# Patient Record
Sex: Female | Born: 1945
Health system: Southern US, Community
[De-identification: ages and names within clinical notes are randomized; demographics above are authoritative.]

## PROBLEM LIST (undated history)

## (undated) DIAGNOSIS — F4322 Adjustment disorder with anxiety: Secondary | ICD-10-CM

## (undated) DIAGNOSIS — J449 Chronic obstructive pulmonary disease, unspecified: Secondary | ICD-10-CM

## (undated) DIAGNOSIS — I2510 Atherosclerotic heart disease of native coronary artery without angina pectoris: Secondary | ICD-10-CM

## (undated) DIAGNOSIS — I10 Essential (primary) hypertension: Secondary | ICD-10-CM

## (undated) DIAGNOSIS — K7469 Other cirrhosis of liver: Secondary | ICD-10-CM

## (undated) DIAGNOSIS — E11610 Type 2 diabetes mellitus with diabetic neuropathic arthropathy: Secondary | ICD-10-CM

## (undated) DIAGNOSIS — R0902 Hypoxemia: Secondary | ICD-10-CM

## (undated) DIAGNOSIS — J45909 Unspecified asthma, uncomplicated: Secondary | ICD-10-CM

## (undated) DIAGNOSIS — I4891 Unspecified atrial fibrillation: Secondary | ICD-10-CM

## (undated) DIAGNOSIS — R002 Palpitations: Secondary | ICD-10-CM

## (undated) DIAGNOSIS — E1165 Type 2 diabetes mellitus with hyperglycemia: Secondary | ICD-10-CM

## (undated) DIAGNOSIS — IMO0001 Reserved for inherently not codable concepts without codable children: Secondary | ICD-10-CM

## (undated) HISTORY — PX: CORONARY ARTERY BYPASS GRAFT: SHX141

## (undated) HISTORY — DX: Reserved for inherently not codable concepts without codable children: IMO0001

## (undated) HISTORY — DX: Type 2 diabetes mellitus with hyperglycemia: E11.65

## (undated) HISTORY — DX: Essential (primary) hypertension: I10

## (undated) HISTORY — DX: Adjustment disorder with anxiety: F43.22

## (undated) HISTORY — DX: Type 2 diabetes mellitus with diabetic neuropathic arthropathy: E11.610

## (undated) HISTORY — PX: TONSILLECTOMY: SUR1361

## (undated) HISTORY — PX: CARDIAC SURGERY: SHX584

---
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
## 2011-07-16 ENCOUNTER — Emergency Department (HOSPITAL_COMMUNITY): Payer: Medicaid Other

## 2011-07-16 ENCOUNTER — Encounter (HOSPITAL_COMMUNITY): Payer: Self-pay | Admitting: *Deleted

## 2011-07-16 ENCOUNTER — Emergency Department (HOSPITAL_COMMUNITY)
Admission: EM | Admit: 2011-07-16 | Discharge: 2011-07-16 | Disposition: A | Discharge status: Home / Self Care | Payer: Medicaid Other | Attending: Emergency Medicine | Admitting: Emergency Medicine

## 2011-07-16 DIAGNOSIS — Z951 Presence of aortocoronary bypass graft: Secondary | ICD-10-CM | POA: Insufficient documentation

## 2011-07-16 DIAGNOSIS — I2510 Atherosclerotic heart disease of native coronary artery without angina pectoris: Secondary | ICD-10-CM | POA: Insufficient documentation

## 2011-07-16 DIAGNOSIS — J45909 Unspecified asthma, uncomplicated: Secondary | ICD-10-CM | POA: Insufficient documentation

## 2011-07-16 DIAGNOSIS — K5289 Other specified noninfective gastroenteritis and colitis: Secondary | ICD-10-CM | POA: Insufficient documentation

## 2011-07-16 DIAGNOSIS — E119 Type 2 diabetes mellitus without complications: Secondary | ICD-10-CM | POA: Insufficient documentation

## 2011-07-16 DIAGNOSIS — I10 Essential (primary) hypertension: Secondary | ICD-10-CM | POA: Insufficient documentation

## 2011-07-16 HISTORY — DX: Unspecified asthma, uncomplicated: J45.909

## 2011-07-16 HISTORY — DX: Palpitations: R00.2

## 2011-07-16 HISTORY — DX: Atherosclerotic heart disease of native coronary artery without angina pectoris: I25.10

## 2011-07-16 LAB — COMPREHENSIVE METABOLIC PANEL
ALT: 120 U/L — ABNORMAL HIGH (ref 0–35)
AST: 93 U/L — ABNORMAL HIGH (ref 0–37)
Albumin: 3.8 g/dL (ref 3.5–5.2)
Alkaline Phosphatase: 87 U/L (ref 39–117)
Calcium: 9.6 mg/dL (ref 8.4–10.5)
GFR calc Af Amer: 79 mL/min — ABNORMAL LOW (ref >90)
Potassium: 4 mEq/L (ref 3.5–5.1)
Sodium: 139 mEq/L (ref 135–145)
Total Protein: 7.8 g/dL (ref 6.0–8.3)

## 2011-07-16 LAB — URINALYSIS, ROUTINE W REFLEX MICROSCOPIC
Bilirubin Urine: NEGATIVE
Ketones, ur: NEGATIVE mg/dL
Leukocytes, UA: NEGATIVE
Nitrite: NEGATIVE
Specific Gravity, Urine: 1.022 (ref 1.005–1.030)
Urobilinogen, UA: 0.2 mg/dL (ref 0.0–1.0)
pH: 5.5 (ref 5.0–8.0)

## 2011-07-16 LAB — CBC
MCH: 26.9 pg (ref 26.0–34.0)
MCHC: 33.7 g/dL (ref 30.0–36.0)
Platelets: 170 10*3/uL (ref 150–400)
RDW: 14.4 % (ref 11.5–15.5)

## 2011-07-16 LAB — URINE MICROSCOPIC-ADD ON

## 2011-07-16 MED ORDER — IOHEXOL 300 MG/ML  SOLN
80.0000 mL | Freq: Once | INTRAMUSCULAR | Status: AC | PRN
  Administered 2011-07-16: 80 mL via INTRAVENOUS

## 2011-07-16 MED ORDER — MORPHINE SULFATE 4 MG/ML IJ SOLN
4.0000 mg | Freq: Once | INTRAMUSCULAR | Status: AC
  Administered 2011-07-16: 4 mg via INTRAVENOUS
  Filled 2011-07-16: qty 1

## 2011-07-16 MED ORDER — ONDANSETRON HCL 4 MG/2ML IJ SOLN
4.0000 mg | Freq: Once | INTRAMUSCULAR | Status: AC
  Administered 2011-07-16: 4 mg via INTRAVENOUS
  Filled 2011-07-16: qty 2

## 2011-07-16 MED ORDER — IOHEXOL 300 MG/ML  SOLN
20.0000 mL | INTRAMUSCULAR | Status: AC
  Administered 2011-07-16: 20 mL via ORAL

## 2011-07-16 MED ORDER — HYDROCODONE-ACETAMINOPHEN 5-325 MG PO TABS: 2.0000 | ORAL_TABLET | ORAL | Status: AC | PRN

## 2011-07-16 NOTE — ED Provider Notes (Signed)
History     CSN: [id number]  Arrival date & time 07/16/11  1142   First MD Initiated Contact with Patient 07/16/11 1219      Chief Complaint  Patient presents with  . Abdominal Pain    (Consider location/radiation/quality/duration/timing/severity/associated sxs/prior treatment) HPI Pt presents with several days of right lower abdominal pain- she describes a constant pain with sharp increases which radiate to her inguinal area on the right.  No fever/chills.  She does say that last night she began to feel pain in her entire abdomen.  No vomiting, but has had decreased appetite.  No similar symptoms in the past.  No changes in stool or dysuria.  She also reports subjective fever intermittently. There are no other associated systemic symptoms, there are no alleviating or modifying factors.    Past Medical History  Diagnosis Date  . Diabetes mellitus   . Hypertension   . Heart palpitations   . Asthma   . Coronary artery disease     Past Surgical History  Procedure Date  . Coronary artery bypass graft   . Tonsillectomy   . Cesarean section     No family history on file.  History  Substance Use Topics  . Smoking status: Never Smoker   . Smokeless tobacco: Not on file  . Alcohol Use: No    OB History    Grav Para Term Preterm Abortions TAB SAB Ect Mult Living                  Review of Systems ROS reviewed and all otherwise negative except for mentioned in HPI  Allergies  Penicillins  Home Medications   Current Outpatient Rx  Name Route Sig Dispense Refill  . ALBUTEROL SULFATE HFA 108 (90 BASE) MCG/ACT IN AERS Inhalation Inhale 2 puffs into the lungs every 6 (six) hours as needed. For wheezing.    Marland Kitchen ALPRAZOLAM 1 MG PO TABS Oral Take 1 mg by mouth 2 (two) times daily as needed. For anxiety.    . B COMPLEX PO Oral Take 1 capsule by mouth daily.    Marland Kitchen LISINOPRIL-HYDROCHLOROTHIAZIDE 20-12.5 MG PO TABS Oral Take 1 tablet by mouth daily.    Marland Kitchen METFORMIN HCL 500 MG PO  TABS Oral Take 500 mg by mouth 2 (two) times daily with a meal.    . NITROGLYCERIN 0.4 MG SL SUBL Sublingual Place 0.4 mg under the tongue every 5 (five) minutes as needed. For chest pain.    Marland Kitchen HYDROCODONE-ACETAMINOPHEN 5-325 MG PO TABS Oral Take 2 tablets by mouth every 4 (four) hours as needed for pain. 15 tablet 0    BP 131/79  Pulse 78  Temp 98.7 F (37.1 C) (Oral)  Resp 17  Ht _0  (1.6 m)  Wt 147 lb (66.679 kg)  BMI 26.04 kg/m2  SpO2 100% Vitals reviewed Physical Exam Physical Examination: General appearance - alert, well appearing, and in no distress Mental status - alert, oriented to person, place, and time Eyes - pupils equal and reactive, no conjunctival injection, no scleral icterus Mouth - mucous membranes moist, pharynx normal without lesions Chest - clear to auscultation, no wheezes, rales or rhonchi, symmetric air entry Heart - normal rate, regular rhythm, normal S1, S2, no murmurs, rubs, clicks or gallops Abdomen - soft, ttp in left mid abdomen, mild ttp in RLQ, no gaurding and no rebound tenderness, tenderness is moderate, nondistended, no masses or organomegaly, nabs Extremities - peripheral pulses normal, no pedal edema, no clubbing or cyanosis  Skin - normal coloration and turgor, no rashes  ED Course  Procedures (including critical care time)  2:43 PM  Pt to be moved to CDU holding while awaiting CT scan.  D/w Gardiner Fanti.  Due to LFTs elevated and mild elevation in lipase- if CT scan is negative pt will likely need abdominal ultrasound  Labs Reviewed  URINALYSIS, ROUTINE W REFLEX MICROSCOPIC - Abnormal; Notable for the following:    Hgb urine dipstick TRACE (*)     All other components within normal limits  COMPREHENSIVE METABOLIC PANEL - Abnormal; Notable for the following:    Glucose, Bld 125 (*)     AST 93 (*)     ALT 120 (*)     GFR calc non Af Amer 68 (*)     GFR calc Af Amer 79 (*)     All other components within normal limits  LIPASE, BLOOD -  Abnormal; Notable for the following:    Lipase 67 (*)     All other components within normal limits  CBC  URINE MICROSCOPIC-ADD ON  LAB REPORT - SCANNED   Ct Abdomen Pelvis W Contrast  07/16/2011  *RADIOLOGY REPORT*  Clinical Data: Abdominal pain.  CT ABDOMEN AND PELVIS WITH CONTRAST  Technique:  Multidetector CT imaging of the abdomen and pelvis was performed following the standard protocol during bolus administration of intravenous contrast.  Contrast: 50m OMNIPAQUE IOHEXOL 300 MG/ML  SOLN  Comparison: None.  Findings: Scattered areas of atelectasis at the lung bases.  Liver appears within normal limits.  Spleen normal.  Gallbladder is within normal limits.  Common bile duct and pancreas appear normal. Stomach normal.  The adrenal glands are normal.  There is a 20 mm x 28 mm left posterior renal angiomyolipoma without complicating features. Ureters appear within normal limits.  Delayed renal excretion is within normal limits. 1 cm and smaller renal cysts are present in the right kidney.  Mild abdominal aortic atherosclerosis.  Small bowel normal.  The appendix is normal.  Urinary bladder is within normal limits.  There are inflammatory changes in the right lower quadrant adjacent to the tip of the appendix however there is no appendiceal fluid or thickening.  This is adjacent to the sigmoid colon, better seen on coronal and sagittal images.  The appearance is most compatible with epiploic appendagitis.  Mesenteric infarct is considered less likely.  No colonic diverticula are identified in this region.  No colonic inflammation.  Physiologic appearance of the uterus and adnexa. No aggressive osseous lesions.  IMPRESSION: 1.  Right lower quadrant inflammatory changes in the fat compatible with epiploic appendagitis.  Mesenteric infarct considered less likely. 2.  Right renal cysts and left interpolar benign 28 mm renal angiomyolipoma.  Original Report Authenticated By: GDereck Ligas M.D.     1.  Epiploic appendagitis       MDM  Pt with RLQ abdominal pain- some left sided tenderness on exam as well. Urine reassuring.  Abdominal CT scan pending- pt placed in CDU holding while awaiting this test.  If this is negative, may need abdominal ultrasound due to elevated LFTS.         MThreasa Beards MD 07/17/11 [phone]

## 2011-07-16 NOTE — ED Notes (Signed)
Patient complains of sharp right lower abd pain that radiates into her leg.  She states the pain causes her to urinate and she also experiencing a fever.  Patient reports the pain has been present for 1 week and has worsened. Patient reports she is nauseated

## 2011-07-16 NOTE — ED Provider Notes (Signed)
She is comfortable after drinking contrast for CT. NAD. Will continue to observe.  CT shows epiploic appendagitis. Feel she can be discharged home with pain measures and follow up with her regular physician.  Discussed with Dr. Maryan Rued who agrees with care plan.  Leotis Shames, PA-C 07/16/11 1723

## 2011-07-16 NOTE — ED Notes (Signed)
Pt aware to not take metformin

## 2011-07-16 NOTE — ED Notes (Signed)
Pt is ambulating to restroom with out difficulties to give a urine sample

## 2011-07-16 NOTE — ED Notes (Signed)
Daughter at bedside has brought pt food. Able to eat without any problems. Pt given print out of information regarding dx. Verbalized understanding.

## 2011-07-17 NOTE — ED Provider Notes (Signed)
Medical screening examination/treatment/procedure(s) were conducted as a shared visit with non-physician practitioner(s) and myself.  I personally evaluated the patient during the encounter  Pt seen primarily by me in the ED  Threasa Beards, MD 07/17/11 [phone]

## 2011-09-09 ENCOUNTER — Other Ambulatory Visit (HOSPITAL_COMMUNITY): Payer: Self-pay | Admitting: Cardiology

## 2011-09-09 DIAGNOSIS — R079 Chest pain, unspecified: Secondary | ICD-10-CM

## 2011-09-22 ENCOUNTER — Other Ambulatory Visit (HOSPITAL_COMMUNITY): Payer: Medicaid Other

## 2012-02-10 ENCOUNTER — Other Ambulatory Visit: Payer: Self-pay | Admitting: Gastroenterology

## 2012-02-10 DIAGNOSIS — R1013 Epigastric pain: Secondary | ICD-10-CM

## 2012-02-16 ENCOUNTER — Other Ambulatory Visit: Payer: Medicaid Other

## 2012-02-22 ENCOUNTER — Ambulatory Visit (HOSPITAL_COMMUNITY)
Admission: RE | Admit: 2012-02-22 | Discharge: 2012-02-22 | Disposition: A | Discharge status: Home / Self Care | Payer: Medicare Other | Source: Ambulatory Visit | Attending: Gastroenterology | Admitting: Gastroenterology

## 2012-02-22 DIAGNOSIS — N289 Disorder of kidney and ureter, unspecified: Secondary | ICD-10-CM | POA: Insufficient documentation

## 2012-02-22 DIAGNOSIS — R1013 Epigastric pain: Secondary | ICD-10-CM | POA: Insufficient documentation

## 2012-02-22 DIAGNOSIS — R7989 Other specified abnormal findings of blood chemistry: Secondary | ICD-10-CM | POA: Insufficient documentation

## 2012-03-06 ENCOUNTER — Encounter (HOSPITAL_COMMUNITY)
Admission: RE | Disposition: A | Discharge status: Hospice | Payer: Self-pay | Source: Ambulatory Visit | Attending: Gastroenterology

## 2012-03-06 ENCOUNTER — Ambulatory Visit (HOSPITAL_COMMUNITY)
Admission: RE | Admit: 2012-03-06 | Discharge: 2012-03-06 | Disposition: A | Discharge status: Hospice | Payer: Medicare Other | Source: Ambulatory Visit | Attending: Gastroenterology | Admitting: Gastroenterology

## 2012-03-06 DIAGNOSIS — R1310 Dysphagia, unspecified: Secondary | ICD-10-CM | POA: Insufficient documentation

## 2012-03-06 DIAGNOSIS — K224 Dyskinesia of esophagus: Secondary | ICD-10-CM | POA: Insufficient documentation

## 2012-03-06 HISTORY — PX: ESOPHAGEAL MANOMETRY: SHX5429

## 2012-03-06 SURGERY — MANOMETRY, ESOPHAGUS

## 2012-03-06 MED ORDER — LIDOCAINE VISCOUS 2 % MT SOLN
OROMUCOSAL | Status: AC
  Filled 2012-03-06: qty 15

## 2012-03-07 ENCOUNTER — Encounter (HOSPITAL_COMMUNITY): Payer: Self-pay | Admitting: Gastroenterology

## 2012-03-08 NOTE — H&P (Signed)
  Sharon Spears HPI: This is a 67 year old female with complaints of dysphagia.  Work up with an EGD with biopsies of the esophagus were negative for any evidence of EoE, esophagitis, or strictures.  Past Medical History  Diagnosis Date  . Diabetes mellitus   . Hypertension   . Heart palpitations   . Asthma   . Coronary artery disease     Past Surgical History  Procedure Laterality Date  . Coronary artery bypass graft    . Tonsillectomy    . Cesarean section    . Esophageal manometry N/A 03/06/2012    Procedure: ESOPHAGEAL MANOMETRY (EM);  Surgeon: Beryle Beams, MD;  Location: [facility];  Service: Endoscopy;  Laterality: N/A;    No family history on file.  Social History:  reports that she has never smoked. She does not have any smokeless tobacco history on file. She reports that she does not drink alcohol or use illicit drugs.  Allergies:  Allergies  Allergen Reactions  . Penicillins Other (See Comments)    GI upset    Medications: Scheduled: Continuous:  No results found for this or any previous visit (from the past 24 hour(s)).   No results found.  ROS:  As stated above in the HPI otherwise negative.  Height _0  (1.6 m).    PE: Gen: NAD, Alert and Oriented HEENT:  /AT, EOMI Neck: Supple, no LAD Lungs: CTA Bilaterally CV: RRR without M/G/R ABM: Soft, NTND, +BS Ext: No C/C/E  Assessment/Plan: 1) Dysphagia.  Plan: 1) Manometry.  Keyna Blizard D 03/08/2012, 8:06 AM

## 2012-05-29 ENCOUNTER — Ambulatory Visit
Admission: RE | Admit: 2012-05-29 | Discharge: 2012-05-29 | Disposition: A | Discharge status: Home / Self Care | Payer: Medicare Other | Source: Ambulatory Visit | Attending: Cardiology | Admitting: Cardiology

## 2012-05-29 ENCOUNTER — Other Ambulatory Visit: Payer: Self-pay | Admitting: Cardiology

## 2012-05-29 DIAGNOSIS — N644 Mastodynia: Secondary | ICD-10-CM

## 2012-06-09 ENCOUNTER — Other Ambulatory Visit: Payer: Medicare Other

## 2012-07-10 ENCOUNTER — Other Ambulatory Visit: Payer: Self-pay | Admitting: Neurosurgery

## 2012-07-10 DIAGNOSIS — M549 Dorsalgia, unspecified: Secondary | ICD-10-CM

## 2012-07-13 ENCOUNTER — Ambulatory Visit
Admission: RE | Admit: 2012-07-13 | Discharge: 2012-07-13 | Disposition: A | Discharge status: Home / Self Care | Payer: Medicare Other | Source: Ambulatory Visit | Attending: Neurosurgery | Admitting: Neurosurgery

## 2012-07-13 DIAGNOSIS — M549 Dorsalgia, unspecified: Secondary | ICD-10-CM

## 2013-01-16 ENCOUNTER — Encounter (HOSPITAL_COMMUNITY): Payer: Self-pay | Admitting: Emergency Medicine

## 2013-01-16 ENCOUNTER — Emergency Department (INDEPENDENT_AMBULATORY_CARE_PROVIDER_SITE_OTHER)
Admission: EM | Admit: 2013-01-16 | Discharge: 2013-01-16 | Disposition: A | Discharge status: Home / Self Care | Payer: PRIVATE HEALTH INSURANCE | Source: Home / Self Care | Attending: Family Medicine | Admitting: Family Medicine

## 2013-01-16 DIAGNOSIS — H109 Unspecified conjunctivitis: Secondary | ICD-10-CM

## 2013-01-16 DIAGNOSIS — J329 Chronic sinusitis, unspecified: Secondary | ICD-10-CM

## 2013-01-16 MED ORDER — POLYMYXIN B-TRIMETHOPRIM 10000-0.1 UNIT/ML-% OP SOLN: 1.0000 [drp] | OPHTHALMIC | Status: DC

## 2013-01-16 MED ORDER — FLUTICASONE PROPIONATE 50 MCG/ACT NA SUSP: 2.0000 | Freq: Two times a day (BID) | NASAL | Status: DC

## 2013-01-16 MED ORDER — LEVOFLOXACIN 500 MG PO TABS
500.0000 mg | ORAL_TABLET | Freq: Every day | ORAL | Status: DC
Start: 2013-01-16 — End: 2013-03-05

## 2013-01-16 MED ORDER — LEVOFLOXACIN 500 MG PO TABS: 500.0000 mg | ORAL_TABLET | Freq: Every day | ORAL | Status: DC

## 2013-01-16 NOTE — ED Provider Notes (Signed)
CSN: [id number]     Arrival date & time 01/16/13  1344 History   First MD Initiated Contact with Patient 01/16/13 1612     Chief Complaint  Patient presents with  . Eye Drainage   (Consider location/radiation/quality/duration/timing/severity/associated sxs/prior Treatment) HPI Comments: 89fpresents c/o 5 days of sinus pressure, nasal congestion, bloody purulent drainage.  Red irritated discharging eyes for 2 days.  She is not in any pain at this time and does not feel she is sick. She is having some sinus pressure and says she is "plagued by chronic sinus infections." She is using her neti pot without relief.   Past Medical History  Diagnosis Date  . Diabetes mellitus   . Hypertension   . Heart palpitations   . Asthma   . Coronary artery disease    Past Surgical History  Procedure Laterality Date  . Coronary artery bypass graft    . Tonsillectomy    . Cesarean section    . Esophageal manometry N/A 03/06/2012    Procedure: ESOPHAGEAL MANOMETRY (EM);  Surgeon: PBeryle Beams MD;  Location: [facility];  Service: Endoscopy;  Laterality: N/A;   History reviewed. No pertinent family history. History  Substance Use Topics  . Smoking status: Never Smoker   . Smokeless tobacco: Not on file  . Alcohol Use: No   OB History   Grav Para Term Preterm Abortions TAB SAB Ect Mult Living                 Review of Systems  Constitutional: Negative for fever and chills.  HENT: Positive for congestion, rhinorrhea and sinus pressure.   Eyes: Positive for pain, discharge and redness. Negative for visual disturbance.  Respiratory: Negative for cough and shortness of breath.   Cardiovascular: Negative for chest pain, palpitations and leg swelling.  Gastrointestinal: Negative for nausea, vomiting and abdominal pain.  Endocrine: Negative for polydipsia and polyuria.  Genitourinary: Negative for dysuria, urgency and frequency.  Musculoskeletal: Positive for neck pain. Negative for arthralgias  and myalgias.  Skin: Negative for rash.  Neurological: Negative for dizziness, weakness and light-headedness.    Allergies  Penicillins  Home Medications   Current Outpatient Rx  Name  Route  Sig  Dispense  Refill  . albuterol (PROVENTIL HFA;VENTOLIN HFA) 108 (90 BASE) MCG/ACT inhaler   Inhalation   Inhale 2 puffs into the lungs every 6 (six) hours as needed. For wheezing.         .Marland KitchenALPRAZolam (XANAX) 1 MG tablet   Oral   Take 1 mg by mouth 2 (two) times daily as needed. For anxiety.         . B Complex Vitamins (B COMPLEX PO)   Oral   Take 1 capsule by mouth daily.         . fluticasone (FLONASE) 50 MCG/ACT nasal spray   Each Nare   Place 2 sprays into both nostrils 2 (two) times daily.   1 g   2   . levofloxacin (LEVAQUIN) 500 MG tablet   Oral   Take 1 tablet (500 mg total) by mouth daily.   7 tablet   0   . lisinopril-hydrochlorothiazide (PRINZIDE,ZESTORETIC) 20-12.5 MG per tablet   Oral   Take 1 tablet by mouth daily.         . metFORMIN (GLUCOPHAGE) 500 MG tablet   Oral   Take 500 mg by mouth 2 (two) times daily with a meal.         .  nitroGLYCERIN (NITROSTAT) 0.4 MG SL tablet   Sublingual   Place 0.4 mg under the tongue every 5 (five) minutes as needed. For chest pain.         Marland Kitchen trimethoprim-polymyxin b (POLYTRIM) ophthalmic solution   Both Eyes   Place 1 drop into both eyes every 4 (four) hours.   10 mL   0    BP 127/58  Pulse 88  Temp(Src) 97.8 F (36.6 C) (Oral)  Resp 18  SpO2 94% Physical Exam  Nursing note and vitals reviewed. Constitutional: She is oriented to person, place, and time. Vital signs are normal. She appears well-developed and well-nourished. No distress.  HENT:  Head: Normocephalic and atraumatic.  Right Ear: Hearing, tympanic membrane, external ear and ear canal normal.  Left Ear: Hearing, tympanic membrane, external ear and ear canal normal.  Nose: Right sinus exhibits maxillary sinus tenderness and frontal  sinus tenderness. Left sinus exhibits maxillary sinus tenderness and frontal sinus tenderness.  Mouth/Throat: Uvula is midline and oropharynx is clear and moist. No oropharyngeal exudate.  Eyes: Right eye exhibits discharge. Left eye exhibits discharge. Right conjunctiva is injected. Left conjunctiva is injected.  Neck: Normal range of motion. Neck supple.  Cardiovascular: Normal rate, regular rhythm and normal heart sounds.  Exam reveals no gallop and no friction rub.   No murmur heard. Pulmonary/Chest: Effort normal and breath sounds normal. No respiratory distress. She has no wheezes. She has no rales.  Lymphadenopathy:    She has cervical adenopathy ( tonsillar, posterior cervical).  Neurological: She is alert and oriented to person, place, and time. She has normal strength. Coordination normal.  Skin: Skin is warm and dry. No rash noted. She is not diaphoretic.  Psychiatric: She has a normal mood and affect. Judgment normal.    ED Course  Procedures (including critical care time) Labs Review Labs Reviewed - No data to display Imaging Review No results found.    MDM   1. Sinusitis   2. Conjunctivitis    Afebrile, Nontoxic. Oxygen saturation 98% on recheck. Unclear if this is sinusitis, viral syndrome, or sinusitis plus conjunctivitis.  Treating for both, F/U if any worsening.     Meds ordered this encounter  Medications  . trimethoprim-polymyxin b (POLYTRIM) ophthalmic solution    Sig: Place 1 drop into both eyes every 4 (four) hours.    Dispense:  10 mL    Refill:  0    Order Specific Question:  Supervising Provider    Answer:  Lynne Leader, [place]  . levofloxacin (LEVAQUIN) 500 MG tablet    Sig: Take 1 tablet (500 mg total) by mouth daily.    Dispense:  7 tablet    Refill:  0    Order Specific Question:  Supervising Provider    Answer:  Lynne Leader, [place]  . fluticasone (FLONASE) 50 MCG/ACT nasal spray    Sig: Place 2 sprays into both nostrils 2 (two) times  daily.    Dispense:  1 g    Refill:  2    Order Specific Question:  Supervising Provider    Answer:  Lynne Leader, [place]     Liam Graham, PA-C 01/16/13 1655

## 2013-01-16 NOTE — ED Notes (Signed)
Pt reports having redness, discharge, and burning sensation in both eyes.

## 2013-01-16 NOTE — Discharge Instructions (Signed)
Sinusitis Sinusitis is redness, soreness, and swelling (inflammation) of the paranasal sinuses. Paranasal sinuses are air pockets within the bones of your face (beneath the eyes, the middle of the forehead, or above the eyes). In healthy paranasal sinuses, mucus is able to drain out, and air is able to circulate through them by way of your nose. However, when your paranasal sinuses are inflamed, mucus and air can become trapped. This can allow bacteria and other germs to grow and cause infection. Sinusitis can develop quickly and last only a short time (acute) or continue over a long period (chronic). Sinusitis that lasts for more than 12 weeks is considered chronic.  CAUSES  Causes of sinusitis include:  Allergies.  Structural abnormalities, such as displacement of the cartilage that separates your nostrils (deviated septum), which can decrease the air flow through your nose and sinuses and affect sinus drainage.  Functional abnormalities, such as when the small hairs (cilia) that line your sinuses and help remove mucus do not work properly or are not present. SYMPTOMS  Symptoms of acute and chronic sinusitis are the same. The primary symptoms are pain and pressure around the affected sinuses. Other symptoms include:  Upper toothache.  Earache.  Headache.  Bad breath.  Decreased sense of smell and taste.  A cough, which worsens when you are lying flat.  Fatigue.  Fever.  Thick drainage from your nose, which often is green and may contain pus (purulent).  Swelling and warmth over the affected sinuses. DIAGNOSIS  Your caregiver will perform a physical exam. During the exam, your caregiver may:  Look in your nose for signs of abnormal growths in your nostrils (nasal polyps).  Tap over the affected sinus to check for signs of infection.  View the inside of your sinuses (endoscopy) with a special imaging device with a light attached (endoscope), which is inserted into your  sinuses. If your caregiver suspects that you have chronic sinusitis, one or more of the following tests may be recommended:  Allergy tests.  Nasal culture A sample of mucus is taken from your nose and sent to a lab and screened for bacteria.  Nasal cytology A sample of mucus is taken from your nose and examined by your caregiver to determine if your sinusitis is related to an allergy. TREATMENT  Most cases of acute sinusitis are related to a viral infection and will resolve on their own within 10 days. Sometimes medicines are prescribed to help relieve symptoms (pain medicine, decongestants, nasal steroid sprays, or saline sprays).  However, for sinusitis related to a bacterial infection, your caregiver will prescribe antibiotic medicines. These are medicines that will help kill the bacteria causing the infection.  Rarely, sinusitis is caused by a fungal infection. In theses cases, your caregiver will prescribe antifungal medicine. For some cases of chronic sinusitis, surgery is needed. Generally, these are cases in which sinusitis recurs more than 3 times per year, despite other treatments. HOME CARE INSTRUCTIONS   Drink plenty of water. Water helps thin the mucus so your sinuses can drain more easily.  Use a humidifier.  Inhale steam 3 to 4 times a day (for example, sit in the bathroom with the shower running).  Apply a warm, moist washcloth to your face 3 to 4 times a day, or as directed by your caregiver.  Use saline nasal sprays to help moisten and clean your sinuses.  Take over-the-counter or prescription medicines for pain, discomfort, or fever only as directed by your caregiver. [place]  CARE IF:  You have increasing pain or severe headaches.  You have nausea, vomiting, or drowsiness.  You have swelling around your face.  You have vision problems.  You have a stiff neck.  You have difficulty breathing. MAKE SURE YOU:   Understand these  instructions.  Will watch your condition.  Will get help right away if you are not doing well or get worse. Document Released: 12/28/2004 Document Revised: 03/22/2011 Document Reviewed: 01/12/2011 [facility] Patient Information 2014 [place], [place].  Bacterial Conjunctivitis Bacterial conjunctivitis, commonly called pink eye, is an inflammation of the clear membrane that covers the white part of the eye (conjunctiva). The inflammation can also happen on the underside of the eyelids. The blood vessels in the conjunctiva become inflamed causing the eye to become red or pink. Bacterial conjunctivitis may spread easily from one eye to another and from person to person (contagious).  CAUSES  Bacterial conjunctivitis is caused by bacteria. The bacteria may come from your own skin, your upper respiratory tract, or from someone else with bacterial conjunctivitis. SYMPTOMS  The normally white color of the eye or the underside of the eyelid is usually pink or red. The pink eye is usually associated with irritation, tearing, and some sensitivity to light. Bacterial conjunctivitis is often associated with a thick, yellowish discharge from the eye. The discharge may turn into a crust on the eyelids overnight, which causes your eyelids to stick together. If a discharge is present, there may also be some blurred vision in the affected eye. DIAGNOSIS  Bacterial conjunctivitis is diagnosed by your caregiver through an eye exam and the symptoms that you report. Your caregiver looks for changes in the surface tissues of your eyes, which may point to the specific type of conjunctivitis. A sample of any discharge may be collected on a cotton-tip swab if you have a severe case of conjunctivitis, if your cornea is affected, or if you keep getting repeat infections that do not respond to treatment. The sample will be sent to a lab to see if the inflammation is caused by a bacterial infection and to see if the infection will  respond to antibiotic medicines. TREATMENT   Bacterial conjunctivitis is treated with antibiotics. Antibiotic eyedrops are most often used. However, antibiotic ointments are also available. Antibiotics pills are sometimes used. Artificial tears or eye washes may ease discomfort. HOME CARE INSTRUCTIONS   To ease discomfort, apply a cool, clean wash cloth to your eye for 10 20 minutes, 3 4 times a day.  Gently wipe away any drainage from your eye with a warm, wet washcloth or a cotton ball.  Wash your hands often with soap and water. Use paper towels to dry your hands.  Do not share towels or wash cloths. This may spread the infection.  Change or wash your pillow case every day.  You should not use eye makeup until the infection is gone.  Do not operate machinery or drive if your vision is blurred.  Stop using contacts lenses. Ask your caregiver how to sterilize or replace your contacts before using them again. This depends on the type of contact lenses that you use.  When applying medicine to the infected eye, do not touch the edge of your eyelid with the eyedrop bottle or ointment tube. SEEK IMMEDIATE MEDICAL CARE IF:   Your infection has not improved within 3 days after beginning treatment.  You had yellow discharge from your eye and it returns.  You have increased eye pain.  Your  eye redness is spreading.  Your vision becomes blurred.  You have a fever or persistent symptoms for more than 2 3 days.  You have a fever and your symptoms suddenly get worse.  You have facial pain, redness, or swelling. MAKE SURE YOU:   Understand these instructions.  Will watch your condition.  Will get help right away if you are not doing well or get worse. Document Released: 12/28/2004 Document Revised: 09/22/2011 Document Reviewed: 05/31/2011 [facility] Patient Information 2014 [place], [place].

## 2013-01-17 NOTE — ED Provider Notes (Signed)
Medical screening examination/treatment/procedure(s) were performed by a resident physician or non-physician practitioner and as the supervising physician I was immediately available for consultation/collaboration.  Lynne Leader, MD    Gregor Hams, MD 01/17/13 0800

## 2013-03-05 ENCOUNTER — Inpatient Hospital Stay (HOSPITAL_COMMUNITY)
Admission: AD | Admit: 2013-03-05 | Discharge: 2013-03-05 | Disposition: A | Discharge status: Home / Self Care | Payer: PRIVATE HEALTH INSURANCE | Source: Ambulatory Visit | Attending: Obstetrics and Gynecology | Admitting: Obstetrics and Gynecology

## 2013-03-05 ENCOUNTER — Encounter (HOSPITAL_COMMUNITY): Payer: Self-pay

## 2013-03-05 DIAGNOSIS — I2510 Atherosclerotic heart disease of native coronary artery without angina pectoris: Secondary | ICD-10-CM | POA: Insufficient documentation

## 2013-03-05 DIAGNOSIS — R002 Palpitations: Secondary | ICD-10-CM | POA: Insufficient documentation

## 2013-03-05 DIAGNOSIS — I10 Essential (primary) hypertension: Secondary | ICD-10-CM | POA: Insufficient documentation

## 2013-03-05 DIAGNOSIS — J45909 Unspecified asthma, uncomplicated: Secondary | ICD-10-CM | POA: Insufficient documentation

## 2013-03-05 DIAGNOSIS — B354 Tinea corporis: Secondary | ICD-10-CM

## 2013-03-05 LAB — URINALYSIS, ROUTINE W REFLEX MICROSCOPIC
Bilirubin Urine: NEGATIVE
GLUCOSE, UA: NEGATIVE mg/dL
KETONES UR: NEGATIVE mg/dL
LEUKOCYTES UA: NEGATIVE
Nitrite: NEGATIVE
PROTEIN: NEGATIVE mg/dL
Specific Gravity, Urine: 1.02 (ref 1.005–1.030)
UROBILINOGEN UA: 0.2 mg/dL (ref 0.0–1.0)
pH: 5.5 (ref 5.0–8.0)

## 2013-03-05 LAB — POCT PREGNANCY, URINE: Preg Test, Ur: NEGATIVE

## 2013-03-05 LAB — URINE MICROSCOPIC-ADD ON

## 2013-03-05 MED ORDER — CLOTRIMAZOLE 1 % EX CREA: 1.0000 "application " | TOPICAL_CREAM | Freq: Two times a day (BID) | CUTANEOUS | Status: DC

## 2013-03-05 NOTE — MAU Note (Signed)
Pt presents with complaints of having a rash on her vaginal area for about a month and she says that she has used destin to try and clear it up but it hasnt worked.

## 2013-03-05 NOTE — MAU Provider Note (Signed)
Attestation of Attending Supervision of Advanced Practitioner (CNM/NP): Evaluation and management procedures were performed by the Advanced Practitioner under my supervision and collaboration.  I have reviewed the Advanced Practitioner's note and chart, and I agree with the management and plan.  Kayce Chismar 03/05/2013 2:24 PM

## 2013-03-05 NOTE — MAU Provider Note (Signed)
History     CSN: [id number]  Arrival date and time: 03/05/13 1253   First Provider Initiated Contact with Patient 03/05/13 1306      Chief Complaint  Patient presents with  . Rash   HPI This is a 68 y.o. female who presents with c/o rash on left groin for about a month.  Has tried using creams and powder at home, but they have not helped. Does have a history of diabetes   RN Note:  Pt presents with complaints of having a rash on her vaginal area for about a month and she says that she has used destin to try and clear it up but it hasnt worked.       OB History   Grav Para Term Preterm Abortions TAB SAB Ect Mult Living                  Past Medical History  Diagnosis Date  . Diabetes mellitus   . Hypertension   . Heart palpitations   . Asthma   . Coronary artery disease     Past Surgical History  Procedure Laterality Date  . Coronary artery bypass graft    . Tonsillectomy    . Cesarean section    . Esophageal manometry N/A 03/06/2012    Procedure: ESOPHAGEAL MANOMETRY (EM);  Surgeon: Beryle Beams, MD;  Location: [facility];  Service: Endoscopy;  Laterality: N/A;    No family history on file.  History  Substance Use Topics  . Smoking status: Never Smoker   . Smokeless tobacco: Not on file  . Alcohol Use: No    Allergies:  Allergies  Allergen Reactions  . Penicillins Other (See Comments)    GI upset    Prescriptions prior to admission  Medication Sig Dispense Refill  . albuterol (PROVENTIL HFA;VENTOLIN HFA) 108 (90 BASE) MCG/ACT inhaler Inhale 2 puffs into the lungs every 6 (six) hours as needed. For wheezing.      Marland Kitchen ALPRAZolam (XANAX) 1 MG tablet Take 1 mg by mouth 2 (two) times daily as needed. For anxiety.      . B Complex Vitamins (B COMPLEX PO) Take 1 capsule by mouth daily.      . fluticasone (FLONASE) 50 MCG/ACT nasal spray Place 2 sprays into both nostrils 2 (two) times daily.  1 g  2  . levofloxacin (LEVAQUIN) 500 MG tablet Take 1 tablet  (500 mg total) by mouth daily.  7 tablet  0  . lisinopril-hydrochlorothiazide (PRINZIDE,ZESTORETIC) 20-12.5 MG per tablet Take 1 tablet by mouth daily.      . metFORMIN (GLUCOPHAGE) 500 MG tablet Take 500 mg by mouth 2 (two) times daily with a meal.      . nitroGLYCERIN (NITROSTAT) 0.4 MG SL tablet Place 0.4 mg under the tongue every 5 (five) minutes as needed. For chest pain.      Marland Kitchen trimethoprim-polymyxin b (POLYTRIM) ophthalmic solution Place 1 drop into both eyes every 4 (four) hours.  10 mL  0    Review of Systems  Constitutional: Negative for fever and malaise/fatigue.  Gastrointestinal: Negative for nausea, vomiting and abdominal pain.  Skin: Positive for rash (red, irritated rash on left groin, a little bit on right. ).   Physical Exam   Blood pressure 134/69, pulse 78, temperature 99.1 F (37.3 C), resp. rate 18, height _0  (1.499 m), weight 70.308 kg (155 lb).  Physical Exam  Constitutional: She is oriented to person, place, and time. She appears well-developed and well-nourished.  No distress.  HENT:  Head: Normocephalic.  Cardiovascular: Normal rate.   Respiratory: Effort normal.  GI: Soft. There is no tenderness.  Musculoskeletal: Normal range of motion.  Neurological: She is alert and oriented to person, place, and time.  Skin: Skin is warm and dry. Rash noted.  Red raised macular rash, flaky on left groin about 3x5cm Smaller erethematous rash on right groing C/W Tinea  Psychiatric: She has a normal mood and affect.    MAU Course  Procedures   Assessment and Plan  A:  TInea Corporis  P:  Discussed findings and diagnosis       Rx Clotrimazole cream       Follow up with family doctor     [facility] 03/05/2013, 1:07 PM

## 2013-03-05 NOTE — Discharge Instructions (Signed)
Body Ringworm Ringworm (tinea corporis) is a fungal infection of the skin on the body. This infection is not caused by worms, but is actually caused by a fungus. Fungus normally lives on the top of your skin and can be useful. However, in the case of ringworms, the fungus grows out of control and causes a skin infection. It can involve any area of skin on the body and can spread easily from one person to another (contagious). Ringworm is a common problem for children, but it can affect adults as well. Ringworm is also often found in athletes, especially wrestlers who share equipment and mats.  CAUSES  Ringworm of the body is caused by a fungus called dermatophyte. It can spread by:  Touchingother people who are infected.  Touchinginfected pets.  Touching or sharingobjects that have been in contact with the infected person or pet (hats, combs, towels, clothing, sports equipment). SYMPTOMS   Itchy, raised red spots and bumps on the skin.  Ring-shaped rash.  Redness near the border of the rash with a clear center.  Dry and scaly skin on or around the rash. Not every person develops a ring-shaped rash. Some develop only the red, scaly patches. DIAGNOSIS  Most often, ringworm can be diagnosed by performing a skin exam. Your caregiver may choose to take a skin scraping from the affected area. The sample will be examined under the microscope to see if the fungus is present.  TREATMENT  Body ringworm may be treated with a topical antifungal cream or ointment. Sometimes, an antifungal shampoo that can be used on your body is prescribed. You may be prescribed antifungal medicines to take by mouth if your ringworm is severe, keeps coming back, or lasts a long time.  HOME CARE INSTRUCTIONS   Only take over-the-counter or prescription medicines as directed by your caregiver.  Wash the infected area and dry it completely before applying yourcream or ointment.  When using antifungal shampoo to  treat the ringworm, leave the shampoo on the body for 3 5 minutes before rinsing.   Wear loose clothing to stop clothes from rubbing and irritating the rash.  Wash or change your bed sheets every night while you have the rash.  Have your pet treated by your veterinarian if it has the same infection. To prevent ringworm:   Practice good hygiene.  Wear sandals or shoes in public places and showers.  Do not share personal items with others.  Avoid touching red patches of skin on other people.  Avoid touching pets that have bald spots or wash your hands after doing so. SEEK MEDICAL CARE IF:   Your rash continues to spread after 7 days of treatment.  Your rash is not gone in 4 weeks.  The area around your rash becomes red, warm, tender, and swollen. Document Released: 12/26/1999 Document Revised: 09/22/2011 Document Reviewed: 07/12/2011 [facility] Patient Information 2014 [place].

## 2013-05-07 ENCOUNTER — Encounter (HOSPITAL_COMMUNITY): Payer: Self-pay | Admitting: Emergency Medicine

## 2013-05-07 ENCOUNTER — Emergency Department (INDEPENDENT_AMBULATORY_CARE_PROVIDER_SITE_OTHER)
Admission: EM | Admit: 2013-05-07 | Discharge: 2013-05-07 | Disposition: A | Discharge status: Home / Self Care | Payer: PRIVATE HEALTH INSURANCE | Source: Home / Self Care

## 2013-05-07 ENCOUNTER — Emergency Department (HOSPITAL_COMMUNITY)
Admission: EM | Admit: 2013-05-07 | Discharge: 2013-05-07 | Discharge status: Against Medical Advice | Payer: PRIVATE HEALTH INSURANCE | Attending: Emergency Medicine | Admitting: Emergency Medicine

## 2013-05-07 DIAGNOSIS — I10 Essential (primary) hypertension: Secondary | ICD-10-CM | POA: Diagnosis not present

## 2013-05-07 DIAGNOSIS — J029 Acute pharyngitis, unspecified: Secondary | ICD-10-CM

## 2013-05-07 DIAGNOSIS — I2510 Atherosclerotic heart disease of native coronary artery without angina pectoris: Secondary | ICD-10-CM | POA: Diagnosis not present

## 2013-05-07 DIAGNOSIS — R0982 Postnasal drip: Secondary | ICD-10-CM

## 2013-05-07 DIAGNOSIS — Z951 Presence of aortocoronary bypass graft: Secondary | ICD-10-CM | POA: Insufficient documentation

## 2013-05-07 DIAGNOSIS — E119 Type 2 diabetes mellitus without complications: Secondary | ICD-10-CM | POA: Insufficient documentation

## 2013-05-07 DIAGNOSIS — J45909 Unspecified asthma, uncomplicated: Secondary | ICD-10-CM | POA: Diagnosis not present

## 2013-05-07 DIAGNOSIS — J3489 Other specified disorders of nose and nasal sinuses: Secondary | ICD-10-CM | POA: Diagnosis present

## 2013-05-07 DIAGNOSIS — K219 Gastro-esophageal reflux disease without esophagitis: Secondary | ICD-10-CM

## 2013-05-07 LAB — POCT RAPID STREP A: STREPTOCOCCUS, GROUP A SCREEN (DIRECT): NEGATIVE

## 2013-05-07 NOTE — ED Provider Notes (Signed)
CSN: [id number]     Arrival date & time 05/07/13  1027 History   First MD Initiated Contact with Patient 05/07/13 1136     Chief Complaint  Patient presents with  . Sore Throat   (Consider location/radiation/quality/duration/timing/severity/associated sxs/prior Treatment) HPI Comments: 68 year old female complaining of sore throat and painful swallowing. This started at 8 PM last night. She also has GERD and partly attributed this to reflux symptoms. Additional symptoms include nasal congestion and PND. She complains of burning type pain in her throat.   Past Medical History  Diagnosis Date  . Diabetes mellitus   . Hypertension   . Heart palpitations   . Asthma   . Coronary artery disease    Past Surgical History  Procedure Laterality Date  . Coronary artery bypass graft    . Tonsillectomy    . Cesarean section    . Esophageal manometry N/A 03/06/2012    Procedure: ESOPHAGEAL MANOMETRY (EM);  Surgeon: Beryle Beams, MD;  Location: [facility];  Service: Endoscopy;  Laterality: N/A;   History reviewed. No pertinent family history. History  Substance Use Topics  . Smoking status: Never Smoker   . Smokeless tobacco: Not on file  . Alcohol Use: No   OB History   Grav Para Term Preterm Abortions TAB SAB Ect Mult Living                 Review of Systems  Constitutional: Positive for fever and appetite change. Negative for fatigue.  HENT: Positive for congestion, postnasal drip, rhinorrhea and sore throat. Negative for ear pain and facial swelling.   Eyes: Negative.   Respiratory: Negative.   Cardiovascular: Negative.   Gastrointestinal:       As per history of present illness    Allergies  Penicillins  Home Medications   Prior to Admission medications   Medication Sig Start Date End Date Taking? Authorizing Provider  ALPRAZolam Duanne Moron) 1 MG tablet Take 1 mg by mouth 2 (two) times daily as needed. For anxiety.   Yes Historical Provider, MD  B Complex Vitamins (B  COMPLEX PO) Take 1 capsule by mouth daily.   Yes Historical Provider, MD  lisinopril-hydrochlorothiazide (PRINZIDE,ZESTORETIC) 20-12.5 MG per tablet Take 1 tablet by mouth daily.   Yes Historical Provider, MD  metFORMIN (GLUCOPHAGE) 500 MG tablet Take 500 mg by mouth 2 (two) times daily with a meal.   Yes Historical Provider, MD  metoprolol-hydrochlorothiazide (LOPRESSOR HCT) 50-25 MG per tablet Take 1 tablet by mouth daily.   Yes Historical Provider, MD  omeprazole (PRILOSEC) 40 MG capsule Take 40 mg by mouth daily.   Yes Historical Provider, MD  albuterol (PROVENTIL HFA;VENTOLIN HFA) 108 (90 BASE) MCG/ACT inhaler Inhale 2 puffs into the lungs every 6 (six) hours as needed. For wheezing.    Historical Provider, MD  clotrimazole (LOTRIMIN) 1 % cream Apply 1 application topically 2 (two) times daily. 03/05/13   Seabron Spates, CNM  fluticasone (FLONASE) 50 MCG/ACT nasal spray Place 2 sprays into both nostrils 2 (two) times daily. 01/16/13   Liam Graham, PA-C  nitroGLYCERIN (NITROSTAT) 0.4 MG SL tablet Place 0.4 mg under the tongue every 5 (five) minutes as needed. For chest pain.    Historical Provider, MD   BP 148/75  Pulse 70  Temp(Src) 98.2 F (36.8 C) (Oral)  Resp 14  SpO2 95% Physical Exam  Vitals reviewed. Constitutional: She is oriented to person, place, and time. She appears well-developed. No distress.  HENT:  Right Ear:  External ear normal.  Left Ear: External ear normal.  Mouth/Throat: No oropharyngeal exudate.  Oropharynx minor injection and clear PND.  Eyes: Conjunctivae and EOM are normal.  Neck: Normal range of motion. Neck supple.  Cardiovascular: Normal rate, regular rhythm and normal heart sounds.   Pulmonary/Chest: Effort normal and breath sounds normal. No respiratory distress. She has no wheezes. She has no rales.  Musculoskeletal: She exhibits no edema.  Lymphadenopathy:    She has no cervical adenopathy.  Neurological: She is alert and oriented to person,  place, and time.  Skin: Skin is warm and dry. No rash noted.  Psychiatric: She has a normal mood and affect.    ED Course  Procedures (including critical care time) Labs Review Labs Reviewed  POCT RAPID STREP A ([facility]RG CARE ONLY)    Imaging Review No results found. Results for orders placed during the hospital encounter of 05/07/13  POCT RAPID STREP A ([facility] CARE ONLY)      Result Value Ref Range   Streptococcus, Group A Screen (Direct) NEGATIVE  NEGATIVE     MDM   1. PND (post-nasal drip)   2. Allergic pharyngitis   3. GERD (gastroesophageal reflux disease)     cepacol loz Tylenol Fluids Allegra flonase  For Gerd add H2 blocker to Nexium at night time    Janne Napoleon, NP 05/07/13 1200

## 2013-05-07 NOTE — Discharge Instructions (Signed)
Pharyngitis For drainage Allegra 60 mg twice a day Flonase nasal spray as directed Cepacol throat lozenges Increase fluids, cool liquids Tylenol for pain Pharyngitis is redness, pain, and swelling (inflammation) of your pharynx.  CAUSES  Pharyngitis is usually caused by infection. Most of the time, these infections are from viruses (viral) and are part of a cold. However, sometimes pharyngitis is caused by bacteria (bacterial). Pharyngitis can also be caused by allergies. Viral pharyngitis may be spread from person to person by coughing, sneezing, and personal items or utensils (cups, forks, spoons, toothbrushes). Bacterial pharyngitis may be spread from person to person by more intimate contact, such as kissing.  SIGNS AND SYMPTOMS  Symptoms of pharyngitis include:   Sore throat.   Tiredness (fatigue).   Low-grade fever.   Headache.  Joint pain and muscle aches.  Skin rashes.  Swollen lymph nodes.  Plaque-like film on throat or tonsils (often seen with bacterial pharyngitis). DIAGNOSIS  Your health care provider will ask you questions about your illness and your symptoms. Your medical history, along with a physical exam, is often all that is needed to diagnose pharyngitis. Sometimes, a rapid strep test is done. Other lab tests may also be done, depending on the suspected cause.  TREATMENT  Viral pharyngitis will usually get better in 3 4 days without the use of medicine. Bacterial pharyngitis is treated with medicines that kill germs (antibiotics).  HOME CARE INSTRUCTIONS   Drink enough water and fluids to keep your urine clear or pale yellow.   Only take over-the-counter or prescription medicines as directed by your health care provider:   If you are prescribed antibiotics, make sure you finish them even if you start to feel better.   Do not take aspirin.   Get lots of rest.   Gargle with 8 oz of salt water ( tsp of salt per 1 qt of water) as often as every 1 2  hours to soothe your throat.   Throat lozenges (if you are not at risk for choking) or sprays may be used to soothe your throat. SEEK MEDICAL CARE IF:   You have large, tender lumps in your neck.  You have a rash.  You cough up green, yellow-brown, or bloody spit. SEEK IMMEDIATE MEDICAL CARE IF:   Your neck becomes stiff.  You drool or are unable to swallow liquids.  You vomit or are unable to keep medicines or liquids down.  You have severe pain that does not go away with the use of recommended medicines.  You have trouble breathing (not caused by a stuffy nose). MAKE SURE YOU:   Understand these instructions.  Will watch your condition.  Will get help right away if you are not doing well or get worse. Document Released: 12/28/2004 Document Revised: 10/18/2012 Document Reviewed: 09/04/2012 [facility] Patient Information 2014 [place].  Gastroesophageal Reflux Disease, Adult During the evening add Zantac or pepcid if needed. Gastroesophageal reflux disease (GERD) happens when acid from your stomach goes into your food pipe (esophagus). The acid can cause a burning feeling in your chest. Over time, the acid can make small holes (ulcers) in your food pipe.  HOME CARE  Ask your doctor for advice about:  Losing weight.  Quitting smoking.  Alcohol use.  Avoid foods and drinks that make your problems worse. You may want to avoid:  Caffeine and alcohol.  Chocolate.  Mints.  Garlic and onions.  Spicy foods.  Citrus fruits, such as oranges, lemons, or limes.  Foods that  contain tomato, such as sauce, chili, salsa, and pizza.  Fried and fatty foods.  Avoid lying down for 3 hours before you go to bed or before you take a nap.  Eat small meals often, instead of large meals.  Wear loose-fitting clothing. Do not wear anything tight around your waist.  Raise (elevate) the head of your bed 6 to 8 inches with wood blocks. Using extra pillows does not  help.  Only take medicines as told by your doctor.  Do not take aspirin or ibuprofen. GET HELP RIGHT AWAY IF:   You have pain in your arms, neck, jaw, teeth, or back.  Your pain gets worse or changes.  You feel sick to your stomach (nauseous), throw up (vomit), or sweat (diaphoresis).  You feel short of breath, or you pass out (faint).  Your throw up is green, yellow, black, or looks like coffee grounds or blood.  Your poop (stool) is red, bloody, or black. MAKE SURE YOU:   Understand these instructions.  Will watch your condition.  Will get help right away if you are not doing well or get worse. Document Released: 06/16/2007 Document Revised: 03/22/2011 Document Reviewed: 07/17/2010 [facility] Patient Information 2014 [place], [place].

## 2013-05-07 NOTE — ED Provider Notes (Signed)
Medical screening examination/treatment/procedure(s) were performed by non-physician practitioner and as supervising physician I was immediately available for consultation/collaboration.  Philipp Deputy, M.D.  Harden Mo, MD 05/07/13 [phone]

## 2013-05-07 NOTE — ED Provider Notes (Signed)
Patient left without being seen.  Linus Mako, PA-C 05/07/13 2315

## 2013-05-07 NOTE — ED Notes (Signed)
C/o  Sore throat.  Pain with swallowing.  States "burning sensation in throat".  Low grade temp.   Hx of acid reflux.

## 2013-05-07 NOTE — ED Notes (Signed)
Pt c/o sore throat and painful swallowing since last night. Seen at [facility] for same this afternoon, reports no relief with medications. Also reports nasal congestion and drainage. She complains of burning type pain in her throat. Able to speak in complete sentences but states "it hurts." 98% RA. NAD.

## 2013-05-07 NOTE — ED Notes (Signed)
Pt states that after Eme, RN left the room after assessment, the pt states she took her own Tramadol and is now feeling better.

## 2013-05-07 NOTE — ED Provider Notes (Signed)
Medical screening examination/treatment/procedure(s) were performed by non-physician practitioner and as supervising physician I was immediately available for consultation/collaboration.   EKG Interpretation None       Threasa Beards, MD 05/07/13 2319

## 2013-05-09 LAB — CULTURE, GROUP A STREP

## 2013-06-14 ENCOUNTER — Encounter (HOSPITAL_COMMUNITY): Payer: Self-pay | Admitting: Emergency Medicine

## 2013-06-14 ENCOUNTER — Emergency Department (INDEPENDENT_AMBULATORY_CARE_PROVIDER_SITE_OTHER)
Admission: EM | Admit: 2013-06-14 | Discharge: 2013-06-14 | Disposition: A | Discharge status: Home / Self Care | Payer: PRIVATE HEALTH INSURANCE | Source: Home / Self Care | Attending: Emergency Medicine | Admitting: Emergency Medicine

## 2013-06-14 DIAGNOSIS — K2970 Gastritis, unspecified, without bleeding: Secondary | ICD-10-CM

## 2013-06-14 DIAGNOSIS — K760 Fatty (change of) liver, not elsewhere classified: Secondary | ICD-10-CM

## 2013-06-14 DIAGNOSIS — K2990 Gastroduodenitis, unspecified, without bleeding: Secondary | ICD-10-CM

## 2013-06-14 DIAGNOSIS — K7689 Other specified diseases of liver: Secondary | ICD-10-CM

## 2013-06-14 LAB — CBC WITH DIFFERENTIAL/PLATELET
BASOS ABS: 0 10*3/uL (ref 0.0–0.1)
BASOS PCT: 0 % (ref 0–1)
Eosinophils Absolute: 0.1 10*3/uL (ref 0.0–0.7)
Eosinophils Relative: 2 % (ref 0–5)
HCT: 38.7 % (ref 36.0–46.0)
Hemoglobin: 13.1 g/dL (ref 12.0–15.0)
Lymphocytes Relative: 41 % (ref 12–46)
Lymphs Abs: 2.3 10*3/uL (ref 0.7–4.0)
MCH: 27.9 pg (ref 26.0–34.0)
MCHC: 33.9 g/dL (ref 30.0–36.0)
MCV: 82.3 fL (ref 78.0–100.0)
Monocytes Absolute: 0.4 10*3/uL (ref 0.1–1.0)
Monocytes Relative: 6 % (ref 3–12)
NEUTROS ABS: 2.9 10*3/uL (ref 1.7–7.7)
NEUTROS PCT: 51 % (ref 43–77)
PLATELETS: 179 10*3/uL (ref 150–400)
RBC: 4.7 MIL/uL (ref 3.87–5.11)
RDW: 14.5 % (ref 11.5–15.5)
WBC: 5.6 10*3/uL (ref 4.0–10.5)

## 2013-06-14 LAB — COMPREHENSIVE METABOLIC PANEL
ALBUMIN: 3.9 g/dL (ref 3.5–5.2)
ALT: 108 U/L — ABNORMAL HIGH (ref 0–35)
AST: 122 U/L — AB (ref 0–37)
Alkaline Phosphatase: 96 U/L (ref 39–117)
BUN: 16 mg/dL (ref 6–23)
CO2: 29 mEq/L (ref 19–32)
Calcium: 9.8 mg/dL (ref 8.4–10.5)
Chloride: 103 mEq/L (ref 96–112)
Creatinine, Ser: 0.76 mg/dL (ref 0.50–1.10)
GFR calc Af Amer: >90 mL/min (ref >90)
GFR calc non Af Amer: 86 mL/min — ABNORMAL LOW (ref >90)
Glucose, Bld: 125 mg/dL — ABNORMAL HIGH (ref 70–99)
Potassium: 4.2 mEq/L (ref 3.7–5.3)
SODIUM: 142 meq/L (ref 137–147)
TOTAL PROTEIN: 8.3 g/dL (ref 6.0–8.3)
Total Bilirubin: 0.5 mg/dL (ref 0.3–1.2)

## 2013-06-14 LAB — LIPASE, BLOOD: LIPASE: 73 U/L — AB (ref 11–59)

## 2013-06-14 MED ORDER — ONDANSETRON 4 MG PO TBDP
ORAL_TABLET | ORAL | Status: AC
  Filled 2013-06-14: qty 2

## 2013-06-14 MED ORDER — ONDANSETRON 4 MG PO TBDP
8.0000 mg | ORAL_TABLET | Freq: Once | ORAL | Status: AC
  Administered 2013-06-14: 8 mg via ORAL

## 2013-06-14 MED ORDER — GI COCKTAIL ~~LOC~~
30.0000 mL | Freq: Once | ORAL | Status: AC
  Administered 2013-06-14: 30 mL via ORAL

## 2013-06-14 MED ORDER — SUCRALFATE 1 G PO TABS: 1.0000 g | ORAL_TABLET | Freq: Three times a day (TID) | ORAL | Status: DC

## 2013-06-14 MED ORDER — OMEPRAZOLE 40 MG PO CPDR: DELAYED_RELEASE_CAPSULE | ORAL | Status: DC

## 2013-06-14 NOTE — ED Notes (Addendum)
Pt  Reports  Symptoms  Of  abd  Pain      With  Nausea     gurgling   Bubbling  sensation in  adb   Vomited  Last  Pm    -  abd  Is  Tender  To the  Palpation           History  Of  Gi  Issues in past

## 2013-06-14 NOTE — ED Provider Notes (Addendum)
Chief Complaint   Chief Complaint  Patient presents with  . Abdominal Pain    History of Present Illness   Sharon Spears is a 68 year old female who has had abdominal pain off-and-on for years. It to scar worse again last night. It's localized in the epigastrium and radiates up into the chest and the back. She's had some nausea and vomited once, foamy white mucus without any blood. She's felt thirsty and had subjective fever. She's been somewhat constipated recently. The pain is worse with eating. She denies any blood in the stool or the vomitus. This is similar to the pain that she's had all along. She has had testing for this abdominal pain prior primary care physician. He told her that this was due to acid reflux and a bacterial infection and she has been treated for it, and also has a history of fatty liver and mild pancreatitis.  Review of Systems   Other than as noted above, the patient denies any of the following symptoms: Constitutional:  No fever, chills, weight loss or anorexia. Abdomen:  No nausea, vomiting, hematememesis, melena, diarrhea, or hematochezia. GU:  No dysuria, frequency, urgency, or hematuria. Gyn:  No vaginal discharge, itching, abnormal bleeding, dyspareunia, or pelvic pain.  [place]   Past medical history, family history, social history, meds, and allergies were reviewed.   Physical Exam     Vital signs:  BP 140/76  Pulse 78  Temp(Src) 98.6 F (37 C) (Oral)  Resp 18  SpO2 100% Gen:  Alert, oriented, in no distress. Lungs:  Breath sounds clear and equal bilaterally.  No wheezes, rales or rhonchi. Heart:  Regular rhythm.  No gallops or murmers.   Abdomen:  Soft, flat, nondistended. No organomegaly or mass. Bowel sounds are normally active. There is slight tenderness to palpation epigastrium. No rebound or guarding. Murphy sign and Murphy's punch were negative. Skin:  Clear, warm and dry.  No rash.  Labs   Results for orders placed during the hospital  encounter of 06/14/13  CBC WITH DIFFERENTIAL      Result Value Ref Range   WBC 5.6  4.0 - 10.5 K/uL   RBC 4.70  3.87 - 5.11 MIL/uL   Hemoglobin 13.1  12.0 - 15.0 g/dL   HCT 38.7  36.0 - 46.0 %   MCV 82.3  78.0 - 100.0 fL   MCH 27.9  26.0 - 34.0 pg   MCHC 33.9  30.0 - 36.0 g/dL   RDW 14.5  11.5 - 15.5 %   Platelets 179  150 - 400 K/uL   Neutrophils Relative % 51  43 - 77 %   Neutro Abs 2.9  1.7 - 7.7 K/uL   Lymphocytes Relative 41  12 - 46 %   Lymphs Abs 2.3  0.7 - 4.0 K/uL   Monocytes Relative 6  3 - 12 %   Monocytes Absolute 0.4  0.1 - 1.0 K/uL   Eosinophils Relative 2  0 - 5 %   Eosinophils Absolute 0.1  0.0 - 0.7 K/uL   Basophils Relative 0  0 - 1 %   Basophils Absolute 0.0  0.0 - 0.1 K/uL  COMPREHENSIVE METABOLIC PANEL      Result Value Ref Range   Sodium 142  137 - 147 mEq/L   Potassium 4.2  3.7 - 5.3 mEq/L   Chloride 103  96 - 112 mEq/L   CO2 29  19 - 32 mEq/L   Glucose, Bld 125 (*) 70 - 99 mg/dL  BUN 16  6 - 23 mg/dL   Creatinine, Ser 0.76  0.50 - 1.10 mg/dL   Calcium 9.8  8.4 - 10.5 mg/dL   Total Protein 8.3  6.0 - 8.3 g/dL   Albumin 3.9  3.5 - 5.2 g/dL   AST 122 (*) 0 - 37 U/L   ALT 108 (*) 0 - 35 U/L   Alkaline Phosphatase 96  39 - 117 U/L   Total Bilirubin 0.5  0.3 - 1.2 mg/dL   GFR calc non Af Amer 86 (*) >90 mL/min   GFR calc Af Amer >90  >90 mL/min  LIPASE, BLOOD      Result Value Ref Range   Lipase 73 (*) 11 - 59 U/L   Course in Urgent [place]   The following medications were given:  Medications  gi cocktail (Maalox,Lidocaine,Donnatal) (30 mLs Oral Given 06/14/13 1209)  ondansetron (ZOFRAN-ODT) disintegrating tablet 8 mg (8 mg Oral Given 06/14/13 1144)    She got complete relief of her discomfort following GI cocktail and Zofran.  Assessment   The primary encounter diagnosis was Gastritis. A diagnosis of Fatty liver was also pertinent to this visit.  I think her mild elevation in LFTs due to fatty liver. This is about the same as what it was  2 years ago. Has not been much change since then. A ultrasound of her abdomen her urine though showed only fatty liver.  Plan     1.  Meds:  The following meds were prescribed:   Discharge Medication List as of 06/14/2013 12:57 PM    START taking these medications   Details  !! omeprazole (PRILOSEC) 40 MG capsule 1 BID before meals, Normal    sucralfate (CARAFATE) 1 G tablet Take 1 tablet (1 g total) by mouth 4 (four) times daily -  with meals and at bedtime., Starting 06/14/2013, Until Discontinued, Normal     !! - Potential duplicate medications found. Please discuss with provider.      2.  Patient Education/Counseling:  The patient was given appropriate handouts, self care instructions, and instructed in symptomatic relief.    3.  Follow up:  The patient was told to follow up here if no better in 3 to 4 days, or sooner if becoming worse in any way, and given some red flag symptoms such as worsening pain, fever, vomiting, or evidence of GI bleeding which would prompt immediate return.  Follow up with Dr. Carol Ada.    Harden Mo, MD 06/14/13 1728  Addendum: An EKG was also done on the patient which was normal except for first degree AV block as documented below.  EKG Results:  Date: 06/15/2013  Rate: 75  Rhythm: normal sinus rhythm  QRS Axis: normal  Intervals: PR prolonged  ST/T Wave abnormalities: normal  Conduction Disutrbances:first-degree A-V block   Narrative Interpretation: Sinus rhythm with first degree AV block, otherwise normal EKG.  Old EKG Reviewed: none available      Harden Mo, MD 06/15/13 [phone]

## 2013-06-14 NOTE — Discharge Instructions (Signed)
Fatty Liver Fatty liver is the accumulation of fat in liver cells. It is also called hepatosteatosis or steatohepatitis. It is normal for your liver to contain some fat. If fat is more than 5 to 10% of your liver's weight, you have fatty liver.  There are often no symptoms (problems) for years while damage is still occurring. People often learn about their fatty liver when they have medical tests for other reasons. Fat can damage your liver for years or even decades without causing problems. When it becomes severe, it can cause fatigue, weight loss, weakness, and confusion. This makes you more likely to develop more serious liver problems. The liver is the largest organ in the body. It does a lot of work and often gives no warning signs when it is sick until late in a disease. The liver has many important jobs including:  Breaking down foods.  Storing vitamins, iron, and other minerals.  Making proteins.  Making bile for food digestion.  Breaking down many products including medications, alcohol and some poisons. CAUSES  There are a number of different conditions, medications, and poisons that can cause a fatty liver. Eating too many calories causes fat to build up in the liver. Not processing and breaking fats down normally may also cause this. Certain conditions, such as obesity, diabetes, and high triglycerides also cause this. Most fatty liver patients tend to be middle-aged and over weight.  Some causes of fatty liver are:  Alcohol over consumption.  Malnutrition.  Steroid use.  Valproic acid toxicity.  Obesity.  Cushing's syndrome.  Poisons.  Tetracycline in high dosages.  Pregnancy.  Diabetes.  Hyperlipidemia.  Rapid weight loss. Some people develop fatty liver even having none of these conditions. SYMPTOMS  Fatty liver most often causes no problems. This is called asymptomatic.  It can be diagnosed with blood tests and also by a liver biopsy.  It is one of the  most common causes of minor elevations of liver enzymes on routine blood tests.  Specialized Imaging of the liver using ultrasound, CT (computed tomography) scan, or MRI (magnetic resonance imaging) can suggest a fatty liver but a biopsy is needed to confirm it.  A biopsy involves taking a small sample of liver tissue. This is done by using a needle. It is then looked at under a microscope by a specialist. TREATMENT  It is important to treat the cause. Simple fatty liver without a medical reason may not need treatment.  Weight loss, fat restriction, and exercise in overweight patients produces inconsistent results but is worth trying.  Fatty liver due to alcohol toxicity may not improve even with stopping drinking.  Good control of diabetes may reduce fatty liver.  Lower your triglycerides through diet, medication or both.  Eat a balanced, healthy diet.  Increase your physical activity.  Get regular checkups from a liver specialist.  There are no medical or surgical treatments for a fatty liver or NASH, but improving your diet and increasing your exercise may help prevent or reverse some of the damage. PROGNOSIS  Fatty liver may cause no damage or it can lead to an inflammation of the liver. This is, called steatohepatitis. When it is linked to alcohol abuse, it is called alcoholic steatohepatitis. It often is not linked to alcohol. It is then called nonalcoholic steatohepatitis, or NASH. Over time the liver may become scarred and hardened. This condition is called cirrhosis. Cirrhosis is serious and may lead to liver failure or cancer. NASH is one of the  leading causes of cirrhosis. About 10-20% of Americans have fatty liver and a smaller 2-5% has NASH. Document Released: 02/12/2005 Document Revised: 03/22/2011 Document Reviewed: 04/07/2005 [facility] Patient Information 2014 [place]. Diet for Peptic Ulcer Disease Peptic ulcer disease is a term used to describe open sores in the  stomach and digestive tract. These sores can be painful, and the pain can be worse when the stomach is empty. These ulcers can lead to bleeding in the esophagus, stomach, and intestines (gastrointestinaltract). Nutrition therapy can help ease the discomfort of peptic ulcer disease.   GENERAL DIET GUIDELINES FOR PEPTIC ULCER DISEASE  Your diet should be rich in fruits, vegetables, and whole grains. It should also be low in fat.   Avoid foods that can irritate your sores.   Avoid foods that are not tolerated or foods that cause pain. This may be different for different people. FOODS AND DRINKS TO AVOID  High-fat dairy and milk products including whole milk, cream, chocolate milk, butter, sour cream, or cream cheese.   High-fat meats and poultry including hot dogs, fried meats or poultry, meats with skin, sausage, or bacon.   Pepper.  Alcohol.  Chocolate.  Tea, coffee, and cola (regular and caffeine).  Lard or hydrogenated oils such as stick margarine.  SAMPLE MEAL PLAN This meal plan is approximately 2000 calories based on [email or web address] meal planning guidelines.  Breakfast   cup cooked oatmeal.  1 cup strawberries.  1 cup low-fat milk.  1 oz almonds. Snack  1 cup cucumber slices.  6 oz yogurt (made from low-fat or fat-free milk). Lunch  2 slices whole-wheat bread.  2 oz sliced [place].  2 tsp mayonnaise.  1 cup blueberries.  1 cup snap peas. Snack  6 whole-wheat crackers.  1 oz string cheese. Dinner   cup brown rice.  1 cup mixed veggies.  1 tsp olive oil.  3 oz grilled fish. Document Released: 03/22/2011 Document Reviewed: 03/22/2011 [facility] Patient Information 2014 [place].

## 2013-06-20 ENCOUNTER — Telehealth: Payer: Self-pay | Admitting: Gastroenterology

## 2013-06-27 ENCOUNTER — Ambulatory Visit (INDEPENDENT_AMBULATORY_CARE_PROVIDER_SITE_OTHER): Payer: Medicare Other | Admitting: Podiatrist

## 2013-06-27 ENCOUNTER — Encounter: Payer: Self-pay | Admitting: Podiatrist

## 2013-06-27 VITALS — BP 111/59 | HR 80 | Resp 16

## 2013-06-27 DIAGNOSIS — G589 Mononeuropathy, unspecified: Secondary | ICD-10-CM

## 2013-06-27 DIAGNOSIS — E1149 Type 2 diabetes mellitus with other diabetic neurological complication: Secondary | ICD-10-CM

## 2013-06-27 DIAGNOSIS — G629 Polyneuropathy, unspecified: Secondary | ICD-10-CM

## 2013-06-27 DIAGNOSIS — E1142 Type 2 diabetes mellitus with diabetic polyneuropathy: Secondary | ICD-10-CM

## 2013-06-27 DIAGNOSIS — E1140 Type 2 diabetes mellitus with diabetic neuropathy, unspecified: Secondary | ICD-10-CM

## 2013-06-27 MED ORDER — PREGABALIN 75 MG PO CAPS: ORAL_CAPSULE | ORAL | Status: DC

## 2013-06-27 NOTE — Patient Instructions (Addendum)
Diabetic Neuropathy Diabetic neuropathy is a nerve disease or nerve damage that is caused by diabetes mellitus. About half of all people with diabetes mellitus have some form of nerve damage. Nerve damage is more common in those who have had diabetes mellitus for many years and who generally have not had good control of their blood sugar (glucose) level. Diabetic neuropathy is a common complication of diabetes mellitus. There are three more common types of diabetic neuropathy and a fourth type that is less common and less understood:   Peripheral neuropathy--This is the most common type of diabetic neuropathy. It causes damage to the nerves of the feet and legs first and then eventually the hands and arms.The damage affects the ability to sense touch.  Autonomic neuropathy--This type causes damage to the autonomic nervous system, which controls the following functions:  Heartbeat.  Body temperature.  Blood pressure.  Urination.  Digestion.  Sweating.  Sexual function.  Focal neuropathy--Focal neuropathy can be painful and unpredictable and occurs most often in older adults with diabetes mellitus. It involves a specific nerve or one area and often comes on suddenly. It usually does not cause long-term problems.  Radiculoplexus neuropathy-- Sometimes called lumbosacral radiculoplexus neuropathy, radiculoplexus neuropathy affects the nerves of the thighs, hips, buttocks, or legs. It is more common in people with type 2 diabetes mellitus and in older men. It is characterized by debilitating pain, weakness, and atrophy, usually in the thigh muscles. CAUSES  The cause of peripheral, autonomic, and focal neuropathies is diabetes mellitus that is uncontrolled and high glucose levels. The cause of radiculoplexus neuropathy is unknown. However, it is thought to be caused by inflammation related to uncontrolled glucose levels. SIGNS AND SYMPTOMS  Peripheral Neuropathy Peripheral neuropathy develops  slowly over time. When the nerves of the feet and legs no longer work there may be:   Burning, stabbing, or aching pain in the legs or feet.  Inability to feel pressure or pain in your feet. This can lead to:  Thick calluses over pressure areas.  Pressure sores.  Ulcers.  Foot deformities.  Reduced ability to feel temperature changes.  Muscle weakness. Autonomic Neuropathy The symptoms of autonomic neuropathy vary depending on which nerves are affected. Symptoms may include:  Problems with digestion, such as:  Feeling sick to your stomach (nausea).  Vomiting.  Bloating.  Constipation.  Diarrhea.  Abdominal pain.  Difficulty with urination. This occurs if you lose your ability to sense when your bladder is full. Problems include:  Urine leakage (incontinence).  Inability to empty your bladder completely (retention).  Rapid or irregular heartbeat (palpitations).  Blood pressure drops when you stand up (orthostatic hypotension). When you stand up you may feel:  Dizzy.  Weak.  Faint.  In men, inability to attain and maintain an erection.  In women, vaginal dryness and problems with decreased sexual desire and arousal.  Problems with body temperature regulation.  Increased or decreased sweating. Focal Neuropathy  Abnormal eye movements or abnormal alignment of both eyes.  Weakness in the wrist.  Foot drop. This results in an inability to lift the foot properly and abnormal walking or foot movement.  Paralysis on one side of your face (Bell palsy).  Chest or abdominal pain. Radiculoplexus Neuropathy  Sudden, severe pain in your hip, thigh, or buttocks.  Weakness and wasting of thigh muscles.  Difficulty rising from a seated position.  Abdominal swelling.  Unexplained weight loss (usually more than 10 lb [4.5 kg]). DIAGNOSIS  Peripheral Neuropathy Your senses may  be tested. Sensory function testing can be done with:  A light touch using a  monofilament.  A vibration with tuning fork.  A sharp sensation with a pin prick. Other tests that can help diagnose neuropathy are:  Nerve conduction velocity. This test checks the transmission of an electrical current through a nerve.  Electromyography. This shows how muscles respond to electrical signals transmitted by nearby nerves.  Quantitative sensory testing. This is used to assess how your nerves respond to vibrations and changes in temperature. Autonomic Neuropathy Diagnosis is often based on reported symptoms. Tell your health care provider if you experience:   Dizziness.   Constipation.   Diarrhea.   Inappropriate urination or inability to urinate.   Inability to get or maintain an erection.  Tests that may be done include:   Electrocardiography or Holter monitor. These are tests that can help show problems with the heart rate or heart rhythm.   An X-ray exam may be done. Focal Neuropathy Diagnosis is made based on your symptoms and what your health care provider finds during your exam. Other tests may be done. They may include:  Nerve conduction velocities. This checks the transmission of electrical current through a nerve.  Electromyography. This shows how muscles respond to electrical signals transmitted by nearby nerves.  Quantitative sensory testing. This test is used to assess how your nerves respond to vibration and changes in temperature. Radiculoplexus Neuropathy  Often the first thing is to eliminate any other issue or problems that might be the cause, as there is no stick test for diagnosis.  X-ray exam of your spine and lumbar region.  Spinal tap to rule out cancer.  MRI to rule out other lesions. TREATMENT  Once nerve damage occurs, it cannot be reversed. The goal of treatment is to keep the disease or nerve damage from getting worse and affecting more nerve fibers. Controlling your blood glucose level is the key. Most people with  radiculoplexus neuropathy see at least a partial improvement over time. You will need to keep your blood glucose and HbA1c levels in the target range determined by your health care provider. Things that help control blood glucose levels include:   Blood glucose monitoring.   Meal planning.   Physical activity.   Diabetes medicine.  Over time, maintaining lower blood glucose levels helps lessen symptoms. Sometimes, prescription pain medicine is needed. HOME CARE INSTRUCTIONS:  Do not smoke.  Keep your blood glucose level in the range that you and your health care provider have determined acceptable for you.  Keep your blood pressure level in the range that you and your health care provider have determined acceptable for you.  Eat a well-balanced diet.  Be active every day.  Check your feet every day. SEEK MEDICAL CARE IF:   You have burning, stabbing, or aching pain in the legs or feet.  You are unable to feel pressure or pain in your feet.  You develop problems with digestion such as:  Nausea.  Vomiting.  Bloating.  Constipation.  Diarrhea.  Abdominal pain.  You have difficulty with urination, such as:  Incontinence.  Retention.  You have palpitations.  You develop orthostatic hypotension. When you stand up you may feel:  Dizzy.  Weak.  Faint.  You cannot attain and maintain an erection (in men).  You have vaginal dryness and problems with decreased sexual desire and arousal (in women).  You have severe pain in your thighs, legs, or buttocks.  You have unexplained weight loss.  Document Released: 03/08/2001 Document Revised: 10/18/2012 Document Reviewed: 06/08/2012 [facility] Patient Information 2015 [place], [place]. This information is not intended to replace advice given to you by your health care provider. Make sure you discuss any questions you have with your health care provider.   Pregabalin capsules What is this medicine? PREGABALIN (pre  GAB a lin) is used to treat nerve pain from diabetes, shingles, spinal cord injury, and fibromyalgia. It is also used to control seizures in epilepsy. This medicine may be used for other purposes; ask your health care provider or pharmacist if you have questions. COMMON BRAND NAME(S): Lyrica What should I tell my health care provider before I take this medicine? They need to know if you have any of these conditions: -bleeding problems -heart disease, including heart failure -history of alcohol or drug abuse -kidney disease -suicidal thoughts, plans, or attempt; a previous suicide attempt by you or a family member -an unusual or allergic reaction to pregabalin, gabapentin, other medicines, foods, dyes, or preservatives -pregnant or trying to get pregnant or trying to conceive with your partner -breast-feeding How should I use this medicine? Take this medicine by mouth with a glass of water. Follow the directions on the prescription label. You can take this medicine with or without food. Take your doses at regular intervals. Do not take your medicine more often than directed. Do not stop taking except on your doctor's advice. A special MedGuide will be given to you by the pharmacist with each prescription and refill. Be sure to read this information carefully each time. Talk to your pediatrician regarding the use of this medicine in children. Special care may be needed. Overdosage: If you think you have taken too much of this medicine contact a poison control center or emergency room at once. NOTE: This medicine is only for you. Do not share this medicine with others. What if I miss a dose? If you miss a dose, take it as soon as you can. If it is almost time for your next dose, take only that dose. Do not take double or extra doses. What may interact with this medicine? -alcohol -certain medicines for blood pressure like captopril, enalapril, or lisinopril -certain medicines for diabetes, like  pioglitazone or rosiglitazone -certain medicines for anxiety or sleep -narcotic medicines for pain This list may not describe all possible interactions. Give your health care provider a list of all the medicines, herbs, non-prescription drugs, or dietary supplements you use. Also tell them if you smoke, drink alcohol, or use illegal drugs. Some items may interact with your medicine. What should I watch for while using this medicine? Tell your doctor or healthcare professional if your symptoms do not start to get better or if they get worse. Visit your doctor or health care professional for regular checks on your progress. Do not stop taking except on your doctor's advice. You may develop a severe reaction. Your doctor will tell you how much medicine to take. Wear a medical identification bracelet or chain if you are taking this medicine for seizures, and carry a card that describes your disease and details of your medicine and dosage times. You may get drowsy or dizzy. Do not drive, use machinery, or do anything that needs mental alertness until you know how this medicine affects you. Do not stand or sit up quickly, especially if you are an older patient. This reduces the risk of dizzy or fainting spells. Alcohol may interfere with the effect of this medicine. Avoid alcoholic drinks. If  you have a heart condition, like congestive heart failure, and notice that you are retaining water and have swelling in your hands or feet, contact your health care provider immediately. The use of this medicine may increase the chance of suicidal thoughts or actions. Pay special attention to how you are responding while on this medicine. Any worsening of mood, or thoughts of suicide or dying should be reported to your health care professional right away. This medicine has caused reduced sperm counts in some men. This may interfere with the ability to father a child. You should talk to your doctor or health care  professional if you are concerned about your fertility. Women who become pregnant while using this medicine for seizures may enroll in the [place] Pregnancy Registry by calling [phone]. This registry collects information about the safety of antiepileptic drug use during pregnancy. What side effects may I notice from receiving this medicine? Side effects that you should report to your doctor or health care professional as soon as possible: -allergic reactions like skin rash, itching or hives, swelling of the face, lips, or tongue -breathing problems -changes in vision -chest pain -confusion -jerking or unusual movements of any part of your body -loss of memory -muscle pain, tenderness, or weakness -suicidal thoughts or other mood changes -swelling of the ankles, feet, hands -unusual bruising or bleeding Side effects that usually do not require medical attention (Report these to your doctor or health care professional if they continue or are bothersome.): -dizziness -drowsiness -dry mouth -headache -nausea -tremors -trouble sleeping -weight gain This list may not describe all possible side effects. Call your doctor for medical advice about side effects. You may report side effects to FDA at 1-800-FDA-1088. Where should I keep my medicine? Keep out of the reach of children. This medicine can be abused. Keep your medicine in a safe place to protect it from theft. Do not share this medicine with anyone. Selling or giving away this medicine is dangerous and against the law. Store at room temperature between 15 and 30 degrees C (59 and 86 degrees F). Throw away any unused medicine after the expiration date. NOTE: This sheet is a summary. It may not cover all possible information. If you have questions about this medicine, talk to your doctor, pharmacist, or health care provider.  2014, [employer]. (2010-07-02 20:00:36)

## 2013-06-27 NOTE — Progress Notes (Signed)
   Chief Complaint  Patient presents with  . Foot Pain    forefoot and toes bilateral (R>L)   "I am having cramps in my foot, especially the right. It is worse at night. I am diabetic. I also have these pin like needles that also feels like its itching"     HPI: Patient is 68 y.o. female who presents today for pain and pins and needles in her right foot.  She saw dr. Hal Neer per my recommendation from her last visit who recommended a spinal fusion.  Her daughter googled the surgery and she decided it was too risky to have the procedure based on her internet research.  She presents today complaining of numbness and neuropathic type pain to the right foot--  Unchanged from her last visit here.      Physical Exam GENERAL APPEARANCE: Alert, conversant. Appropriately groomed. No acute distress.  VASCULAR: Pedal pulses palpable at 2/4 DP and PT bilateral.  Capillary refill time is immediate to all digits,  Proximal to distal temperature is warm to warm.  Digital hair growth is present bilateral  NEUROLOGIC: sensation decreased epicritically and protectively.  Numbness, tingling and neuropathic pain is present right-- likely from her back and neuropathy MUSCULOSKELETAL: contracture of lesser digits is present-- forefoot cavus deformity is present.     DERMATOLOGIC: skin color, texture, and turger are within normal limits.  No preulcerative lesions are seen, no interdigital maceration noted.  No open lesions present.  Digital nails are asymptomatic.    Assessment: neuropathy vs. radiculopathy  Plan:recommended diabetic shoes with accomidative orthotics to help with her foot pain. Recommended lyrica for the neuropathy. Will recheck with her in 4 weeks to see how its doing.

## 2013-07-04 NOTE — Telephone Encounter (Signed)
Called to schedule IAWV. Pt denied services at this time.

## 2013-07-11 ENCOUNTER — Ambulatory Visit: Payer: Self-pay | Admitting: Podiatrist

## 2013-08-10 ENCOUNTER — Ambulatory Visit: Payer: Medicare Other | Admitting: Podiatrist

## 2014-10-20 ENCOUNTER — Emergency Department (INDEPENDENT_AMBULATORY_CARE_PROVIDER_SITE_OTHER)
Admission: EM | Admit: 2014-10-20 | Discharge: 2014-10-20 | Disposition: A | Discharge status: Home / Self Care | Payer: Medicare Other | Source: Home / Self Care

## 2014-10-20 ENCOUNTER — Encounter (HOSPITAL_COMMUNITY): Payer: Self-pay | Admitting: Emergency Medicine

## 2014-10-20 DIAGNOSIS — S025XXA Fracture of tooth (traumatic), initial encounter for closed fracture: Secondary | ICD-10-CM

## 2014-10-20 MED ORDER — CLINDAMYCIN HCL 150 MG PO CAPS: 150.0000 mg | ORAL_CAPSULE | Freq: Four times a day (QID) | ORAL | Status: DC

## 2014-10-20 MED ORDER — HYDROCODONE-ACETAMINOPHEN 5-325 MG PO TABS: 2.0000 | ORAL_TABLET | ORAL | Status: DC | PRN

## 2014-10-20 NOTE — Discharge Instructions (Signed)
Caries dental (Dental Caries) La caries dental es la ms comn de todas las enfermedades de la boca. Ocurre en todas las edades, pero es ms frecuente en nios y Evan.  CMO SE DESARROLLA LA CARIES DENTAL  El proceso de caries comienza cuando las bacterias de la boca se combinan con los alimentos, (especialmente azcares y almidones) para Visual merchandiser. La placa es una sustancia que se adhiere a las superficies duras de los dientes (Paramedic). Las bacterias de la placa producen cidos que atacan el esmalte de los dientes. Estos cidos tambin pueden atacar la superficie de la raz de un diente (cemento) si este est expuesto. Los ataques repetidos disuelven estas superficies y crean huecos en el diente (cavidades). Si no se tratan, los cidos [employer] capas del diente.  [place] frecuente de bebidas azucaradas.   El consumo frecuente de alimentos que contienen azcar y almidn y de aquellos que se quedan fcilmente adheridos a los dientes.   Higiene bucal deficiente.   Sequedad en la boca.   Abuso de sustancias como metanfetaminas.   Arreglos dentales en mal estado o mal hechos.   Trastornos de Youth worker.   Reflujo gastroesofgico (ERGE).   Ciertos tratamientos de radiacin en la cabeza y el cuello. SNTOMAS  En las etapas tempranas de la caries dental, rara vez hay sntomas. En algunos casos pueden observarse zonas blancas, con aspecto de tiza, [employer] u otras capas del diente. En las etapas posteriores, los sntomas incluyen:   Hoyos y [employer].  Dolor en los dientes despus de consumir alimentos o bebidas dulces, calientes o fros.  Dolor alrededor del diente.  Inflamacin alrededor del diente. [place] veces, la caries dental se detecta durante un control habitual. El diagnstico se realiza despus hacer de una detallada historia mdica y odontolgica y de la observacin  de las superficies de los dientes buscando signos de caries dental. En algunos casos se utilizan instrumentos especiales, como rayos lser, para buscar caries dentales. Podrn tomarle radiografas dentales de modo que puedan buscarse caries que no se observan a simple vista (como entre las zonas de [employer]).  TRATAMIENTO  Si la caries dental se encuentra en una etapa temprana, podr revertirse con un tratamiento con flor o la aplicacin de un agente remineralizante en el consultorio del dentista. Es necesario un buen cepillado y [place] del hilo dental para ayudar a estos tratamientos. Si est en etapas ms avanzadas, el tratamiento depender de la ubicacin y la extensin de la destruccin dental:   Si se ha destruido una pequea zona del diente, la zona ser removida y las cavidades se llenarn con un material como una amalgama de oro o plata o un compuesto de resina.   Si se ha destruido una zona grande del diente, la zona destruida ser removida y se Glass blower/designer una cubierta (corona) sobre la estructura que quede del diente.   Si est afectada la parte central del diente (pulpa), ser necesario realizar un procedimiento llamado tratamiento de conducto antes de llenar la cavidad o colocar una corona.   Si la mayor parte del diente est destruido, ser necesario extirpar Acupuncturist (extraerlo). INSTRUCCIONES PARA EL CUIDADO EN EL HOGAR  Podr evitar, detener o revertir las caries dentales en su casa, con una buena higiene bucal. La buena higiene bucal incluye:   Una buena higiene de los dientes [place] veces por da  con cepillo e hilo dental.   Use una pasta dental con flor. [place] usar un enjuague dental con flor si se lo recomienda el odontlogo o el mdico.   Limite la cantidad de alimentos y bebidas que contengan azcar y almidones que consume.   Evite el consumo frecuente de estos alimentos y bebidas.   Cumpla con las visitas a un dentista para  realizar controles y limpieza regulares. PREVENCIN   Mantenga una buena higiene bucal.  Considere un sellador dental. Un sellador dental es un revestimiento de material que aplica el dentista a las muescas y [employer]. El sellador impide que los alimentos queden atrapados en los huecos. Puede proteger a los Marine scientist.  Pida suplementos con flor si vive en una comunidad cuya agua no tiene flor o con agua que tenga bajo contenido de flor. Use suplementos de flor segn las indicaciones del odontlogo o el [place] aplicaciones de flor en los dientes si se lo indica el odontlogo o el mdico.   Esta informacin no tiene Marine scientist el consejo del mdico. Asegrese de hacerle al mdico cualquier pregunta que tenga.   Document Released: 12/28/2004 Document Revised: 01/18/2014 Elsevier Interactive Patient Education [employer].

## 2014-10-20 NOTE — ED Notes (Signed)
Pt c/o dental pain onset last night due to a chip Does no have a dentist A&O x4... No acute distress.

## 2014-10-20 NOTE — ED Provider Notes (Signed)
CSN: [id number]     Arrival date & time 10/20/14  1259 History   None    Chief Complaint  Patient presents with  . Dental Pain   (Consider location/radiation/quality/duration/timing/severity/associated sxs/prior Treatment)  HPI   Patient is a 69 year old female presenting today with complaints of tooth pain area patient states she was eating plantains yesterday evening when her tooth broke and she was able to spit part of her tooth (L Upper molar) out onto the palm of her hand. Patient states she "never goes to the dentist".. Patient is here visiting from [place] and does not have a Database administrator. She does however have insurance. Denies any significant medical history other than blood pressure type 2 diabetes and a cardiac history of irregular heartbeat. Patient takes her medication including an 81 mg aspirin daily. The patient's primary complaint in addition to the tooth pain is that there is a "sharp edge" that is cutting into her cheek causing discomfort.  Past Medical History  Diagnosis Date  . Diabetes mellitus   . Hypertension   . Heart palpitations   . Asthma   . Coronary artery disease    Past Surgical History  Procedure Laterality Date  . Coronary artery bypass graft    . Tonsillectomy    . Cesarean section    . Esophageal manometry N/A 03/06/2012    Procedure: ESOPHAGEAL MANOMETRY (EM);  Surgeon: Beryle Beams, MD;  Location: [facility];  Service: Endoscopy;  Laterality: N/A;   No family history on file. Social History  Substance Use Topics  . Smoking status: Never Smoker   . Smokeless tobacco: None  . Alcohol Use: No   OB History    No data available     Review of Systems  Constitutional: Negative.  Negative for fever and chills.  HENT: Positive for dental problem. Negative for drooling, facial swelling, sinus pressure, sneezing, sore throat and trouble swallowing.   Eyes: Negative.   Respiratory: Negative.  Negative for shortness of breath and wheezing.    Cardiovascular: Negative.   Gastrointestinal: Negative.   Endocrine: Negative.   Genitourinary: Negative.   Skin: Negative.  Negative for pallor and rash.  Allergic/Immunologic: Negative.   Neurological: Negative.   Hematological: Negative.   Psychiatric/Behavioral: Negative.     Allergies  Penicillins  Home Medications   Prior to Admission medications   Medication Sig Start Date End Date Taking? Authorizing Provider  lisinopril-hydrochlorothiazide (PRINZIDE,ZESTORETIC) 20-12.5 MG per tablet Take 1 tablet by mouth daily.   Yes Historical Provider, MD  metFORMIN (GLUCOPHAGE) 500 MG tablet Take 500 mg by mouth 2 (two) times daily with a meal.   Yes Historical Provider, MD  metoprolol tartrate (LOPRESSOR) 25 MG tablet Take 25 mg by mouth 2 (two) times daily.  04/21/13  Yes Historical Provider, MD  omeprazole (PRILOSEC) 40 MG capsule 1 BID before meals 06/14/13  Yes Harden Mo, MD  albuterol (PROVENTIL HFA;VENTOLIN HFA) 108 (90 BASE) MCG/ACT inhaler Inhale 2 puffs into the lungs every 6 (six) hours as needed. For wheezing.    Historical Provider, MD  ALPRAZolam Duanne Moron) 1 MG tablet Take 1 mg by mouth 2 (two) times daily as needed. For anxiety.    Historical Provider, MD  B Complex Vitamins (B COMPLEX PO) Take 1 capsule by mouth daily.    Historical Provider, MD  clindamycin (CLEOCIN) 150 MG capsule Take 1 capsule (150 mg total) by mouth every 6 (six) hours. 10/20/14   Nehemiah Settle, NP  fluticasone (FLONASE) 50  MCG/ACT nasal spray Place 2 sprays into both nostrils 2 (two) times daily. 01/16/13   Liam Graham, PA-C  HYDROcodone-acetaminophen (NORCO/VICODIN) 5-325 MG tablet Take 2 tablets by mouth every 4 (four) hours as needed. 10/20/14   Nehemiah Settle, NP  nitroGLYCERIN (NITROSTAT) 0.4 MG SL tablet Place 0.4 mg under the tongue every 5 (five) minutes as needed. For chest pain.    Historical Provider, MD  pregabalin (LYRICA) 75 MG capsule Take 1 tablet po at night before bed for 1  week, then may increase to 1 tablet at dinner and 1 tablet before bed thereafter for a total of 2 tabs per day 06/27/13   Bronson Ing, DPM  traMADol (ULTRAM) 50 MG tablet Take by mouth every 6 (six) hours as needed.    Historical Provider, MD   Meds Ordered and Administered this Visit  Medications - No data to display  BP 164/61 mmHg  Pulse 80  Temp(Src) 98.8 F (37.1 C) (Oral)  Resp 16  SpO2 99% No data found.   Physical Exam  Constitutional: She appears well-developed and well-nourished. No distress.  HENT:  Head: Normocephalic.  Mouth/Throat: No oropharyngeal exudate.  Left upper molar is broken off.  Some bruising and breaks in mucosa to left buccal area surrounding injury.  No active bleeding, swelling or drainage noted.   Eyes: Conjunctivae are normal. Pupils are equal, round, and reactive to light. Right eye exhibits no discharge. Left eye exhibits no discharge. No scleral icterus.  Neck: Normal range of motion. Neck supple. No thyromegaly present.  Cardiovascular: Normal rate and intact distal pulses.  Exam reveals no gallop and no friction rub.   Murmur heard. Patient has a grade 3/6 systolic murmur best heard over pulmonic region.  Pulmonary/Chest: Effort normal and breath sounds normal. No respiratory distress. She has no wheezes. She has no rales. She exhibits no tenderness.  Lymphadenopathy:    She has no cervical adenopathy.  Skin: She is not diaphoretic.  Nursing note and vitals reviewed.   ED Course  Procedures (including critical care time)  Labs Review Labs Reviewed - No data to display  Imaging Review No results found.   Plan of care discussed with Dr. Bridgett Larsson.   MDM   1. Tooth, broken, closed, initial encounter    Meds ordered this encounter  Medications  . HYDROcodone-acetaminophen (NORCO/VICODIN) 5-325 MG tablet    Sig: Take 2 tablets by mouth every 4 (four) hours as needed.    Dispense:  10 tablet    Refill:  0  . clindamycin (CLEOCIN)  150 MG capsule    Sig: Take 1 capsule (150 mg total) by mouth every 6 (six) hours.    Dispense:  28 capsule    Refill:  0   Plan of care was discussed with patient. Patient states due to cardiac history she requires anabiotic prophylaxis prior to dental treatment. The patient verbalizes understanding and agrees to plan of care.       Nehemiah Settle, NP 10/20/14 1345

## 2014-11-13 ENCOUNTER — Encounter (HOSPITAL_COMMUNITY): Payer: Self-pay | Admitting: *Deleted

## 2014-11-13 ENCOUNTER — Emergency Department (HOSPITAL_COMMUNITY)
Admission: EM | Admit: 2014-11-13 | Discharge: 2014-11-13 | Disposition: A | Discharge status: Home / Self Care | Payer: Medicare Other | Attending: Emergency Medicine | Admitting: Emergency Medicine

## 2014-11-13 DIAGNOSIS — Z79899 Other long term (current) drug therapy: Secondary | ICD-10-CM | POA: Insufficient documentation

## 2014-11-13 DIAGNOSIS — K0889 Other specified disorders of teeth and supporting structures: Secondary | ICD-10-CM | POA: Diagnosis present

## 2014-11-13 DIAGNOSIS — R110 Nausea: Secondary | ICD-10-CM | POA: Diagnosis not present

## 2014-11-13 DIAGNOSIS — Z880 Allergy status to penicillin: Secondary | ICD-10-CM | POA: Insufficient documentation

## 2014-11-13 DIAGNOSIS — K047 Periapical abscess without sinus: Secondary | ICD-10-CM | POA: Insufficient documentation

## 2014-11-13 DIAGNOSIS — I10 Essential (primary) hypertension: Secondary | ICD-10-CM | POA: Diagnosis not present

## 2014-11-13 DIAGNOSIS — E119 Type 2 diabetes mellitus without complications: Secondary | ICD-10-CM | POA: Insufficient documentation

## 2014-11-13 DIAGNOSIS — Z7951 Long term (current) use of inhaled steroids: Secondary | ICD-10-CM | POA: Insufficient documentation

## 2014-11-13 DIAGNOSIS — K029 Dental caries, unspecified: Secondary | ICD-10-CM | POA: Diagnosis not present

## 2014-11-13 DIAGNOSIS — Z7984 Long term (current) use of oral hypoglycemic drugs: Secondary | ICD-10-CM | POA: Insufficient documentation

## 2014-11-13 DIAGNOSIS — R002 Palpitations: Secondary | ICD-10-CM | POA: Diagnosis not present

## 2014-11-13 DIAGNOSIS — Z951 Presence of aortocoronary bypass graft: Secondary | ICD-10-CM | POA: Insufficient documentation

## 2014-11-13 DIAGNOSIS — J45909 Unspecified asthma, uncomplicated: Secondary | ICD-10-CM | POA: Diagnosis not present

## 2014-11-13 DIAGNOSIS — I2510 Atherosclerotic heart disease of native coronary artery without angina pectoris: Secondary | ICD-10-CM | POA: Diagnosis not present

## 2014-11-13 MED ORDER — CEPHALEXIN 500 MG PO CAPS: 500.0000 mg | ORAL_CAPSULE | Freq: Four times a day (QID) | ORAL | Status: DC

## 2014-11-13 NOTE — ED Provider Notes (Signed)
CSN: [id number]     Arrival date & time 11/13/14  1055 History  By signing my name below, I, Tula Nakayama, attest that this documentation has been prepared under the direction and in the presence of Delsa Grana, PA-C.  Electronically Signed: Tula Nakayama, ED Scribe. 11/13/2014. 12:50 PM.   Chief Complaint  Patient presents with  . Dental Pain   The history is provided by the patient. No language interpreter was used.   HPI Comments: Carleta Woodrow is a 69 y.o. female with a history of CABG who presents to the Emergency Department complaining of constant, moderate upper and lower left dental pain that started 1 month ago. She states swelling of her left cheek, subjective fever and nausea as associated symptoms. Her pain becomes worse with chewing. Pt reports multiple dental fractures associated with pain. She was evaluated by Urgent Care on 10/9 for her pain who prescribed Clindamycin, but pt was noncompliant. They also prescribed Hydrocodone at that visit, which controls her pain. Pt tried to follow-up with a dentist, but it was a pediatric dentist who would not see her. Pt denies vomiting.  Past Medical History  Diagnosis Date  . Diabetes mellitus   . Hypertension   . Heart palpitations   . Asthma   . Coronary artery disease    Past Surgical History  Procedure Laterality Date  . Coronary artery bypass graft    . Tonsillectomy    . Cesarean section    . Esophageal manometry N/A 03/06/2012    Procedure: ESOPHAGEAL MANOMETRY (EM);  Surgeon: Beryle Beams, MD;  Location: [facility];  Service: Endoscopy;  Laterality: N/A;   No family history on file. Social History  Substance Use Topics  . Smoking status: Never Smoker   . Smokeless tobacco: None  . Alcohol Use: No   OB History    No data available     Review of Systems  Constitutional: Positive for fever.  HENT: Positive for dental problem.   Gastrointestinal: Positive for nausea. Negative for vomiting.  All other  systems reviewed and are negative.  Allergies  Penicillins  Home Medications   Prior to Admission medications   Medication Sig Start Date End Date Taking? Authorizing Provider  albuterol (PROVENTIL HFA;VENTOLIN HFA) 108 (90 BASE) MCG/ACT inhaler Inhale 2 puffs into the lungs every 6 (six) hours as needed. For wheezing.    Historical Provider, MD  ALPRAZolam Duanne Moron) 1 MG tablet Take 1 mg by mouth 2 (two) times daily as needed. For anxiety.    Historical Provider, MD  B Complex Vitamins (B COMPLEX PO) Take 1 capsule by mouth daily.    Historical Provider, MD  cephALEXin (KEFLEX) 500 MG capsule Take 1 capsule (500 mg total) by mouth 4 (four) times daily. 11/13/14   Delsa Grana, PA-C  clindamycin (CLEOCIN) 150 MG capsule Take 1 capsule (150 mg total) by mouth every 6 (six) hours. 10/20/14   Nehemiah Settle, NP  fluticasone (FLONASE) 50 MCG/ACT nasal spray Place 2 sprays into both nostrils 2 (two) times daily. 01/16/13   Liam Graham, PA-C  HYDROcodone-acetaminophen (NORCO/VICODIN) 5-325 MG tablet Take 2 tablets by mouth every 4 (four) hours as needed. 10/20/14   Nehemiah Settle, NP  lisinopril-hydrochlorothiazide (PRINZIDE,ZESTORETIC) 20-12.5 MG per tablet Take 1 tablet by mouth daily.    Historical Provider, MD  metFORMIN (GLUCOPHAGE) 500 MG tablet Take 500 mg by mouth 2 (two) times daily with a meal.    Historical Provider, MD  metoprolol tartrate (LOPRESSOR) 25 MG  tablet Take 25 mg by mouth 2 (two) times daily.  04/21/13   Historical Provider, MD  nitroGLYCERIN (NITROSTAT) 0.4 MG SL tablet Place 0.4 mg under the tongue every 5 (five) minutes as needed. For chest pain.    Historical Provider, MD  omeprazole (PRILOSEC) 40 MG capsule 1 BID before meals 06/14/13   Harden Mo, MD  pregabalin (LYRICA) 75 MG capsule Take 1 tablet po at night before bed for 1 week, then may increase to 1 tablet at dinner and 1 tablet before bed thereafter for a total of 2 tabs per day 06/27/13   Bronson Ing, DPM   traMADol (ULTRAM) 50 MG tablet Take by mouth every 6 (six) hours as needed.    Historical Provider, MD   BP 144/69 mmHg  Pulse 73  Temp(Src) 98.2 F (36.8 C) (Oral)  Resp 18  SpO2 96% Physical Exam  Constitutional: She is oriented to person, place, and time. She appears well-developed and well-nourished. No distress.  HENT:  Head: Normocephalic and atraumatic.  Right Ear: External ear normal.  Left Ear: External ear normal.  Nose: Nose normal.  Mouth/Throat: Uvula is midline, oropharynx is clear and moist and mucous membranes are normal. Mucous membranes are not pale, not dry and not cyanotic. No oral lesions. No trismus in the jaw. Abnormal dentition. Dental caries present. No dental abscesses or uvula swelling. No oropharyngeal exudate, posterior oropharyngeal edema or posterior oropharyngeal erythema.    Left buccal gingiva is edematous and tender with no fluctuance No sublingual TTP Left cheek is non erythematous No trismus  Eyes: Conjunctivae and EOM are normal. Pupils are equal, round, and reactive to light. Right eye exhibits no discharge. Left eye exhibits no discharge. No scleral icterus.  Neck: Normal range of motion and full passive range of motion without pain. Neck supple. No JVD present. No tracheal tenderness, no spinous process tenderness and no muscular tenderness present. No rigidity. No tracheal deviation, no edema, no erythema and normal range of motion present. No thyromegaly present.  Cardiovascular: Normal rate, regular rhythm, normal heart sounds and intact distal pulses.  Exam reveals no gallop and no friction rub.   No murmur heard. Pulmonary/Chest: Effort normal and breath sounds normal. No stridor. No respiratory distress.  Abdominal: Soft. Bowel sounds are normal. She exhibits no distension. There is no tenderness.  Musculoskeletal: Normal range of motion. She exhibits no edema or tenderness.  Lymphadenopathy:    She has no cervical adenopathy.   Neurological: She is alert and oriented to person, place, and time. She exhibits normal muscle tone. Coordination normal.  Skin: Skin is warm and dry. No rash noted. She is not diaphoretic. No cyanosis or erythema. No pallor. Nails show no clubbing.  Psychiatric: She has a normal mood and affect. Her behavior is normal. Judgment and thought content normal.  Nursing note and vitals reviewed.   ED Course  Procedures  DIAGNOSTIC STUDIES: Oxygen Saturation is 94% on RA, adequate by my interpretation.    COORDINATION OF CARE: 12:47 PM Discussed treatment plan with pt which includes prescription for Clindamycin. Advised pt to take Clindamycin as prescribed. She agreed to plan.  Labs Review Labs Reviewed - No data to display  Imaging Review No results found.   EKG Interpretation None      MDM   Final diagnoses:  Dental infection   Pt with approximately 1 mo of dental pain.  Pt has been non-compliant, or has not understood prior treatments given to her for possible dental  infection.  On 10/20/14 she was given 153m clinda to take four times a day, and almost a month later she has 4 pills remaining.  She stated that yesterday she took took and today she took two pills, but over the past several weeks she took them intermittently.  She does have hydrocodone which has given her some relief.  She went to a dentist, but reports that "she was too complicated" and they would not treat her.  On exam she has some swelling in the left cheek and multiple dental carries to molars of left upper and lower jaw, also broken teeth that are irritating her and scratching her gums.  She was afebrile, not tachycardic.  Had no trismus, sublingual tenderness, no palpable fluctuance or abscess. no concern for Ludwigs or sepsis.    Pt was discussed with Dr. JWinfred Leedswho has seen and evaluated the pt. He instructed to prescribe keflex.  Pt was given new abx and resource guide for dental contacts.  No dentist  oncall for ER today.  Pt was discharged in good condition, VSS.   Filed Vitals:   11/13/14 1109 11/13/14 1405  BP: 141/64 144/69  Pulse: 76 73  Temp: 98.2 F (36.8 C) 98.2 F (36.8 C)  TempSrc: Oral Oral  Resp: 18 18  SpO2: 94% 96%    I personally performed the services described in this documentation, which was scribed in my presence. The recorded information has been reviewed and is accurate.      LDelsa Grana PA-C 11/14/14 [id number] SOrlie Dakin MD 11/14/14 [phone]

## 2014-11-13 NOTE — Discharge Instructions (Signed)
Emergency Department Resource Guide 1) Find a Doctor and Pay Out of Pocket Although you won't have to find out who is covered by your insurance plan, it is a good idea to ask around and get recommendations. You will then need to call the office and see if the doctor you have chosen will accept you as a new patient and what types of options they offer for patients who are self-pay. Some doctors offer discounts or will set up payment plans for their patients who do not have insurance, but you will need to ask so you aren't surprised when you get to your appointment.  2) Contact Your Local Health Department Not all health departments have doctors that can see patients for sick visits, but many do, so it is worth a call to see if yours does. If you don't know where your local health department is, you can check in your phone book. The CDC also has a tool to help you locate your state's health department, and many state websites also have listings of all of their local health departments.  3) Find a [facility] If your illness is not likely to be very severe or complicated, you may want to try a walk in clinic. These are popping up all over the country in pharmacies, drugstores, and shopping centers. They're usually staffed by nurse practitioners or physician assistants that have been trained to treat common illnesses and complaints. They're usually fairly quick and inexpensive. However, if you have serious medical issues or chronic medical problems, these are probably not your best option.  No Primary Care Doctor: - Call Health Connect at  [phone] - they can help you locate a primary care doctor that  accepts your insurance, provides certain services, etc. - Physician Referral Service- [phone]  Chronic Pain Problems: Organization         Address  Phone   Notes  [facility]  [phone] Patients need to be referred by their primary care doctor.   Medication  Assistance: Organization         Address  Phone   Notes  [facility] Medication [facility] [address] [place], [place] [address] [phone] --Must be a resident of [place] [facility] -- Must have NO insurance coverage whatsoever (no Medicaid/ Medicare, etc.) -- The pt. MUST have a primary care doctor that directs their care regularly and follows them in the community   [facility]  [phone]   [employer]  [phone]    Agencies that provide inexpensive medical care: Organization         Address  Phone   Notes  [place]  [phone]   [facility]    [phone]   [facility] [place], [place] [address] [phone]   [place] [address] [place] [phone]   Planned Parenthood    [phone]   [facility]    [phone]   [place] [address], [place] Phone:  [phone], Fax:  [phone] Hours of Operation:  9 am - 6 pm, M-F.  Also accepts Medicaid/Medicare and self-pay.  [facility] for [place], [address],  Phone: [phone], Fax: [phone]. Hours of Operation:  8:30 am - 5:30 pm, M-F.  Also accepts Medicaid and self-pay.  [facility] [place] [address], [place] Phone: [phone]   [place], [place], [place] [phone], Ext. [phone] Mondays & Thursdays: 7-9 AM.  First 15 patients are seen on a first come, first serve basis.    [place] Providers:  Organization         Address  Phone   Notes  [facility] [address], [place] [phone] Also accepts self-pay patients.  [facility] [address], [place]  [phone]   [place], [address], [place]  [phone]   [facility] [address], [place] [phone]   Lucianne Lei [address], [place]   [phone] Only accepts [place] Access [place] patients after they have their name applied to their card.   Self-Pay (no insurance) in [facility]:  Organization         Address  Phone   Notes  Sickle Cell Patients, [facility] [place] [phone]   [facility] [place] [phone]   [facility] [place]  [place], [place], [place] [phone]   [facility]/Dr. Osei-Bonsu  [address] [place] or [address], [place] [phone] Phone number for both [place] and [place] locations is the same.  [facility] [address] [place] [phone]   [facility] [address] [place] or [address] [phone]   [facility] [address], [place] [phone], phone; [phone], fax Sees patients 1st and 3rd Saturday of every month.  Must not qualify for public or private insurance (i.e. Medicaid, Medicare, [place] Health Choice, Veterans' Benefits)  Household income should be no more than 200% of the poverty level The clinic cannot treat you if you are pregnant or think you are pregnant  Sexually transmitted diseases are not treated at the clinic.    Dental Care: Organization         Address  Phone  Notes  [place] [facility] [place] [phone] Accepts children up to age 4 who are enrolled in [place] or [place]; pregnant women with a Medicaid card; and children who have applied for Medicaid or [place] Health Choice, but were declined, whose parents can pay a reduced fee at time of service.  [facility]  [facility]  [address], [place] [phone] Accepts children up to age 35 who are enrolled in [place] or [place]; pregnant women with a Medicaid card; and children who have applied for Medicaid or [place] Health Choice, but were declined, whose parents can pay a reduced fee at time of service.  [place]  [place] [phone] Patients are seen by appointment only. Walk-ins are not accepted. [place] will see patients 43 years of age and older. Monday - Tuesday (8am-5pm) Most Wednesdays (8:30-5pm) $30 per visit, cash only  [facility] Adult [facility] Access PRO[facility]  [address], [facility] [phone] Patients are seen by appointment only. Walk-ins are not accepted. [place] will see patients 40 years of age and older. One  Wednesday Evening (Monthly: Volunteer Based).  $30 per visit, cash only  °[facility]  [phone] for adults; Children under age 4, call Graduate Pediatric Dentistry at [phone]. Children aged 4-14, please call [phone] to request a pediatric application. ° Dental services are provided in all areas of dental care including fillings, crowns and bridges, complete and partial dentures, implants, gum treatment, root canals, and extractions. Preventive care is also provided. Treatment is provided to both adults and children. °Patients are selected via a lottery and there is often a waiting list. °  °[facility] [address], °[place] ° ([phone] [email or web address] °  °[facility] [address], [place], [place] ([phone], Ext. [phone] Second and Fourth Thursday of each month, opens at 6:30 AM; Clinic ends at 9 AM.  Patients are seen on a first-come first-served basis, and a limited number are seen during each clinic.  ° °[facility] ° [address], [place], [place] [phone]    Eligibility Requirements °You must have lived in [place], [place], or [place] counties for at least the last three months. °  You cannot be eligible for state or federal sponsored healthcare insurance, including Veterans Administration, Medicaid, or Medicare. °  You generally cannot be eligible for healthcare insurance through your employer.  °  How to apply: °Eligibility screenings are held every Tuesday and Wednesday afternoon from 1:00 pm until 4:00 pm. You do not need an appointment for the interview!  °[facility] [address], [place], [place] [phone]   °[facility]  [phone]   °[facility]  [phone]   °[facility]  336-570-6415   ° °Behavioral Health Resources in the Community: °Intensive Outpatient Programs °Organization         Address  Phone  Notes  °[facility] [address], [place], [place] [phone]   °[facility] [address], [place], [place] [phone]   °[facility] [address], [place], [place] ° [phone]   °[facility] [address],  °[place], [place] 1-800-853-5163 or 336-641-4981   °Substance Abuse Resources °Organization         Address  Phone  Notes  °[facility]  336-882-2125   °[facility]  [phone]   °[facility]  336-285-9073   °[facility]  [phone]   °Residential & Outpatient Substance Abuse Program  1-800-659-3381   °Psychological Services °Organization         Address  Phone  Notes  °[facility]  336- [phone]   °[facility]  336- [phone]   °[facility] [address], [place] 1-800-853-5163 or 336-641-4981   ° °Mobile Crisis Teams °Organization         Address  Phone  Notes  °[facility], Mobile Crisis Care Unit  1-877-626-1772   °Assertive °[facility]otherapeutic Services ° [address]  [place], [place] 336-834-9664   °Sharon DeEsch [address] °[place] 336-554-5454   ° °Self-Help/Support Groups °Organization         Address  Phone             Notes  °[facility] [place] - variety of support groups  336- 373-1402 Call for more information  °Narcotics Anonymous (NA), [facility] [address], °[place]  2 meetings at this location  ° °  Residential Treatment Programs Organization         Address  Phone  Notes  [facility] [address]    [place]  1-(913)[phone]   [facility]  [address], [place] [phone], [place], [place]   [place], [place] [phone] Admissions: 8am-3pm M-F  [facility] [place] [address],    [place], [place] [phone]   [facility] [address] [place], [place], [place]   [facility] [address]  [place], [place]   [facility] - Intensive Outpatient [address], [place], [place]   [facility] [address]  [place], [place] [phone] or [phone]   [facility] ([facility]) [address], [place], [place] Accepts Medicaid  [facility] [place] [address]  [place] [phone]-(317)[phone] Substance Abuse/Addiction Treatment   [facility] Organization         Address  Phone  Notes  [facility]  [phone]   Domenic Schwab, PhD [address] [place], [place]   [phone] or [phone]   [place], [place] [phone]   [facility] [address], [place], [place] [phone] Insurance/Medicaid/sponsorship through [facility] [address] [place]                                    [place], [place] [phone] [place] [address], [place] [phone]    Dr. Adele Schilder  [phone]   [facility] Dept. 1) [address] [place] 2) [place] 3)  [address], [place] [phone]  [phone]   [place] [phone] or [phone] (After Hours)       Dental Care and Dentist Visits Dental care supports good overall health. Regular dental visits can also help you avoid dental pain, bleeding, infection, and other more serious health problems in the future. It is important to keep the mouth healthy because diseases in the teeth, gums, and other oral tissues can spread to other areas of the body. Some problems, such as diabetes, heart disease, and pre-term labor have been associated with poor oral health.  See your dentist every 6 months. If you experience emergency problems such as a toothache or broken tooth, go to the dentist right away. If you see your dentist regularly, you may catch problems early. It is easier to be treated for problems in the early stages.  WHAT TO EXPECT AT A DENTIST VISIT  Your dentist will look for many common oral health problems and recommend proper treatment. At your regular dental visit, you can expect:  Gentle cleaning of the teeth and gums. This includes scraping and polishing. This helps to remove the sticky substance around the teeth and gums (plaque). Plaque forms in the mouth shortly after eating. Over time, plaque hardens on the teeth as tartar. If tartar is not removed regularly, it can cause problems. Cleaning also helps remove stains.  Periodic X-rays. These pictures of the teeth and supporting bone will help your dentist assess the health of  your teeth.  Periodic fluoride treatments. Fluoride is a natural mineral shown to help strengthen teeth. Fluoride treatmentinvolves applying a fluoride gel or varnish to  the teeth. It is most commonly done in children.  Examination of the mouth, tongue, jaws, teeth, and gums to look for any oral health problems, such as:  Cavities (dental caries). This is decay on the tooth caused by plaque, sugar, and acid in the mouth. It is best to catch a cavity when it is small.  Inflammation of the gums caused by plaque buildup (gingivitis).  Problems with the mouth or malformed or misaligned teeth.  Oral cancer or other diseases of the soft tissues or jaws. KEEP YOUR TEETH AND GUMS HEALTHY For healthy teeth and gums, follow these general guidelines as well as your dentist's specific advice:  Have your teeth professionally cleaned at the dentist every 6 months.  Brush twice daily with a fluoride toothpaste.  Floss your teeth daily.  Ask your dentist if you need fluoride supplements, treatments, or fluoride toothpaste.  Eat a healthy diet. Reduce foods and drinks with added sugar.  Avoid smoking. TREATMENT FOR ORAL HEALTH PROBLEMS If you have oral health problems, treatment varies depending on the conditions present in your teeth and gums.  Your caregiver will most likely recommend good oral hygiene at each visit.  For cavities, gingivitis, or other oral health disease, your caregiver will perform a procedure to treat the problem. This is typically done at a separate appointment. Sometimes your caregiver will refer you to another dental specialist for specific tooth problems or for surgery. SEEK IMMEDIATE DENTAL CARE IF:  You have pain, bleeding, or soreness in the gum, tooth, jaw, or mouth area.  A permanent tooth becomes loose or separated from the gum socket.  You experience a blow or injury to the mouth or jaw area.   This information is not intended to replace advice given to you by your health care provider. Make sure you discuss any questions you have with your health care provider.   Document Released: 09/09/2010 Document Revised: 03/22/2011  Document Reviewed: 09/09/2010 Elsevier Interactive Patient Education 2016 [place] Pain Dental pain may be caused by many things, including:  Tooth decay (cavities or caries). Cavities expose the nerve of your tooth to air and hot or cold temperatures. This can cause pain or discomfort.  Abscess or infection. A dental abscess is a collection of infected pus from a bacterial infection in the inner part of the tooth (pulp). It usually occurs at the end of the tooth's root.  Injury.  An unknown reason (idiopathic). Your pain may be mild or severe. It may only occur when:  You are chewing.  You are exposed to hot or cold temperature.  You are eating or drinking sugary foods or beverages, such as soda or candy. Your pain may also be constant. HOME CARE INSTRUCTIONS Watch your dental pain for any changes. The following actions may help to lessen any discomfort that you are feeling:  Take medicines only as directed by your dentist.  If you were prescribed an antibiotic medicine, finish all of it even if you start to feel better.  Keep all follow-up visits as directed by your dentist. This is important.  Do not apply heat to the outside of your face.  Rinse your mouth or gargle with salt water if directed by your dentist. This helps with pain and swelling.  You can make salt water by adding  tsp of salt to  1 cup of warm water.  Apply ice to the painful area of your face:  Put ice in a plastic bag.  Place a towel between your skin and the bag.  Leave the ice on for 20 minutes, 2-3 times per day.  Avoid foods or drinks that cause you pain, such as:  Very hot or very cold foods or drinks.  Sweet or sugary foods or drinks. SEEK MEDICAL CARE IF:  Your pain is not controlled with medicines.  Your symptoms are worse.  You have new symptoms. SEEK IMMEDIATE MEDICAL CARE IF:  You are unable to open your mouth.  You are having trouble breathing or  swallowing.  You have a fever.  Your face, neck, or jaw is swollen.   This information is not intended to replace advice given to you by your health care provider. Make sure you discuss any questions you have with your health care provider.   Document Released: 12/28/2004 Document Revised: 05/14/2014 Document Reviewed: 12/24/2013 Elsevier Interactive Patient Education [employer].

## 2014-11-13 NOTE — ED Notes (Signed)
Pt reports dental pain on the left upper and lower. Pt reports several broken teeth.

## 2014-11-13 NOTE — ED Provider Notes (Signed)
Complains of pain at peak #16,17 and 18 for the past several days. No trismus. No nausea or vomiting. No other associated symptoms she's been treated with hydrocodone with partial relief. She was referred to a dentist but she reports that person was a pediatric dentist so she comes here for further evaluation and dental referral. Patient is alert and nontoxic. HEENT exam no trismus. She has multiple fillings including a teeth numbers 16, 17 and 18. Nontender teeth. No cervical adenopathy. Handling secretions well. Planned our referral suggests prescription Keflex.  Orlie Dakin, MD 11/13/14 [phone]

## 2015-03-20 ENCOUNTER — Observation Stay (HOSPITAL_COMMUNITY)
Admission: EM | Admit: 2015-03-20 | Discharge: 2015-03-22 | Disposition: A | Discharge status: Home / Self Care | Payer: Medicare Other | Attending: Internal Medicine | Admitting: Internal Medicine

## 2015-03-20 ENCOUNTER — Encounter (HOSPITAL_COMMUNITY): Payer: Self-pay | Admitting: *Deleted

## 2015-03-20 ENCOUNTER — Emergency Department (HOSPITAL_COMMUNITY): Payer: Medicare Other

## 2015-03-20 DIAGNOSIS — D696 Thrombocytopenia, unspecified: Secondary | ICD-10-CM | POA: Diagnosis not present

## 2015-03-20 DIAGNOSIS — I2510 Atherosclerotic heart disease of native coronary artery without angina pectoris: Secondary | ICD-10-CM | POA: Insufficient documentation

## 2015-03-20 DIAGNOSIS — J45901 Unspecified asthma with (acute) exacerbation: Secondary | ICD-10-CM | POA: Diagnosis not present

## 2015-03-20 DIAGNOSIS — J9621 Acute and chronic respiratory failure with hypoxia: Secondary | ICD-10-CM | POA: Insufficient documentation

## 2015-03-20 DIAGNOSIS — J111 Influenza due to unidentified influenza virus with other respiratory manifestations: Secondary | ICD-10-CM

## 2015-03-20 DIAGNOSIS — R05 Cough: Secondary | ICD-10-CM | POA: Diagnosis present

## 2015-03-20 DIAGNOSIS — R509 Fever, unspecified: Secondary | ICD-10-CM

## 2015-03-20 DIAGNOSIS — Z7982 Long term (current) use of aspirin: Secondary | ICD-10-CM | POA: Diagnosis not present

## 2015-03-20 DIAGNOSIS — Z7984 Long term (current) use of oral hypoglycemic drugs: Secondary | ICD-10-CM | POA: Diagnosis not present

## 2015-03-20 DIAGNOSIS — Z951 Presence of aortocoronary bypass graft: Secondary | ICD-10-CM | POA: Diagnosis not present

## 2015-03-20 DIAGNOSIS — Z7951 Long term (current) use of inhaled steroids: Secondary | ICD-10-CM | POA: Diagnosis not present

## 2015-03-20 DIAGNOSIS — J09X2 Influenza due to identified novel influenza A virus with other respiratory manifestations: Secondary | ICD-10-CM | POA: Diagnosis not present

## 2015-03-20 DIAGNOSIS — E119 Type 2 diabetes mellitus without complications: Secondary | ICD-10-CM

## 2015-03-20 DIAGNOSIS — I10 Essential (primary) hypertension: Secondary | ICD-10-CM | POA: Insufficient documentation

## 2015-03-20 DIAGNOSIS — J040 Acute laryngitis: Secondary | ICD-10-CM

## 2015-03-20 DIAGNOSIS — Z79899 Other long term (current) drug therapy: Secondary | ICD-10-CM | POA: Insufficient documentation

## 2015-03-20 DIAGNOSIS — E1169 Type 2 diabetes mellitus with other specified complication: Secondary | ICD-10-CM

## 2015-03-20 LAB — BASIC METABOLIC PANEL WITH GFR
Anion gap: 12 (ref 5–15)
BUN: 14 mg/dL (ref 6–20)
CO2: 23 mmol/L (ref 22–32)
Calcium: 9.2 mg/dL (ref 8.9–10.3)
Chloride: 104 mmol/L (ref 101–111)
Creatinine, Ser: 0.73 mg/dL (ref 0.44–1.00)
GFR calc Af Amer: >60 mL/min
GFR calc non Af Amer: >60 mL/min
Glucose, Bld: 168 mg/dL — ABNORMAL HIGH (ref 65–99)
Potassium: 4.1 mmol/L (ref 3.5–5.1)
Sodium: 139 mmol/L (ref 135–145)

## 2015-03-20 LAB — CBC
HCT: 38.1 % (ref 36.0–46.0)
HEMOGLOBIN: 12.6 g/dL (ref 12.0–15.0)
MCH: 26.3 pg (ref 26.0–34.0)
MCHC: 33.1 g/dL (ref 30.0–36.0)
MCV: 79.5 fL (ref 78.0–100.0)
PLATELETS: 138 10*3/uL — AB (ref 150–400)
RBC: 4.79 MIL/uL (ref 3.87–5.11)
RDW: 16 % — ABNORMAL HIGH (ref 11.5–15.5)
WBC: 5 10*3/uL (ref 4.0–10.5)

## 2015-03-20 LAB — URINE MICROSCOPIC-ADD ON
Bacteria, UA: NONE SEEN
WBC UA: NONE SEEN WBC/hpf (ref 0–5)

## 2015-03-20 LAB — BRAIN NATRIURETIC PEPTIDE: B Natriuretic Peptide: 36.1 pg/mL (ref 0.0–100.0)

## 2015-03-20 LAB — INFLUENZA PANEL BY PCR (TYPE A & B)
H1N1 flu by pcr: NOT DETECTED
Influenza A By PCR: POSITIVE — AB
Influenza B By PCR: NEGATIVE

## 2015-03-20 LAB — URINALYSIS, ROUTINE W REFLEX MICROSCOPIC
BILIRUBIN URINE: NEGATIVE
Glucose, UA: NEGATIVE mg/dL
Ketones, ur: NEGATIVE mg/dL
LEUKOCYTES UA: NEGATIVE
NITRITE: NEGATIVE
PH: 7 (ref 5.0–8.0)
Protein, ur: NEGATIVE mg/dL
SPECIFIC GRAVITY, URINE: 1.004 — AB (ref 1.005–1.030)

## 2015-03-20 LAB — GLUCOSE, CAPILLARY
Glucose-Capillary: 191 mg/dL — ABNORMAL HIGH (ref 65–99)
Glucose-Capillary: 213 mg/dL — ABNORMAL HIGH (ref 65–99)

## 2015-03-20 LAB — STREP PNEUMONIAE URINARY ANTIGEN: Strep Pneumo Urinary Antigen: NEGATIVE

## 2015-03-20 LAB — I-STAT TROPONIN, ED: Troponin i, poc: 0 ng/mL (ref 0.00–0.08)

## 2015-03-20 LAB — PROCALCITONIN: Procalcitonin: <0.1 ng/mL

## 2015-03-20 MED ORDER — LEVOFLOXACIN IN D5W 750 MG/150ML IV SOLN
750.0000 mg | INTRAVENOUS | Status: DC
  Administered 2015-03-20 – 2015-03-21 (×2): 750 mg via INTRAVENOUS
  Filled 2015-03-20 (×3): qty 150

## 2015-03-20 MED ORDER — METOPROLOL TARTRATE 25 MG PO TABS
25.0000 mg | ORAL_TABLET | Freq: Two times a day (BID) | ORAL | Status: DC
  Administered 2015-03-20 – 2015-03-22 (×4): 25 mg via ORAL
  Filled 2015-03-20 (×4): qty 1

## 2015-03-20 MED ORDER — INSULIN ASPART 100 UNIT/ML ~~LOC~~ SOLN
0.0000 [IU] | Freq: Every day | SUBCUTANEOUS | Status: DC
  Administered 2015-03-20: 2 [IU] via SUBCUTANEOUS

## 2015-03-20 MED ORDER — PREDNISONE 20 MG PO TABS
30.0000 mg | ORAL_TABLET | Freq: Once | ORAL | Status: AC
  Administered 2015-03-20: 30 mg via ORAL
  Filled 2015-03-20: qty 2

## 2015-03-20 MED ORDER — ONDANSETRON HCL 4 MG PO TABS: 4.0000 mg | ORAL_TABLET | Freq: Four times a day (QID) | ORAL | Status: DC | PRN

## 2015-03-20 MED ORDER — ONDANSETRON HCL 4 MG/2ML IJ SOLN: 4.0000 mg | Freq: Three times a day (TID) | INTRAMUSCULAR | Status: DC | PRN

## 2015-03-20 MED ORDER — IPRATROPIUM-ALBUTEROL 0.5-2.5 (3) MG/3ML IN SOLN
3.0000 mL | Freq: Two times a day (BID) | RESPIRATORY_TRACT | Status: DC
  Administered 2015-03-21 (×2): 3 mL via RESPIRATORY_TRACT
  Filled 2015-03-20 (×3): qty 3

## 2015-03-20 MED ORDER — ALBUTEROL SULFATE HFA 108 (90 BASE) MCG/ACT IN AERS: 2.0000 | INHALATION_SPRAY | Freq: Four times a day (QID) | RESPIRATORY_TRACT | Status: DC | PRN

## 2015-03-20 MED ORDER — FAMOTIDINE 20 MG PO TABS
20.0000 mg | ORAL_TABLET | Freq: Two times a day (BID) | ORAL | Status: DC
  Administered 2015-03-20 – 2015-03-22 (×4): 20 mg via ORAL
  Filled 2015-03-20 (×4): qty 1

## 2015-03-20 MED ORDER — OSELTAMIVIR PHOSPHATE 75 MG PO CAPS
75.0000 mg | ORAL_CAPSULE | Freq: Two times a day (BID) | ORAL | Status: DC
  Administered 2015-03-20 – 2015-03-22 (×5): 75 mg via ORAL
  Filled 2015-03-20 (×7): qty 1

## 2015-03-20 MED ORDER — BENZONATATE 100 MG PO CAPS
200.0000 mg | ORAL_CAPSULE | Freq: Three times a day (TID) | ORAL | Status: DC | PRN
  Administered 2015-03-20: 200 mg via ORAL
  Filled 2015-03-20: qty 2

## 2015-03-20 MED ORDER — ACETAMINOPHEN 325 MG PO TABS
650.0000 mg | ORAL_TABLET | Freq: Four times a day (QID) | ORAL | Status: DC | PRN
  Administered 2015-03-20 – 2015-03-21 (×3): 650 mg via ORAL
  Filled 2015-03-20 (×4): qty 2

## 2015-03-20 MED ORDER — IPRATROPIUM-ALBUTEROL 0.5-2.5 (3) MG/3ML IN SOLN
3.0000 mL | Freq: Four times a day (QID) | RESPIRATORY_TRACT | Status: DC
  Administered 2015-03-20 (×2): 3 mL via RESPIRATORY_TRACT
  Filled 2015-03-20 (×2): qty 3

## 2015-03-20 MED ORDER — HYDROCODONE-HOMATROPINE 5-1.5 MG/5ML PO SYRP
5.0000 mL | ORAL_SOLUTION | ORAL | Status: DC | PRN
  Administered 2015-03-20: 5 mL via ORAL
  Filled 2015-03-20: qty 5

## 2015-03-20 MED ORDER — ALPRAZOLAM 0.5 MG PO TABS
1.0000 mg | ORAL_TABLET | Freq: Two times a day (BID) | ORAL | Status: DC | PRN
  Administered 2015-03-21: 1 mg via ORAL
  Filled 2015-03-20: qty 2

## 2015-03-20 MED ORDER — ONDANSETRON HCL 4 MG/2ML IJ SOLN: 4.0000 mg | Freq: Four times a day (QID) | INTRAMUSCULAR | Status: DC | PRN

## 2015-03-20 MED ORDER — INSULIN ASPART 100 UNIT/ML ~~LOC~~ SOLN
0.0000 [IU] | Freq: Three times a day (TID) | SUBCUTANEOUS | Status: DC
  Administered 2015-03-20: 2 [IU] via SUBCUTANEOUS
  Administered 2015-03-21: 5 [IU] via SUBCUTANEOUS
  Administered 2015-03-21: 1 [IU] via SUBCUTANEOUS

## 2015-03-20 MED ORDER — SODIUM CHLORIDE 0.9 % IV SOLN
INTRAVENOUS | Status: DC
  Administered 2015-03-20 – 2015-03-21 (×2): via INTRAVENOUS

## 2015-03-20 MED ORDER — ENOXAPARIN SODIUM 40 MG/0.4ML ~~LOC~~ SOLN
40.0000 mg | SUBCUTANEOUS | Status: DC
  Administered 2015-03-20 – 2015-03-21 (×2): 40 mg via SUBCUTANEOUS
  Filled 2015-03-20 (×2): qty 0.4

## 2015-03-20 MED ORDER — ACETAMINOPHEN 325 MG PO TABS
650.0000 mg | ORAL_TABLET | Freq: Once | ORAL | Status: AC
  Administered 2015-03-20: 650 mg via ORAL
  Filled 2015-03-20: qty 2

## 2015-03-20 MED ORDER — ASPIRIN EC 81 MG PO TBEC
81.0000 mg | DELAYED_RELEASE_TABLET | Freq: Every day | ORAL | Status: DC
  Administered 2015-03-20 – 2015-03-22 (×3): 81 mg via ORAL
  Filled 2015-03-20 (×4): qty 1

## 2015-03-20 MED ORDER — FLUTICASONE PROPIONATE 50 MCG/ACT NA SUSP
2.0000 | Freq: Two times a day (BID) | NASAL | Status: DC
  Administered 2015-03-21 – 2015-03-22 (×3): 2 via NASAL
  Filled 2015-03-20: qty 16

## 2015-03-20 MED ORDER — ALBUTEROL SULFATE (2.5 MG/3ML) 0.083% IN NEBU
2.5000 mg | INHALATION_SOLUTION | Freq: Four times a day (QID) | RESPIRATORY_TRACT | Status: AC | PRN
Start: 2015-03-20 — End: 2015-03-21

## 2015-03-20 MED ORDER — ACETAMINOPHEN 650 MG RE SUPP: 650.0000 mg | Freq: Four times a day (QID) | RECTAL | Status: DC | PRN

## 2015-03-20 NOTE — ED Notes (Signed)
Pt c/o non productive cough onset yesterday with generalized pain, pt states, "I am having palpitations." pt c/o weakness & SOB, pt A&O x4, denies v/d, A&O x4

## 2015-03-20 NOTE — ED Notes (Signed)
Dr. Audie Pinto MD at bedside.

## 2015-03-20 NOTE — H&P (Signed)
[facility] History and Physical                                                                                    Sharon Spears, is a 70 y.o. female  MRN: [id number]   DOB - 1945-03-31  Admit Date - 03/20/2015  Outpatient Primary MD for the patient is Beryle Beams, MD  Referring MD: Audie Pinto / ER  PMH: Past Medical History  Diagnosis Date  . Diabetes mellitus   . Hypertension   . Heart palpitations   . Asthma   . Coronary artery disease       PSH: Past Surgical History  Procedure Laterality Date  . Coronary artery bypass graft    . Tonsillectomy    . Cesarean section    . Esophageal manometry N/A 03/06/2012    Procedure: ESOPHAGEAL MANOMETRY (EM);  Surgeon: Beryle Beams, MD;  Location: [facility];  Service: Endoscopy;  Laterality: N/A;     CC:  Chief Complaint  Patient presents with  . Cough     HPI: 70 year old female patient with underlying asthma with occasional use of oxygen at home, hypertension and diabetes who presents to the hospital with acute onset fevers chills paroxysmal coughing laryngitis vomiting and headache. Symptoms began yesterday evening and persisted to the night. She reports a temperature max of 102F. She's been short of breath with pleuritic chest pain and significant sore throat. She utilized her home inhalers without any improvement in her symptoms.  ER Evaluation and treatment: Rectal temp of 100.2, BP 160/76, pulse 104, respirations 20, room air saturations 95% at rest improved 100% on 2 L nasal cannula oxygen 2 View CXR: sub segmental atelectasis right middle lobe EKG: Sinus tachycardia with ventricular rate 107 bpm, QTC 459 ms, no ischemic changes Laboratory data: Na 139, K 4.1, BUN 14, Cr 0.73, glucose 168, BNP 36, troponin 0.00, WBC 5000, hemoglobin 12.6, platelets 138,000   Review of Systems   In addition to the HPI above,  No changes with Vision or hearing, new weakness, tingling, numbness in any extremity, Acute  odynophagia regarding swallowing food or Liquids very to sore throat, indigestion/reflux No palpitations, orthopnea or DOE No Abdominal pain, no melena or hematochezia, no dark tarry stools No dysuria, hematuria or flank pain No new skin rashes, lesions, masses or bruises, No new joints pains-aches No recent weight gain or loss No polyuria, polydypsia or polyphagia,  *A full 10 point Review of Systems was done, except as stated above, all other Review of Systems were negative.  Social History Social History  Substance Use Topics  . Smoking status: Never Smoker   . Smokeless tobacco: Not on file  . Alcohol Use: No    Resides at: Private residence  Lives with: Alone  Ambulatory status: A cane   Family History No family history on file. Patient denies significant family history relating to pulmonary diseases   Prior to Admission medications   Medication Sig Start Date End Date Taking? Authorizing Provider  albuterol (PROVENTIL HFA;VENTOLIN HFA) 108 (90 BASE) MCG/ACT inhaler Inhale 2 puffs into the lungs every 6 (six) hours as needed. For wheezing.   Yes Historical Provider, MD  ALPRAZolam (XANAX) 1 MG tablet Take 1 mg by mouth 2 (two) times daily as needed. For anxiety.   Yes Historical Provider, MD  aspirin 81 MG tablet Take 81 mg by mouth daily.   Yes Historical Provider, MD  B Complex Vitamins (B COMPLEX PO) Take 1 capsule by mouth daily.   Yes Historical Provider, MD  famotidine (PEPCID) 20 MG tablet Take 20 mg by mouth 2 (two) times daily. 02/04/15  Yes Historical Provider, MD  fluticasone (FLONASE) 50 MCG/ACT nasal spray Place 2 sprays into both nostrils 2 (two) times daily. 01/16/13  Yes Liam Graham, PA-C  lisinopril-hydrochlorothiazide (PRINZIDE,ZESTORETIC) 20-12.5 MG per tablet Take 1 tablet by mouth daily.   Yes Historical Provider, MD  metFORMIN (GLUCOPHAGE) 500 MG tablet Take 500 mg by mouth 2 (two) times daily with a meal.   Yes Historical Provider, MD  metoprolol  tartrate (LOPRESSOR) 25 MG tablet Take 25 mg by mouth 2 (two) times daily.  04/21/13  Yes Historical Provider, MD  nitroGLYCERIN (NITROSTAT) 0.4 MG SL tablet Place 0.4 mg under the tongue every 5 (five) minutes as needed. For chest pain.   Yes Historical Provider, MD  cephALEXin (KEFLEX) 500 MG capsule Take 1 capsule (500 mg total) by mouth 4 (four) times daily. Patient not taking: Reported on 03/20/2015 11/13/14   Delsa Grana, PA-C  clindamycin (CLEOCIN) 150 MG capsule Take 1 capsule (150 mg total) by mouth every 6 (six) hours. Patient not taking: Reported on 03/20/2015 10/20/14   Nehemiah Settle, NP  HYDROcodone-acetaminophen (NORCO/VICODIN) 5-325 MG tablet Take 2 tablets by mouth every 4 (four) hours as needed. Patient not taking: Reported on 03/20/2015 10/20/14   Nehemiah Settle, NP  omeprazole (PRILOSEC) 40 MG capsule 1 BID before meals Patient not taking: Reported on 03/20/2015 06/14/13   Harden Mo, MD  pregabalin (LYRICA) 75 MG capsule Take 1 tablet po at night before bed for 1 week, then may increase to 1 tablet at dinner and 1 tablet before bed thereafter for a total of 2 tabs per day Patient not taking: Reported on 03/20/2015 06/27/13   Bronson Ing, DPM    Allergies  Allergen Reactions  . Penicillins Other (See Comments)    GI upset    Physical Exam  Vitals  Blood pressure 149/65, pulse 104, temperature 100.2 F (37.9 C), temperature source Rectal, resp. rate 16, height _0  (1.6 m), weight 154 lb 9 oz (70.109 kg), SpO2 100 %.   General:  In Mild distress as evidenced by laryngitis and paroxysmal coughing  Psych:  Normal affect, Denies Suicidal or Homicidal ideations, Awake Alert, Oriented X 3. Speech and thought patterns are clear and appropriate, no apparent short term memory deficits  Neuro:   No focal neurological deficits, CN II through XII intact, Strength 5/5 all 4 extremities, Sensation intact all 4 extremities.  ENT:  Ears and Eyes appear Normal, Conjunctivae  clear, PER. Dry oral mucosa without erythema or exudates.  Neck:  Supple, bilateral boggy cervical lymphadenopathy appreciated  Respiratory:  Symmetrical chest wall movement, diminished air movement bilaterally especially in bases, coarse but without wheezing or crackles, 2 L  Cardiac:  RRR, No Murmurs, no LE edema noted, no JVD, No carotid bruits, peripheral pulses palpable at 2+  Abdomen:  Positive bowel sounds, Soft, Non tender, Non distended,  No masses appreciated, no obvious hepatosplenomegaly  Skin:  No Cyanosis, Normal Skin Turgor, No Skin Rash or Bruise.  Extremities: Symmetrical without obvious trauma or injury,  no  effusions.  Data Review  CBC  Recent Labs Lab 03/20/15 0901  WBC 5.0  HGB 12.6  HCT 38.1  PLT 138*  MCV 79.5  MCH 26.3  MCHC 33.1  RDW 16.0*    Chemistries   Recent Labs Lab 03/20/15 0901  NA 139  K 4.1  CL 104  CO2 23  GLUCOSE 168*  BUN 14  CREATININE 0.73  CALCIUM 9.2    estimated creatinine clearance is 63.2 mL/min (by C-G formula based on Cr of 0.73).  No results for input(s): TSH, T4TOTAL, T3FREE, THYROIDAB in the last 72 hours.  Invalid input(s): FREET3  Coagulation profile No results for input(s): INR, PROTIME in the last 168 hours.  No results for input(s): DDIMER in the last 72 hours.  Cardiac Enzymes No results for input(s): CKMB, TROPONINI, MYOGLOBIN in the last 168 hours.  Invalid input(s): CK  Invalid input(s): POCBNP  Urinalysis    Component Value Date/Time   COLORURINE YELLOW 03/05/2013 1300   APPEARANCEUR CLEAR 03/05/2013 1300   LABSPEC 1.020 03/05/2013 1300   PHURINE 5.5 03/05/2013 1300   GLUCOSEU NEGATIVE 03/05/2013 1300   HGBUR SMALL* 03/05/2013 1300   BILIRUBINUR NEGATIVE 03/05/2013 1300   KETONESUR NEGATIVE 03/05/2013 1300   PROTEINUR NEGATIVE 03/05/2013 1300   UROBILINOGEN 0.2 03/05/2013 1300   NITRITE NEGATIVE 03/05/2013 1300   LEUKOCYTESUR NEGATIVE 03/05/2013 1300    Imaging results:   Dg  Chest 2 View  03/20/2015  CLINICAL DATA:  Dry cough with mid chest pain, fever, nasal congestion and weakness, onset of symptoms 1 day ago, hypertension, diabetes mellitus, asthma, coronary artery disease EXAM: CHEST  2 VIEW COMPARISON:  None FINDINGS: Enlargement of cardiac silhouette post CABG. Pulmonary vascular congestion. Mediastinal contours normal. Subsegmental atelectasis RIGHT middle lobe. Lungs otherwise clear. No infiltrate, pleural effusion or pneumothorax. Bones demineralized. IMPRESSION: Enlargement of cardiac silhouette with pulmonary vascular congestion post CABG. Subsegmental atelectasis RIGHT middle lobe. Electronically Signed   By: Lavonia Dana M.D.   On: 03/20/2015 09:05     EKG: (Independently reviewed)  Sinus tachycardia with ventricular rate 107 bpm, QTC 459 ms, no ischemic changes   Assessment & Plan  Principal Problem:   Acute on chronic respiratory failure with hypoxia 2/2 Asthma exacerbation/suspected Influenza vs CAP -Acute onset of febrile illness with cough chills headache nausea and vomiting highly suspicious for influenza -Admit to medical floor/observation -Chest x-ray does demonstrate right middle lobe atelectasis or could also have pneumonia process -Empiric antibiotics with Levaquin since has penicillin allergy -Begin empiric Tamiflu -Follow up on blood cultures, sputum culture, urinary strep and Legionella antigens -Check influenza PCR -Supportive care with oxygen, IV fluids, antitussive agents, one-time dose of prednisone (see below) -Scheduled DuoNeb nebs with when necessary albuterol inhaler -Incentive spirometry  Active Problems:   Laryngitis, acute -Suspect related to possible viral illness and associated paroxysmal coughing -One-time dose of prednisone and monitor in the event she needs additional dosages/tapering schedule -Treat underlying causes    Thrombocytopenia  -Likely related to acute infectious process -Check urinalysis and culture to  rule out UTI -Follow labs    Diabetes mellitus type 2, controlled  -Holding her metformin -CBG only slightly elevated in setting of acute illness -Check CBGs and provide SSI    HTN  -Moderate controlled -Appear somewhat dehydrated so will hold home medications for now    S/P CABG  -Continue beta blocker    DVT Prophylaxis: Lovenox  Family Communication:   No family bedside  Code Status:  Full code  Condition:  Stable  Discharge disposition: Anticipate discharge back to preadmission environment once symptoms have resolved/improved  Time spent in minutes : 60      ELLIS,ALLISON L. ANP on 03/20/2015 at 12:47 PM  You may contact me by going to [email or web address] - password [email or web address]  I am available from 7a-7p but please confirm I am on the schedule by going to Amion as above.   After 7p please contact night coverage person covering me after hours  [facility]

## 2015-03-20 NOTE — ED Notes (Signed)
Attempted to call report.

## 2015-03-20 NOTE — Progress Notes (Signed)
Influenza PCR POSITIVE for type A- already on Tamiflu  Erin Hearing, ANP

## 2015-03-20 NOTE — ED Provider Notes (Signed)
CSN: [id number]     Arrival date & time 03/20/15  7371 History   First MD Initiated Contact with Patient 03/20/15 [phone]     Chief Complaint  Patient presents with  . Cough      HPI Patient presents with increasing cough nonproductive and shortness of breath started yesterday.  Patient has history of heart palpitations and asthma.  Patient wears oxygen at home when necessary.  Patient denies fever or chills. Past Medical History  Diagnosis Date  . Diabetes mellitus   . Hypertension   . Heart palpitations   . Asthma   . Coronary artery disease    Past Surgical History  Procedure Laterality Date  . Coronary artery bypass graft    . Tonsillectomy    . Cesarean section    . Esophageal manometry N/A 03/06/2012    Procedure: ESOPHAGEAL MANOMETRY (EM);  Surgeon: Beryle Beams, MD;  Location: [facility];  Service: Endoscopy;  Laterality: N/A;   No family history on file. Social History  Substance Use Topics  . Smoking status: Never Smoker   . Smokeless tobacco: None  . Alcohol Use: No   OB History    No data available     Review of Systems  All other systems reviewed and are negative.     Allergies  Penicillins  Home Medications   Prior to Admission medications   Medication Sig Start Date End Date Taking? Authorizing Provider  albuterol (PROVENTIL HFA;VENTOLIN HFA) 108 (90 BASE) MCG/ACT inhaler Inhale 2 puffs into the lungs every 6 (six) hours as needed. For wheezing.    Historical Provider, MD  ALPRAZolam Duanne Moron) 1 MG tablet Take 1 mg by mouth 2 (two) times daily as needed. For anxiety.    Historical Provider, MD  B Complex Vitamins (B COMPLEX PO) Take 1 capsule by mouth daily.    Historical Provider, MD  cephALEXin (KEFLEX) 500 MG capsule Take 1 capsule (500 mg total) by mouth 4 (four) times daily. 11/13/14   Delsa Grana, PA-C  clindamycin (CLEOCIN) 150 MG capsule Take 1 capsule (150 mg total) by mouth every 6 (six) hours. 10/20/14   Nehemiah Settle, NP  fluticasone  (FLONASE) 50 MCG/ACT nasal spray Place 2 sprays into both nostrils 2 (two) times daily. 01/16/13   Liam Graham, PA-C  HYDROcodone-acetaminophen (NORCO/VICODIN) 5-325 MG tablet Take 2 tablets by mouth every 4 (four) hours as needed. 10/20/14   Nehemiah Settle, NP  lisinopril-hydrochlorothiazide (PRINZIDE,ZESTORETIC) 20-12.5 MG per tablet Take 1 tablet by mouth daily.    Historical Provider, MD  metFORMIN (GLUCOPHAGE) 500 MG tablet Take 500 mg by mouth 2 (two) times daily with a meal.    Historical Provider, MD  metoprolol tartrate (LOPRESSOR) 25 MG tablet Take 25 mg by mouth 2 (two) times daily.  04/21/13   Historical Provider, MD  nitroGLYCERIN (NITROSTAT) 0.4 MG SL tablet Place 0.4 mg under the tongue every 5 (five) minutes as needed. For chest pain.    Historical Provider, MD  omeprazole (PRILOSEC) 40 MG capsule 1 BID before meals 06/14/13   Harden Mo, MD  pregabalin (LYRICA) 75 MG capsule Take 1 tablet po at night before bed for 1 week, then may increase to 1 tablet at dinner and 1 tablet before bed thereafter for a total of 2 tabs per day 06/27/13   Bronson Ing, DPM  traMADol (ULTRAM) 50 MG tablet Take by mouth every 6 (six) hours as needed.    Historical Provider, MD   BP  149/65 mmHg  Pulse 104  Temp(Src) 100.2 F (37.9 C) (Rectal)  Resp 16  Ht _0  (1.6 m)  Wt 154 lb 9 oz (70.109 kg)  BMI 27.39 kg/m2  SpO2 100% Physical Exam  Constitutional: She is oriented to person, place, and time. She appears well-developed and well-nourished. No distress.  HENT:  Head: Normocephalic and atraumatic.  Eyes: Pupils are equal, round, and reactive to light.  Neck: Normal range of motion.  Cardiovascular: Normal rate and intact distal pulses.   Pulmonary/Chest: Tachypnea noted. No respiratory distress. She has no wheezes.  Abdominal: Normal appearance. She exhibits no distension.  Musculoskeletal: Normal range of motion.  Neurological: She is alert and oriented to person, place, and time.  No cranial nerve deficit.  Skin: Skin is warm and dry. No rash noted.  Psychiatric: She has a normal mood and affect. Her behavior is normal.  Nursing note and vitals reviewed.   ED Course  Procedures (including critical care time) Labs Review Labs Reviewed  BASIC METABOLIC PANEL - Abnormal; Notable for the following:    Glucose, Bld 168 (*)    All other components within normal limits  CBC - Abnormal; Notable for the following:    RDW 16.0 (*)    Platelets 138 (*)    All other components within normal limits  RESPIRATORY VIRUS PANEL  CULTURE, BLOOD (ROUTINE X 2)  CULTURE, BLOOD (ROUTINE X 2)  CULTURE, EXPECTORATED SPUTUM-ASSESSMENT  GRAM STAIN  BRAIN NATRIURETIC PEPTIDE  INFLUENZA PANEL BY PCR (TYPE A & B, H1N1)  HIV ANTIBODY (ROUTINE TESTING)  STREP PNEUMONIAE URINARY ANTIGEN  LEGIONELLA ANTIGEN, URINE  I-STAT TROPOININ, ED    Imaging Review Dg Chest 2 View  03/20/2015  CLINICAL DATA:  Dry cough with mid chest pain, fever, nasal congestion and weakness, onset of symptoms 1 day ago, hypertension, diabetes mellitus, asthma, coronary artery disease EXAM: CHEST  2 VIEW COMPARISON:  None FINDINGS: Enlargement of cardiac silhouette post CABG. Pulmonary vascular congestion. Mediastinal contours normal. Subsegmental atelectasis RIGHT middle lobe. Lungs otherwise clear. No infiltrate, pleural effusion or pneumothorax. Bones demineralized. IMPRESSION: Enlargement of cardiac silhouette with pulmonary vascular congestion post CABG. Subsegmental atelectasis RIGHT middle lobe. Electronically Signed   By: Lavonia Dana M.D.   On: 03/20/2015 09:05   I have personally reviewed and evaluated these images and lab results as part of my medical decision-making.   EKG Interpretation   Date/Time:  Thursday March 20 2015 08:25:39 EST Ventricular Rate:  107 PR Interval:  194 QRS Duration: 82 QT Interval:  344 QTC Calculation: 459 R Axis:   60 Text Interpretation:  Sinus tachycardia Otherwise  normal ECG Confirmed by  Francella Barnett  MD, Jarah Pember ([id number]) on 03/20/2015 9:38:38 AM      MDM   Final diagnoses:  Fever, unspecified fever cause  Influenza        Leonard Schwartz, MD 03/20/15 1217

## 2015-03-21 DIAGNOSIS — E119 Type 2 diabetes mellitus without complications: Secondary | ICD-10-CM | POA: Diagnosis not present

## 2015-03-21 DIAGNOSIS — I10 Essential (primary) hypertension: Secondary | ICD-10-CM | POA: Diagnosis not present

## 2015-03-21 DIAGNOSIS — J111 Influenza due to unidentified influenza virus with other respiratory manifestations: Secondary | ICD-10-CM

## 2015-03-21 DIAGNOSIS — J9621 Acute and chronic respiratory failure with hypoxia: Secondary | ICD-10-CM | POA: Diagnosis not present

## 2015-03-21 DIAGNOSIS — J09X2 Influenza due to identified novel influenza A virus with other respiratory manifestations: Secondary | ICD-10-CM | POA: Diagnosis not present

## 2015-03-21 LAB — BASIC METABOLIC PANEL
ANION GAP: 13 (ref 5–15)
BUN: 13 mg/dL (ref 6–20)
CHLORIDE: 104 mmol/L (ref 101–111)
CO2: 22 mmol/L (ref 22–32)
Calcium: 8.5 mg/dL — ABNORMAL LOW (ref 8.9–10.3)
Creatinine, Ser: 0.78 mg/dL (ref 0.44–1.00)
Glucose, Bld: 175 mg/dL — ABNORMAL HIGH (ref 65–99)
POTASSIUM: 3.4 mmol/L — AB (ref 3.5–5.1)
SODIUM: 139 mmol/L (ref 135–145)

## 2015-03-21 LAB — GLUCOSE, CAPILLARY
GLUCOSE-CAPILLARY: 257 mg/dL — AB (ref 65–99)
Glucose-Capillary: 147 mg/dL — ABNORMAL HIGH (ref 65–99)

## 2015-03-21 LAB — CBC
HEMATOCRIT: 37.1 % (ref 36.0–46.0)
HEMOGLOBIN: 11.8 g/dL — AB (ref 12.0–15.0)
MCH: 25.3 pg — ABNORMAL LOW (ref 26.0–34.0)
MCHC: 31.8 g/dL (ref 30.0–36.0)
MCV: 79.4 fL (ref 78.0–100.0)
Platelets: 135 10*3/uL — ABNORMAL LOW (ref 150–400)
RBC: 4.67 MIL/uL (ref 3.87–5.11)
RDW: 16.1 % — AB (ref 11.5–15.5)
WBC: 4.4 10*3/uL (ref 4.0–10.5)

## 2015-03-21 LAB — LEGIONELLA ANTIGEN, URINE

## 2015-03-21 LAB — HEMOGLOBIN A1C
Hgb A1c MFr Bld: 7.8 % — ABNORMAL HIGH (ref 4.8–5.6)
Mean Plasma Glucose: 177 mg/dL

## 2015-03-21 LAB — HIV ANTIBODY (ROUTINE TESTING W REFLEX): HIV Screen 4th Generation wRfx: NONREACTIVE

## 2015-03-21 MED ORDER — POTASSIUM CHLORIDE CRYS ER 20 MEQ PO TBCR
40.0000 meq | EXTENDED_RELEASE_TABLET | Freq: Once | ORAL | Status: AC
  Administered 2015-03-21: 40 meq via ORAL
  Filled 2015-03-21: qty 2

## 2015-03-21 MED ORDER — BENZONATATE 100 MG PO CAPS
100.0000 mg | ORAL_CAPSULE | Freq: Three times a day (TID) | ORAL | Status: DC
  Administered 2015-03-21 – 2015-03-22 (×4): 100 mg via ORAL
  Filled 2015-03-21 (×4): qty 1

## 2015-03-21 MED ORDER — HYDROCODONE-ACETAMINOPHEN 5-325 MG PO TABS
1.0000 | ORAL_TABLET | Freq: Four times a day (QID) | ORAL | Status: DC | PRN
Start: 2015-03-21 — End: 2015-03-22

## 2015-03-21 MED ORDER — SODIUM CHLORIDE 0.9 % IV BOLUS (SEPSIS)
500.0000 mL | Freq: Once | INTRAVENOUS | Status: AC
  Administered 2015-03-21: 500 mL via INTRAVENOUS

## 2015-03-21 MED ORDER — ALBUTEROL SULFATE (2.5 MG/3ML) 0.083% IN NEBU: 2.5000 mg | INHALATION_SOLUTION | RESPIRATORY_TRACT | Status: DC | PRN

## 2015-03-21 MED ORDER — PREDNISONE 50 MG PO TABS
60.0000 mg | ORAL_TABLET | Freq: Every day | ORAL | Status: DC
  Administered 2015-03-21 – 2015-03-22 (×2): 60 mg via ORAL
  Filled 2015-03-21 (×3): qty 1

## 2015-03-21 NOTE — Progress Notes (Signed)
Inpatient Diabetes Program Recommendations  AACE/ADA: New Consensus Statement on Inpatient Glycemic Control (2015)  Target Ranges:  Prepandial:   less than 140 mg/dL      Peak postprandial:   less than 180 mg/dL (1-2 hours)      Critically ill patients:  140 - 180 mg/dL   Review of Glycemic Control:  Results for CALIROSE, MCCANCE (MRN [id number]) as of 03/21/2015 11:58  Ref. Range 03/20/2015 16:45 03/20/2015 22:22 03/21/2015 07:55  Glucose-Capillary Latest Ref Range: 65-99 mg/dL 191 (H) 213 (H) 147 (H)   Diabetes history: Diabetes Mellitus Outpatient Diabetes medications: Metformin 500 mg bid Current orders for Inpatient glycemic control:  Novolog sensitive tid with meals and HS, Prednisone 60 mg with breakfast  Inpatient Diabetes Program Recommendations:   May consider adding Novolog meal coverage 3 units tid with meals while patient is on PO Prednisone-Hold if patient eats less than 50%.  Thanks, Adah Perl, RN, BC-ADM Inpatient Diabetes Coordinator Pager [phone] (8a-5p)

## 2015-03-21 NOTE — Progress Notes (Signed)
[facility] PROGRESS NOTE  Sharon Spears XTK:[id number] DOB: [birth year] DOA: 03/20/2015  PCP: Beryle Beams, MD  Brief HPI: 70 year old female of Hispanic origin with past medical history of asthma with occasional use oxygen at home, along with history of hypertension and diabetes, presented fever, chills, cough. She was noted have a temperature up to 102F. Chest x-ray showed subsegmental atelectasis in the right middle lobe. She was hospitalized for further management.  Past medical history:  Past Medical History  Diagnosis Date  . Diabetes mellitus   . Hypertension   . Heart palpitations   . Asthma   . Coronary artery disease     Consultants: None  Procedures: None  Antibiotics: Tamiflu Levaquin  Subjective: Patient continues to have dry coughing spells. Denies any chest pain. Has some shortness of breath and wheezing. No nausea or vomiting.  Objective: Vital Signs  Filed Vitals:   03/20/15 2014 03/20/15 2155 03/21/15 0535 03/21/15 0634  BP:  147/63 135/61   Pulse:  94 108   Temp:  99.8 F (37.7 C) 102.4 F (39.1 C)   TempSrc:  Oral Oral   Resp:  18 20   Height:      Weight:      SpO2: 98% 97% 95% 94%   No intake or output data in the 24 hours ending 03/21/15 0929 Filed Weights   03/20/15 0835  Weight: 70.109 kg (154 lb 9 oz)    General appearance: alert, cooperative, appears stated age and no distress Resp: Scattered wheezes bilaterally. Few crackles at the bases, right more than left. No rhonchi. Cardio: regular rate and rhythm, S1, S2 normal, no murmur, click, rub or gallop GI: soft, non-tender; bowel sounds normal; no masses,  no organomegaly Extremities: extremities normal, atraumatic, no cyanosis or edema Neurologic: Awake and alert. Oriented 3. No focal neurological deficits are noted.  Lab Results:  Basic Metabolic Panel:  Recent Labs Lab 03/20/15 0901 03/21/15 0817  NA 139 139  K 4.1 3.4*  CL 104 104  CO2 23 22  GLUCOSE 168*  175*  BUN 14 13  CREATININE 0.73 0.78  CALCIUM 9.2 8.5*   CBC:  Recent Labs Lab 03/20/15 0901 03/21/15 0817  WBC 5.0 4.4  HGB 12.6 11.8*  HCT 38.1 37.1  MCV 79.5 79.4  PLT 138* 135*   BNP (last 3 results)  Recent Labs  03/20/15 0955  BNP 36.1    CBG:  Recent Labs Lab 03/20/15 1645 03/20/15 2222 03/21/15 0755  GLUCAP 191* 213* 147*    No results found for this or any previous visit (from the past 240 hour(s)).    Studies/Results: Dg Chest 2 View  03/20/2015  CLINICAL DATA:  Dry cough with mid chest pain, fever, nasal congestion and weakness, onset of symptoms 1 day ago, hypertension, diabetes mellitus, asthma, coronary artery disease EXAM: CHEST  2 VIEW COMPARISON:  None FINDINGS: Enlargement of cardiac silhouette post CABG. Pulmonary vascular congestion. Mediastinal contours normal. Subsegmental atelectasis RIGHT middle lobe. Lungs otherwise clear. No infiltrate, pleural effusion or pneumothorax. Bones demineralized. IMPRESSION: Enlargement of cardiac silhouette with pulmonary vascular congestion post CABG. Subsegmental atelectasis RIGHT middle lobe. Electronically Signed   By: Lavonia Dana M.D.   On: 03/20/2015 09:05    Medications:  Scheduled: . aspirin EC  81 mg Oral Daily  . benzonatate  100 mg Oral TID  . enoxaparin (LOVENOX) injection  40 mg Subcutaneous Q24H  . famotidine  20 mg Oral BID  . fluticasone  2 spray Each Nare  BID  . insulin aspart  0-5 Units Subcutaneous QHS  . insulin aspart  0-9 Units Subcutaneous TID WC  . ipratropium-albuterol  3 mL Nebulization BID  . levofloxacin (LEVAQUIN) IV  750 mg Intravenous Q24H  . metoprolol tartrate  25 mg Oral BID  . oseltamivir  75 mg Oral BID  . predniSONE  60 mg Oral Q breakfast   Continuous: . sodium chloride 50 mL/hr at 03/20/15 1418   QQP:[id number] **OR** acetaminophen, albuterol, ALPRAZolam, HYDROcodone-homatropine, ondansetron **OR** ondansetron (ZOFRAN) IV  Assessment/Plan:  Principal  Problem:   Acute on chronic respiratory failure with hypoxia (HCC) Active Problems:   Asthma exacerbation   Laryngitis, acute   Thrombocytopenia (HCC)   Diabetes mellitus type 2, controlled ([place])   HTN (hypertension)   S/P CABG (coronary artery bypass graft)    Acute on chronic respiratory failure with hypoxia 2/2 Asthma exacerbation/Influenza/CAP Influenza PCR was positive. Patient is on Tamiflu. Chest x-ray did show some right-sided atelectasis. Due to severity of the patient's symptoms. She was also started empirically on Levaquin. Continue both of these agents for now. Pro Calcitonin less than 0.1. HIV is nonreactive. Follow up on blood cultures, sputum culture. Urinary strep and Legionella antigens are negative. Continue steroids.  Laryngitis, acute Suspect related to possible viral illness and associated paroxysmal coughing. Treat underlying causes  Thrombocytopenia  Likely related to acute infectious process. Improved this morning. Continue to monitor. Note was bleeding.  Diabetes mellitus type 2, controlled  Holding her metformin. Check CBGs and provide SSI. HbA1c is 7.8.  Essential HTN  Holding her home agents. Continue to monitor for now.  CAD S/P CABG  Stable. Denies any chest pain. Continue beta blocker  DVT Prophylaxis: Lovenox    Code Status: Full code  Family Communication: Discussed with the patient  Disposition Plan: Continue management as outlined above.      [facility]  [facility] Pager [phone] 03/21/2015, 9:29 AM  If 7PM-7AM, please contact night-coverage at [email or web address], password [facility]

## 2015-03-22 DIAGNOSIS — J09X2 Influenza due to identified novel influenza A virus with other respiratory manifestations: Secondary | ICD-10-CM | POA: Diagnosis not present

## 2015-03-22 DIAGNOSIS — J111 Influenza due to unidentified influenza virus with other respiratory manifestations: Secondary | ICD-10-CM | POA: Diagnosis not present

## 2015-03-22 LAB — URINE CULTURE: Culture: 2000

## 2015-03-22 LAB — RESPIRATORY VIRUS PANEL
ADENOVIRUS: NEGATIVE
INFLUENZA B 1: NEGATIVE
Influenza A: POSITIVE — AB
METAPNEUMOVIRUS: NEGATIVE
Parainfluenza 1: NEGATIVE
Parainfluenza 2: NEGATIVE
Parainfluenza 3: NEGATIVE
RESPIRATORY SYNCYTIAL VIRUS A: NEGATIVE
Respiratory Syncytial Virus B: NEGATIVE
Rhinovirus: NEGATIVE

## 2015-03-22 LAB — BASIC METABOLIC PANEL
Anion gap: 10 (ref 5–15)
BUN: 15 mg/dL (ref 6–20)
CHLORIDE: 108 mmol/L (ref 101–111)
CO2: 22 mmol/L (ref 22–32)
CREATININE: 0.66 mg/dL (ref 0.44–1.00)
Calcium: 8.7 mg/dL — ABNORMAL LOW (ref 8.9–10.3)
GFR calc Af Amer: >60 mL/min (ref >60)
GFR calc non Af Amer: >60 mL/min (ref >60)
Glucose, Bld: 111 mg/dL — ABNORMAL HIGH (ref 65–99)
Potassium: 3.7 mmol/L (ref 3.5–5.1)
Sodium: 140 mmol/L (ref 135–145)

## 2015-03-22 LAB — LIPID PANEL
Cholesterol: 168 mg/dL (ref 0–200)
HDL: 38 mg/dL — ABNORMAL LOW (ref >40)
LDL CALC: 109 mg/dL — AB (ref 0–99)
TRIGLYCERIDES: 104 mg/dL (ref <150)
Total CHOL/HDL Ratio: 4.4 RATIO
VLDL: 21 mg/dL (ref 0–40)

## 2015-03-22 LAB — CBC
HEMATOCRIT: 36.7 % (ref 36.0–46.0)
HEMOGLOBIN: 12.3 g/dL (ref 12.0–15.0)
MCH: 26.3 pg (ref 26.0–34.0)
MCHC: 33.5 g/dL (ref 30.0–36.0)
MCV: 78.4 fL (ref 78.0–100.0)
Platelets: 145 10*3/uL — ABNORMAL LOW (ref 150–400)
RBC: 4.68 MIL/uL (ref 3.87–5.11)
RDW: 16.2 % — ABNORMAL HIGH (ref 11.5–15.5)
WBC: 4.6 10*3/uL (ref 4.0–10.5)

## 2015-03-22 LAB — GLUCOSE, CAPILLARY: Glucose-Capillary: 95 mg/dL (ref 65–99)

## 2015-03-22 LAB — PROCALCITONIN: Procalcitonin: 0.12 ng/mL

## 2015-03-22 MED ORDER — OSELTAMIVIR PHOSPHATE 75 MG PO CAPS: 75.0000 mg | ORAL_CAPSULE | Freq: Two times a day (BID) | ORAL | Status: DC

## 2015-03-22 MED ORDER — LEVOFLOXACIN 750 MG PO TABS
750.0000 mg | ORAL_TABLET | Freq: Every day | ORAL | Status: DC
  Administered 2015-03-22: 750 mg via ORAL
  Filled 2015-03-22: qty 1

## 2015-03-22 MED ORDER — LEVOFLOXACIN 750 MG PO TABS: 750.0000 mg | ORAL_TABLET | Freq: Every day | ORAL | Status: DC

## 2015-03-22 MED ORDER — PREDNISONE 20 MG PO TABS: ORAL_TABLET | ORAL | Status: DC

## 2015-03-22 MED ORDER — BENZONATATE 100 MG PO CAPS: 100.0000 mg | ORAL_CAPSULE | Freq: Three times a day (TID) | ORAL | Status: DC

## 2015-03-22 MED ORDER — HYDROCODONE-HOMATROPINE 5-1.5 MG/5ML PO SYRP: 5.0000 mL | ORAL_SOLUTION | ORAL | Status: DC | PRN

## 2015-03-22 NOTE — Progress Notes (Signed)
Patient was discharged home by MD order; discharged instructions review and give to patient and her daughter with care notes and prescriptions; IV DIC; patient will be escorted to the car by a volunteer via wheelchair.

## 2015-03-22 NOTE — Discharge Summary (Signed)
[facility]  Physician Discharge Summary   Patient ID: Sharon Spears MRN: [id number] DOB/AGE: 69/69/1947 69 y.o.  Admit date: 03/20/2015 Discharge date: 03/22/2015  PCP: Charolette Forward, MD  DISCHARGE DIAGNOSES:  Principal Problem:   Acute on chronic respiratory failure with hypoxia ([place]) Active Problems:   Asthma exacerbation   Laryngitis, acute   Thrombocytopenia ([place])   Diabetes mellitus type 2, controlled ([place])   HTN (hypertension)   S/P CABG (coronary artery bypass graft)   RECOMMENDATIONS FOR OUTPATIENT FOLLOW UP: 1. Patient to follow-up with her PCP   DISCHARGE CONDITION: fair  Diet recommendation: As before  [facility] Weights   03/20/15 0835  Weight: 70.109 kg (154 lb 9 oz)    INITIAL HISTORY: 70 year old female of Hispanic origin with past medical history of asthma with occasional use oxygen at home, along with history of hypertension and diabetes, presented fever, chills, cough. She was noted have a temperature up to 102F. Chest x-ray showed subsegmental atelectasis in the right middle lobe. She was hospitalized for further management.  Consultations:  None  Procedures:  None  HOSPITAL COURSE:   Acute on chronic respiratory failure with hypoxia 2/2 Asthma exacerbation/Influenza/CAP Asian has significantly improved. Influenza PCR was positive. Patient was started on Tamiflu. Chest x-ray did show some right-sided atelectasis. Due to severity of the patient's symptoms, she was also started empirically on Levaquin. Pro Calcitonin less than 0.1. HIV is nonreactive. Blood cultures have been negative so far. Urinary strep and Legionella antigens are negative. She'll be discharged on Tamiflu, Levaquin and tapering doses of steroids.  Laryngitis, acute Suspect related to possible viral illness and associated paroxysmal coughing. Improved  Thrombocytopenia  Likely related to acute infectious process. Improved. No bleeding.  Diabetes mellitus type 2,  controlled  HbA1c is 7.8. She may resume her home medications.  Essential HTN  She may resume her home medications.   CAD S/P CABG  Stable. Denies any chest pain. Continue beta blocker  Overall improved. She has been ambulating without any difficulties. She wants to go home. Stable for discharge.   PERTINENT LABS:  The results of significant diagnostics from this hospitalization (including imaging, microbiology, ancillary and laboratory) are listed below for reference.    Microbiology: Recent Results (from the past 240 hour(s))  Urine culture     Status: None   Collection Time: 03/20/15 12:41 PM  Result Value Ref Range Status   Specimen Description URINE, RANDOM  Final   Special Requests NONE  Final   Culture 2,000 COLONIES/mL INSIGNIFICANT GROWTH  Final   Report Status 03/22/2015 FINAL  Final  Culture, blood (routine x 2) Call MD if unable to obtain prior to antibiotics being given     Status: None (Preliminary result)   Collection Time: 03/20/15  1:11 PM  Result Value Ref Range Status   Specimen Description BLOOD LEFT HAND  Final   Special Requests   Final    BOTTLES DRAWN AEROBIC AND ANAEROBIC 10CC BLUE, 5CC RED   Culture NO GROWTH < 24 HOURS  Final   Report Status PENDING  Incomplete  Respiratory virus panel     Status: Abnormal   Collection Time: 03/20/15  1:32 PM  Result Value Ref Range Status   Respiratory Syncytial Virus A Negative Negative Final   Respiratory Syncytial Virus B Negative Negative Final   Influenza A Positive (A) Negative Final    Comment: Subtype: H3   Influenza B Negative Negative Final   Parainfluenza 1 Negative Negative Final   Parainfluenza 2 Negative  Negative Final   Parainfluenza 3 Negative Negative Final   Metapneumovirus Negative Negative Final   Rhinovirus Negative Negative Final   Adenovirus Negative Negative Final    Comment: (NOTE) Performed At: [facility] [address] [place], [place] [id number] Lindon Romp MD  [id number]   Culture, blood (routine x 2) Call MD if unable to obtain prior to antibiotics being given     Status: None (Preliminary result)   Collection Time: 03/20/15  1:50 PM  Result Value Ref Range Status   Specimen Description BLOOD LEFT ANTECUBITAL  Final   Special Requests BOTTLES DRAWN AEROBIC AND ANAEROBIC 5CC  Final   Culture NO GROWTH < 24 HOURS  Final   Report Status PENDING  Incomplete     Labs: Basic Metabolic Panel:  Recent Labs Lab 03/20/15 0901 03/21/15 0817 03/22/15 0608  NA 139 139 140  K 4.1 3.4* 3.7  CL 104 104 108  CO2 _0 GLUCOSE 168* 175* 111*  BUN _1 CREATININE 0.73 0.78 0.66  CALCIUM 9.2 8.5* 8.7*   CBC:  Recent Labs Lab 03/20/15 0901 03/21/15 0817 03/22/15 0608  WBC 5.0 4.4 4.6  HGB 12.6 11.8* 12.3  HCT 38.1 37.1 36.7  MCV 79.5 79.4 78.4  PLT 138* 135* 145*   BNP: BNP (last 3 results)  Recent Labs  03/20/15 0955  BNP 36.1    CBG:  Recent Labs Lab 03/20/15 1645 03/20/15 2222 03/21/15 0755 03/21/15 1815 03/22/15 0752  GLUCAP 191* 213* 147* 257* 95     IMAGING STUDIES Dg Chest 2 View  03/20/2015  CLINICAL DATA:  Dry cough with mid chest pain, fever, nasal congestion and weakness, onset of symptoms 1 day ago, hypertension, diabetes mellitus, asthma, coronary artery disease EXAM: CHEST  2 VIEW COMPARISON:  None FINDINGS: Enlargement of cardiac silhouette post CABG. Pulmonary vascular congestion. Mediastinal contours normal. Subsegmental atelectasis RIGHT middle lobe. Lungs otherwise clear. No infiltrate, pleural effusion or pneumothorax. Bones demineralized. IMPRESSION: Enlargement of cardiac silhouette with pulmonary vascular congestion post CABG. Subsegmental atelectasis RIGHT middle lobe. Electronically Signed   By: Lavonia Dana M.D.   On: 03/20/2015 09:05    DISCHARGE EXAMINATION: Filed Vitals:   03/21/15 1944 03/21/15 2009 03/21/15 2145 03/22/15 0527  BP:   140/80 142/65  Pulse:   103 71  Temp:  98.3 F  (36.8 C) 98.8 F (37.1 C) 98.3 F (36.8 C)  TempSrc:  Oral Oral Oral  Resp:   18 18  Height:      Weight:      SpO2: 94%  94% 100%   General appearance: alert, cooperative, appears stated age and no distress Resp: clear to auscultation bilaterally Cardio: regular rate and rhythm, S1, S2 normal, no murmur, click, rub or gallop GI: soft, non-tender; bowel sounds normal; no masses,  no organomegaly Extremities: extremities normal, atraumatic, no cyanosis or edema  DISPOSITION: Home  Discharge Instructions    Call MD for:  extreme fatigue    Complete by:  As directed      Call MD for:  persistant dizziness or light-headedness    Complete by:  As directed      Call MD for:  persistant nausea and vomiting    Complete by:  As directed      Call MD for:  severe uncontrolled pain    Complete by:  As directed      Call MD for:  temperature >100.4    Complete by:  As directed  Diet Carb Modified    Complete by:  As directed      Discharge instructions    Complete by:  As directed   Please follow up with your PCP next week.   You were cared for by a hospitalist during your hospital stay. If you have any questions about your discharge medications or the care you received while you were in the hospital after you are discharged, you can call the unit and asked to speak with the hospitalist on call if the hospitalist that took care of you is not available. Once you are discharged, your primary care physician will handle any further medical issues. Please note that NO REFILLS for any discharge medications will be authorized once you are discharged, as it is imperative that you return to your primary care physician (or establish a relationship with a primary care physician if you do not have one) for your aftercare needs so that they can reassess your need for medications and monitor your lab values. If you do not have a primary care physician, you can call [phone] for a physician referral.       Increase activity slowly    Complete by:  As directed            ALLERGIES:  Allergies  Allergen Reactions  . Penicillins Other (See Comments)    GI upset     Discharge Medication List as of 03/22/2015 11:42 AM    START taking these medications   Details  benzonatate (TESSALON) 100 MG capsule Take 1 capsule (100 mg total) by mouth 3 (three) times daily., Starting 03/22/2015, Until Discontinued, Print    HYDROcodone-homatropine (HYCODAN) 5-1.5 MG/5ML syrup Take 5 mLs by mouth every 4 (four) hours as needed for cough., Starting 03/22/2015, Until Discontinued, Print    levofloxacin (LEVAQUIN) 750 MG tablet Take 1 tablet (750 mg total) by mouth daily. For 2 more days starting 03/23/15, Starting 03/22/2015, Until Discontinued, Print    oseltamivir (TAMIFLU) 75 MG capsule Take 1 capsule (75 mg total) by mouth 2 (two) times daily. For 3 more days, Starting 03/22/2015, Until Discontinued, Print    predniSONE (DELTASONE) 20 MG tablet Take 2 tablets once daily for 3 days, then take 1 tablet once daily for 3 days, then STOP., Print      CONTINUE these medications which have NOT CHANGED   Details  albuterol (PROVENTIL HFA;VENTOLIN HFA) 108 (90 BASE) MCG/ACT inhaler Inhale 2 puffs into the lungs every 6 (six) hours as needed. For wheezing., Until Discontinued, Historical Med    ALPRAZolam (XANAX) 1 MG tablet Take 1 mg by mouth 2 (two) times daily as needed. For anxiety., Until Discontinued, Historical Med    aspirin 81 MG tablet Take 81 mg by mouth daily., Until Discontinued, Historical Med    B Complex Vitamins (B COMPLEX PO) Take 1 capsule by mouth daily., Until Discontinued, Historical Med    famotidine (PEPCID) 20 MG tablet Take 20 mg by mouth 2 (two) times daily., Starting 02/04/2015, Until Discontinued, Historical Med    fluticasone (FLONASE) 50 MCG/ACT nasal spray Place 2 sprays into both nostrils 2 (two) times daily., Starting 01/16/2013, Until Discontinued, Print     lisinopril-hydrochlorothiazide (PRINZIDE,ZESTORETIC) 20-12.5 MG per tablet Take 1 tablet by mouth daily., Until Discontinued, Historical Med    metFORMIN (GLUCOPHAGE) 500 MG tablet Take 500 mg by mouth 2 (two) times daily with a meal., Until Discontinued, Historical Med    metoprolol tartrate (LOPRESSOR) 25 MG tablet Take 25 mg by mouth  2 (two) times daily. , Starting 04/21/2013, Until Discontinued, Historical Med    nitroGLYCERIN (NITROSTAT) 0.4 MG SL tablet Place 0.4 mg under the tongue every 5 (five) minutes as needed. For chest pain., Until Discontinued, Historical Med    omeprazole (PRILOSEC) 40 MG capsule 1 BID before meals, Normal      STOP taking these medications     cephALEXin (KEFLEX) 500 MG capsule      clindamycin (CLEOCIN) 150 MG capsule      HYDROcodone-acetaminophen (NORCO/VICODIN) 5-325 MG tablet      pregabalin (LYRICA) 75 MG capsule        Follow-up Information    Follow up with Charolette Forward, MD. Schedule an appointment as soon as possible for a visit in 1 week.   Specialty:  Cardiology   Why:  post hospitalization follow up   Contact information:   [address] [place] [address] [phone]       TOTAL DISCHARGE TIME: 35 minutes  [facility] Pager [phone]  03/22/2015, 3:30 PM

## 2015-03-22 NOTE — Discharge Instructions (Signed)

## 2015-03-25 LAB — CULTURE, BLOOD (ROUTINE X 2)
Culture: NO GROWTH
Culture: NO GROWTH

## 2015-05-30 ENCOUNTER — Ambulatory Visit (HOSPITAL_COMMUNITY)
Admission: EM | Admit: 2015-05-30 | Discharge: 2015-05-30 | Disposition: A | Discharge status: Home / Self Care | Payer: Medicare Other | Attending: Emergency Medicine | Admitting: Emergency Medicine

## 2015-05-30 ENCOUNTER — Encounter (HOSPITAL_COMMUNITY): Payer: Self-pay | Admitting: Emergency Medicine

## 2015-05-30 DIAGNOSIS — K209 Esophagitis, unspecified without bleeding: Secondary | ICD-10-CM

## 2015-05-30 NOTE — ED Provider Notes (Signed)
CSN: [id number]     Arrival date & time 05/30/15  1316 History   First MD Initiated Contact with Patient 05/30/15 1411     Chief Complaint  Patient presents with  . Sore Throat   (Consider location/radiation/quality/duration/timing/severity/associated sxs/prior Treatment) HPI History obtained from patient:  Pt presents with the cc PX:TGGYIRSW of throat Duration of symptoms:over 1 year Treatment prior to arrival:has been given multiple Medications without any relief of her symptoms.  Context: Patient states that for over a year now she has had trouble clearing her throat. She saw her cardiologist 2 days this week but he forgot to give her a referral to ear nose and throat. She would like symptomatic clear up the symptoms of the need of to clear her throat and a referral to ENT. She states that she has no other symptoms. Other symptoms include: Pain score: 0 FAMILY HISTORY: Coronary artery disease mother    Past Medical History  Diagnosis Date  . Diabetes mellitus   . Hypertension   . Heart palpitations   . Asthma   . Coronary artery disease    Past Surgical History  Procedure Laterality Date  . Coronary artery bypass graft    . Tonsillectomy    . Cesarean section    . Esophageal manometry N/A 03/06/2012    Procedure: ESOPHAGEAL MANOMETRY (EM);  Surgeon: Beryle Beams, MD;  Location: [facility];  Service: Endoscopy;  Laterality: N/A;   No family history on file. Social History  Substance Use Topics  . Smoking status: Never Smoker   . Smokeless tobacco: None  . Alcohol Use: No   OB History    No data available     Review of Systems  Denies: HEADACHE, NAUSEA, ABDOMINAL PAIN, CHEST PAIN, CONGESTION, DYSURIA, SHORTNESS OF BREATH  Allergies  Penicillins  Home Medications   Prior to Admission medications   Medication Sig Start Date End Date Taking? Authorizing Provider  albuterol (PROVENTIL HFA;VENTOLIN HFA) 108 (90 BASE) MCG/ACT inhaler Inhale 2 puffs into the  lungs every 6 (six) hours as needed. For wheezing.    Historical Provider, MD  ALPRAZolam Duanne Moron) 1 MG tablet Take 1 mg by mouth 2 (two) times daily as needed. For anxiety.    Historical Provider, MD  aspirin 81 MG tablet Take 81 mg by mouth daily.    Historical Provider, MD  B Complex Vitamins (B COMPLEX PO) Take 1 capsule by mouth daily.    Historical Provider, MD  benzonatate (TESSALON) 100 MG capsule Take 1 capsule (100 mg total) by mouth 3 (three) times daily. 03/22/15   Bonnielee Haff, MD  famotidine (PEPCID) 20 MG tablet Take 20 mg by mouth 2 (two) times daily. 02/04/15   Historical Provider, MD  fluticasone (FLONASE) 50 MCG/ACT nasal spray Place 2 sprays into both nostrils 2 (two) times daily. 01/16/13   Freeman Caldron Baker, PA-C  HYDROcodone-homatropine (HYCODAN) 5-1.5 MG/5ML syrup Take 5 mLs by mouth every 4 (four) hours as needed for cough. 03/22/15   Bonnielee Haff, MD  levofloxacin (LEVAQUIN) 750 MG tablet Take 1 tablet (750 mg total) by mouth daily. For 2 more days starting 03/23/15 03/22/15   Bonnielee Haff, MD  lisinopril-hydrochlorothiazide (PRINZIDE,ZESTORETIC) 20-12.5 MG per tablet Take 1 tablet by mouth daily.    Historical Provider, MD  metFORMIN (GLUCOPHAGE) 500 MG tablet Take 500 mg by mouth 2 (two) times daily with a meal.    Historical Provider, MD  metoprolol tartrate (LOPRESSOR) 25 MG tablet Take 25 mg by mouth 2 (two)  times daily.  04/21/13   Historical Provider, MD  nitroGLYCERIN (NITROSTAT) 0.4 MG SL tablet Place 0.4 mg under the tongue every 5 (five) minutes as needed. For chest pain.    Historical Provider, MD  omeprazole (PRILOSEC) 40 MG capsule 1 BID before meals Patient not taking: Reported on 03/20/2015 06/14/13   Harden Mo, MD  oseltamivir (TAMIFLU) 75 MG capsule Take 1 capsule (75 mg total) by mouth 2 (two) times daily. For 3 more days 03/22/15   Bonnielee Haff, MD  predniSONE (DELTASONE) 20 MG tablet Take 2 tablets once daily for 3 days, then take 1 tablet once daily for 3  days, then STOP. 03/22/15   Bonnielee Haff, MD   Meds Ordered and Administered this Visit  Medications - No data to display  BP 144/59 mmHg  Pulse 76  Temp(Src) 98.7 F (37.1 C) (Oral)  Resp 16  SpO2 100% No data found.   Physical Exam NURSES NOTES AND VITAL SIGNS REVIEWED. CONSTITUTIONAL: Well developed, well nourished, no acute distress HEENT: normocephalic, atraumatic EYES: Conjunctiva normal NECK:normal ROM, supple, no adenopathy PULMONARY:No respiratory distress, normal effort ABDOMINAL: Soft, ND, NT BS+, No CVAT MUSCULOSKELETAL: Normal ROM of all extremities,  SKIN: warm and dry without rash PSYCHIATRIC: Mood and affect, behavior are normal  ED Course  Procedures (including critical care time)  Labs Review Labs Reviewed - No data to display  Imaging Review No results found.   Visual Acuity Review  Right Eye Distance:   Left Eye Distance:   Bilateral Distance:    Right Eye Near:   Left Eye Near:    Bilateral Near:      Referral to ENT is given to patient.    MDM   1. Esophagitis     Patient is reassured that there are no issues that require transfer to higher level of care at this time or additional tests. Patient is advised to continue home symptomatic treatment. Patient is advised that if there are new or worsening symptoms to attend the emergency department, contact primary care provider, or return to UC. Instructions of care provided discharged home in stable condition.    THIS NOTE WAS GENERATED USING A VOICE RECOGNITION SOFTWARE PROGRAM. ALL REASONABLE EFFORTS  WERE MADE TO PROOFREAD THIS DOCUMENT FOR ACCURACY.  I have verbally reviewed the discharge instructions with the patient. A printed AVS was given to the patient.  All questions were answered prior to discharge.      Konrad Felix, [place] 05/30/15 [id number]

## 2015-05-30 NOTE — Discharge Instructions (Signed)
Esophagitis Esophagitis is inflammation of the esophagus. The esophagus is the tube that carries food and liquids from your mouth to your stomach. Esophagitis can cause soreness or pain in the esophagus. This condition can make it difficult and painful to swallow.  CAUSES Most causes of esophagitis are not serious. Common causes of this condition include:  Gastroesophageal reflux disease (GERD). This is when stomach contents move back up into the esophagus (reflux).  Repeated vomiting.  An allergic-type reaction, especially caused by food allergies (eosinophilic esophagitis).  Injury to the esophagus by swallowing large pills with or without water, or swallowing certain types of medicines.  Swallowing (ingesting) harmful chemicals, such as household cleaning products.  Heavy alcohol use.  An infection of the esophagus.This most often occurs in people who have a weakened immune system.  Radiation or chemotherapy treatment for cancer.  Certain diseases such as sarcoidosis, Crohn disease, and scleroderma. SYMPTOMS Symptoms of this condition include:  Difficult or painful swallowing.  Pain with swallowing acidic liquids, such as citrus juices.  Pain with burping.  Chest pain.  Difficulty breathing.  Nausea.  Vomiting.  Pain in the abdomen.  Weight loss.  Ulcers in the mouth.  Patches of white material in the mouth (candidiasis).  Fever.  Coughing up blood or vomiting blood.  Stool that is black, tarry, or bright red. DIAGNOSIS Your health care provider will take a medical history and perform a physical exam. You may also have other tests, including:  An endoscopy to examine your stomach and esophagus with a small camera.  A test that measures the acidity level in your esophagus.  A test that measures how much pressure is on your esophagus.  A barium swallow or modified barium swallow to show the shape, size, and functioning of your esophagus.  Allergy  tests. TREATMENT Treatment for this condition depends on the cause of your esophagitis. In some cases, steroids or other medicines may be given to help relieve your symptoms or to treat the underlying cause of your condition. You may have to make some lifestyle changes, such as:  Avoiding alcohol.  Quitting smoking.  Changing your diet.  Exercising.  Changing your sleep habits and your sleep environment. HOME CARE INSTRUCTIONS Take these actions to decrease your discomfort and to help avoid complications. Diet  Follow a diet as recommended by your health care provider. This may involve avoiding foods and drinks such as:  Coffee and tea (with or without caffeine).  Drinks that contain alcohol.  Energy drinks and sports drinks.  Carbonated drinks or sodas.  Chocolate and cocoa.  Peppermint and mint flavorings.  Garlic and onions.  Horseradish.  Spicy and acidic foods, including peppers, chili powder, curry powder, vinegar, hot sauces, and barbecue sauce.  Citrus fruit juices and citrus fruits, such as oranges, lemons, and limes.  Tomato-based foods, such as red sauce, chili, salsa, and pizza with red sauce.  Fried and fatty foods, such as donuts, french fries, potato chips, and high-fat dressings.  High-fat meats, such as hot dogs and fatty cuts of red and white meats, such as rib eye steak, sausage, ham, and bacon.  High-fat dairy items, such as whole milk, butter, and cream cheese.  Eat small, frequent meals instead of large meals.  Avoid drinking large amounts of liquid with your meals.  Avoid eating meals during the 2-3 hours before bedtime.  Avoid lying down right after you eat.  Do not exercise right after you eat.  Avoid foods and drinks that seem to  make your symptoms worse. General Instructions  Pay attention to any changes in your symptoms.  Take over-the-counter and prescription medicines only as told by your health care provider. Do not take  aspirin, ibuprofen, or other NSAIDs unless your health care provider told you to do so.  If you have trouble taking pills, use a pill splitter to decrease the size of the pill. This will decrease the chance of the pill getting stuck or injuring your esophagus on the way down. Also, drink water after you take a pill.  Do not use any tobacco products, including cigarettes, chewing tobacco, and e-cigarettes. If you need help quitting, ask your health care provider.  Wear loose-fitting clothing. Do not wear anything tight around your waist that causes pressure on your abdomen.  Raise (elevate) the head of your bed about 6 inches (15 cm).  Try to reduce your stress, such as with yoga or meditation. If you need help reducing stress, ask your health care provider.  If you are overweight, reduce your weight to an amount that is healthy for you. Ask your health care provider for guidance about a safe weight loss goal.  Keep all follow-up visits as told by your health care provider. This is important. SEEK MEDICAL CARE IF:  You have new symptoms.  You have unexplained weight loss.  You have difficulty swallowing, or it hurts to swallow.  You have wheezing or a persistent cough.  Your symptoms do not improve with treatment.  You have frequent heartburn for more than two weeks. SEEK IMMEDIATE MEDICAL CARE IF:  You have severe pain in your arms, neck, jaw, teeth, or back.  You feel sweaty, dizzy, or light-headed.  You have chest pain or shortness of breath.  You vomit and your vomit looks like blood or coffee grounds.  Your stool is bloody or black.  You have a fever.  You cannot swallow, drink, or eat.   This information is not intended to replace advice given to you by your health care provider. Make sure you discuss any questions you have with your health care provider.   Document Released: 02/05/2004 Document Revised: 09/18/2014 Document Reviewed: 04/24/2014 Elsevier Interactive  Patient Education [employer].

## 2015-05-30 NOTE — ED Notes (Signed)
Sore throat for one year per patient's documentation on information form.  Reports no treatments tried.

## 2015-06-10 DIAGNOSIS — F331 Major depressive disorder, recurrent, moderate: Secondary | ICD-10-CM | POA: Diagnosis not present

## 2015-06-23 ENCOUNTER — Encounter (HOSPITAL_COMMUNITY): Payer: Medicare Other

## 2015-06-23 ENCOUNTER — Emergency Department (HOSPITAL_COMMUNITY)
Admission: EM | Admit: 2015-06-23 | Discharge: 2015-06-23 | Disposition: A | Discharge status: Home / Self Care | Payer: Medicare Other | Attending: Emergency Medicine | Admitting: Emergency Medicine

## 2015-06-23 ENCOUNTER — Encounter (HOSPITAL_COMMUNITY): Payer: Self-pay | Admitting: Vascular Surgery

## 2015-06-23 ENCOUNTER — Emergency Department (HOSPITAL_COMMUNITY): Payer: Medicare Other

## 2015-06-23 ENCOUNTER — Emergency Department (EMERGENCY_DEPARTMENT_HOSPITAL)
Admit: 2015-06-23 | Discharge: 2015-06-23 | Disposition: A | Discharge status: Home / Self Care | Payer: Medicare Other | Attending: Emergency Medicine | Admitting: Emergency Medicine

## 2015-06-23 DIAGNOSIS — I10 Essential (primary) hypertension: Secondary | ICD-10-CM | POA: Insufficient documentation

## 2015-06-23 DIAGNOSIS — I2510 Atherosclerotic heart disease of native coronary artery without angina pectoris: Secondary | ICD-10-CM | POA: Insufficient documentation

## 2015-06-23 DIAGNOSIS — M79606 Pain in leg, unspecified: Secondary | ICD-10-CM

## 2015-06-23 DIAGNOSIS — Z7984 Long term (current) use of oral hypoglycemic drugs: Secondary | ICD-10-CM | POA: Insufficient documentation

## 2015-06-23 DIAGNOSIS — Z79899 Other long term (current) drug therapy: Secondary | ICD-10-CM | POA: Insufficient documentation

## 2015-06-23 DIAGNOSIS — Z951 Presence of aortocoronary bypass graft: Secondary | ICD-10-CM | POA: Insufficient documentation

## 2015-06-23 DIAGNOSIS — M25561 Pain in right knee: Secondary | ICD-10-CM | POA: Diagnosis present

## 2015-06-23 DIAGNOSIS — Z7982 Long term (current) use of aspirin: Secondary | ICD-10-CM | POA: Insufficient documentation

## 2015-06-23 DIAGNOSIS — J45909 Unspecified asthma, uncomplicated: Secondary | ICD-10-CM | POA: Insufficient documentation

## 2015-06-23 LAB — CBC
HCT: 39.2 % (ref 36.0–46.0)
HEMOGLOBIN: 12.5 g/dL (ref 12.0–15.0)
MCH: 25.8 pg — AB (ref 26.0–34.0)
MCHC: 31.9 g/dL (ref 30.0–36.0)
MCV: 80.8 fL (ref 78.0–100.0)
Platelets: 176 10*3/uL (ref 150–400)
RBC: 4.85 MIL/uL (ref 3.87–5.11)
RDW: 14.9 % (ref 11.5–15.5)
WBC: 6.4 10*3/uL (ref 4.0–10.5)

## 2015-06-23 LAB — I-STAT TROPONIN, ED: TROPONIN I, POC: 0 ng/mL (ref 0.00–0.08)

## 2015-06-23 LAB — CBG MONITORING, ED: GLUCOSE-CAPILLARY: 150 mg/dL — AB (ref 65–99)

## 2015-06-23 LAB — BASIC METABOLIC PANEL
ANION GAP: 11 (ref 5–15)
BUN: 17 mg/dL (ref 6–20)
CALCIUM: 9.6 mg/dL (ref 8.9–10.3)
CO2: 25 mmol/L (ref 22–32)
Chloride: 102 mmol/L (ref 101–111)
Creatinine, Ser: 0.68 mg/dL (ref 0.44–1.00)
Glucose, Bld: 165 mg/dL — ABNORMAL HIGH (ref 65–99)
Potassium: 4.4 mmol/L (ref 3.5–5.1)
Sodium: 138 mmol/L (ref 135–145)

## 2015-06-23 MED ORDER — IBUPROFEN 400 MG PO TABS
400.0000 mg | ORAL_TABLET | Freq: Once | ORAL | Status: AC
  Administered 2015-06-23: 400 mg via ORAL
  Filled 2015-06-23: qty 1

## 2015-06-23 MED ORDER — IBUPROFEN 400 MG PO TABS: 400.0000 mg | ORAL_TABLET | Freq: Four times a day (QID) | ORAL | Status: DC | PRN

## 2015-06-23 NOTE — ED Notes (Signed)
CSW provided pt with taxi voucher home.

## 2015-06-23 NOTE — ED Provider Notes (Signed)
CSN: [id number]     Arrival date & time 06/23/15  1033 History   First MD Initiated Contact with Patient 06/23/15 1619     Chief Complaint  Patient presents with  . Knee Pain  . Medication Reaction    HPI   Sharon Spears is an 70 y.o. female with history of DM, HTN, CAD who presents to the ED for evaluation of right knee pain. She states she has had pain for the past week or so. She states she was seen at urgent care in [facility] and started on Gabapentin and Celebrex. She states that everytime she takes the gabapentin and celebrex she gets heart palpitations. She states her pain is not improved. She states they did x-rays of her ankle and told her she has a bone spur but did not get x-rays of her knee. She states she does not remember injuring her knee. However, she does state she is staying at a shelter right now and has to climb in and out of a bunk bed. She is not sure if this triggered her pain. Denies weakness, numbness, tingling. Denies fever or chills. She has not tried anything else to alleviate her symptoms.  Past Medical History  Diagnosis Date  . Diabetes mellitus   . Hypertension   . Heart palpitations   . Asthma   . Coronary artery disease    Past Surgical History  Procedure Laterality Date  . Coronary artery bypass graft    . Tonsillectomy    . Cesarean section    . Esophageal manometry N/A 03/06/2012    Procedure: ESOPHAGEAL MANOMETRY (EM);  Surgeon: Beryle Beams, MD;  Location: [facility];  Service: Endoscopy;  Laterality: N/A;   History reviewed. No pertinent family history. Social History  Substance Use Topics  . Smoking status: Never Smoker   . Smokeless tobacco: None  . Alcohol Use: No   OB History    No data available     Review of Systems  All other systems reviewed and are negative.     Allergies  Penicillins  Home Medications   Prior to Admission medications   Medication Sig Start Date End Date Taking? Authorizing Provider  albuterol  (PROVENTIL HFA;VENTOLIN HFA) 108 (90 BASE) MCG/ACT inhaler Inhale 2 puffs into the lungs every 6 (six) hours as needed. For wheezing.    Historical Provider, MD  ALPRAZolam Duanne Moron) 1 MG tablet Take 1 mg by mouth 2 (two) times daily as needed. For anxiety.    Historical Provider, MD  aspirin 81 MG tablet Take 81 mg by mouth daily.    Historical Provider, MD  B Complex Vitamins (B COMPLEX PO) Take 1 capsule by mouth daily.    Historical Provider, MD  benzonatate (TESSALON) 100 MG capsule Take 1 capsule (100 mg total) by mouth 3 (three) times daily. 03/22/15   Bonnielee Haff, MD  famotidine (PEPCID) 20 MG tablet Take 20 mg by mouth 2 (two) times daily. 02/04/15   Historical Provider, MD  fluticasone (FLONASE) 50 MCG/ACT nasal spray Place 2 sprays into both nostrils 2 (two) times daily. 01/16/13   Freeman Caldron Baker, PA-C  HYDROcodone-homatropine (HYCODAN) 5-1.5 MG/5ML syrup Take 5 mLs by mouth every 4 (four) hours as needed for cough. 03/22/15   Bonnielee Haff, MD  ibuprofen (ADVIL,MOTRIN) 400 MG tablet Take 1 tablet (400 mg total) by mouth every 6 (six) hours as needed. 06/23/15   Olivia Canter Hoyte Ziebell, PA-C  levofloxacin (LEVAQUIN) 750 MG tablet Take 1 tablet (750 mg  total) by mouth daily. For 2 more days starting 03/23/15 03/22/15   Bonnielee Haff, MD  lisinopril-hydrochlorothiazide (PRINZIDE,ZESTORETIC) 20-12.5 MG per tablet Take 1 tablet by mouth daily.    Historical Provider, MD  metFORMIN (GLUCOPHAGE) 500 MG tablet Take 500 mg by mouth 2 (two) times daily with a meal.    Historical Provider, MD  metoprolol tartrate (LOPRESSOR) 25 MG tablet Take 25 mg by mouth 2 (two) times daily.  04/21/13   Historical Provider, MD  nitroGLYCERIN (NITROSTAT) 0.4 MG SL tablet Place 0.4 mg under the tongue every 5 (five) minutes as needed. For chest pain.    Historical Provider, MD  omeprazole (PRILOSEC) 40 MG capsule 1 BID before meals Patient not taking: Reported on 03/20/2015 06/14/13   Harden Mo, MD  oseltamivir (TAMIFLU) 75 MG  capsule Take 1 capsule (75 mg total) by mouth 2 (two) times daily. For 3 more days 03/22/15   Bonnielee Haff, MD  predniSONE (DELTASONE) 20 MG tablet Take 2 tablets once daily for 3 days, then take 1 tablet once daily for 3 days, then STOP. 03/22/15   Bonnielee Haff, MD   BP 164/69 mmHg  Pulse 85  Temp(Src) 98.1 F (36.7 C) (Oral)  Resp 18  Ht _0  (1.6 m)  Wt 69.854 kg  BMI 27.29 kg/m2  SpO2 96% Physical Exam  Constitutional: She is oriented to person, place, and time.  HENT:  Right Ear: External ear normal.  Left Ear: External ear normal.  Nose: Nose normal.  Mouth/Throat: Oropharynx is clear and moist. No oropharyngeal exudate.  Eyes: Conjunctivae and EOM are normal. Pupils are equal, round, and reactive to light.  Neck: Normal range of motion. Neck supple.  Cardiovascular: Normal rate, regular rhythm, normal heart sounds and intact distal pulses.   Pulmonary/Chest: Effort normal and breath sounds normal. No respiratory distress. She has no wheezes. She exhibits no tenderness.  Abdominal: Soft. Bowel sounds are normal. She exhibits no distension. There is no tenderness. There is no rebound and no guarding.  Musculoskeletal: She exhibits no edema.  Right knee exam unremarkable. FROM. No edema. No tenderness. No erythema or warmth.  Neurological: She is alert and oriented to person, place, and time. No cranial nerve deficit.  Skin: Skin is warm and dry.  Psychiatric: She has a normal mood and affect.  Nursing note and vitals reviewed.   ED Course  Procedures (including critical care time) Labs Review Labs Reviewed  BASIC METABOLIC PANEL - Abnormal; Notable for the following:    Glucose, Bld 165 (*)    All other components within normal limits  CBC - Abnormal; Notable for the following:    MCH 25.8 (*)    All other components within normal limits  CBG MONITORING, ED - Abnormal; Notable for the following:    Glucose-Capillary 150 (*)    All other components within normal  limits  I-STAT TROPOININ, ED    Imaging Review Dg Knee Complete 4 Views Right  06/23/2015  CLINICAL DATA:  Knee pain. EXAM: RIGHT KNEE - COMPLETE 4+ VIEW COMPARISON:  None. FINDINGS: No joint effusion identified. There is no fracture or subluxation. Sharpening of the tibial spines noted. Joint spaces are well preserved. IMPRESSION: 1. No acute findings. 2. Sharpening of the tibial spines compatible with mild degenerative change. Electronically Signed   By: Kerby Moors M.D.   On: 06/23/2015 17:26   I have personally reviewed and evaluated these images and lab results as part of my medical decision-making.   EKG  Interpretation None      MDM   Final diagnoses:  Right knee pain    Pt here with right pain that is likely due to arthritis, perhaps worsened by recent stay in shelter requiring bunk bed. Labs were obtained in triage due to complaint of palpitations. Her labs are unremarkable. EKG nonacute. Troponin negative. Her palpitations are likely secondary to her new medications (celebrex and gabapentin). These meds have not alleviated her pain at all so I encouraged pt to d/c until she can follow up with PCP. Doubt ACS. A DVT study was obtained in triage per patient request as she was concerned for a DVT. Wells score 0. DVT study negative. We will d/c her home with rx for ibuprofen and resource guide as pt stated she might need to find a new PCP as she is no longer living in [place]. ER return precautions given.    Anne Ng, PA-C 06/23/15 1904  Nat Christen, MD 06/24/15 [phone]

## 2015-06-23 NOTE — Progress Notes (Signed)
Preliminary results by tech - Venous Duplex Lower Ext. Completed. Negative for deep and superficial vein thrombosis in the right leg. Oda Cogan, BS, RDMS, RVT

## 2015-06-23 NOTE — ED Notes (Signed)
Pt reports to the ED for eval of right knee pain and medication reaction. Pt reports she was seen on 6/7 at [place] and she was dx with arthritis and neuropathic pain and she was given Celebrex and Gabapentin. However, she reports she has not had relief in her pain. Pt also reports that she has had irregular heart rate which is worse as a result of the medication. Pt also reports she wants to have a DVT study done on her right leg and it is swollen. Pt A&Ox4, resp e/u, and skin warm and dry.

## 2015-06-23 NOTE — ED Notes (Signed)
Patient transported to X-ray 

## 2015-06-23 NOTE — Discharge Instructions (Signed)
Stop taking the Celebrex and Gabapentin since they make you feel so unwell. Your knee appears to have some arthritis. Follow up with Dr. Percell Miller for orthopedic evaluation. Return to the ER for new or worsening symptoms.

## 2015-06-23 NOTE — ED Notes (Signed)
PA-C at bedside

## 2015-06-23 NOTE — ED Notes (Signed)
Pt is back from vascular

## 2015-07-10 DIAGNOSIS — M1711 Unilateral primary osteoarthritis, right knee: Secondary | ICD-10-CM | POA: Diagnosis not present

## 2015-07-31 DIAGNOSIS — M25561 Pain in right knee: Secondary | ICD-10-CM | POA: Diagnosis not present

## 2015-07-31 DIAGNOSIS — M5416 Radiculopathy, lumbar region: Secondary | ICD-10-CM | POA: Diagnosis not present

## 2015-07-31 DIAGNOSIS — G8929 Other chronic pain: Secondary | ICD-10-CM | POA: Diagnosis not present

## 2015-07-31 DIAGNOSIS — M1711 Unilateral primary osteoarthritis, right knee: Secondary | ICD-10-CM | POA: Diagnosis not present

## 2015-07-31 DIAGNOSIS — M791 Myalgia: Secondary | ICD-10-CM | POA: Diagnosis not present

## 2015-09-11 DIAGNOSIS — R739 Hyperglycemia, unspecified: Secondary | ICD-10-CM | POA: Diagnosis not present

## 2015-09-11 DIAGNOSIS — I10 Essential (primary) hypertension: Secondary | ICD-10-CM | POA: Diagnosis not present

## 2015-09-11 DIAGNOSIS — R0789 Other chest pain: Secondary | ICD-10-CM | POA: Diagnosis not present

## 2015-09-11 DIAGNOSIS — E7800 Pure hypercholesterolemia, unspecified: Secondary | ICD-10-CM | POA: Diagnosis not present

## 2015-09-11 DIAGNOSIS — E1165 Type 2 diabetes mellitus with hyperglycemia: Secondary | ICD-10-CM | POA: Diagnosis not present

## 2015-09-11 DIAGNOSIS — R079 Chest pain, unspecified: Secondary | ICD-10-CM | POA: Diagnosis not present

## 2015-09-11 DIAGNOSIS — F419 Anxiety disorder, unspecified: Secondary | ICD-10-CM | POA: Diagnosis not present

## 2015-09-11 DIAGNOSIS — M1990 Unspecified osteoarthritis, unspecified site: Secondary | ICD-10-CM | POA: Diagnosis not present

## 2015-09-11 DIAGNOSIS — Z880 Allergy status to penicillin: Secondary | ICD-10-CM | POA: Diagnosis not present

## 2015-09-11 DIAGNOSIS — Z7952 Long term (current) use of systemic steroids: Secondary | ICD-10-CM | POA: Diagnosis not present

## 2015-10-02 DIAGNOSIS — M7051 Other bursitis of knee, right knee: Secondary | ICD-10-CM | POA: Diagnosis not present

## 2015-10-02 DIAGNOSIS — M1711 Unilateral primary osteoarthritis, right knee: Secondary | ICD-10-CM | POA: Diagnosis not present

## 2015-10-21 ENCOUNTER — Encounter: Payer: Self-pay | Admitting: Pediatric Intensive Care

## 2015-10-25 ENCOUNTER — Encounter (HOSPITAL_COMMUNITY): Payer: Self-pay

## 2015-10-25 ENCOUNTER — Ambulatory Visit (HOSPITAL_COMMUNITY)
Admission: EM | Admit: 2015-10-25 | Discharge: 2015-10-25 | Disposition: A | Discharge status: Home / Self Care | Payer: Medicare Other | Attending: Radiology | Admitting: Radiology

## 2015-10-25 DIAGNOSIS — J069 Acute upper respiratory infection, unspecified: Secondary | ICD-10-CM | POA: Diagnosis not present

## 2015-10-25 MED ORDER — FLUTICASONE PROPIONATE 50 MCG/ACT NA SUSP: 2.0000 | Freq: Two times a day (BID) | NASAL | 2 refills | Status: DC

## 2015-10-25 MED ORDER — BENZONATATE 100 MG PO CAPS: 100.0000 mg | ORAL_CAPSULE | Freq: Three times a day (TID) | ORAL | 0 refills | Status: DC

## 2015-10-25 NOTE — ED Notes (Signed)
Patient was discharged by Donavan Burnet, NP

## 2015-10-25 NOTE — ED Triage Notes (Signed)
Patent presents with sough and congestion since last night 10/24/2015. Patient has been taking cold calm to treat symptoms

## 2015-10-25 NOTE — Discharge Instructions (Signed)
Continue to push fluids and take over the counter medications as needed for symptoms releif

## 2015-10-25 NOTE — ED Provider Notes (Signed)
CSN: [id number]     Arrival date & time 10/25/15  1300 History   First MD Initiated Contact with Patient 10/25/15 1442     Chief Complaint  Patient presents with  . Cough  . Nasal Congestion   (Consider location/radiation/quality/duration/timing/severity/associated sxs/prior Treatment) 70 y.o. female presents with nasal congestion and cough X 1 day. Condition is acute  in nature. Condition is made better by flonase. Condition is made worse by cough. Patient states that she is concerned because she had to use her inhaler last night. Patient denies any fevers or sick contacts      Past Medical History:  Diagnosis Date  . Asthma   . Coronary artery disease   . Diabetes mellitus   . Heart palpitations   . Hypertension    Past Surgical History:  Procedure Laterality Date  . CESAREAN SECTION    . CORONARY ARTERY BYPASS GRAFT    . ESOPHAGEAL MANOMETRY N/A 03/06/2012   Procedure: ESOPHAGEAL MANOMETRY (EM);  Surgeon: Beryle Beams, MD;  Location: [facility];  Service: Endoscopy;  Laterality: N/A;  . TONSILLECTOMY     History reviewed. No pertinent family history. Social History  Substance Use Topics  . Smoking status: Never Smoker  . Smokeless tobacco: Never Used  . Alcohol use No   OB History    No data available     Review of Systems  Constitutional: Negative.   HENT: Positive for congestion.   Respiratory: Positive for cough.     Allergies  Penicillins  Home Medications   Prior to Admission medications   Medication Sig Start Date End Date Taking? Authorizing Provider  albuterol (PROVENTIL HFA;VENTOLIN HFA) 108 (90 BASE) MCG/ACT inhaler Inhale 2 puffs into the lungs every 6 (six) hours as needed. For wheezing.   Yes Historical Provider, MD  ALPRAZolam Duanne Moron) 1 MG tablet Take 1 mg by mouth 2 (two) times daily as needed. For anxiety.   Yes Historical Provider, MD  famotidine (PEPCID) 20 MG tablet Take 20 mg by mouth 2 (two) times daily. 02/04/15  Yes Historical  Provider, MD  ibuprofen (ADVIL,MOTRIN) 400 MG tablet Take 1 tablet (400 mg total) by mouth every 6 (six) hours as needed. 06/23/15  Yes Olivia Canter Sam, PA-C  lisinopril-hydrochlorothiazide (PRINZIDE,ZESTORETIC) 20-12.5 MG per tablet Take 1 tablet by mouth daily.   Yes Historical Provider, MD  metFORMIN (GLUCOPHAGE) 500 MG tablet Take 500 mg by mouth 2 (two) times daily with a meal.   Yes Historical Provider, MD  metoprolol tartrate (LOPRESSOR) 25 MG tablet Take 25 mg by mouth 2 (two) times daily.  04/21/13  Yes Historical Provider, MD  aspirin 81 MG tablet Take 81 mg by mouth daily.    Historical Provider, MD  B Complex Vitamins (B COMPLEX PO) Take 1 capsule by mouth daily.    Historical Provider, MD  benzonatate (TESSALON) 100 MG capsule Take 1 capsule (100 mg total) by mouth 3 (three) times daily. 10/25/15   Jacqualine Mau, NP  fluticasone (FLONASE) 50 MCG/ACT nasal spray Place 2 sprays into both nostrils 2 (two) times daily. 10/25/15   Jacqualine Mau, NP  HYDROcodone-homatropine [facility]) 5-1.5 MG/5ML syrup Take 5 mLs by mouth every 4 (four) hours as needed for cough. 03/22/15   Bonnielee Haff, MD  levofloxacin (LEVAQUIN) 750 MG tablet Take 1 tablet (750 mg total) by mouth daily. For 2 more days starting 03/23/15 03/22/15   Bonnielee Haff, MD  nitroGLYCERIN (NITROSTAT) 0.4 MG SL tablet Place 0.4 mg under the tongue  every 5 (five) minutes as needed. For chest pain.    Historical Provider, MD  omeprazole (PRILOSEC) 40 MG capsule 1 BID before meals Patient not taking: Reported on 10/25/2015 06/14/13   Harden Mo, MD  oseltamivir (TAMIFLU) 75 MG capsule Take 1 capsule (75 mg total) by mouth 2 (two) times daily. For 3 more days 03/22/15   Bonnielee Haff, MD  predniSONE (DELTASONE) 20 MG tablet Take 2 tablets once daily for 3 days, then take 1 tablet once daily for 3 days, then STOP. 03/22/15   Bonnielee Haff, MD   Meds Ordered and Administered this Visit  Medications - No data to display  BP  160/67 (BP Location: Left Arm)   Pulse 88   Temp 98.3 F (36.8 C) (Oral)   Resp 12   SpO2 100%  No data found.   Physical Exam  Constitutional: She is oriented to person, place, and time. She appears well-developed and well-nourished.  HENT:  Head: Normocephalic and atraumatic.  Eyes: Conjunctivae are normal.  Neck: Normal range of motion.  Cardiovascular: Normal rate and regular rhythm.   Pulmonary/Chest: Effort normal and breath sounds normal.  Musculoskeletal: Normal range of motion.  Neurological: She is alert and oriented to person, place, and time.  Skin: Skin is warm and dry.  Psychiatric: She has a normal mood and affect.  Nursing note and vitals reviewed.   Urgent Care Course   Clinical Course    Procedures (including critical care time)  Labs Review Labs Reviewed - No data to display  Imaging Review No results found.   Visual Acuity Review  Right Eye Distance:   Left Eye Distance:   Bilateral Distance:    Right Eye Near:   Left Eye Near:    Bilateral Near:         MDM   1. Upper respiratory tract infection, unspecified type        Jacqualine Mau, NP 10/25/15 1503

## 2015-10-27 ENCOUNTER — Encounter: Payer: Self-pay | Admitting: Pediatric Intensive Care

## 2015-10-28 DIAGNOSIS — I340 Nonrheumatic mitral (valve) insufficiency: Secondary | ICD-10-CM | POA: Diagnosis not present

## 2015-10-28 DIAGNOSIS — E785 Hyperlipidemia, unspecified: Secondary | ICD-10-CM | POA: Diagnosis not present

## 2015-10-28 DIAGNOSIS — J069 Acute upper respiratory infection, unspecified: Secondary | ICD-10-CM | POA: Diagnosis not present

## 2015-10-28 DIAGNOSIS — I10 Essential (primary) hypertension: Secondary | ICD-10-CM | POA: Diagnosis not present

## 2015-10-28 DIAGNOSIS — E119 Type 2 diabetes mellitus without complications: Secondary | ICD-10-CM | POA: Diagnosis not present

## 2015-10-28 DIAGNOSIS — J45909 Unspecified asthma, uncomplicated: Secondary | ICD-10-CM | POA: Diagnosis not present

## 2015-11-10 ENCOUNTER — Encounter (HOSPITAL_COMMUNITY): Payer: Self-pay

## 2015-11-10 ENCOUNTER — Emergency Department (HOSPITAL_COMMUNITY): Payer: Medicare Other

## 2015-11-10 ENCOUNTER — Emergency Department (HOSPITAL_COMMUNITY)
Admission: EM | Admit: 2015-11-10 | Discharge: 2015-11-10 | Disposition: A | Discharge status: Home / Self Care | Payer: Medicare Other | Attending: Emergency Medicine | Admitting: Emergency Medicine

## 2015-11-10 DIAGNOSIS — E119 Type 2 diabetes mellitus without complications: Secondary | ICD-10-CM | POA: Diagnosis not present

## 2015-11-10 DIAGNOSIS — Z79899 Other long term (current) drug therapy: Secondary | ICD-10-CM | POA: Diagnosis not present

## 2015-11-10 DIAGNOSIS — I10 Essential (primary) hypertension: Secondary | ICD-10-CM | POA: Insufficient documentation

## 2015-11-10 DIAGNOSIS — Z7982 Long term (current) use of aspirin: Secondary | ICD-10-CM | POA: Insufficient documentation

## 2015-11-10 DIAGNOSIS — J069 Acute upper respiratory infection, unspecified: Secondary | ICD-10-CM | POA: Insufficient documentation

## 2015-11-10 DIAGNOSIS — J4521 Mild intermittent asthma with (acute) exacerbation: Secondary | ICD-10-CM | POA: Diagnosis not present

## 2015-11-10 DIAGNOSIS — R0602 Shortness of breath: Secondary | ICD-10-CM | POA: Diagnosis not present

## 2015-11-10 DIAGNOSIS — R05 Cough: Secondary | ICD-10-CM | POA: Diagnosis not present

## 2015-11-10 DIAGNOSIS — Z951 Presence of aortocoronary bypass graft: Secondary | ICD-10-CM | POA: Insufficient documentation

## 2015-11-10 LAB — CBC WITH DIFFERENTIAL/PLATELET
BASOS ABS: 0 10*3/uL (ref 0.0–0.1)
Basophils Relative: 0 %
EOS ABS: 0.1 10*3/uL (ref 0.0–0.7)
EOS PCT: 1 %
HCT: 40 % (ref 36.0–46.0)
Hemoglobin: 13 g/dL (ref 12.0–15.0)
LYMPHS PCT: 22 %
Lymphs Abs: 2.2 10*3/uL (ref 0.7–4.0)
MCH: 27 pg (ref 26.0–34.0)
MCHC: 32.5 g/dL (ref 30.0–36.0)
MCV: 83 fL (ref 78.0–100.0)
Monocytes Absolute: 0.6 10*3/uL (ref 0.1–1.0)
Monocytes Relative: 6 %
Neutro Abs: 6.9 10*3/uL (ref 1.7–7.7)
Neutrophils Relative %: 71 %
PLATELETS: 201 10*3/uL (ref 150–400)
RBC: 4.82 MIL/uL (ref 3.87–5.11)
RDW: 15.9 % — ABNORMAL HIGH (ref 11.5–15.5)
WBC: 9.6 10*3/uL (ref 4.0–10.5)

## 2015-11-10 LAB — BASIC METABOLIC PANEL
ANION GAP: 10 (ref 5–15)
BUN: 20 mg/dL (ref 6–20)
CO2: 26 mmol/L (ref 22–32)
Calcium: 9.2 mg/dL (ref 8.9–10.3)
Chloride: 104 mmol/L (ref 101–111)
Creatinine, Ser: 0.65 mg/dL (ref 0.44–1.00)
Glucose, Bld: 76 mg/dL (ref 65–99)
POTASSIUM: 4.6 mmol/L (ref 3.5–5.1)
SODIUM: 140 mmol/L (ref 135–145)

## 2015-11-10 LAB — RAPID STREP SCREEN (MED CTR MEBANE ONLY): STREPTOCOCCUS, GROUP A SCREEN (DIRECT): NEGATIVE

## 2015-11-10 MED ORDER — DEXAMETHASONE 4 MG PO TABS
12.0000 mg | ORAL_TABLET | Freq: Once | ORAL | Status: AC
  Administered 2015-11-10: 12 mg via ORAL
  Filled 2015-11-10: qty 3

## 2015-11-10 MED ORDER — KETOROLAC TROMETHAMINE 60 MG/2ML IM SOLN
30.0000 mg | Freq: Once | INTRAMUSCULAR | Status: AC
  Administered 2015-11-10: 30 mg via INTRAMUSCULAR
  Filled 2015-11-10: qty 2

## 2015-11-10 MED ORDER — ALBUTEROL SULFATE (2.5 MG/3ML) 0.083% IN NEBU
2.5000 mg | INHALATION_SOLUTION | Freq: Once | RESPIRATORY_TRACT | Status: AC
  Administered 2015-11-10: 2.5 mg via RESPIRATORY_TRACT
  Filled 2015-11-10: qty 3

## 2015-11-10 MED ORDER — METOPROLOL TARTRATE 25 MG PO TABS
25.0000 mg | ORAL_TABLET | Freq: Once | ORAL | Status: AC
  Administered 2015-11-10: 25 mg via ORAL
  Filled 2015-11-10: qty 1

## 2015-11-10 MED ORDER — LISINOPRIL-HYDROCHLOROTHIAZIDE 20-12.5 MG PO TABS: 1.0000 | ORAL_TABLET | Freq: Every day | ORAL | 0 refills | Status: DC

## 2015-11-10 MED ORDER — LISINOPRIL 20 MG PO TABS
20.0000 mg | ORAL_TABLET | Freq: Once | ORAL | Status: AC
  Administered 2015-11-10: 20 mg via ORAL
  Filled 2015-11-10: qty 1

## 2015-11-10 MED ORDER — ALBUTEROL SULFATE (2.5 MG/3ML) 0.083% IN NEBU: 5.0000 mg | INHALATION_SOLUTION | Freq: Once | RESPIRATORY_TRACT | Status: DC

## 2015-11-10 MED ORDER — METOPROLOL TARTRATE 50 MG PO TABS: 50.0000 mg | ORAL_TABLET | Freq: Two times a day (BID) | ORAL | 0 refills | Status: DC

## 2015-11-10 MED ORDER — PREDNISONE 10 MG PO TABS: 40.0000 mg | ORAL_TABLET | Freq: Every day | ORAL | 0 refills | Status: AC

## 2015-11-10 MED ORDER — HYDROCHLOROTHIAZIDE 25 MG PO TABS
12.5000 mg | ORAL_TABLET | Freq: Once | ORAL | Status: AC
  Administered 2015-11-10: 12.5 mg via ORAL
  Filled 2015-11-10: qty 1

## 2015-11-10 NOTE — ED Triage Notes (Signed)
Per Pt, Pt is coming from home with complaints of SOB that started last night along with congestion and drainage from the nose. Denies cough. Pt has Hx of Asthma.

## 2015-11-10 NOTE — ED Notes (Signed)
Pt was able to walk to RR and back to room without difficulty and maintained a 99% O2 stat

## 2015-11-10 NOTE — ED Provider Notes (Signed)
[facility] DEPT Provider Note   CSN: [id number] Arrival date & time: 11/10/15  1221     History   Chief Complaint Chief Complaint  Patient presents with  . Shortness of Breath    HPI Sharon Spears is a 70 y.o. female.  Patient presents with several days to one week of productive cough, sore throat, and nasal congestion. Reports feeling like her "throat is closing up" due to sensation of post-nasal drip. Reports she has tried Zyrtec and Flonase with no relief. States she is staying at a shelter now and is exposed to smoke which is making her breathing worse. Used her albuterol inhaler today with minimal relief. Denies chest pain, denies shortness of breath at rest here.   The history is provided by the patient. No language interpreter was used.  URI   This is a new problem. The current episode started more than 2 days ago. The problem has not changed since onset.There has been no fever. Associated symptoms include congestion, rhinorrhea, sore throat and cough. Pertinent negatives include no chest pain, no abdominal pain, no nausea and no vomiting. She has tried an inhaler for the symptoms. The treatment provided mild relief.    Past Medical History:  Diagnosis Date  . Asthma   . Coronary artery disease   . Diabetes mellitus   . Heart palpitations   . Hypertension     Patient Active Problem List   Diagnosis Date Noted  . Acute on chronic respiratory failure with hypoxia ([place]) 03/20/2015  . Asthma exacerbation 03/20/2015  . Laryngitis, acute 03/20/2015  . Thrombocytopenia ([place]) 03/20/2015  . Diabetes mellitus type 2, controlled ([place]) 03/20/2015  . HTN (hypertension) 03/20/2015  . S/P CABG (coronary artery bypass graft) 03/20/2015    Past Surgical History:  Procedure Laterality Date  . CESAREAN SECTION    . CORONARY ARTERY BYPASS GRAFT    . ESOPHAGEAL MANOMETRY N/A 03/06/2012   Procedure: ESOPHAGEAL MANOMETRY (EM);  Surgeon: Beryle Beams, MD;  Location: [facility]  ENDOSCOPY;  Service: Endoscopy;  Laterality: N/A;  . TONSILLECTOMY      OB History    No data available       Home Medications    Prior to Admission medications   Medication Sig Start Date End Date Taking? Authorizing Provider  albuterol (PROVENTIL HFA;VENTOLIN HFA) 108 (90 BASE) MCG/ACT inhaler Inhale 2 puffs into the lungs every 6 (six) hours as needed. For wheezing.    Historical Provider, MD  ALPRAZolam Duanne Moron) 1 MG tablet Take 1 mg by mouth 2 (two) times daily as needed. For anxiety.    Historical Provider, MD  aspirin 81 MG tablet Take 81 mg by mouth daily.    Historical Provider, MD  B Complex Vitamins (B COMPLEX PO) Take 1 capsule by mouth daily.    Historical Provider, MD  benzonatate (TESSALON) 100 MG capsule Take 1 capsule (100 mg total) by mouth 3 (three) times daily. 10/25/15   Jacqualine Mau, NP  famotidine (PEPCID) 20 MG tablet Take 20 mg by mouth 2 (two) times daily. 02/04/15   Historical Provider, MD  fluticasone (FLONASE) 50 MCG/ACT nasal spray Place 2 sprays into both nostrils 2 (two) times daily. 10/25/15   Jacqualine Mau, NP  HYDROcodone-homatropine [facility]) 5-1.5 MG/5ML syrup Take 5 mLs by mouth every 4 (four) hours as needed for cough. 03/22/15   Bonnielee Haff, MD  ibuprofen (ADVIL,MOTRIN) 400 MG tablet Take 1 tablet (400 mg total) by mouth every 6 (six) hours as needed. 06/23/15  Olivia Canter Sam, PA-C  levofloxacin (LEVAQUIN) 750 MG tablet Take 1 tablet (750 mg total) by mouth daily. For 2 more days starting 03/23/15 03/22/15   Bonnielee Haff, MD  lisinopril-hydrochlorothiazide (PRINZIDE,ZESTORETIC) 20-12.5 MG per tablet Take 1 tablet by mouth daily.    Historical Provider, MD  lisinopril-hydrochlorothiazide (ZESTORETIC) 20-12.5 MG tablet Take 1 tablet by mouth daily. 11/10/15   Harlin Heys, MD  metFORMIN (GLUCOPHAGE) 500 MG tablet Take 500 mg by mouth 2 (two) times daily with a meal.    Historical Provider, MD  metoprolol (LOPRESSOR) 50 MG tablet Take 1  tablet (50 mg total) by mouth 2 (two) times daily. 11/10/15   Harlin Heys, MD  metoprolol tartrate (LOPRESSOR) 25 MG tablet Take 25 mg by mouth 2 (two) times daily.  04/21/13   Historical Provider, MD  nitroGLYCERIN (NITROSTAT) 0.4 MG SL tablet Place 0.4 mg under the tongue every 5 (five) minutes as needed. For chest pain.    Historical Provider, MD  omeprazole (PRILOSEC) 40 MG capsule 1 BID before meals Patient not taking: Reported on 10/25/2015 06/14/13   Harden Mo, MD  oseltamivir (TAMIFLU) 75 MG capsule Take 1 capsule (75 mg total) by mouth 2 (two) times daily. For 3 more days 03/22/15   Bonnielee Haff, MD  predniSONE (DELTASONE) 10 MG tablet Take 4 tablets (40 mg total) by mouth daily. 11/11/15 11/14/15  Harlin Heys, MD  predniSONE (DELTASONE) 20 MG tablet Take 2 tablets once daily for 3 days, then take 1 tablet once daily for 3 days, then STOP. 03/22/15   Bonnielee Haff, MD    Family History No family history on file.  Social History Social History  Substance Use Topics  . Smoking status: Never Smoker  . Smokeless tobacco: Never Used  . Alcohol use No     Allergies   Penicillins   Review of Systems Review of Systems  Constitutional: Positive for chills. Negative for fever.  HENT: Positive for congestion, postnasal drip, rhinorrhea and sore throat.   Respiratory: Positive for cough and shortness of breath.   Cardiovascular: Negative for chest pain.  Gastrointestinal: Negative for abdominal pain, nausea and vomiting.  Genitourinary: Negative.   Musculoskeletal: Negative.   Skin: Negative.   Neurological: Negative.   Hematological: Does not bruise/bleed easily.  Psychiatric/Behavioral: Negative.      Physical Exam Updated Vital Signs BP 170/99   Pulse 110   Temp 98.4 F (36.9 C) (Oral)   Resp 15   Ht _0  (1.6 m)   Wt 68 kg   SpO2 99%   BMI 26.57 kg/m   Physical Exam  Constitutional: She is oriented to person, place, and time. She appears well-developed  and well-nourished. No distress.  HENT:  Head: Normocephalic and atraumatic.  Nose: Mucosal edema present.  Mouth/Throat: Mucous membranes are normal. No oral lesions. Posterior oropharyngeal erythema present. No oropharyngeal exudate or tonsillar abscesses. No tonsillar exudate.  Eyes: Conjunctivae and EOM are normal. No scleral icterus.  Neck: Normal range of motion. Neck supple.  Cardiovascular: Normal rate, regular rhythm, normal heart sounds and intact distal pulses.  Exam reveals no gallop and no friction rub.   No murmur heard. Pulses:      Radial pulses are 2+ on the right side, and 2+ on the left side.  Trace peripheral edema to mid-calf bilaterally  Pulmonary/Chest: Effort normal. No accessory muscle usage. No tachypnea. No respiratory distress.  End expiratory wheezes at lung bases. Scattered rhonchi. No crackles. Normal work of breathing on room  air.  Neurological: She is alert and oriented to person, place, and time.  Skin: Skin is warm and dry. She is not diaphoretic. No pallor.  Psychiatric: Her speech is normal and behavior is normal. Thought content normal. Cognition and memory are normal.     ED Treatments / Results  Labs (all labs ordered are listed, but only abnormal results are displayed) Labs Reviewed  CBC WITH DIFFERENTIAL/PLATELET - Abnormal; Notable for the following:       Result Value   RDW 15.9 (*)    All other components within normal limits  RAPID STREP SCREEN (NOT AT [facility])  CULTURE, GROUP A STREP [facility])  BASIC METABOLIC PANEL    EKG  EKG Interpretation  Date/Time:  Monday November 10 2015 12:34:00 EDT Ventricular Rate:  98 PR Interval:  204 QRS Duration: 78 QT Interval:  362 QTC Calculation: 462 R Axis:   24 Text Interpretation:  Sinus rhythm with occasional Premature ventricular complexes Possible Anterior infarct , age undetermined Abnormal ECG No significant change since last tracing Confirmed by [facility] MD, PEDRO [phone]) on 11/10/2015  4:40:53 PM       Radiology Dg Chest 2 View  Result Date: 11/10/2015 CLINICAL DATA:  Shortness of breath.  Cough. EXAM: CHEST  2 VIEW COMPARISON:  03/20/2015 FINDINGS: Prior CABG. Stable mild cardiomegaly with normal pulmonary vascularity. Mild basilar subsegmental atelectasis and/or scarring. No focal infiltrate. No pleural effusion or pneumothorax. No acute bony abnormality. IMPRESSION: Prior CABG.  Stable mild cardiomegaly.  No evidence of CHF. Electronically Signed   By: Marcello Moores  Register   On: 11/10/2015 13:08    Procedures Procedures (including critical care time)  Medications Ordered in ED Medications  albuterol (PROVENTIL) (2.5 MG/3ML) 0.083% nebulizer solution 2.5 mg (2.5 mg Nebulization Given 11/10/15 1547)  dexamethasone (DECADRON) tablet 12 mg (12 mg Oral Given 11/10/15 1619)  lisinopril (PRINIVIL,ZESTRIL) tablet 20 mg (20 mg Oral Given 11/10/15 1652)  hydrochlorothiazide (HYDRODIURIL) tablet 12.5 mg (12.5 mg Oral Given 11/10/15 1652)  metoprolol tartrate (LOPRESSOR) tablet 25 mg (25 mg Oral Given 11/10/15 1652)  ketorolac (TORADOL) injection 30 mg (30 mg Intramuscular Given 11/10/15 1653)     Initial Impression / Assessment and Plan / ED Course  I have reviewed the triage vital signs and the nursing notes.  Pertinent labs & imaging results that were available during my care of the patient were reviewed by me and considered in my medical decision making (see chart for details).  Clinical Course    Patient presents with several days subjective shortness of breath, congestion, sore throat, post-nasal drip. She is overall well-appearing and afebrile with normal work of breathing on room air. Vital signs are unremarkable. Chest x-ray clear without pneumonia, pneumothorax, pulmonary edema. She does have end expiratory wheezing on lung exam. Rapid strep negative. Labs are unremarkable. She was given albuterol and a dose of decadron. Symptoms consistent with mild asthma  exacerbation in the setting of viral URI. She was given a dose of 3 days of prednisone to start after 48 hours as she has received decadron, and instructed to continue use of Flonase and OTC allergy medication. She is in good condition for discharge home.  Final Clinical Impressions(s) / ED Diagnoses   Final diagnoses:  Mild intermittent asthma with exacerbation  Viral upper respiratory tract infection    New Prescriptions Discharge Medication List as of 11/10/2015  5:53 PM    START taking these medications   Details  !! lisinopril-hydrochlorothiazide (ZESTORETIC) 20-12.5 MG tablet Take 1 tablet  by mouth daily., Starting Mon 11/10/2015, Print    !! metoprolol (LOPRESSOR) 50 MG tablet Take 1 tablet (50 mg total) by mouth 2 (two) times daily., Starting Mon 11/10/2015, Print    !! predniSONE (DELTASONE) 10 MG tablet Take 4 tablets (40 mg total) by mouth daily., Starting Tue 11/11/2015, Until Fri 11/14/2015, Print     !! - Potential duplicate medications found. Please discuss with provider.       Harlin Heys, MD 11/11/15 [phone]

## 2015-11-10 NOTE — ED Provider Notes (Signed)
I have personally seen and examined the patient. I have reviewed the documentation on PMH/FH/Soc Hx. I have discussed the plan of care with the resident and patient.  I have reviewed and agree with the resident's documentation. Please see associated encounter note.  Briefly the patient is a 70 year old female with a history of asthma who presents to the ED with several days of URI symptoms and sore throat. The patient lives in a shelter and has been exposed to several children with the same symptoms. She denied any fevers or chills. She did however report that she has been needing to use her inhaler more frequently. On arrival patient is afebrile with stable vital signs. She is well-appearing and well-hydrated. Nontoxic appearing. Exam with mild irritation of the posterior oropharynx with no evidence of exudate or lymphadenopathy. Lungs clear to auscultation bilaterally. Chest x-ray without evidence of pneumonia. Rapid strep negative. Given steroids and albuterol. The patient is safe for discharge with strict return precautions.    EKG Interpretation None         Fatima Blank, MD 11/13/15 [phone]

## 2015-11-10 NOTE — ED Notes (Signed)
Pt ambulated to restroom. Pt tolerated well.

## 2015-11-11 ENCOUNTER — Telehealth (HOSPITAL_BASED_OUTPATIENT_CLINIC_OR_DEPARTMENT_OTHER): Payer: Self-pay | Admitting: Emergency Medicine

## 2015-11-11 ENCOUNTER — Telehealth: Payer: Self-pay | Admitting: *Deleted

## 2015-11-11 ENCOUNTER — Encounter: Payer: Self-pay | Admitting: Pediatric Intensive Care

## 2015-11-11 DIAGNOSIS — I10 Essential (primary) hypertension: Secondary | ICD-10-CM | POA: Diagnosis not present

## 2015-11-11 DIAGNOSIS — J45909 Unspecified asthma, uncomplicated: Secondary | ICD-10-CM | POA: Diagnosis not present

## 2015-11-11 DIAGNOSIS — J029 Acute pharyngitis, unspecified: Secondary | ICD-10-CM | POA: Diagnosis not present

## 2015-11-11 DIAGNOSIS — E785 Hyperlipidemia, unspecified: Secondary | ICD-10-CM | POA: Diagnosis not present

## 2015-11-11 DIAGNOSIS — F419 Anxiety disorder, unspecified: Secondary | ICD-10-CM | POA: Diagnosis not present

## 2015-11-11 DIAGNOSIS — E119 Type 2 diabetes mellitus without complications: Secondary | ICD-10-CM | POA: Diagnosis not present

## 2015-11-11 DIAGNOSIS — R05 Cough: Secondary | ICD-10-CM | POA: Diagnosis not present

## 2015-11-11 NOTE — Telephone Encounter (Signed)
Pt called from pharmacy stating Rx was supposed to be faxed but pharmacy does not have the fax.  EDCM reviewed chart to find that [facility] Rx in; asked pharmacy staff to check voicemail.  Called in Rx WAS on voicemail. Pt asked to wait 30 minutes or so.  Pt upset that she was going to have to wait.  No further CM needs identified.

## 2015-11-11 NOTE — Telephone Encounter (Deleted)
Post ED Visit - Positive Culture Follow-up: Successful Patient Follow-Up  Culture assessed and recommendations reviewed by: _0  Elenor Quinones, Pharm.D. _1  Heide Guile, Pharm.D., BCPS _2  Parks Neptune, Pharm.D. _3  Alycia Rossetti, Pharm.D., BCPS _4  [place], [place].D., BCPS, AAHIVP _5  Legrand Como, Pharm.D., BCPS, AAHIVP _6  Milus Glazier, Pharm.D. _7  Stephens November, Pharm.D.  Positive *** culture  _8  Patient discharged without antimicrobial prescription and treatment is now indicated _9  Organism is resistant to prescribed ED discharge antimicrobial _10  Patient with positive blood cultures  Changes discussed with ED provider: *** New antibiotic prescription *** Called to ***  Contacted patient, date ***, time ***   Hazle Nordmann 11/11/2015, 11:06 AM

## 2015-11-13 LAB — CULTURE, GROUP A STREP (THRC)

## 2015-11-20 NOTE — Congregational Nurse Program (Signed)
Congregational Nurse Program Note  Date of Encounter: 10/27/2015  Past Medical History: Past Medical History:  Diagnosis Date  . Asthma   . Coronary artery disease   . Diabetes mellitus   . Heart palpitations   . Hypertension     Encounter Details:     CNP Questionnaire - 11/20/15 1200      Patient Demographics   Is this a new or existing patient? Existing   Patient is considered a/an Not Applicable   Race Latino/Hispanic     Patient Assistance   Location of Patient Assistance [place]   Patient's financial/insurance status Medicaid;Medicare   Uninsured Patient ([facility]) No   Patient referred to apply for the following financial assistance Not Applicable   Food insecurities addressed Not Applicable   Transportation assistance Yes   Type of Assistance Bus Pass Given   Assistance securing medications No   Type of Assistance Other   Educational health offerings Navigating the healthcare system;Hypertension     Encounter Details   Primary purpose of visit Chronic Illness/Condition Visit   Was an Emergency Department visit averted? Not Applicable   Does patient have a medical provider? Yes   Patient referred to Follow up with established PCP   Was a mental health screening completed? (GAINS tool) No   Does patient have dental issues? No   Does patient have vision issues? No   Does your patient have an abnormal blood pressure today? No   Since previous encounter, have you referred patient for abnormal blood pressure that resulted in a new diagnosis or medication change? No   Does your patient have an abnormal blood glucose today? No   Since previous encounter, have you referred patient for abnormal blood glucose that resulted in a new diagnosis or medication change? No   Was there a life-saving intervention made? No     BP check

## 2015-11-20 NOTE — Congregational Nurse Program (Signed)
Congregational Nurse Program Note  Date of Encounter: 11/11/2015  Past Medical History: Past Medical History:  Diagnosis Date  . Asthma   . Coronary artery disease   . Diabetes mellitus   . Heart palpitations   . Hypertension     Encounter Details:     CNP Questionnaire - 11/20/15 1200      Patient Demographics   Is this a new or existing patient? Existing   Patient is considered a/an Not Applicable   Race Latino/Hispanic     Patient Assistance   Location of Patient Assistance [place]   Patient's financial/insurance status Medicaid;Medicare   Uninsured Patient ([facility]) No   Patient referred to apply for the following financial assistance Not Applicable   Food insecurities addressed Not Applicable   Transportation assistance Yes   Type of Assistance Bus Pass Given   Assistance securing medications No   Type of Assistance Other   Educational health offerings Navigating the healthcare system;Hypertension     Encounter Details   Primary purpose of visit Chronic Illness/Condition Visit   Was an Emergency Department visit averted? Not Applicable   Does patient have a medical provider? Yes   Patient referred to Follow up with established PCP   Was a mental health screening completed? (GAINS tool) No   Does patient have dental issues? No   Does patient have vision issues? No   Does your patient have an abnormal blood pressure today? No   Since previous encounter, have you referred patient for abnormal blood pressure that resulted in a new diagnosis or medication change? No   Does your patient have an abnormal blood glucose today? No   Since previous encounter, have you referred patient for abnormal blood glucose that resulted in a new diagnosis or medication change? No   Was there a life-saving intervention made? No     Client states she was in ED with asthma exacerbation. Ed requested that she return for prescriptions and to speak with case manager. Client  will return to Ed and pickup medication. Follow up with CN this week.
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
## 2015-11-21 ENCOUNTER — Encounter: Payer: Self-pay | Admitting: Pediatric Intensive Care

## 2015-11-21 ENCOUNTER — Ambulatory Visit (HOSPITAL_COMMUNITY)
Admission: EM | Admit: 2015-11-21 | Discharge: 2015-11-21 | Disposition: A | Discharge status: Home / Self Care | Payer: Medicare Other | Attending: Emergency Medicine | Admitting: Emergency Medicine

## 2015-11-21 ENCOUNTER — Encounter (HOSPITAL_COMMUNITY): Payer: Self-pay | Admitting: Emergency Medicine

## 2015-11-21 DIAGNOSIS — J4521 Mild intermittent asthma with (acute) exacerbation: Secondary | ICD-10-CM | POA: Diagnosis not present

## 2015-11-21 MED ORDER — IPRATROPIUM-ALBUTEROL 0.5-2.5 (3) MG/3ML IN SOLN
3.0000 mL | Freq: Once | RESPIRATORY_TRACT | Status: AC
  Administered 2015-11-21: 3 mL via RESPIRATORY_TRACT

## 2015-11-21 MED ORDER — METHYLPREDNISOLONE ACETATE 40 MG/ML IJ SUSP
40.0000 mg | Freq: Once | INTRAMUSCULAR | Status: AC
  Administered 2015-11-21: 40 mg via INTRAMUSCULAR

## 2015-11-21 MED ORDER — ALBUTEROL SULFATE HFA 108 (90 BASE) MCG/ACT IN AERS: 1.0000 | INHALATION_SPRAY | Freq: Four times a day (QID) | RESPIRATORY_TRACT | 0 refills | Status: DC | PRN

## 2015-11-21 MED ORDER — IPRATROPIUM-ALBUTEROL 0.5-2.5 (3) MG/3ML IN SOLN
RESPIRATORY_TRACT | Status: AC
  Filled 2015-11-21: qty 3

## 2015-11-21 MED ORDER — AEROCHAMBER PLUS W/MASK MISC: 2 refills | Status: DC

## 2015-11-21 MED ORDER — ALBUTEROL SULFATE (2.5 MG/3ML) 0.083% IN NEBU: 2.5000 mg | INHALATION_SOLUTION | Freq: Four times a day (QID) | RESPIRATORY_TRACT | 12 refills | Status: DC | PRN

## 2015-11-21 MED ORDER — METHYLPREDNISOLONE ACETATE 40 MG/ML IJ SUSP
INTRAMUSCULAR | Status: AC
Start: 2015-11-21 — End: 2015-11-21
  Filled 2015-11-21: qty 1

## 2015-11-21 NOTE — ED Provider Notes (Signed)
CSN: [id number]     Arrival date & time 11/21/15  1111 History   First MD Initiated Contact with Patient 11/21/15 1136     Chief Complaint  Patient presents with  . Asthma   (Consider location/radiation/quality/duration/timing/severity/associated sxs/prior Treatment) HPI Sharon Spears is a 70 y.o. female presenting to UC with c/o gradually worsening SOB secondary to an asthma flare for about 1 month. Pt notes she is currently staying in a shelter with about 80 people including children and smokers, which exacerbates her asthma. She has been taking tessalon, mucinex, and using her inhaler but states she needs a new nebulizer machine as that is the only thing that seems to work.  The fluid collection aspect of her machine is broken.  She notes she recently completed Azithromycin as well as a steroid course so she does not think she needs a repeat of either of those treatments.  Denies fever, chills, n/v/d.    Past Medical History:  Diagnosis Date  . Asthma   . Coronary artery disease   . Diabetes mellitus   . Heart palpitations   . Hypertension    Past Surgical History:  Procedure Laterality Date  . CESAREAN SECTION    . CORONARY ARTERY BYPASS GRAFT    . ESOPHAGEAL MANOMETRY N/A 03/06/2012   Procedure: ESOPHAGEAL MANOMETRY (EM);  Surgeon: Beryle Beams, MD;  Location: [facility];  Service: Endoscopy;  Laterality: N/A;  . TONSILLECTOMY     No family history on file. Social History  Substance Use Topics  . Smoking status: Never Smoker  . Smokeless tobacco: Never Used  . Alcohol use No   OB History    No data available     Review of Systems  Constitutional: Negative for chills and fever.  HENT: Positive for congestion ( mild). Negative for ear pain, sore throat, trouble swallowing and voice change.   Respiratory: Positive for cough and wheezing. Negative for shortness of breath.   Cardiovascular: Negative for chest pain and palpitations.  Gastrointestinal: Negative for  abdominal pain, diarrhea, nausea and vomiting.  Musculoskeletal: Negative for arthralgias, back pain and myalgias.  Skin: Negative for rash.    Allergies  Penicillins  Home Medications   Prior to Admission medications   Medication Sig Start Date End Date Taking? Authorizing Provider  ALPRAZolam Duanne Moron) 1 MG tablet Take 1 mg by mouth 2 (two) times daily as needed. For anxiety.   Yes Historical Provider, MD  B Complex Vitamins (B COMPLEX PO) Take 1 capsule by mouth daily.   Yes Historical Provider, MD  benzonatate (TESSALON) 100 MG capsule Take 1 capsule (100 mg total) by mouth 3 (three) times daily. 10/25/15  Yes Jacqualine Mau, NP  famotidine (PEPCID) 20 MG tablet Take 20 mg by mouth 2 (two) times daily. 02/04/15  Yes Historical Provider, MD  fluticasone (FLONASE) 50 MCG/ACT nasal spray Place 2 sprays into both nostrils 2 (two) times daily. 10/25/15  Yes Jacqualine Mau, NP  HYDROcodone-homatropine (HYCODAN) 5-1.5 MG/5ML syrup Take 5 mLs by mouth every 4 (four) hours as needed for cough. 03/22/15  Yes Bonnielee Haff, MD  lisinopril-hydrochlorothiazide (PRINZIDE,ZESTORETIC) 20-12.5 MG per tablet Take 1 tablet by mouth daily.   Yes Historical Provider, MD  metFORMIN (GLUCOPHAGE) 500 MG tablet Take 500 mg by mouth 2 (two) times daily with a meal.   Yes Historical Provider, MD  metoprolol (LOPRESSOR) 50 MG tablet Take 1 tablet (50 mg total) by mouth 2 (two) times daily. 11/10/15  Yes Harlin Heys, MD  albuterol (PROVENTIL HFA;VENTOLIN HFA) 108 (90 BASE) MCG/ACT inhaler Inhale 2 puffs into the lungs every 6 (six) hours as needed. For wheezing.    Historical Provider, MD  albuterol (PROVENTIL HFA;VENTOLIN HFA) 108 (90 Base) MCG/ACT inhaler Inhale 1-2 puffs into the lungs every 6 (six) hours as needed for wheezing or shortness of breath. 11/21/15   Noland Fordyce, PA-C  albuterol (PROVENTIL) (2.5 MG/3ML) 0.083% nebulizer solution Take 3 mLs (2.5 mg total) by nebulization every 6 (six) hours  as needed for wheezing or shortness of breath. 11/21/15   Noland Fordyce, PA-C  aspirin 81 MG tablet Take 81 mg by mouth daily.    Historical Provider, MD  ibuprofen (ADVIL,MOTRIN) 400 MG tablet Take 1 tablet (400 mg total) by mouth every 6 (six) hours as needed. 06/23/15   Olivia Canter Sam, PA-C  levofloxacin (LEVAQUIN) 750 MG tablet Take 1 tablet (750 mg total) by mouth daily. For 2 more days starting 03/23/15 03/22/15   Bonnielee Haff, MD  lisinopril-hydrochlorothiazide (ZESTORETIC) 20-12.5 MG tablet Take 1 tablet by mouth daily. 11/10/15   Harlin Heys, MD  metoprolol tartrate (LOPRESSOR) 25 MG tablet Take 25 mg by mouth 2 (two) times daily.  04/21/13   Historical Provider, MD  nitroGLYCERIN (NITROSTAT) 0.4 MG SL tablet Place 0.4 mg under the tongue every 5 (five) minutes as needed. For chest pain.    Historical Provider, MD  omeprazole (PRILOSEC) 40 MG capsule 1 BID before meals Patient not taking: Reported on 11/21/2015 06/14/13   Harden Mo, MD  oseltamivir (TAMIFLU) 75 MG capsule Take 1 capsule (75 mg total) by mouth 2 (two) times daily. For 3 more days 03/22/15   Bonnielee Haff, MD  predniSONE (DELTASONE) 20 MG tablet Take 2 tablets once daily for 3 days, then take 1 tablet once daily for 3 days, then STOP. 03/22/15   Bonnielee Haff, MD  Spacer/Aero-Holding Chambers (AEROCHAMBER PLUS WITH MASK) inhaler Use as instructed 11/21/15   Noland Fordyce, PA-C   Meds Ordered and Administered this Visit   Medications  ipratropium-albuterol (DUONEB) 0.5-2.5 (3) MG/3ML nebulizer solution 3 mL (3 mLs Nebulization Given 11/21/15 1149)  methylPREDNISolone acetate (DEPO-MEDROL) injection 40 mg (40 mg Intramuscular Given 11/21/15 1149)    BP 155/71 (BP Location: Right Arm)   Pulse 72   Temp 98.3 F (36.8 C) (Oral)   Resp 16   SpO2 98%  No data found.   Physical Exam  Constitutional: She appears well-developed and well-nourished. No distress.  Pt sitting in exam chair, NAD.  HENT:  Head: Normocephalic  and atraumatic.  Right Ear: Tympanic membrane normal.  Left Ear: Tympanic membrane is scarred.  Nose: Nose normal.  Mouth/Throat: Uvula is midline, oropharynx is clear and moist and mucous membranes are normal.  Eyes: Conjunctivae are normal. No scleral icterus.  Neck: Normal range of motion.  Cardiovascular: Normal rate, regular rhythm and normal heart sounds.   Pulmonary/Chest: Effort normal. No respiratory distress. She has wheezes ( diffuse expiratory). She has no rales. She exhibits no tenderness.  Abdominal: Soft. She exhibits no distension. There is no tenderness.  Musculoskeletal: Normal range of motion.  Neurological: She is alert.  Skin: Skin is warm and dry. She is not diaphoretic.  Nursing note and vitals reviewed.   Urgent Care Course   Clinical Course     Procedures (including critical care time)  Labs Review Labs Reviewed - No data to display  Imaging Review No results found.    MDM   1. Mild intermittent asthma with  acute exacerbation    Pt c/o asthma exacerbation, requesting prescription for a nebulizer machine as hers is broken and she does not feel her inhaler works as well as the machine.  Pt given breathing treatment in UC. Wheeze resolved. Refill of albuterol inhaler with spacer and instructions for spacer use given as this may help pt when she is not able to use her machine. Prescription for 1 nebulizer machine also provided. F/u with PCP as needed.    Noland Fordyce, PA-C 11/21/15 1216

## 2015-11-21 NOTE — ED Triage Notes (Signed)
Here for asthma flare up x1 month.... Reports she was seen at [facility] ED and [facility] mult times for similar sx  Reports nobody has Rx her nebulizer for home treatment  Sx today include: wheezing, SOB  A&O x4... NAD

## 2015-11-24 NOTE — Congregational Nurse Program (Signed)
Congregational Nurse Program Note  Date of Encounter: 11/21/2015  Past Medical History: Past Medical History:  Diagnosis Date  . Asthma   . Coronary artery disease   . Diabetes mellitus   . Heart palpitations   . Hypertension     Encounter Details:     CNP Questionnaire - 11/21/15 0855      Patient Demographics   Is this a new or existing patient? Existing   Patient is considered a/an Not Applicable   Race Latino/Hispanic     Patient Assistance   Location of Patient Assistance [place]   Patient's financial/insurance status Medicare;Medicaid   Uninsured Patient ([facility]) No   Patient referred to apply for the following financial assistance Not Applicable   Food insecurities addressed Not Applicable   Transportation assistance Yes   Type of Assistance Bus Pass Given   Assistance securing medications No   Type of Assistance Other   Educational health offerings Acute disease     Encounter Details   Primary purpose of visit Acute Illness/Condition Visit   Patient referred to Urgent Care   Was a mental health screening completed? (GAINS tool) No   Does patient have dental issues? No   Does patient have vision issues? No   Does your patient have an abnormal blood pressure today? No   Since previous encounter, have you referred patient for abnormal blood pressure that resulted in a new diagnosis or medication change? No   Does your patient have an abnormal blood glucose today? No   Since previous encounter, have you referred patient for abnormal blood glucose that resulted in a new diagnosis or medication change? No   Was there a life-saving intervention made? No      Client states that she is out of her inhaler and that she is having increasing breathing issues. BBS with expiratory wheeze. Client advised to go to urgent care.

## 2016-01-03 ENCOUNTER — Encounter (HOSPITAL_COMMUNITY): Payer: Self-pay | Admitting: Emergency Medicine

## 2016-01-03 ENCOUNTER — Ambulatory Visit (HOSPITAL_COMMUNITY)
Admission: EM | Admit: 2016-01-03 | Discharge: 2016-01-03 | Disposition: A | Discharge status: Home / Self Care | Payer: Medicare Other | Attending: Emergency Medicine | Admitting: Emergency Medicine

## 2016-01-03 DIAGNOSIS — J029 Acute pharyngitis, unspecified: Secondary | ICD-10-CM | POA: Diagnosis not present

## 2016-01-03 MED ORDER — AZITHROMYCIN 250 MG PO TABS: 250.0000 mg | ORAL_TABLET | Freq: Every day | ORAL | 0 refills | Status: DC

## 2016-01-03 NOTE — ED Provider Notes (Signed)
CSN: [id number]     Arrival date & time 01/03/16  1200 History   First MD Initiated Contact with Patient 01/03/16 1222     Chief Complaint  Patient presents with  . Sore Throat   (Consider location/radiation/quality/duration/timing/severity/associated sxs/prior Treatment) Patient c/o sore throat and fever.  She states her throat is very sore.    Sore Throat  This is a new problem. The current episode started yesterday. The problem occurs constantly. The problem has not changed since onset.Nothing aggravates the symptoms. Nothing relieves the symptoms. She has tried nothing for the symptoms.    Past Medical History:  Diagnosis Date  . Asthma   . Coronary artery disease   . Diabetes mellitus   . Heart palpitations   . Hypertension    Past Surgical History:  Procedure Laterality Date  . CESAREAN SECTION    . CORONARY ARTERY BYPASS GRAFT    . ESOPHAGEAL MANOMETRY N/A 03/06/2012   Procedure: ESOPHAGEAL MANOMETRY (EM);  Surgeon: Beryle Beams, MD;  Location: [facility];  Service: Endoscopy;  Laterality: N/A;  . TONSILLECTOMY     History reviewed. No pertinent family history. Social History  Substance Use Topics  . Smoking status: Never Smoker  . Smokeless tobacco: Never Used  . Alcohol use No   OB History    No data available     Review of Systems  Constitutional: Positive for fatigue and fever.  HENT: Positive for sore throat.   Eyes: Negative.   Respiratory: Negative.   Cardiovascular: Negative.   Gastrointestinal: Negative.   Genitourinary: Negative.   Musculoskeletal: Negative.   Allergic/Immunologic: Negative.   Neurological: Negative.   Hematological: Negative.   Psychiatric/Behavioral: Negative.     Allergies  Penicillins  Home Medications   Prior to Admission medications   Medication Sig Start Date End Date Taking? Authorizing Provider  albuterol (PROVENTIL HFA;VENTOLIN HFA) 108 (90 BASE) MCG/ACT inhaler Inhale 2 puffs into the lungs every 6 (six)  hours as needed. For wheezing.   Yes Historical Provider, MD  ALPRAZolam Duanne Moron) 1 MG tablet Take 1 mg by mouth 2 (two) times daily as needed. For anxiety.   Yes Historical Provider, MD  aspirin 81 MG tablet Take 81 mg by mouth daily.   Yes Historical Provider, MD  B Complex Vitamins (B COMPLEX PO) Take 1 capsule by mouth daily.   Yes Historical Provider, MD  benzonatate (TESSALON) 100 MG capsule Take 1 capsule (100 mg total) by mouth 3 (three) times daily. 10/25/15  Yes Jacqualine Mau, NP  famotidine (PEPCID) 20 MG tablet Take 20 mg by mouth 2 (two) times daily. 02/04/15  Yes Historical Provider, MD  fluticasone (FLONASE) 50 MCG/ACT nasal spray Place 2 sprays into both nostrils 2 (two) times daily. 10/25/15  Yes Jacqualine Mau, NP  HYDROcodone-homatropine (HYCODAN) 5-1.5 MG/5ML syrup Take 5 mLs by mouth every 4 (four) hours as needed for cough. 03/22/15  Yes Bonnielee Haff, MD  ibuprofen (ADVIL,MOTRIN) 400 MG tablet Take 1 tablet (400 mg total) by mouth every 6 (six) hours as needed. 06/23/15  Yes Olivia Canter Sam, PA-C  levofloxacin (LEVAQUIN) 750 MG tablet Take 1 tablet (750 mg total) by mouth daily. For 2 more days starting 03/23/15 03/22/15  Yes Bonnielee Haff, MD  lisinopril-hydrochlorothiazide (PRINZIDE,ZESTORETIC) 20-12.5 MG per tablet Take 1 tablet by mouth daily.   Yes Historical Provider, MD  lisinopril-hydrochlorothiazide (ZESTORETIC) 20-12.5 MG tablet Take 1 tablet by mouth daily. 11/10/15  Yes Harlin Heys, MD  metFORMIN (GLUCOPHAGE) 500 MG tablet  Take 500 mg by mouth 2 (two) times daily with a meal.   Yes Historical Provider, MD  metoprolol (LOPRESSOR) 50 MG tablet Take 1 tablet (50 mg total) by mouth 2 (two) times daily. 11/10/15  Yes Harlin Heys, MD  metoprolol tartrate (LOPRESSOR) 25 MG tablet Take 25 mg by mouth 2 (two) times daily.  04/21/13  Yes Historical Provider, MD  nitroGLYCERIN (NITROSTAT) 0.4 MG SL tablet Place 0.4 mg under the tongue every 5 (five) minutes as needed.  For chest pain.   Yes Historical Provider, MD  omeprazole (PRILOSEC) 40 MG capsule 1 BID before meals 06/14/13  Yes Harden Mo, MD  oseltamivir (TAMIFLU) 75 MG capsule Take 1 capsule (75 mg total) by mouth 2 (two) times daily. For 3 more days 03/22/15  Yes Bonnielee Haff, MD  predniSONE (DELTASONE) 20 MG tablet Take 2 tablets once daily for 3 days, then take 1 tablet once daily for 3 days, then STOP. 03/22/15  Yes Bonnielee Haff, MD  Spacer/Aero-Holding Chambers (AEROCHAMBER PLUS WITH MASK) inhaler Use as instructed 11/21/15  Yes Noland Fordyce, PA-C  albuterol (PROVENTIL HFA;VENTOLIN HFA) 108 (90 Base) MCG/ACT inhaler Inhale 1-2 puffs into the lungs every 6 (six) hours as needed for wheezing or shortness of breath. 11/21/15   Noland Fordyce, PA-C  albuterol (PROVENTIL) (2.5 MG/3ML) 0.083% nebulizer solution Take 3 mLs (2.5 mg total) by nebulization every 6 (six) hours as needed for wheezing or shortness of breath. 11/21/15   Noland Fordyce, PA-C  azithromycin (ZITHROMAX) 250 MG tablet Take 1 tablet (250 mg total) by mouth daily. Take first 2 tablets together, then 1 every day until finished. 01/03/16   Lysbeth Penner, FNP   Meds Ordered and Administered this Visit  Medications - No data to display  BP 153/89 (BP Location: Left Arm)   Pulse 84   Temp 98.4 F (36.9 C) (Oral)   Resp 18   SpO2 98%  No data found.   Physical Exam  Constitutional: She appears well-developed and well-nourished.  HENT:  Head: Normocephalic.  Right Ear: External ear normal.  Left Ear: External ear normal.  Nose: Nose normal.  opx injected and no exudates  Eyes: Conjunctivae and EOM are normal. Pupils are equal, round, and reactive to light.  Neck: Normal range of motion. Neck supple.  Cardiovascular: Normal rate, regular rhythm, normal heart sounds and intact distal pulses.   Pulmonary/Chest: Effort normal.  Abdominal: Soft. Bowel sounds are normal.  Nursing note and vitals reviewed.   Urgent Care Course    Clinical Course     Procedures (including critical care time)  Labs Review Labs Reviewed - No data to display  Imaging Review No results found.   Visual Acuity Review  Right Eye Distance:   Left Eye Distance:   Bilateral Distance:    Right Eye Near:   Left Eye Near:    Bilateral Near:         MDM   1. Acute pharyngitis, unspecified etiology    Z-pak as directed Warm Salt water gargles prn Push po fluids, rest, tylenol and motrin otc prn as directed for fever, arthralgias, and myalgias.  Follow up prn if sx's continue or persist.    Lysbeth Penner, FNP 01/03/16 1233

## 2016-01-03 NOTE — ED Triage Notes (Signed)
The patient presented to the [facility] with a complaint of a sore throat that started yesterday. The patient denied fever > 100.4

## 2016-01-14 ENCOUNTER — Emergency Department (HOSPITAL_COMMUNITY)
Admission: EM | Admit: 2016-01-14 | Discharge: 2016-01-15 | Disposition: A | Discharge status: Home / Self Care | Payer: Medicare Other | Attending: Emergency Medicine | Admitting: Emergency Medicine

## 2016-01-14 ENCOUNTER — Encounter (HOSPITAL_COMMUNITY): Payer: Self-pay | Admitting: Emergency Medicine

## 2016-01-14 DIAGNOSIS — Z951 Presence of aortocoronary bypass graft: Secondary | ICD-10-CM | POA: Insufficient documentation

## 2016-01-14 DIAGNOSIS — J029 Acute pharyngitis, unspecified: Secondary | ICD-10-CM | POA: Diagnosis not present

## 2016-01-14 DIAGNOSIS — Z7982 Long term (current) use of aspirin: Secondary | ICD-10-CM | POA: Insufficient documentation

## 2016-01-14 DIAGNOSIS — Z79899 Other long term (current) drug therapy: Secondary | ICD-10-CM | POA: Insufficient documentation

## 2016-01-14 DIAGNOSIS — J45909 Unspecified asthma, uncomplicated: Secondary | ICD-10-CM | POA: Insufficient documentation

## 2016-01-14 DIAGNOSIS — J069 Acute upper respiratory infection, unspecified: Secondary | ICD-10-CM | POA: Diagnosis present

## 2016-01-14 DIAGNOSIS — E119 Type 2 diabetes mellitus without complications: Secondary | ICD-10-CM | POA: Insufficient documentation

## 2016-01-14 DIAGNOSIS — I2510 Atherosclerotic heart disease of native coronary artery without angina pectoris: Secondary | ICD-10-CM | POA: Insufficient documentation

## 2016-01-14 DIAGNOSIS — I10 Essential (primary) hypertension: Secondary | ICD-10-CM | POA: Diagnosis not present

## 2016-01-14 DIAGNOSIS — B349 Viral infection, unspecified: Secondary | ICD-10-CM

## 2016-01-14 NOTE — ED Triage Notes (Signed)
Per eMS:  Pt presents to ED for assessment of cough and fever with generalized body aches since this morning.  Pt whispers at triage due to throat being sore.

## 2016-01-15 ENCOUNTER — Emergency Department (HOSPITAL_COMMUNITY): Payer: Medicare Other

## 2016-01-15 DIAGNOSIS — J029 Acute pharyngitis, unspecified: Secondary | ICD-10-CM | POA: Diagnosis not present

## 2016-01-15 LAB — RAPID STREP SCREEN (MED CTR MEBANE ONLY): Streptococcus, Group A Screen (Direct): NEGATIVE

## 2016-01-15 LAB — I-STAT CHEM 8, ED
BUN: 19 mg/dL (ref 6–20)
CHLORIDE: 104 mmol/L (ref 101–111)
CREATININE: 0.8 mg/dL (ref 0.44–1.00)
Calcium, Ion: 1.2 mmol/L (ref 1.15–1.40)
Glucose, Bld: 105 mg/dL — ABNORMAL HIGH (ref 65–99)
HEMATOCRIT: 41 % (ref 36.0–46.0)
Hemoglobin: 13.9 g/dL (ref 12.0–15.0)
POTASSIUM: 4.2 mmol/L (ref 3.5–5.1)
Sodium: 143 mmol/L (ref 135–145)
TCO2: 26 mmol/L (ref 0–100)

## 2016-01-15 MED ORDER — HYDROCOD POLST-CPM POLST ER 10-8 MG/5ML PO SUER
5.0000 mL | Freq: Once | ORAL | Status: AC
Start: 2016-01-15 — End: 2016-01-15
  Administered 2016-01-15: 5 mL via ORAL
  Filled 2016-01-15: qty 5

## 2016-01-15 MED ORDER — HYDROCOD POLST-CPM POLST ER 10-8 MG/5ML PO SUER: ORAL | 0 refills | Status: DC

## 2016-01-15 NOTE — Discharge Instructions (Signed)
The medication for pain can make you sleepy. Do not drive while taking the medication.

## 2016-01-15 NOTE — ED Provider Notes (Signed)
Patient seen/examined in the Emergency Department in conjunction with Midlevel Provider [facility] Patient reports sore throat, cough and fever Exam : awake/alert, clutching her throat and refusing to speak.   No stridor.  No drooling.  Anterior neck soft.  Uvula midline without edema or exudates.  Mild erythema noted to oropharynx Plan: she agrees to have neck soft tissue xray Also, advised to give oral pain meds     Ripley Fraise, MD 01/15/16 0121

## 2016-01-15 NOTE — ED Provider Notes (Signed)
[facility] DEPT Provider Note   CSN: [id number] Arrival date & time: 01/14/16  2349  By signing my name below, I, [name], attest that this documentation has been prepared under the direction and in the presence of [name], NP. Electronically Signed: [name], ED Scribe. 01/15/2016. 12:14 AM.    History   Chief Complaint Chief Complaint  Patient presents with  . URI    The history is provided by the patient. No language interpreter was used.  URI   This is a new problem. The current episode started 2 days ago. The problem has not changed since onset.Associated symptoms include chest pain (with cough ), nausea, congestion, ear pain, headaches, rhinorrhea, sore throat and cough. Pertinent negatives include no abdominal pain, no vomiting and no rash. She has tried nothing for the symptoms.  HPI Comments:  Sharon Spears is a 71 y.o. female with PMHx of HTN and DM who presents to the Emergency Department complaining of multiple URI symptoms x 2 days. Pt complains of associated sore throat, headache, generalized body aches, rhinorrhea, mild cough, nausea, ear pain, abdominal pain, chest pain. Pt states that her chest pain is worse when coughing and moving. Pt notes that she is whispering due to sore throat. No alleviating factors noted. Pt denies vomiting.    Past Medical History:  Diagnosis Date  . Asthma   . Coronary artery disease   . Diabetes mellitus   . Heart palpitations   . Hypertension     Patient Active Problem List   Diagnosis Date Noted  . Acute on chronic respiratory failure with hypoxia ([place]) 03/20/2015  . Asthma exacerbation 03/20/2015  . Laryngitis, acute 03/20/2015  . Thrombocytopenia ([place]) 03/20/2015  . Diabetes mellitus type 2, controlled ([place]) 03/20/2015  . HTN (hypertension) 03/20/2015  . S/P CABG (coronary artery bypass graft) 03/20/2015    Past Surgical History:  Procedure Laterality Date  . CESAREAN SECTION    . CORONARY  ARTERY BYPASS GRAFT    . ESOPHAGEAL MANOMETRY N/A 03/06/2012   Procedure: ESOPHAGEAL MANOMETRY (EM);  Surgeon: Beryle Beams, MD;  Location: [facility]COPY;  Service: Endoscopy;  Laterality: N/A;  . TONSILLECTOMY      OB History    No data available       Home Medications    Prior to Admission medications   Medication Sig Start Date End Date Taking? Authorizing Provider  albuterol (PROVENTIL HFA;VENTOLIN HFA) 108 (90 BASE) MCG/ACT inhaler Inhale 2 puffs into the lungs every 6 (six) hours as needed. For wheezing.    Historical Provider, MD  albuterol (PROVENTIL HFA;VENTOLIN HFA) 108 (90 Base) MCG/ACT inhaler Inhale 1-2 puffs into the lungs every 6 (six) hours as needed for wheezing or shortness of breath. 11/21/15   Noland Fordyce, PA-C  albuterol (PROVENTIL) (2.5 MG/3ML) 0.083% nebulizer solution Take 3 mLs (2.5 mg total) by nebulization every 6 (six) hours as needed for wheezing or shortness of breath. 11/21/15   Noland Fordyce, PA-C  ALPRAZolam Duanne Moron) 1 MG tablet Take 1 mg by mouth 2 (two) times daily as needed. For anxiety.    Historical Provider, MD  aspirin 81 MG tablet Take 81 mg by mouth daily.    Historical Provider, MD  azithromycin (ZITHROMAX) 250 MG tablet Take 1 tablet (250 mg total) by mouth daily. Take first 2 tablets together, then 1 every day until finished. 01/03/16   Lysbeth Penner, FNP  B Complex Vitamins (B COMPLEX PO) Take 1 capsule by mouth daily.  Historical Provider, MD  benzonatate (TESSALON) 100 MG capsule Take 1 capsule (100 mg total) by mouth 3 (three) times daily. 10/25/15   Jacqualine Mau, NP  chlorpheniramine-HYDROcodone (TUSSIONEX PENNKINETIC ER) 10-8 MG/5ML SUER 5 ml PO every 12 hours as needed for pain/or cough 01/15/16   Hope Bunnie Pion, NP  famotidine (PEPCID) 20 MG tablet Take 20 mg by mouth 2 (two) times daily. 02/04/15   Historical Provider, MD  fluticasone (FLONASE) 50 MCG/ACT nasal spray Place 2 sprays into both nostrils 2 (two) times daily.  10/25/15   Jacqualine Mau, NP  HYDROcodone-homatropine [facility]) 5-1.5 MG/5ML syrup Take 5 mLs by mouth every 4 (four) hours as needed for cough. 03/22/15   Bonnielee Haff, MD  ibuprofen (ADVIL,MOTRIN) 400 MG tablet Take 1 tablet (400 mg total) by mouth every 6 (six) hours as needed. 06/23/15   Olivia Canter Sam, PA-C  levofloxacin (LEVAQUIN) 750 MG tablet Take 1 tablet (750 mg total) by mouth daily. For 2 more days starting 03/23/15 03/22/15   Bonnielee Haff, MD  lisinopril-hydrochlorothiazide (PRINZIDE,ZESTORETIC) 20-12.5 MG per tablet Take 1 tablet by mouth daily.    Historical Provider, MD  lisinopril-hydrochlorothiazide (ZESTORETIC) 20-12.5 MG tablet Take 1 tablet by mouth daily. 11/10/15   Harlin Heys, MD  metFORMIN (GLUCOPHAGE) 500 MG tablet Take 500 mg by mouth 2 (two) times daily with a meal.    Historical Provider, MD  metoprolol (LOPRESSOR) 50 MG tablet Take 1 tablet (50 mg total) by mouth 2 (two) times daily. 11/10/15   Harlin Heys, MD  metoprolol tartrate (LOPRESSOR) 25 MG tablet Take 25 mg by mouth 2 (two) times daily.  04/21/13   Historical Provider, MD  nitroGLYCERIN (NITROSTAT) 0.4 MG SL tablet Place 0.4 mg under the tongue every 5 (five) minutes as needed. For chest pain.    Historical Provider, MD  omeprazole (PRILOSEC) 40 MG capsule 1 BID before meals 06/14/13   Harden Mo, MD  oseltamivir (TAMIFLU) 75 MG capsule Take 1 capsule (75 mg total) by mouth 2 (two) times daily. For 3 more days 03/22/15   Bonnielee Haff, MD  predniSONE (DELTASONE) 20 MG tablet Take 2 tablets once daily for 3 days, then take 1 tablet once daily for 3 days, then STOP. 03/22/15   Bonnielee Haff, MD  Spacer/Aero-Holding Chambers (AEROCHAMBER PLUS WITH MASK) inhaler Use as instructed 11/21/15   Noland Fordyce, PA-C    Family History History reviewed. No pertinent family history.  Social History Social History  Substance Use Topics  . Smoking status: Never Smoker  . Smokeless tobacco: Never Used  .  Alcohol use No     Allergies   Penicillins   Review of Systems Review of Systems  Constitutional: Positive for chills. Negative for fever.  HENT: Positive for congestion, ear pain, rhinorrhea and sore throat.   Eyes: Negative for visual disturbance.  Respiratory: Positive for cough.   Cardiovascular: Positive for chest pain (with cough ).  Gastrointestinal: Positive for nausea. Negative for abdominal pain and vomiting.  Musculoskeletal: Positive for myalgias.  Skin: Negative for rash.  Neurological: Positive for headaches.  Psychiatric/Behavioral: Negative for confusion.  all other systems negative   Physical Exam Updated Vital Signs BP 154/94 (BP Location: Right Arm)   Pulse 94   Temp 98.9 F (37.2 C) (Oral)   Resp 22   SpO2 99%   Physical Exam  Constitutional: She is oriented to person, place, and time. She appears well-developed and well-nourished. No distress.  HENT:  Head: Normocephalic and atraumatic.  Right Ear: Tympanic membrane normal.  Left Ear: Tympanic membrane normal.  Nose: Nose normal.  Mouth/Throat: Uvula is midline and mucous membranes are normal. Posterior oropharyngeal erythema present. No oropharyngeal exudate, posterior oropharyngeal edema or tonsillar abscesses.  TMs normal.  Eyes: Conjunctivae and EOM are normal.  Neck: Normal range of motion. Neck supple.  Cardiovascular: Normal rate and regular rhythm.   Pulmonary/Chest: Effort normal and breath sounds normal.  Abdominal: She exhibits no distension.  Musculoskeletal: Normal range of motion.  Lymphadenopathy:    She has no cervical adenopathy.  Neurological: She is alert and oriented to person, place, and time.  Skin: Skin is warm and dry.  Psychiatric: She has a normal mood and affect.  Nursing note and vitals reviewed.    ED Treatments / Results  DIAGNOSTIC STUDIES:  Oxygen Saturation is 96% on RA, normal by my interpretation.    COORDINATION OF CARE:  12:14 AM Discussed  treatment plan with pt at bedside and pt agreed to plan.   Labs (all labs ordered are listed, but only abnormal results are displayed) Labs Reviewed  I-STAT CHEM 8, ED - Abnormal; Notable for the following:       Result Value   Glucose, Bld 105 (*)    All other components within normal limits  RAPID STREP SCREEN (NOT AT [facility])  CULTURE, GROUP A STREP [facility])  Dr. Christy Gentles in to examine the patient. Will order x-ray of neck. Patient given Tussionex for pain and cough  Radiology Dg Neck Soft Tissue  Result Date: 01/15/2016 CLINICAL DATA:  Sore throat cough and fever EXAM: NECK SOFT TISSUES - 1+ VIEW COMPARISON:  None. FINDINGS: Left carotid artery calcification. Partially visualized sternotomy wires in the upper chest. Lung apices are clear. Moderate severe degenerative changes of the cervical spine from C3 through C6. Prevertebral soft tissue thickness is normal. Epiglottis is unremarkable. Subglottic trachea is patent. IMPRESSION: 1. Negative soft tissue neck 2. Left carotid artery calcification 3. Degenerative changes of the cervical spine Electronically Signed   By: Donavan Foil M.D.   On: 01/15/2016 02:05    Procedures Procedures (including critical care time)  Medications Ordered in ED Medications  chlorpheniramine-HYDROcodone (TUSSIONEX) 10-8 MG/5ML suspension 5 mL (5 mLs Oral Given 01/15/16 0123)   Patient had significant relief of throat pain with Tussionex. She is able to talk now and states she feels better.   Initial Impression / Assessment and Plan / ED Course  Pt symptoms consistent with URI. X-ray soft tissue neck normal. Pt will be discharged with symptomatic treatment.  Discussed return precautions.  Pt is hemodynamically stable & in NAD prior to discharge.  I have reviewed the triage vital signs and the nursing notes.  Pertinent labs & imaging results that were available during my care of the patient were reviewed by me and considered in my medical decision making (see  chart for details).  Clinical Course    Final Clinical Impressions(s) / ED Diagnoses   Final diagnoses:  Pharyngitis with viral syndrome    New Prescriptions New Prescriptions   CHLORPHENIRAMINE-HYDROCODONE (TUSSIONEX PENNKINETIC ER) 10-8 MG/5ML SUER    5 ml PO every 12 hours as needed for pain/or cough  I personally performed the services described in this documentation, which was scribed in my presence. The recorded information has been reviewed and is accurate.     [place], [place] 01/15/16 [place], MD 01/16/16 [phone]

## 2016-01-15 NOTE — ED Notes (Signed)
..  Video Visit [id number] and Handout given to pt. on discharge .

## 2016-01-15 NOTE — ED Notes (Signed)
Patient transported to X-ray .

## 2016-01-17 LAB — CULTURE, GROUP A STREP (THRC)

## 2016-01-21 DIAGNOSIS — E119 Type 2 diabetes mellitus without complications: Secondary | ICD-10-CM | POA: Diagnosis not present

## 2016-01-21 DIAGNOSIS — J029 Acute pharyngitis, unspecified: Secondary | ICD-10-CM | POA: Diagnosis not present

## 2016-01-21 DIAGNOSIS — F419 Anxiety disorder, unspecified: Secondary | ICD-10-CM | POA: Diagnosis not present

## 2016-01-21 DIAGNOSIS — I10 Essential (primary) hypertension: Secondary | ICD-10-CM | POA: Diagnosis not present

## 2016-01-21 DIAGNOSIS — J45909 Unspecified asthma, uncomplicated: Secondary | ICD-10-CM | POA: Diagnosis not present

## 2016-01-21 DIAGNOSIS — E785 Hyperlipidemia, unspecified: Secondary | ICD-10-CM | POA: Diagnosis not present

## 2016-06-02 ENCOUNTER — Other Ambulatory Visit (INDEPENDENT_AMBULATORY_CARE_PROVIDER_SITE_OTHER): Payer: Medicare Other

## 2016-06-02 ENCOUNTER — Encounter: Payer: Self-pay | Admitting: Family

## 2016-06-02 ENCOUNTER — Ambulatory Visit (INDEPENDENT_AMBULATORY_CARE_PROVIDER_SITE_OTHER): Payer: Medicare Other | Admitting: Family

## 2016-06-02 VITALS — BP 126/72 | HR 75 | Temp 98.5°F | Resp 16 | Ht 63.0 in | Wt 158.8 lb

## 2016-06-02 DIAGNOSIS — E119 Type 2 diabetes mellitus without complications: Secondary | ICD-10-CM

## 2016-06-02 DIAGNOSIS — I10 Essential (primary) hypertension: Secondary | ICD-10-CM | POA: Diagnosis not present

## 2016-06-02 DIAGNOSIS — R002 Palpitations: Secondary | ICD-10-CM | POA: Diagnosis not present

## 2016-06-02 LAB — COMPREHENSIVE METABOLIC PANEL
ALT: 61 U/L — AB (ref 0–35)
AST: 62 U/L — AB (ref 0–37)
Albumin: 4.1 g/dL (ref 3.5–5.2)
Alkaline Phosphatase: 94 U/L (ref 39–117)
BILIRUBIN TOTAL: 0.6 mg/dL (ref 0.2–1.2)
BUN: 19 mg/dL (ref 6–23)
CALCIUM: 9.7 mg/dL (ref 8.4–10.5)
CO2: 28 meq/L (ref 19–32)
CREATININE: 0.72 mg/dL (ref 0.40–1.20)
Chloride: 104 mEq/L (ref 96–112)
GFR: 85.17 mL/min (ref >60.00)
GLUCOSE: 227 mg/dL — AB (ref 70–99)
Potassium: 4.6 mEq/L (ref 3.5–5.1)
Sodium: 138 mEq/L (ref 135–145)
Total Protein: 7.4 g/dL (ref 6.0–8.3)

## 2016-06-02 LAB — MICROALBUMIN / CREATININE URINE RATIO
CREATININE, U: 168.1 mg/dL
Microalb Creat Ratio: 2.6 mg/g (ref 0.0–30.0)
Microalb, Ur: 4.4 mg/dL — ABNORMAL HIGH (ref 0.0–1.9)

## 2016-06-02 LAB — HEMOGLOBIN A1C: HEMOGLOBIN A1C: 7.8 % — AB (ref 4.6–6.5)

## 2016-06-02 MED ORDER — METFORMIN HCL ER 500 MG PO TB24: 500.0000 mg | ORAL_TABLET | Freq: Every day | ORAL | 2 refills | Status: DC

## 2016-06-02 NOTE — Progress Notes (Signed)
Subjective:    Patient ID: Sharon Spears, female    DOB: 11-06-1945, 71 y.o.   MRN: [id number]  Chief Complaint  Patient presents with  . Establish Care    states she has been having palpitations and having to use her nitro a couple times a week    HPI:  Sharon Spears is a 71 y.o. female who  has a past medical history of Asthma; Coronary artery disease; Diabetes mellitus; Heart palpitations; and Hypertension. and presents today flor an office visit to establish care.   1.) Diabetes - Prescribed metformin. Reports taking the medication as prescribed and has some mild gastrointestinal distress or hypoglycemic readings. Blood sugars at home averages around 140. Denies new symptoms of end organ damage. No excessive hunger, thirst, or urination. Working on a low/carbohydrate modified oral intake.   Lab Results  Component Value Date   HGBA1C 7.8 (H) 03/20/2015     Lab Results  Component Value Date   CREATININE 0.80 01/15/2016   BUN 19 01/15/2016   NA 143 01/15/2016   K 4.2 01/15/2016   CL 104 01/15/2016   CO2 26 11/10/2015    2.) Heart palpitations - Continues to experience the associated symptoms of heart palpitations which generally occur on a nightly basis when she lies down at night. Modifying factors include alprazolam and nitro which help to relieve her symptoms. She is followed by cardiology, Dr. Terrence Dupont.    3.) Hypertension - Currently maintained on losartan and metoprolol and reports taking the medications as prescribed and denies adverse side effects or hypotensive readings. Blood pressures at home . Denies changes in vision, worst headache of life or new symptoms of end organ damage. Working on following a low sodium diet.   BP Readings from Last 3 Encounters:  06/02/16 126/72  01/15/16 154/94  01/03/16 153/89       Allergies  Allergen Reactions  . Penicillins Other (See Comments)    GI upset      Outpatient Medications Prior to Visit  Medication Sig  Dispense Refill  . albuterol (PROVENTIL HFA;VENTOLIN HFA) 108 (90 BASE) MCG/ACT inhaler Inhale 2 puffs into the lungs every 6 (six) hours as needed. For wheezing.    Marland Kitchen albuterol (PROVENTIL) (2.5 MG/3ML) 0.083% nebulizer solution Take 3 mLs (2.5 mg total) by nebulization every 6 (six) hours as needed for wheezing or shortness of breath. 75 mL 12  . ALPRAZolam (XANAX) 1 MG tablet Take 1 mg by mouth 2 (two) times daily as needed. For anxiety.    Marland Kitchen aspirin 81 MG tablet Take 81 mg by mouth daily.    . B Complex Vitamins (B COMPLEX PO) Take 1 capsule by mouth daily.    . famotidine (PEPCID) 20 MG tablet Take 20 mg by mouth 2 (two) times daily.  3  . fluticasone (FLONASE) 50 MCG/ACT nasal spray Place 2 sprays into both nostrils 2 (two) times daily. 1 g 2  . ibuprofen (ADVIL,MOTRIN) 400 MG tablet Take 1 tablet (400 mg total) by mouth every 6 (six) hours as needed. 30 tablet 0  . metoprolol tartrate (LOPRESSOR) 25 MG tablet Take 25 mg by mouth 2 (two) times daily.     . nitroGLYCERIN (NITROSTAT) 0.4 MG SL tablet Place 0.4 mg under the tongue every 5 (five) minutes as needed. For chest pain.    Marland Kitchen Spacer/Aero-Holding Chambers (AEROCHAMBER PLUS WITH MASK) inhaler Use as instructed 1 each 2  . albuterol (PROVENTIL HFA;VENTOLIN HFA) 108 (90 Base) MCG/ACT inhaler Inhale 1-2 puffs into  the lungs every 6 (six) hours as needed for wheezing or shortness of breath. 1 Inhaler 0  . chlorpheniramine-HYDROcodone (TUSSIONEX PENNKINETIC ER) 10-8 MG/5ML SUER 5 ml PO every 12 hours as needed for pain/or cough 100 mL 0  . HYDROcodone-homatropine (HYCODAN) 5-1.5 MG/5ML syrup Take 5 mLs by mouth every 4 (four) hours as needed for cough. 120 mL 0  . lisinopril-hydrochlorothiazide (PRINZIDE,ZESTORETIC) 20-12.5 MG per tablet Take 1 tablet by mouth daily.    Marland Kitchen lisinopril-hydrochlorothiazide (ZESTORETIC) 20-12.5 MG tablet Take 1 tablet by mouth daily. 30 tablet 0  . metFORMIN (GLUCOPHAGE) 500 MG tablet Take 500 mg by mouth 2 (two)  times daily with a meal.    . metoprolol (LOPRESSOR) 50 MG tablet Take 1 tablet (50 mg total) by mouth 2 (two) times daily. 30 tablet 0  . omeprazole (PRILOSEC) 40 MG capsule 1 BID before meals 60 capsule 0  . predniSONE (DELTASONE) 20 MG tablet Take 2 tablets once daily for 3 days, then take 1 tablet once daily for 3 days, then STOP. 9 tablet 0  . azithromycin (ZITHROMAX) 250 MG tablet Take 1 tablet (250 mg total) by mouth daily. Take first 2 tablets together, then 1 every day until finished. 6 tablet 0  . benzonatate (TESSALON) 100 MG capsule Take 1 capsule (100 mg total) by mouth 3 (three) times daily. 20 capsule 0  . levofloxacin (LEVAQUIN) 750 MG tablet Take 1 tablet (750 mg total) by mouth daily. For 2 more days starting 03/23/15 2 tablet 0  . oseltamivir (TAMIFLU) 75 MG capsule Take 1 capsule (75 mg total) by mouth 2 (two) times daily. For 3 more days 6 capsule 0   No facility-administered medications prior to visit.      Past Medical History:  Diagnosis Date  . Asthma   . Coronary artery disease   . Diabetes mellitus   . Heart palpitations   . Hypertension       Past Surgical History:  Procedure Laterality Date  . CESAREAN SECTION    . CORONARY ARTERY BYPASS GRAFT    . ESOPHAGEAL MANOMETRY N/A 03/06/2012   Procedure: ESOPHAGEAL MANOMETRY (EM);  Surgeon: Beryle Beams, MD;  Location: [facility];  Service: Endoscopy;  Laterality: N/A;  . TONSILLECTOMY        Family History  Problem Relation Age of Onset  . Asthma Mother   . Alcohol abuse Father       Social History   Social History  . Marital status: Legally Separated    Spouse name: N/A  . Number of children: 4  . Years of education: 15   Occupational History  . Retired    Social History Main Topics  . Smoking status: Never Smoker  . Smokeless tobacco: Never Used  . Alcohol use No  . Drug use: No  . Sexual activity: Yes   Other Topics Concern  . Not on file   Social History Narrative    Fun/Hobby: Gardening    Denies abuse and feels safe at home.       Review of Systems  Constitutional: Negative for chills, fatigue and fever.  Eyes:       Negative for changes in vision  Respiratory: Negative for cough, chest tightness, shortness of breath and wheezing.   Cardiovascular: Positive for palpitations. Negative for chest pain and leg swelling.  Endocrine: Negative for polydipsia, polyphagia and polyuria.  Neurological: Negative for dizziness, weakness, light-headedness and numbness.       Objective:    BP  126/72 (BP Location: Left Arm, Patient Position: Sitting, Cuff Size: Large)   Pulse 75   Temp 98.5 F (36.9 C) (Oral)   Resp 16   Ht _0  (1.6 m)   Wt 158 lb 12.8 oz (72 kg)   SpO2 97%   BMI 28.13 kg/m  Nursing note and vital signs reviewed.  Physical Exam  Constitutional: She is oriented to person, place, and time. She appears well-developed and well-nourished. No distress.  Cardiovascular: Normal rate, regular rhythm, normal heart sounds and intact distal pulses.  Exam reveals no gallop and no friction rub.   No murmur heard. Pulmonary/Chest: Effort normal and breath sounds normal. No respiratory distress. She has no wheezes. She has no rales. She exhibits no tenderness.  Neurological: She is alert and oriented to person, place, and time.  Skin: Skin is warm and dry.  Psychiatric: She has a normal mood and affect. Her behavior is normal. Judgment and thought content normal.        Assessment & Plan:   Problem List Items Addressed This Visit      Cardiovascular and Mediastinum   HTN (hypertension)    Blood pressure appears adequately controlled current medication regimen and no adverse side effects. Denies worst headache of life with no symptoms of end organ damage noted on physical exam. Continue current dosage of losartan and metoprolol. Encouraged to monitor blood pressure at home and follow low-sodium diet.      Relevant Medications   losartan  (COZAAR) 25 MG tablet     Endocrine   Diabetes mellitus type 2, controlled ([place]) - Primary    Obtain hemoglobin A1c, complete metabolic profile, and urine microalbumin. Discontinue metformin. Start metformin extended release daily. Encouraged to monitor blood pressures at home and follow low/carbohydrate modify diet. Maintained on losartan for CAD risk reduction. Diabetic foot exam completed today. Declines Pneumovax. Follow-up in 3 months or sooner pending A1c results.      Relevant Medications   losartan (COZAAR) 25 MG tablet   metFORMIN (GLUCOPHAGE XR) 500 MG 24 hr tablet   Other Relevant Orders   Hemoglobin A1c   Comprehensive metabolic panel   Urine Microalbumin w/creat. ratio     Other   Heart palpitations    Increasing heart palpitations with symptoms concerning for cardiac versus anxiety related origin. In office EKG shows first-degree AV block which is consistent with previous EKG, with new EKG showing increased level of first-degree block. Recommend follow-up with cardiology for change in EKG. Continue current dosage of alprazolam for anxiety and sleep as needed. Consider Holter monitor. Continue to monitor.      Relevant Orders   EKG 12-Lead (Completed)       I have discontinued Ms. Finck metFORMIN, lisinopril-hydrochlorothiazide, omeprazole, HYDROcodone-homatropine, levofloxacin, oseltamivir, predniSONE, benzonatate, lisinopril-hydrochlorothiazide, azithromycin, and chlorpheniramine-HYDROcodone. I am also having her start on metFORMIN. Additionally, I am having her maintain her albuterol, B Complex Vitamins (B COMPLEX PO), ALPRAZolam, nitroGLYCERIN, metoprolol tartrate, famotidine, aspirin, ibuprofen, fluticasone, albuterol, aerochamber plus with mask, and losartan.   Meds ordered this encounter  Medications  . losartan (COZAAR) 25 MG tablet    Sig: Take 25 mg by mouth 2 (two) times daily.  . metFORMIN (GLUCOPHAGE XR) 500 MG 24 hr tablet    Sig: Take 1 tablet (500  mg total) by mouth daily with breakfast.    Dispense:  30 tablet    Refill:  2    Order Specific Question:   Supervising Provider    Answer:   CRAWFORD,  ELIZABETH A [[id number]]     Follow-up: Return in about 3 months (around 09/02/2016), or if symptoms worsen or fail to improve.  Mauricio Po, FNP

## 2016-06-02 NOTE — Assessment & Plan Note (Signed)
Blood pressure appears adequately controlled current medication regimen and no adverse side effects. Denies worst headache of life with no symptoms of end organ damage noted on physical exam. Continue current dosage of losartan and metoprolol. Encouraged to monitor blood pressure at home and follow low-sodium diet.

## 2016-06-02 NOTE — Patient Instructions (Addendum)
Thank you for choosing [facility].  SUMMARY AND INSTRUCTIONS:  Please continue to take your medications as prescribed.  Please follow up with Dr. Terrence Dupont for the heart palpitations.   We will change your metformin to extended release to see if that helps with your symptoms. Please START taking the new METFORMIN daily.   We will follow up pending your blood work results.   Medication:  Your prescription(s) have been submitted to your pharmacy or been printed and provided for you. Please take as directed and contact our office if you believe you are having problem(s) with the medication(s) or have any questions.  Labs:  Please stop by the lab on the lower level of the building for your blood work. Your results will be released to [place] (or called to you) after review, usually within 72 hours after test completion. If any changes need to be made, you will be notified at that same time.  1.) The lab is open from 7:30am to 5:30 pm Monday-Friday 2.) No appointment is necessary 3.) Fasting (if needed) is 6-8 hours after food and drink; black coffee and water are okay   Follow up:  If your symptoms worsen or fail to improve, please contact our office for further instruction, or in case of emergency go directly to the emergency room at the closest medical facility.

## 2016-06-02 NOTE — Assessment & Plan Note (Signed)
Obtain hemoglobin A1c, complete metabolic profile, and urine microalbumin. Discontinue metformin. Start metformin extended release daily. Encouraged to monitor blood pressures at home and follow low/carbohydrate modify diet. Maintained on losartan for CAD risk reduction. Diabetic foot exam completed today. Declines Pneumovax. Follow-up in 3 months or sooner pending A1c results.

## 2016-06-02 NOTE — Assessment & Plan Note (Signed)
Increasing heart palpitations with symptoms concerning for cardiac versus anxiety related origin. In office EKG shows first-degree AV block which is consistent with previous EKG, with new EKG showing increased level of first-degree block. Recommend follow-up with cardiology for change in EKG. Continue current dosage of alprazolam for anxiety and sleep as needed. Consider Holter monitor. Continue to monitor.

## 2016-06-08 ENCOUNTER — Emergency Department (HOSPITAL_COMMUNITY): Payer: Medicare Other

## 2016-06-08 ENCOUNTER — Emergency Department (HOSPITAL_COMMUNITY)
Admission: EM | Admit: 2016-06-08 | Discharge: 2016-06-08 | Disposition: A | Discharge status: Home / Self Care | Payer: Medicare Other | Attending: Emergency Medicine | Admitting: Emergency Medicine

## 2016-06-08 ENCOUNTER — Encounter (HOSPITAL_COMMUNITY): Payer: Self-pay | Admitting: Emergency Medicine

## 2016-06-08 DIAGNOSIS — Z951 Presence of aortocoronary bypass graft: Secondary | ICD-10-CM | POA: Diagnosis not present

## 2016-06-08 DIAGNOSIS — E119 Type 2 diabetes mellitus without complications: Secondary | ICD-10-CM | POA: Diagnosis not present

## 2016-06-08 DIAGNOSIS — I2510 Atherosclerotic heart disease of native coronary artery without angina pectoris: Secondary | ICD-10-CM | POA: Diagnosis not present

## 2016-06-08 DIAGNOSIS — M25561 Pain in right knee: Secondary | ICD-10-CM | POA: Insufficient documentation

## 2016-06-08 DIAGNOSIS — J45909 Unspecified asthma, uncomplicated: Secondary | ICD-10-CM | POA: Diagnosis not present

## 2016-06-08 DIAGNOSIS — G8929 Other chronic pain: Secondary | ICD-10-CM | POA: Insufficient documentation

## 2016-06-08 DIAGNOSIS — I10 Essential (primary) hypertension: Secondary | ICD-10-CM | POA: Diagnosis not present

## 2016-06-08 MED ORDER — TRAMADOL HCL 50 MG PO TABS: 50.0000 mg | ORAL_TABLET | Freq: Four times a day (QID) | ORAL | 0 refills | Status: DC | PRN

## 2016-06-08 MED ORDER — HYDROCODONE-ACETAMINOPHEN 5-325 MG PO TABS
1.0000 | ORAL_TABLET | Freq: Once | ORAL | Status: AC
  Administered 2016-06-08: 1 via ORAL
  Filled 2016-06-08: qty 1

## 2016-06-08 NOTE — ED Notes (Signed)
Pt reports past x-rays and states she does not need those she needs an MRI on her knee.

## 2016-06-08 NOTE — Discharge Instructions (Signed)
We believe that your symptoms are caused by musculoskeletal strain.  Please read through the included information about additional care such as heating pads, over-the-counter pain medicine.  If you were provided a prescription please use it only as needed and as instructed.  Remember that early mobility and using the affected part of your body is actually better than keeping it immobile.  Follow-up with the doctor listed as recommended or return to the emergency department with new or worsening symptoms that concern you.

## 2016-06-08 NOTE — ED Triage Notes (Signed)
Pt comes in with complaints of right knee pain since yesterday. Reports she has had chronic issues with that knee for the past year and has been taking gabapentin.  Originally was getting injections but it began to form hard tissue so they were discontinued. Pt reports seeing a physician at the podiatry clinic about 7 months ago but the MD moved to a different practice that does not take her insurance.

## 2016-06-08 NOTE — ED Provider Notes (Signed)
Emergency Department Provider Note   I have reviewed the triage vital signs and the nursing notes.   HISTORY  Chief Complaint No chief complaint on file.   HPI Sharon Spears is a 71 y.o. female with PMH of DM, ACS, and HTN presents to the ED for evaluation of acute on chronic right knee pain. Patient has had right knee pain for at least the last year. She has been followed by podiatry and orthopedics. She is having worsening pain with new injury. No fever, chills, redness, or warmth. No falls. Patient trying NSAIDs at home with no relief. Is no longer seeing specialist providers because of provider retirement. She is requesting ED MRI of the right knee. Pain worse with ambulation. No alleviating factors. No radiation.    Past Medical History:  Diagnosis Date  . Asthma   . Coronary artery disease   . Diabetes mellitus   . Heart palpitations   . Hypertension     Patient Active Problem List   Diagnosis Date Noted  . Heart palpitations 06/02/2016  . Acute on chronic respiratory failure with hypoxia ([place]) 03/20/2015  . Asthma exacerbation 03/20/2015  . Laryngitis, acute 03/20/2015  . Thrombocytopenia ([place]) 03/20/2015  . Diabetes mellitus type 2, controlled ([place]) 03/20/2015  . HTN (hypertension) 03/20/2015  . S/P CABG (coronary artery bypass graft) 03/20/2015    Past Surgical History:  Procedure Laterality Date  . CESAREAN SECTION    . CORONARY ARTERY BYPASS GRAFT    . ESOPHAGEAL MANOMETRY N/A 03/06/2012   Procedure: ESOPHAGEAL MANOMETRY (EM);  Surgeon: Beryle Beams, MD;  Location: [facility];  Service: Endoscopy;  Laterality: N/A;  . TONSILLECTOMY      Current Outpatient Rx  . Order #: [id number] Class: Historical Med  . Order #: [id number] Class: Print  . Order #: [id number] Class: Historical Med  . Order #: [id number] Class: Historical Med  . Order #: [id number] Class: Historical Med  . Order #: [id number] Class: Historical Med  . Order #: [id number] Class: Print  . Order #:  [id number] Class: Historical Med  . Order #: [id number] Class: Normal  . Order #: [id number] Class: Historical Med  . Order #: [id number] Class: Historical Med  . Order #: [id number] Class: Print  . Order #: [id number] Class: Print  . Order #: [id number] Class: Print    Allergies Penicillins  Family History  Problem Relation Age of Onset  . Asthma Mother   . Alcohol abuse Father     Social History Social History  Substance Use Topics  . Smoking status: Never Smoker  . Smokeless tobacco: Never Used  . Alcohol use No    Review of Systems  Constitutional: No fever/chills Eyes: No visual changes. ENT: No sore throat. Cardiovascular: Denies chest pain. Respiratory: Denies shortness of breath. Gastrointestinal: No abdominal pain.  No nausea, no vomiting.  No diarrhea.  No constipation. Genitourinary: Negative for dysuria. Musculoskeletal: Negative for back pain. Positive right knee pain.  Skin: Negative for rash. Neurological: Negative for headaches, focal weakness or numbness.  10-point ROS otherwise negative.  ____________________________________________   PHYSICAL EXAM:  VITAL SIGNS: ED Triage Vitals  Enc Vitals Group     BP 06/08/16 1352 (!) 154/104     Pulse Rate 06/08/16 1352 (!) 103     Resp 06/08/16 1352 16     Temp 06/08/16 1352 97.8 F (36.6 C)     Temp Source 06/08/16 1352 Oral     SpO2 06/08/16 1352 95 %     Pain Score 06/08/16 1400 10    Constitutional: Alert and oriented.  Well appearing and in no acute distress. Eyes: Conjunctivae are normal. Head: Atraumatic. Nose: No congestion/rhinnorhea. Mouth/Throat: Mucous membranes are moist.  Neck: No stridor.   Cardiovascular: Normal rate, regular rhythm. Good peripheral circulation. Grossly normal heart sounds.   Respiratory: Normal respiratory effort.  No retractions. Lungs CTAB. Musculoskeletal: No lower extremity edema. No gross deformities of extremities. Full ROM of bilateral knees and hips. No erythema,  swelling, or warmth.  Neurologic:  Normal speech and language. No gross focal neurologic deficits are appreciated.  Skin:  Skin is warm, dry and intact. No rash noted.  ____________________________________________  RADIOLOGY  Dg Knee 2 Views Right  Result Date: 06/08/2016 CLINICAL DATA:  Pain EXAM: RIGHT KNEE - 1-2 VIEW COMPARISON:  June 23, 2015 FINDINGS: Frontal and lateral views were obtained. There is no fracture or dislocation. There is slight narrowing medially. Other joint spaces appear normal. No erosive change. IMPRESSION: Slight narrowing medially.  No fracture or joint effusion. Electronically Signed   By: Lowella Grip III M.D.   On: 06/08/2016 15:13    ____________________________________________   PROCEDURES  Procedure(s) performed:   Procedures  None ____________________________________________   INITIAL IMPRESSION / ASSESSMENT AND PLAN / ED COURSE  Pertinent labs & imaging results that were available during my care of the patient were reviewed by me and considered in my medical decision making (see chart for details).  Patient presents to the ED for evaluation of right knee pain for the last year. She is requesting MRI and orthopedic referral. Discussed no indication for emergent MRI but this may be required as an outpatient. Last plain film of the knee was last year. Repeat films negative for fracture, dislocation, or worsening chronic disease. Provided pain medication for home use and referral to both podiatry and orthopedics.   At this time, I do not feel there is any life-threatening condition present. I have reviewed and discussed all results (EKG, imaging, lab, urine as appropriate), exam findings with patient. I have reviewed nursing notes and appropriate previous records.  I feel the patient is safe to be discharged home without further emergent workup. Discussed usual and customary return precautions. Patient and family (if present) verbalize  understanding and are comfortable with this plan.  Patient will follow-up with their primary care provider. If they do not have a primary care provider, information for follow-up has been provided to them. All questions have been answered.  ____________________________________________  FINAL CLINICAL IMPRESSION(S) / ED DIAGNOSES  Final diagnoses:  Chronic pain of right knee     MEDICATIONS GIVEN DURING THIS VISIT:  Medications  HYDROcodone-acetaminophen (NORCO/VICODIN) 5-325 MG per tablet 1 tablet (1 tablet Oral Given 06/08/16 1451)     NEW OUTPATIENT MEDICATIONS STARTED DURING THIS VISIT:  Discharge Medication List as of 06/08/2016  3:44 PM    START taking these medications   Details  traMADol (ULTRAM) 50 MG tablet Take 1 tablet (50 mg total) by mouth every 6 (six) hours as needed., Starting Tue 06/08/2016, Print         Note:  This document was prepared using Dragon voice recognition software and may include unintentional dictation errors.  Nanda Quinton, MD Emergency Medicine   Devanee Pomplun, [facility], MD 06/08/16 [phone]

## 2016-06-14 ENCOUNTER — Ambulatory Visit (INDEPENDENT_AMBULATORY_CARE_PROVIDER_SITE_OTHER): Payer: Self-pay | Admitting: Physician Assistant

## 2016-07-16 ENCOUNTER — Ambulatory Visit (INDEPENDENT_AMBULATORY_CARE_PROVIDER_SITE_OTHER): Payer: Self-pay | Admitting: Orthopaedic Surgery

## 2016-08-17 ENCOUNTER — Other Ambulatory Visit: Payer: Self-pay | Admitting: Cardiology

## 2016-08-17 DIAGNOSIS — R079 Chest pain, unspecified: Secondary | ICD-10-CM

## 2016-08-23 ENCOUNTER — Encounter (HOSPITAL_COMMUNITY): Admission: RE | Admit: 2016-08-23 | Payer: Medicaid Other | Source: Ambulatory Visit

## 2016-10-27 ENCOUNTER — Ambulatory Visit: Payer: Medicare Other | Attending: Orthopedic Surgery | Admitting: Physical Therapy

## 2016-10-27 ENCOUNTER — Encounter: Payer: Self-pay | Admitting: Physical Therapy

## 2016-10-27 DIAGNOSIS — M6281 Muscle weakness (generalized): Secondary | ICD-10-CM

## 2016-10-27 DIAGNOSIS — M25661 Stiffness of right knee, not elsewhere classified: Secondary | ICD-10-CM

## 2016-10-27 NOTE — Therapy (Signed)
[facility] [place], [place], [address] Phone: [phone]   Fax:  [phone]  Physical Therapy Evaluation  Patient Details  Name: Sharon Spears MRN: [id number] Date of Birth: 07-27-1945 Referring Provider: Marchia Bond, MD  Encounter Date: 10/27/2016      PT End of Session - 10/27/16 1517    Visit Number 1   Number of Visits 13   Date for PT Re-Evaluation 12/10/16   Authorization Type MCR- KX at visit 15   PT Start Time 1500   PT Stop Time 1544   PT Time Calculation (min) 44 min   Activity Tolerance Patient tolerated treatment well   Behavior During Therapy [facility] for tasks assessed/performed      Past Medical History:  Diagnosis Date  . Asthma   . Coronary artery disease   . Diabetes mellitus   . Heart palpitations   . Hypertension     Past Surgical History:  Procedure Laterality Date  . CESAREAN SECTION    . CORONARY ARTERY BYPASS GRAFT    . ESOPHAGEAL MANOMETRY N/A 03/06/2012   Procedure: ESOPHAGEAL MANOMETRY (EM);  Surgeon: Beryle Beams, MD;  Location: [facility];  Service: Endoscopy;  Laterality: N/A;  . TONSILLECTOMY      There were no vitals filed for this visit.       Subjective Assessment - 10/27/16 1505    Subjective Pt reports she does not have pain. Unable to bend her knee, feels like it is metal. Has to be careful because she loses her balance. Reports she is unable to wear shoes due to diffiulty fitting over air cast and foot gets sweaty. Knee arthroscopy 07/16/16, was having a lot of pain and could hardly walk; now has full resolution of pain.    How long can you walk comfortably? can walk from parking spot into [employer] & into apartment, uses scooter in [employer]   Patient Stated Goals be able to bend when dropping things, improve walking ability   Currently in Pain? No/denies            [facility] PT Assessment - 10/27/16 0001      Assessment   Medical Diagnosis R knee  weakness, s/p arthroscopy   Referring Provider Marchia Bond, MD   Onset Date/Surgical Date 07/16/16   Hand Dominance Right   Next MD Visit 11/18   Prior Therapy none     Precautions   Precautions None     Restrictions   Weight Bearing Restrictions No     Balance Screen   Has the patient fallen in the past 6 months No     [place] [facility]  [facility]ior facility   Additional Comments elevators available     Prior Function   Leisure volunteers at [employer]      Cognition   Overall Cognitive Status Within Functional Limits for tasks assessed     Observation/Other Assessments   Focus on Therapeutic Outcomes (FOTO)  70% limited (goal 55%)     Sensation   Additional Comments numbness bilat feet-diabetic     ROM / Strength   AROM / PROM / Strength PROM;Strength     PROM   PROM Assessment Site Knee   Right/Left Knee Right   Right Knee Extension 0   Right Knee Flexion 122     Strength   Strength Assessment Site Knee;Hip   Right/Left Hip Right   Right Hip Flexion 3+/5   Right Hip Extension  4-/5   Right Hip ABduction 4-/5   Right/Left Knee Right   Right Knee Flexion 4-/5   Right Knee Extension 4+/5     Palpation   Patella mobility limited superior/inferior mobility            Objective measurements completed on examination: See above findings.          [facility] Adult PT Treatment/Exercise - 10/27/16 0001      Exercises   Exercises Knee/Hip     Knee/Hip Exercises: Supine   Straight Leg Raises 10 reps;Right     Knee/Hip Exercises: Prone   Hamstring Curl 10 reps                PT Education - 10/27/16 1714    Education provided Yes   Education Details anatomy of condition, POC, HEP, exercise form/rationale   Person(s) Educated Patient   Methods Explanation;Demonstration;Tactile cues;Verbal cues;Handout   Comprehension Verbalized understanding;Returned demonstration;Verbal cues required;Need further instruction;Tactile  cues required             PT Long Term Goals - 10/27/16 1719      PT LONG TERM GOAL #1   Title FOTO to 55% limitation to indicate significant improvement in functional ability   Baseline 70% limitation at eval   Time 6   Period Weeks   Status New   Target Date 12/10/16     PT LONG TERM GOAL #2   Title Pt will demo gross 5/5 LE MMT for proper support to LE biomechanical chain   Baseline see flowsheet   Time 6   Period Weeks   Status New   Target Date 12/10/16     PT LONG TERM GOAL #3   Title Pt will be able to bend to grab objects from the floor without feeling limited by ability to bend knee   Baseline "feels like there is steel in there stopping it" at eval   Time 6   Period Weeks   Status New   Target Date 12/10/16     PT LONG TERM GOAL #4   Title Pt will be able to navigate steps and stairs in the community safely and confidently   Baseline very nervous of falling and avoids steps   Time 6   Period Weeks   Status New   Target Date 12/10/16                Plan - 10/27/16 1715    Clinical Impression Statement Pt presents to PT with complaints of limited functional use of R knee s/p arthroscopy in July. Pt reports surgery completely resolved her knee pain but now feels as though she cannot bend it. Does water aerobics 3x/week and was encouraged to continue. Pt demo significant limitations in strengthe and limited endurance in community ambulation. ROM is good but patellar mobility is limited in superior/inferior motions. Pt will benefit from skilled PT in order to improve gross LE strength and function to reach long term goals.    History and Personal Factors relevant to plan of care: DM, CAD, HTN, asthma   Clinical Presentation Stable   Clinical Presentation due to: n/a   Clinical Decision Making Low   Rehab Potential Good   PT Frequency 2x / week   PT Duration 6 weeks   PT Treatment/Interventions ADLs/Self Care Home Management;Cryotherapy;Electrical  Stimulation;Functional mobility training;Stair training;Gait training;Moist Heat;Therapeutic activities;Therapeutic exercise;Balance training;Neuromuscular re-education;Patient/family education;Passive range of motion;Scar mobilization;Manual techniques;Dry needling;Taping   PT Next Visit Plan bike, patellar mobs, hamstring &  glut strengthening, sit<>stand   PT Home Exercise Plan SLR, prone hamstring curls;    Consulted and Agree with Plan of Care Patient      Patient will benefit from skilled therapeutic intervention in order to improve the following deficits and impairments:  Difficulty walking, Increased muscle spasms, Decreased endurance, Decreased activity tolerance, Pain, Improper body mechanics, Impaired flexibility, Hypomobility, Decreased balance, Decreased strength, Increased edema, Impaired sensation, Postural dysfunction  Visit Diagnosis: Muscle weakness (generalized) - Plan: PT plan of care cert/re-cert  Stiffness of right knee, not elsewhere classified - Plan: PT plan of care cert/re-cert      G-Codes - 70/01/74 1726    Functional Assessment Tool Used (Outpatient Only) FOTO 70% limited, clinical judgement   Functional Limitation Mobility: Walking and moving around   Mobility: Walking and Moving Around Current Status (B4496) At least 60 percent but less than 80 percent impaired, limited or restricted   Mobility: Walking and Moving Around Goal Status [phone]) At least 40 percent but less than 60 percent impaired, limited or restricted       Problem List Patient Active Problem List   Diagnosis Date Noted  . Heart palpitations 06/02/2016  . Acute on chronic respiratory failure with hypoxia ([place]) 03/20/2015  . Asthma exacerbation 03/20/2015  . Laryngitis, acute 03/20/2015  . Thrombocytopenia ([place]) 03/20/2015  . Diabetes mellitus type 2, controlled ([place]) 03/20/2015  . HTN (hypertension) 03/20/2015  . S/P CABG (coronary artery bypass graft) 03/20/2015    Revin Corker C.  Analisia Kingsford PT, DPT 10/27/16 5:28 PM   [facility] [address] [place], [place], [address] Phone: [phone]   Fax:  [phone]  Name: Sharon Spears MRN: [id number] Date of Birth: 29-Mar-1945

## 2016-11-02 ENCOUNTER — Encounter: Payer: Self-pay | Admitting: Physical Therapy

## 2016-11-02 ENCOUNTER — Ambulatory Visit: Payer: Medicare Other | Admitting: Physical Therapy

## 2016-11-02 DIAGNOSIS — M25661 Stiffness of right knee, not elsewhere classified: Secondary | ICD-10-CM

## 2016-11-02 DIAGNOSIS — M6281 Muscle weakness (generalized): Secondary | ICD-10-CM

## 2016-11-02 NOTE — Therapy (Signed)
[place] [place], [place], [address] Phone: [phone]   Fax:  [phone]  Physical Therapy Treatment  Patient Details  Name: Sharon Spears MRN: [id number] Date of Birth: September 05, 1945 Referring Provider: Marchia Bond, MD  Encounter Date: 11/02/2016      PT End of Session - 11/02/16 0927    Visit Number 2   Number of Visits 13   Date for PT Re-Evaluation 12/10/16   PT Start Time 0846   PT Stop Time 0930   PT Time Calculation (min) 44 min   Activity Tolerance Patient tolerated treatment well   Behavior During Therapy [facility] for tasks assessed/performed      Past Medical History:  Diagnosis Date  . Asthma   . Coronary artery disease   . Diabetes mellitus   . Heart palpitations   . Hypertension     Past Surgical History:  Procedure Laterality Date  . CESAREAN SECTION    . CORONARY ARTERY BYPASS GRAFT    . ESOPHAGEAL MANOMETRY N/A 03/06/2012   Procedure: ESOPHAGEAL MANOMETRY (EM);  Surgeon: Beryle Beams, MD;  Location: [facility];  Service: Endoscopy;  Laterality: N/A;  . TONSILLECTOMY      There were no vitals filed for this visit.      Subjective Assessment - 11/02/16 0852    Subjective I did my exercises this morning.  i worked with the trainer yesterday.   Currently in Pain? Yes   Pain Score 5    Pain Location Knee   Pain Orientation Right   Pain Descriptors / Indicators Aching   Pain Type Chronic pain   Aggravating Factors  using heavy weight   Pain Relieving Factors ice   Multiple Pain Sites --  back pain last night.  5/10                         [facility] Adult PT Treatment/Exercise - 11/02/16 0001      Knee/Hip Exercises: Stretches   Passive Hamstring Stretch 3 reps;30 seconds   Quad Stretch 5 reps  30 seonds,  off edge of bed.  with manual strumming of quads   Hip Flexor Stretch Limitations non tight left     Knee/Hip Exercises: Supine   Quad Sets 10 reps   Quad Sets Limitations heavy cues   Bridges Limitations 10   Straight Leg Raises 1 set;10 reps   Straight Leg Raises Limitations cued to [facility] back engaged.  cued for ankle pumps to increase DF  right   Patellar Mobs yes,  creeping patellar glides     Moist Heat Therapy   Moist Heat Location Knee  concurrent with manual and stretching                PT Education - 11/02/16 0927    Education provided Yes   Education Details HEP   Person(s) Educated Patient   Methods Explanation;Demonstration;Tactile cues;Verbal cues;Handout   Comprehension Verbalized understanding;Returned demonstration             PT Long Term Goals - 10/27/16 1719      PT LONG TERM GOAL #1   Title FOTO to 55% limitation to indicate significant improvement in functional ability   Baseline 70% limitation at eval   Time 6   Period Weeks   Status New   Target Date 12/10/16     PT LONG TERM GOAL #2   Title Pt will demo gross 5/5 LE MMT for proper  support to LE biomechanical chain   Baseline see flowsheet   Time 6   Period Weeks   Status New   Target Date 12/10/16     PT LONG TERM GOAL #3   Title Pt will be able to bend to grab objects from the floor without feeling limited by ability to bend knee   Baseline "feels like there is steel in there stopping it" at eval   Time 6   Period Weeks   Status New   Target Date 12/10/16     PT LONG TERM GOAL #4   Title Pt will be able to navigate steps and stairs in the community safely and confidently   Baseline very nervous of falling and avoids steps   Time 6   Period Weeks   Status New   Target Date 12/10/16               Plan - 11/02/16 5189    Clinical Impression Statement Minor cues needed for HEP.  able to progress HEP.  Patellar mobility improved with manual   PT Treatment/Interventions ADLs/Self Care Home Management;Cryotherapy;Electrical Stimulation;Functional mobility training;Stair training;Gait training;Moist Heat;Therapeutic  activities;Therapeutic exercise;Balance training;Neuromuscular re-education;Patient/family education;Passive range of motion;Scar mobilization;Manual techniques;Dry needling;Taping   PT Next Visit Plan bike, patellar mobs, hamstring & glut strengthening, sit<>stand   PT Home Exercise Plan SLR, prone hamstring curls; hip flexor stretch   Consulted and Agree with Plan of Care Patient      Patient will benefit from skilled therapeutic intervention in order to improve the following deficits and impairments:  Difficulty walking, Increased muscle spasms, Decreased endurance, Decreased activity tolerance, Pain, Improper body mechanics, Impaired flexibility, Hypomobility, Decreased balance, Decreased strength, Increased edema, Impaired sensation, Postural dysfunction  Visit Diagnosis: Muscle weakness (generalized)  Stiffness of right knee, not elsewhere classified     Problem List Patient Active Problem List   Diagnosis Date Noted  . Heart palpitations 06/02/2016  . Acute on chronic respiratory failure with hypoxia ([place]) 03/20/2015  . Asthma exacerbation 03/20/2015  . Laryngitis, acute 03/20/2015  . Thrombocytopenia ([place]) 03/20/2015  . Diabetes mellitus type 2, controlled ([place]) 03/20/2015  . HTN (hypertension) 03/20/2015  . S/P CABG (coronary artery bypass graft) 03/20/2015    HARRIS,KAREN PTA 11/02/2016, 9:30 AM  [facility] [address] [place], [place], [address] Phone: [phone]   Fax:  [phone]  Name: Sharon Spears MRN: [id number] Date of Birth: April 17, 1945

## 2016-11-09 ENCOUNTER — Ambulatory Visit: Payer: Medicare Other | Admitting: Physical Therapy

## 2016-11-09 DIAGNOSIS — M6281 Muscle weakness (generalized): Secondary | ICD-10-CM | POA: Diagnosis not present

## 2016-11-09 DIAGNOSIS — M25661 Stiffness of right knee, not elsewhere classified: Secondary | ICD-10-CM

## 2016-11-09 NOTE — Therapy (Signed)
[place], [place], [address] Phone: [phone]   Fax:  [phone]  Physical Therapy Treatment  Patient Details  Name: Sharon Spears MRN: [id number] Date of Birth: [birth year] Referring Provider: Marchia Bond, MD  Encounter Date: 11/09/2016      PT End of Session - 11/09/16 0931    Visit Number 3   Number of Visits 13   Date for PT Re-Evaluation 12/10/16   PT Start Time 8250   PT Stop Time 0930   PT Time Calculation (min) 43 min   Activity Tolerance Patient tolerated treatment well   Behavior During Therapy [facility] for tasks assessed/performed      Past Medical History:  Diagnosis Date  . Asthma   . Coronary artery disease   . Diabetes mellitus   . Heart palpitations   . Hypertension     Past Surgical History:  Procedure Laterality Date  . CESAREAN SECTION    . CORONARY ARTERY BYPASS GRAFT    . ESOPHAGEAL MANOMETRY N/A 03/06/2012   Procedure: ESOPHAGEAL MANOMETRY (EM);  Surgeon: Beryle Beams, MD;  Location: [facility];  Service: Endoscopy;  Laterality: N/A;  . TONSILLECTOMY      There were no vitals filed for this visit.      Subjective Assessment - 11/09/16 0923    Subjective I am walking around like a Zombie.  morning appointments are hard to get moving.     Currently in Pain? Yes   Pain Score 5    Pain Location Knee   Pain Orientation Right   Pain Descriptors / Indicators Aching   Pain Type Chronic pain   Aggravating Factors  cooking a lot for her daughter   Pain Relieving Factors heat ice.                         [facility] Adult PT Treatment/Exercise - 11/09/16 0001      Knee/Hip Exercises: Stretches   Passive Hamstring Stretch 3 reps;30 seconds   Gastroc Stretch 3 reps;30 seconds   Gastroc Stretch Limitations concurrent with hamstring stretch,  both   Other Knee/Hip Stretches legs on ball hip knee flexion     Knee/Hip Exercises: Aerobic   Recumbent Bike  5 minutes unable full Revolution.      Knee/Hip Exercises: Seated   Sit to Sand --  3 reps,, stopped due to back pain.  cued did not help     Knee/Hip Exercises: Supine   Quad Sets 10 reps   Quad Sets Limitations moderate cues   Short Arc Quad Sets 10 reps;3 sets   Short Arc Quad Sets Limitations 2, 4, 6 LBS   Bridges Limitations 10   Straight Leg Raises 1 set;10 reps   Straight Leg Raises Limitations cued to [facility] back engaged.  cued for ankle pumps to increase DF  right   Patellar Mobs yes,  creeping patellar glides  with heat     Moist Heat Therapy   Moist Heat Location Knee  concurrent with manual and stretching                     PT Long Term Goals - 10/27/16 1719      PT LONG TERM GOAL #1   Title FOTO to 55% limitation to indicate significant improvement in functional ability   Baseline 70% limitation at eval   Time 6   Period Weeks   Status New   Target Date  12/10/16     PT LONG TERM GOAL #2   Title Pt will demo gross 5/5 LE MMT for proper support to LE biomechanical chain   Baseline see flowsheet   Time 6   Period Weeks   Status New   Target Date 12/10/16     PT LONG TERM GOAL #3   Title Pt will be able to bend to grab objects from the floor without feeling limited by ability to bend knee   Baseline "feels like there is steel in there stopping it" at eval   Time 6   Period Weeks   Status New   Target Date 12/10/16     PT LONG TERM GOAL #4   Title Pt will be able to navigate steps and stairs in the community safely and confidently   Baseline very nervous of falling and avoids steps   Time 6   Period Weeks   Status New   Target Date 12/10/16               Plan - 11/09/16 0931    Clinical Impression Statement Early morning appointments are hard for patient,  she could hardly move.  Sit to stand post session 50 % easies at end of sessioh  AROM right knee 113,   PT Treatment/Interventions ADLs/Self Care Home  Management;Cryotherapy;Electrical Stimulation;Functional mobility training;Stair training;Gait training;Moist Heat;Therapeutic activities;Therapeutic exercise;Balance training;Neuromuscular re-education;Patient/family education;Passive range of motion;Scar mobilization;Manual techniques;Dry needling;Taping   PT Next Visit Plan bike, patellar mobs, hamstring & glut strengthening, sit<>stand   PT Home Exercise Plan SLR, prone hamstring curls; hip flexor stretch      Patient will benefit from skilled therapeutic intervention in order to improve the following deficits and impairments:  Difficulty walking, Increased muscle spasms, Decreased endurance, Decreased activity tolerance, Pain, Improper body mechanics, Impaired flexibility, Hypomobility, Decreased balance, Decreased strength, Increased edema, Impaired sensation, Postural dysfunction  Visit Diagnosis: Muscle weakness (generalized)  Stiffness of right knee, not elsewhere classified     Problem List Patient Active Problem List   Diagnosis Date Noted  . Heart palpitations 06/02/2016  . Acute on chronic respiratory failure with hypoxia ([place]) 03/20/2015  . Asthma exacerbation 03/20/2015  . Laryngitis, acute 03/20/2015  . Thrombocytopenia ([place]) 03/20/2015  . Diabetes mellitus type 2, controlled ([place]) 03/20/2015  . HTN (hypertension) 03/20/2015  . S/P CABG (coronary artery bypass graft) 03/20/2015    Araceli Coufal PTA 11/09/2016, 9:33 AM  [facility] [address] [place], [place], [address] Phone: [phone]   Fax:  [phone]  Name: Sharon Spears MRN: [id number] Date of Birth: 1945-04-06

## 2016-11-11 ENCOUNTER — Ambulatory Visit: Payer: Medicare Other | Attending: Cardiology | Admitting: Physical Therapy

## 2016-11-11 ENCOUNTER — Encounter: Payer: Self-pay | Admitting: Physical Therapy

## 2016-11-11 DIAGNOSIS — M25661 Stiffness of right knee, not elsewhere classified: Secondary | ICD-10-CM

## 2016-11-11 DIAGNOSIS — M6281 Muscle weakness (generalized): Secondary | ICD-10-CM

## 2016-11-11 NOTE — Therapy (Signed)
[place], [place], [address] Phone: [phone]   Fax:  [phone]  Physical Therapy Treatment  Patient Details  Name: Sharon Spears MRN: [id number] Date of Birth: 07/26/1945 Referring Provider: Marchia Bond, MD  Encounter Date: 11/11/2016      PT End of Session - 11/11/16 1025    Visit Number 4   Number of Visits 13   Date for PT Re-Evaluation 12/10/16   PT Start Time 0933   PT Stop Time 1016   PT Time Calculation (min) 43 min   Activity Tolerance Patient tolerated treatment well   Behavior During Therapy [facility] for tasks assessed/performed      Past Medical History:  Diagnosis Date  . Asthma   . Coronary artery disease   . Diabetes mellitus   . Heart palpitations   . Hypertension     Past Surgical History:  Procedure Laterality Date  . CESAREAN SECTION    . CORONARY ARTERY BYPASS GRAFT    . ESOPHAGEAL MANOMETRY N/A 03/06/2012   Procedure: ESOPHAGEAL MANOMETRY (EM);  Surgeon: Beryle Beams, MD;  Location: [facility];  Service: Endoscopy;  Laterality: N/A;  . TONSILLECTOMY      There were no vitals filed for this visit.      Subjective Assessment - 11/11/16 0936    Subjective I want later appointments.  I didi good after the last visit.  then I did my things and it started hurting.   Currently in Pain? Yes   Pain Score 5    Pain Orientation Right                         [facility] Adult PT Treatment/Exercise - 11/11/16 0001      Knee/Hip Exercises: Aerobic   Stationary Bike 5 minutes,  non revolution     Knee/Hip Exercises: Standing   Other Standing Knee Exercises wall slides facing wall  5 X each,  some back pain     Knee/Hip Exercises: Seated   Marching Limitations 10   Hamstring Curl 10 reps   Hamstring Limitations red,  HEP     Knee/Hip Exercises: Supine   Quad Sets 10 reps   Short Arc Quad Sets 10 reps;3 sets   Short Arc Quad Sets Limitations 2, 4 , 6   LBS  cued for technique 3 second holds,     Bridges Limitations 10   Straight Leg Raises 1 set;10 reps   Straight Leg Raises Limitations cued to [facility] back engaged.    right   Patellar Mobs yes,  creeping patellar glides  with heat     Knee/Hip Exercises: Sidelying   Hip ABduction 10 reps  left only,  patient could not sidely on left side due pain   Clams 10 X left     Moist Heat Therapy   Moist Heat Location Knee  concurrent with manual and supine exercises.                PT Education - 11/11/16 0953    Education provided Yes   Education Details HEP   Person(s) Educated Patient   Methods Explanation;Demonstration;Verbal cues;Handout   Comprehension Verbalized understanding;Returned demonstration             PT Long Term Goals - 10/27/16 1719      PT LONG TERM GOAL #1   Title FOTO to 55% limitation to indicate significant improvement in functional ability   Baseline 70% limitation  at eval   Time 6   Period Weeks   Status New   Target Date 12/10/16     PT LONG TERM GOAL #2   Title Pt will demo gross 5/5 LE MMT for proper support to LE biomechanical chain   Baseline see flowsheet   Time 6   Period Weeks   Status New   Target Date 12/10/16     PT LONG TERM GOAL #3   Title Pt will be able to bend to grab objects from the floor without feeling limited by ability to bend knee   Baseline "feels like there is steel in there stopping it" at eval   Time 6   Period Weeks   Status New   Target Date 12/10/16     PT LONG TERM GOAL #4   Title Pt will be able to navigate steps and stairs in the community safely and confidently   Baseline very nervous of falling and avoids steps   Time 6   Period Weeks   Status New   Target Date 12/10/16               Plan - 11/11/16 1025    Clinical Impression Statement Patient was able to tolerate a few more exercises today.  Able to progress her HEP.  .  No new onjective findings except speed with mobility  improved.   PT Treatment/Interventions ADLs/Self Care Home Management;Cryotherapy;Electrical Stimulation;Functional mobility training;Stair training;Gait training;Moist Heat;Therapeutic activities;Therapeutic exercise;Balance training;Neuromuscular re-education;Patient/family education;Passive range of motion;Scar mobilization;Manual techniques;Dry needling;Taping   PT Next Visit Plan bike, patellar mobs, hamstring & glut strengthening, sit<>stand,  progress ex as able   PT Home Exercise Plan SLR, prone hamstring curls; hip flexor stretch,  hamstring band   Consulted and Agree with Plan of Care Patient      Patient will benefit from skilled therapeutic intervention in order to improve the following deficits and impairments:     Visit Diagnosis: Muscle weakness (generalized)  Stiffness of right knee, not elsewhere classified     Problem List Patient Active Problem List   Diagnosis Date Noted  . Heart palpitations 06/02/2016  . Acute on chronic respiratory failure with hypoxia ([place]) 03/20/2015  . Asthma exacerbation 03/20/2015  . Laryngitis, acute 03/20/2015  . Thrombocytopenia ([place]) 03/20/2015  . Diabetes mellitus type 2, controlled ([place]) 03/20/2015  . HTN (hypertension) 03/20/2015  . S/P CABG (coronary artery bypass graft) 03/20/2015    Demontre Padin PTA 11/11/2016, 10:28 AM  [facility] [address] [place], [place], [address] Phone: [phone]   Fax:  [phone]  Name: Jaydin Jalomo MRN: [id number] Date of Birth: 1946-01-09

## 2016-11-11 NOTE — Patient Instructions (Signed)
HEP from exercise drawer TB  Knee red  HS curls 1-2 X a day 10 X

## 2016-11-16 ENCOUNTER — Encounter: Payer: Medicare Other | Admitting: Physical Therapy

## 2016-11-18 ENCOUNTER — Encounter: Payer: Medicare Other | Admitting: Physical Therapy

## 2016-11-22 ENCOUNTER — Ambulatory Visit: Payer: Medicare Other | Admitting: Physical Therapy

## 2016-11-23 ENCOUNTER — Encounter: Payer: Medicare Other | Admitting: Physical Therapy

## 2016-11-24 ENCOUNTER — Encounter: Payer: Self-pay | Admitting: Physical Therapy

## 2016-11-24 ENCOUNTER — Ambulatory Visit: Payer: Medicare Other | Admitting: Physical Therapy

## 2016-11-24 DIAGNOSIS — M6281 Muscle weakness (generalized): Secondary | ICD-10-CM

## 2016-11-24 DIAGNOSIS — M25661 Stiffness of right knee, not elsewhere classified: Secondary | ICD-10-CM

## 2016-11-24 NOTE — Therapy (Signed)
[place], [place], [address] Phone: [phone]   Fax:  [phone]  Physical Therapy Treatment  Patient Details  Name: Sharon Spears MRN: [id number] Date of Birth: Oct 22, 1945 Referring Provider: Marchia Bond, MD   Encounter Date: 11/24/2016  PT End of Session - 11/24/16 1144    Visit Number  5    Number of Visits  13    Date for PT Re-Evaluation  12/10/16    Authorization Type  MCR- KX at visit 15    PT Start Time  9675    PT Stop Time  1223    PT Time Calculation (min)  38 min    Activity Tolerance  Patient tolerated treatment well    Behavior During Therapy  [facility] for tasks assessed/performed       Past Medical History:  Diagnosis Date  . Asthma   . Coronary artery disease   . Diabetes mellitus   . Heart palpitations   . Hypertension     Past Surgical History:  Procedure Laterality Date  . CESAREAN SECTION    . CORONARY ARTERY BYPASS GRAFT    . TONSILLECTOMY      There were no vitals filed for this visit.  Subjective Assessment - 11/24/16 1147    Subjective  This weather is not helping. Sometimes by knee is better, sometimes it's not. It's not pain, it's discomfort and stiffness.     Patient Stated Goals  be able to bend when dropping things, improve walking ability    Currently in Pain?  Yes    Pain Score  5     Pain Location  Knee    Pain Orientation  Right    Pain Descriptors / Indicators  Discomfort stiffness    Aggravating Factors   cold weather    Pain Relieving Factors  rest                      [facility] Adult PT Treatment/Exercise - 11/24/16 0001      Knee/Hip Exercises: Stretches   Passive Hamstring Stretch  Both;2 reps;30 seconds    Gastroc Stretch  Both;2 reps;30 seconds    Gastroc Stretch Limitations  slant board, also done at counter      Knee/Hip Exercises: Aerobic   Nustep  6 min L5      Knee/Hip Exercises: Standing   Heel Raises  20 reps;10  reps    Other Standing Knee Exercises  mini lateral lunges      Knee/Hip Exercises: Seated   Sit to [employer]  15 reps             PT Education - 11/24/16 1239    Education provided  Yes    Education Details  exercise form/rationale, confidence, pain causing injury vs fatigue with exercise    Person(s) Educated  Patient    Methods  Explanation;Demonstration;Tactile cues;Verbal cues;Handout    Comprehension  Verbalized understanding;Need further instruction;Returned demonstration;Verbal cues required;Tactile cues required          PT Long Term Goals - 10/27/16 1719      PT LONG TERM GOAL #1   Title  FOTO to 55% limitation to indicate significant improvement in functional ability    Baseline  70% limitation at eval    Time  6    Period  Weeks    Status  New    Target Date  12/10/16      PT LONG TERM GOAL #2  Title  Pt will demo gross 5/5 LE MMT for proper support to LE biomechanical chain    Baseline  see flowsheet    Time  6    Period  Weeks    Status  New    Target Date  12/10/16      PT LONG TERM GOAL #3   Title  Pt will be able to bend to grab objects from the floor without feeling limited by ability to bend knee    Baseline  "feels like there is steel in there stopping it" at eval    Time  6    Period  Weeks    Status  New    Target Date  12/10/16      PT LONG TERM GOAL #4   Title  Pt will be able to navigate steps and stairs in the community safely and confidently    Baseline  very nervous of falling and avoids steps    Time  6    Period  Weeks    Status  New    Target Date  12/10/16            Plan - 11/24/16 1223    Clinical Impression Statement  Heavy verbal cuing required for form today, pt was hesitant to sit without hands but was able to perform with good form and control. Reports fatigue in exercises and was educated on pain that is injuring her vs muscle fatigue with exercise.     PT Treatment/Interventions  ADLs/Self Care Home  Management;Cryotherapy;Electrical Stimulation;Functional mobility training;Stair training;Gait training;Moist Heat;Therapeutic activities;Therapeutic exercise;Balance training;Neuromuscular re-education;Patient/family education;Passive range of motion;Scar mobilization;Manual techniques;Dry needling;Taping    PT Next Visit Plan  [facility] strength, balance, lifting from floor, GCODE    PT Home Exercise Plan  SLR, prone hamstring curls; hip flexor stretch,  hamstring band; heel raises, lateral lunges, sit<>stand, seated HSS, gastroc stretch    Consulted and Agree with Plan of Care  Patient       Patient will benefit from skilled therapeutic intervention in order to improve the following deficits and impairments:  Difficulty walking, Increased muscle spasms, Decreased endurance, Decreased activity tolerance, Pain, Improper body mechanics, Impaired flexibility, Hypomobility, Decreased balance, Decreased strength, Increased edema, Impaired sensation, Postural dysfunction  Visit Diagnosis: Muscle weakness (generalized)  Stiffness of right knee, not elsewhere classified     Problem List Patient Active Problem List   Diagnosis Date Noted  . Heart palpitations 06/02/2016  . Acute on chronic respiratory failure with hypoxia ([place]) 03/20/2015  . Asthma exacerbation 03/20/2015  . Laryngitis, acute 03/20/2015  . Thrombocytopenia ([place]) 03/20/2015  . Diabetes mellitus type 2, controlled ([place]) 03/20/2015  . HTN (hypertension) 03/20/2015  . S/P CABG (coronary artery bypass graft) 03/20/2015   Shaune Malacara C. Ismar Yabut PT, DPT 11/24/16 12:40 PM   [place], [place], [address] Phone: [phone]   Fax:  [phone]  Name: Sharon Spears MRN: [id number] Date of Birth: 1945/11/10

## 2016-11-25 ENCOUNTER — Encounter: Payer: Medicare Other | Admitting: Physical Therapy

## 2016-11-29 ENCOUNTER — Encounter: Payer: Self-pay | Admitting: Physical Therapy

## 2016-11-29 ENCOUNTER — Encounter: Payer: Medicare Other | Admitting: Physical Therapy

## 2016-11-29 ENCOUNTER — Ambulatory Visit: Payer: Medicare Other | Admitting: Physical Therapy

## 2016-11-29 DIAGNOSIS — M6281 Muscle weakness (generalized): Secondary | ICD-10-CM

## 2016-11-29 DIAGNOSIS — M25661 Stiffness of right knee, not elsewhere classified: Secondary | ICD-10-CM

## 2016-11-29 NOTE — Therapy (Signed)
[place], [place], [address] Phone: [phone]   Fax:  [phone]  Physical Therapy Treatment  Patient Details  Name: Sharon Spears MRN: [id number] Date of Birth: Apr 29, 1945 Referring Provider: Marchia Bond, MD   Encounter Date: 11/29/2016  PT End of Session - 11/29/16 1030    Visit Number  6    Number of Visits  13    Date for PT Re-Evaluation  12/10/16    Authorization Type  MCR- KX at visit 15    PT Start Time  1030    PT Stop Time  1113    PT Time Calculation (min)  43 min    Activity Tolerance  Patient tolerated treatment well    Behavior During Therapy  [facility] for tasks assessed/performed       Past Medical History:  Diagnosis Date  . Asthma   . Coronary artery disease   . Diabetes mellitus   . Heart palpitations   . Hypertension     Past Surgical History:  Procedure Laterality Date  . CESAREAN SECTION    . CORONARY ARTERY BYPASS GRAFT    . ESOPHAGEAL MANOMETRY (EM) N/A 03/06/2012   Performed by Beryle Beams, MD at [facility]  . TONSILLECTOMY      There were no vitals filed for this visit.  Subjective Assessment - 11/29/16 1030    Subjective  I wrapped my knee this morning because it was hurting me. practiced standing without using arms. Had to use electric scooter at grocery store. So far these exercises are helping me a lot    How long can you walk comfortably?  can walk from parking spot into [employer] & into apartment, uses scooter in [employer]    Patient Stated Goals  be able to bend when dropping things, improve walking ability    Currently in Pain?  Yes    Pain Score  4     Pain Location  Knee    Pain Orientation  Right    Pain Descriptors / Indicators  Aching         [facility] PT Assessment - 11/29/16 0001      Observation/Other Assessments   Focus on Therapeutic Outcomes (FOTO)   54% limited                  [facility] Adult PT Treatment/Exercise - 11/29/16  0001      Knee/Hip Exercises: Stretches   Passive Hamstring Stretch  Both;30 seconds      Knee/Hip Exercises: Aerobic   Nustep  L4 8 min      Knee/Hip Exercises: Standing   Knee Flexion  Both;15 reps    Forward Lunges  Both;10 reps    Side Lunges  Both;10 reps at counter    Other Standing Knee Exercises  fwd step taps    Other Standing Knee Exercises  side stepping red tband around knees      Knee/Hip Exercises: Seated   Sit to Sand  10 reps;without UE support      Knee/Hip Exercises: Sidelying   Hip ABduction  Both;20 reps             PT Education - 11/29/16 1051    Education provided  Yes    Education Details  FOTO    Person(s) Educated  Patient    Methods  Explanation    Comprehension  Verbalized understanding          PT Long Term Goals -  10/27/16 1719      PT LONG TERM GOAL #1   Title  FOTO to 55% limitation to indicate significant improvement in functional ability    Baseline  70% limitation at eval    Time  6    Period  Weeks    Status  New    Target Date  12/10/16      PT LONG TERM GOAL #2   Title  Pt will demo gross 5/5 LE MMT for proper support to LE biomechanical chain    Baseline  see flowsheet    Time  6    Period  Weeks    Status  New    Target Date  12/10/16      PT LONG TERM GOAL #3   Title  Pt will be able to bend to grab objects from the floor without feeling limited by ability to bend knee    Baseline  "feels like there is steel in there stopping it" at eval    Time  6    Period  Weeks    Status  New    Target Date  12/10/16      PT LONG TERM GOAL #4   Title  Pt will be able to navigate steps and stairs in the community safely and confidently    Baseline  very nervous of falling and avoids steps    Time  6    Period  Weeks    Status  New    Target Date  12/10/16            Plan - 11/29/16 1113    Clinical Impression Statement  Pt did not c/o increased knee pain with any exercises today and was able to ambulate out  without antalgic gait. Discussed trying new activities such as doing short trips to the grocery store without a scooter.     PT Treatment/Interventions  ADLs/Self Care Home Management;Cryotherapy;Electrical Stimulation;Functional mobility training;Stair training;Gait training;Moist Heat;Therapeutic activities;Therapeutic exercise;Balance training;Neuromuscular re-education;Patient/family education;Passive range of motion;Scar mobilization;Manual techniques;Dry needling;Taping    PT Next Visit Plan  lifting from floor    PT Home Exercise Plan  SLR, prone hamstring curls; hip flexor stretch,  hamstring band; heel raises, lateral lunges, sit<>stand, seated HSS, gastroc stretch       Patient will benefit from skilled therapeutic intervention in order to improve the following deficits and impairments:  Difficulty walking, Increased muscle spasms, Decreased endurance, Decreased activity tolerance, Pain, Improper body mechanics, Impaired flexibility, Hypomobility, Decreased balance, Decreased strength, Increased edema, Impaired sensation, Postural dysfunction  Visit Diagnosis: Muscle weakness (generalized)  Stiffness of right knee, not elsewhere classified     Problem List Patient Active Problem List   Diagnosis Date Noted  . Heart palpitations 06/02/2016  . Acute on chronic respiratory failure with hypoxia ([place]) 03/20/2015  . Asthma exacerbation 03/20/2015  . Laryngitis, acute 03/20/2015  . Thrombocytopenia ([place]) 03/20/2015  . Diabetes mellitus type 2, controlled ([place]) 03/20/2015  . HTN (hypertension) 03/20/2015  . S/P CABG (coronary artery bypass graft) 03/20/2015   [place] Petree C. Homer Pfeifer PT, DPT 11/29/16 11:20 AM   [facility] [address] [place], [place], [address] Phone: [phone]   Fax:  [phone]  Name: Melinda Gwinner MRN: [id number] Date of Birth: January 03, 1946

## 2016-12-01 ENCOUNTER — Encounter: Payer: Medicare Other | Admitting: Physical Therapy

## 2016-12-06 ENCOUNTER — Encounter: Payer: Self-pay | Admitting: Physical Therapy

## 2016-12-06 ENCOUNTER — Ambulatory Visit: Payer: Medicare Other | Admitting: Physical Therapy

## 2016-12-06 DIAGNOSIS — M25661 Stiffness of right knee, not elsewhere classified: Secondary | ICD-10-CM

## 2016-12-06 DIAGNOSIS — M6281 Muscle weakness (generalized): Secondary | ICD-10-CM

## 2016-12-06 NOTE — Therapy (Signed)
[place], [place], [address] Phone: [phone]   Fax:  [phone]  Physical Therapy Treatment/Discharge Summary  Patient Details  Name: Sharon Spears MRN: [id number] Date of Birth: [birth year] Referring Provider: Marchia Bond, MD   Encounter Date: 12/06/2016  PT End of Session - 12/06/16 1103    Visit Number  7    Number of Visits  13    Date for PT Re-Evaluation  12/10/16    Authorization Type  MCR- KX at visit 15    PT Start Time  1100    PT Stop Time  1133    PT Time Calculation (min)  33 min    Activity Tolerance  Patient tolerated treatment well    Behavior During Therapy  [facility] for tasks assessed/performed       Past Medical History:  Diagnosis Date  . Asthma   . Coronary artery disease   . Diabetes mellitus   . Heart palpitations   . Hypertension     Past Surgical History:  Procedure Laterality Date  . CESAREAN SECTION    . CORONARY ARTERY BYPASS GRAFT    . ESOPHAGEAL MANOMETRY N/A 03/06/2012   Procedure: ESOPHAGEAL MANOMETRY (EM);  Surgeon: Beryle Beams, MD;  Location: [facility];  Service: Endoscopy;  Laterality: N/A;  . TONSILLECTOMY      There were no vitals filed for this visit.  Subjective Assessment - 12/06/16 1104    Subjective  Is feeling the poor weather today.     Patient Stated Goals  be able to bend when dropping things, improve walking ability         [facility] PT Assessment - 12/06/16 0001      Observation/Other Assessments   Focus on Therapeutic Outcomes (FOTO)   50% limited      Strength   Right Hip Flexion  5/5    Right Hip Extension  5/5    Right Hip ABduction  5/5    Right Knee Flexion  5/5    Right Knee Extension  5/5                  [facility] Adult PT Treatment/Exercise - 12/06/16 0001      Knee/Hip Exercises: Stretches   Passive Hamstring Stretch  Both;30 seconds    Gastroc Stretch Limitations  in door      Knee/Hip Exercises:  Aerobic   Nustep  L5 6 min      Knee/Hip Exercises: Standing   Heel Raises  15 reps    Other Standing Knee Exercises  lateral lunges      Knee/Hip Exercises: Seated   Sit to Sand  10 reps             PT Education - 12/06/16 1137    Education provided  Yes    Education Details  FOTO, exercise form/rationale, goals discussion, importance of continued exercise    Person(s) Educated  Patient    Methods  Explanation;Demonstration;Tactile cues;Verbal cues    Comprehension  Verbalized understanding;Returned demonstration;Verbal cues required;Tactile cues required          PT Long Term Goals - 12/06/16 1112      PT LONG TERM GOAL #1   Title  FOTO to 55% limitation to indicate significant improvement in functional ability    Baseline  50% limitation    Status  Achieved      PT LONG TERM GOAL #2   Title  Pt will demo gross 5/5 LE  MMT for proper support to LE biomechanical chain    Baseline  see flowsheet    Status  Achieved      PT LONG TERM GOAL #3   Title  Pt will be able to bend to grab objects from the floor without feeling limited by ability to bend knee    Baseline  able    Status  Achieved      PT LONG TERM GOAL #4   Title  Pt will be able to navigate steps and stairs in the community safely and confidently    Baseline  able to demo good form, reports sitting on stairs to descend at daughters house    Status  Partially Met            Plan - 12/25/2016 1104    Clinical Impression Statement  Pt has been d/c to independent program at this time for which she verbalized comfort and understanding. Pt was encouraged to continue her exercise program and to contact us with any further questions.     PT Treatment/Interventions  ADLs/Self Care Home Management;Cryotherapy;Electrical Stimulation;Functional mobility training;Stair training;Gait training;Moist Heat;Therapeutic activities;Therapeutic exercise;Balance training;Neuromuscular re-education;Patient/family  education;Passive range of motion;Scar mobilization;Manual techniques;Dry needling;Taping    PT Next Visit Plan  d/c    PT Home Exercise Plan  SLR, prone hamstring curls; hip flexor stretch,  hamstring band; heel raises, lateral lunges, sit<>stand, seated HSS, gastroc stretch    Consulted and Agree with Plan of Care  Patient       Patient will benefit from skilled therapeutic intervention in order to improve the following deficits and impairments:  Difficulty walking, Increased muscle spasms, Decreased endurance, Decreased activity tolerance, Pain, Improper body mechanics, Impaired flexibility, Hypomobility, Decreased balance, Decreased strength, Increased edema, Impaired sensation, Postural dysfunction  Visit Diagnosis: Stiffness of right knee, not elsewhere classified  Muscle weakness (generalized)   G-Codes - 12/25/16 1137    Functional Assessment Tool Used (Outpatient Only)  FOTO 50% limited, clinical judgement    Functional Limitation  Mobility: Walking and moving around    Mobility: Walking and Moving Around Goal Status [phone])  At least 40 percent but less than 60 percent impaired, limited or restricted    Mobility: Walking and Moving Around Discharge Status [phone])  At least 40 percent but less than 60 percent impaired, limited or restricted       Problem List Patient Active Problem List   Diagnosis Date Noted  . Heart palpitations 06/02/2016  . Acute on chronic respiratory failure with hypoxia ([place]) 03/20/2015  . Asthma exacerbation 03/20/2015  . Laryngitis, acute 03/20/2015  . Thrombocytopenia ([place]) 03/20/2015  . Diabetes mellitus type 2, controlled ([place]) 03/20/2015  . HTN (hypertension) 03/20/2015  . S/P CABG (coronary artery bypass graft) 03/20/2015   PHYSICAL THERAPY DISCHARGE SUMMARY  Visits from Start of Care: 7  Current functional level related to goals / functional outcomes: See above   Remaining deficits: See above   Education / Equipment: Anatomy of  condition, POC, HEP, exercise form/rationale  Plan: Patient agrees to discharge.  Patient goals were met. Patient is being discharged due to meeting the stated rehab goals.  ?????     Coley Kulikowski C. Nicandro Perrault PT, DPT 12-25-2016 11:38 AM   [place] [address] [place], [place], [address] Phone: [phone]   Fax:  [phone]  Name: Sharon Spears MRN: [id number] Date of Birth: Oct 16, 1945

## 2016-12-07 ENCOUNTER — Encounter: Payer: Medicare Other | Admitting: Physical Therapy

## 2016-12-08 ENCOUNTER — Ambulatory Visit: Payer: Medicare Other | Admitting: Physical Therapy

## 2016-12-09 ENCOUNTER — Encounter: Payer: Medicare Other | Admitting: Physical Therapy

## 2017-02-23 DIAGNOSIS — I10 Essential (primary) hypertension: Secondary | ICD-10-CM | POA: Diagnosis not present

## 2017-02-23 DIAGNOSIS — E119 Type 2 diabetes mellitus without complications: Secondary | ICD-10-CM | POA: Diagnosis not present

## 2017-02-23 DIAGNOSIS — G4700 Insomnia, unspecified: Secondary | ICD-10-CM | POA: Diagnosis not present

## 2017-02-23 DIAGNOSIS — I509 Heart failure, unspecified: Secondary | ICD-10-CM | POA: Diagnosis not present

## 2017-03-04 ENCOUNTER — Other Ambulatory Visit: Payer: Self-pay | Admitting: Family Medicine

## 2017-03-04 DIAGNOSIS — R748 Abnormal levels of other serum enzymes: Secondary | ICD-10-CM

## 2017-03-15 ENCOUNTER — Ambulatory Visit
Admission: RE | Admit: 2017-03-15 | Discharge: 2017-03-15 | Disposition: A | Discharge status: Home / Self Care | Payer: Medicare Other | Source: Ambulatory Visit | Attending: Family Medicine | Admitting: Family Medicine

## 2017-03-15 DIAGNOSIS — R748 Abnormal levels of other serum enzymes: Secondary | ICD-10-CM

## 2017-04-13 ENCOUNTER — Ambulatory Visit (HOSPITAL_COMMUNITY)
Admission: EM | Admit: 2017-04-13 | Discharge: 2017-04-13 | Disposition: A | Discharge status: Home / Self Care | Payer: Medicare Other | Attending: Family Medicine | Admitting: Family Medicine

## 2017-04-13 ENCOUNTER — Encounter (HOSPITAL_COMMUNITY): Payer: Self-pay | Admitting: Emergency Medicine

## 2017-04-13 DIAGNOSIS — I2510 Atherosclerotic heart disease of native coronary artery without angina pectoris: Secondary | ICD-10-CM | POA: Diagnosis not present

## 2017-04-13 DIAGNOSIS — I10 Essential (primary) hypertension: Secondary | ICD-10-CM | POA: Diagnosis not present

## 2017-04-13 DIAGNOSIS — E119 Type 2 diabetes mellitus without complications: Secondary | ICD-10-CM | POA: Diagnosis not present

## 2017-04-13 DIAGNOSIS — J9621 Acute and chronic respiratory failure with hypoxia: Secondary | ICD-10-CM | POA: Diagnosis not present

## 2017-04-13 DIAGNOSIS — J040 Acute laryngitis: Secondary | ICD-10-CM | POA: Insufficient documentation

## 2017-04-13 DIAGNOSIS — Z7982 Long term (current) use of aspirin: Secondary | ICD-10-CM | POA: Insufficient documentation

## 2017-04-13 DIAGNOSIS — Z951 Presence of aortocoronary bypass graft: Secondary | ICD-10-CM | POA: Insufficient documentation

## 2017-04-13 DIAGNOSIS — R1110 Vomiting, unspecified: Secondary | ICD-10-CM | POA: Insufficient documentation

## 2017-04-13 DIAGNOSIS — D696 Thrombocytopenia, unspecified: Secondary | ICD-10-CM | POA: Insufficient documentation

## 2017-04-13 DIAGNOSIS — J45901 Unspecified asthma with (acute) exacerbation: Secondary | ICD-10-CM | POA: Diagnosis not present

## 2017-04-13 DIAGNOSIS — R1013 Epigastric pain: Secondary | ICD-10-CM | POA: Diagnosis present

## 2017-04-13 DIAGNOSIS — K859 Acute pancreatitis without necrosis or infection, unspecified: Secondary | ICD-10-CM

## 2017-04-13 DIAGNOSIS — R112 Nausea with vomiting, unspecified: Secondary | ICD-10-CM

## 2017-04-13 DIAGNOSIS — Z7984 Long term (current) use of oral hypoglycemic drugs: Secondary | ICD-10-CM | POA: Insufficient documentation

## 2017-04-13 DIAGNOSIS — Z79899 Other long term (current) drug therapy: Secondary | ICD-10-CM | POA: Insufficient documentation

## 2017-04-13 DIAGNOSIS — R197 Diarrhea, unspecified: Secondary | ICD-10-CM

## 2017-04-13 DIAGNOSIS — R002 Palpitations: Secondary | ICD-10-CM | POA: Insufficient documentation

## 2017-04-13 LAB — COMPREHENSIVE METABOLIC PANEL
ALBUMIN: 3.6 g/dL (ref 3.5–5.0)
ALK PHOS: 97 U/L (ref 38–126)
ALT: 73 U/L — ABNORMAL HIGH (ref 14–54)
AST: 52 U/L — AB (ref 15–41)
Anion gap: 10 (ref 5–15)
BUN: 8 mg/dL (ref 6–20)
CALCIUM: 9.1 mg/dL (ref 8.9–10.3)
CO2: 23 mmol/L (ref 22–32)
CREATININE: 0.64 mg/dL (ref 0.44–1.00)
Chloride: 103 mmol/L (ref 101–111)
GFR calc Af Amer: >60 mL/min (ref >60)
GLUCOSE: 129 mg/dL — AB (ref 65–99)
Potassium: 4 mmol/L (ref 3.5–5.1)
Sodium: 136 mmol/L (ref 135–145)
TOTAL PROTEIN: 7.3 g/dL (ref 6.5–8.1)
Total Bilirubin: 1.1 mg/dL (ref 0.3–1.2)

## 2017-04-13 LAB — POCT I-STAT, CHEM 8
BUN: 12 mg/dL (ref 6–20)
CALCIUM ION: 1.02 mmol/L — AB (ref 1.15–1.40)
Chloride: 103 mmol/L (ref 101–111)
Creatinine, Ser: 0.6 mg/dL (ref 0.44–1.00)
GLUCOSE: 149 mg/dL — AB (ref 65–99)
HCT: 46 % (ref 36.0–46.0)
Hemoglobin: 15.6 g/dL — ABNORMAL HIGH (ref 12.0–15.0)
POTASSIUM: 4.5 mmol/L (ref 3.5–5.1)
SODIUM: 138 mmol/L (ref 135–145)
TCO2: 25 mmol/L (ref 22–32)

## 2017-04-13 LAB — POCT URINALYSIS DIP (DEVICE)
Bilirubin Urine: NEGATIVE
GLUCOSE, UA: NEGATIVE mg/dL
KETONES UR: NEGATIVE mg/dL
LEUKOCYTES UA: NEGATIVE
Nitrite: NEGATIVE
Protein, ur: 30 mg/dL — AB
SPECIFIC GRAVITY, URINE: 1.02 (ref 1.005–1.030)
Urobilinogen, UA: 0.2 mg/dL (ref 0.0–1.0)
pH: 5.5 (ref 5.0–8.0)

## 2017-04-13 LAB — LIPASE, BLOOD: LIPASE: 67 U/L — AB (ref 11–51)

## 2017-04-13 MED ORDER — KETOROLAC TROMETHAMINE 15 MG/ML IJ SOLN
15.0000 mg | Freq: Once | INTRAMUSCULAR | Status: AC
  Administered 2017-04-13: 15 mg via INTRAVENOUS

## 2017-04-13 MED ORDER — SODIUM CHLORIDE 0.9 % IV BOLUS
1000.0000 mL | Freq: Once | INTRAVENOUS | Status: AC
  Administered 2017-04-13: 1000 mL via INTRAVENOUS

## 2017-04-13 MED ORDER — KETOROLAC TROMETHAMINE 30 MG/ML IJ SOLN
INTRAMUSCULAR | Status: AC
  Filled 2017-04-13: qty 1

## 2017-04-13 MED ORDER — ONDANSETRON 4 MG PO TBDP: 4.0000 mg | ORAL_TABLET | Freq: Three times a day (TID) | ORAL | 0 refills | Status: DC | PRN

## 2017-04-13 NOTE — ED Provider Notes (Signed)
[place]    CSN: [id number] Arrival date & time: 04/13/17  8675     History   Chief Complaint Chief Complaint  Patient presents with  . Abdominal Pain  . Emesis    HPI Sharon Spears is a 72 y.o. female.   72 year old female with history of DM, asthma, CAD post CABG, HTN, comes in for 6-day history of nausea, vomiting, diarrhea.  She has had 3 episodes of nonbilious nonbloody vomit when symptoms first started, and one episode daily since symptom onset.  She has had 2-3 episodes of diarrhea per day.  Denies blood in stool.  Has not been able to keep fluids or food down.  She is had intermittent epigastric pain that is worse with food intake, and worse at night.  Describes the pain as burning sensation, and can radiate to the back.   positive sick contact.  T-max 99-100.  Denies URI symptoms such as cough, congestion, sore throat.  Denies recent antibiotic use.  Denies recent travel.  She has not been able to keep any medications down since symptom onset.  States CBG maintained around 130s.  Denies alcohol use, smoking, fatty food intake.  States has history of pancreatitis, and does state that feels similar to that.     Past Medical History:  Diagnosis Date  . Asthma   . Coronary artery disease   . Diabetes mellitus   . Heart palpitations   . Hypertension     Patient Active Problem List   Diagnosis Date Noted  . Heart palpitations 06/02/2016  . Acute on chronic respiratory failure with hypoxia ([place]) 03/20/2015  . Asthma exacerbation 03/20/2015  . Laryngitis, acute 03/20/2015  . Thrombocytopenia ([place]) 03/20/2015  . Diabetes mellitus type 2, controlled ([place]) 03/20/2015  . HTN (hypertension) 03/20/2015  . S/P CABG (coronary artery bypass graft) 03/20/2015    Past Surgical History:  Procedure Laterality Date  . CESAREAN SECTION    . CORONARY ARTERY BYPASS GRAFT    . ESOPHAGEAL MANOMETRY N/A 03/06/2012   Procedure: ESOPHAGEAL MANOMETRY (EM);  Surgeon: Beryle Beams, MD;  Location: [facility];  Service: Endoscopy;  Laterality: N/A;  . TONSILLECTOMY      OB History   None      Home Medications    Prior to Admission medications   Medication Sig Start Date End Date Taking? Authorizing Provider  albuterol (PROVENTIL) (2.5 MG/3ML) 0.083% nebulizer solution Take 3 mLs (2.5 mg total) by nebulization every 6 (six) hours as needed for wheezing or shortness of breath. 11/21/15  Yes Phelps, Erin O, PA-C  ALPRAZolam Duanne Moron) 1 MG tablet Take 1 mg by mouth 2 (two) times daily as needed. For anxiety.   Yes [provider]  aspirin 81 MG tablet Take 81 mg by mouth daily.   Yes [provider]  ibuprofen (ADVIL,MOTRIN) 400 MG tablet Take 1 tablet (400 mg total) by mouth every 6 (six) hours as needed. 06/23/15  Yes Sam, Serena Y, PA-C  metFORMIN (GLUCOPHAGE XR) 500 MG 24 hr tablet Take 1 tablet (500 mg total) by mouth daily with breakfast. 06/02/16  Yes Golden Circle, FNP  metoprolol tartrate (LOPRESSOR) 25 MG tablet Take 25 mg by mouth 2 (two) times daily.  04/21/13  Yes [provider]  albuterol (PROVENTIL HFA;VENTOLIN HFA) 108 (90 BASE) MCG/ACT inhaler Inhale 2 puffs into the lungs every 6 (six) hours as needed. For wheezing.    [provider]  B Complex Vitamins (B COMPLEX PO) Take 1  capsule by mouth daily.    [provider]  famotidine (PEPCID) 20 MG tablet Take 20 mg by mouth 2 (two) times daily. 02/04/15   [provider]  fluticasone (FLONASE) 50 MCG/ACT nasal spray Place 2 sprays into both nostrils 2 (two) times daily. 10/25/15   Jacqualine Mau, NP  nitroGLYCERIN (NITROSTAT) 0.4 MG SL tablet Place 0.4 mg under the tongue every 5 (five) minutes as needed. For chest pain.    [provider]  ondansetron (ZOFRAN ODT) 4 MG disintegrating tablet Take 1 tablet (4 mg total) by mouth every 8 (eight) hours as needed for nausea or vomiting. 04/13/17   Ok Edwards, PA-C  Spacer/Aero-Holding  Chambers (AEROCHAMBER PLUS WITH MASK) inhaler Use as instructed 11/21/15   Noe Gens, PA-C    Family History Family History  Problem Relation Age of Onset  . Asthma Mother   . Alcohol abuse Father     Social History Social History   Tobacco Use  . Smoking status: Never Smoker  . Smokeless tobacco: Never Used  Substance Use Topics  . Alcohol use: No  . Drug use: No     Allergies   Penicillins   Review of Systems Review of Systems  Reason unable to perform ROS: See HPI as above.     Physical Exam Triage Vital Signs ED Triage Vitals  Enc Vitals Group     BP 04/13/17 1017 (!) 178/80     Pulse Rate 04/13/17 1017 79     Resp 04/13/17 1017 16     Temp 04/13/17 1017 98.9 F (37.2 C)     Temp Source 04/13/17 1017 Oral     SpO2 04/13/17 1017 96 %     Weight 04/13/17 1017 151 lb (68.5 kg)     Height --      Head Circumference --      Peak Flow --      Pain Score 04/13/17 1016 9     Pain Loc --      Pain Edu? --      Excl. in [place]? --    No data found.  Updated Vital Signs BP (!) 178/80   Pulse 79   Temp 98.9 F (37.2 C) (Oral)   Resp 16   Wt 151 lb (68.5 kg)   SpO2 96%   BMI 26.75 kg/m   Physical Exam  Constitutional: She is oriented to person, place, and time. She appears well-developed and well-nourished.  Patient looks uncomfortable, but nontoxic in appearance, non-ill appearance, no acute distress.  HENT:  Head: Normocephalic and atraumatic.  Eyes: Pupils are equal, round, and reactive to light. Conjunctivae are normal.  Cardiovascular: Normal rate, regular rhythm and normal heart sounds. Exam reveals no gallop and no friction rub.  No murmur heard. Pulmonary/Chest: Effort normal and breath sounds normal. No accessory muscle usage or stridor. No respiratory distress. She has no decreased breath sounds. She has no wheezes. She has no rhonchi. She has no rales.  Abdominal: Soft. Bowel sounds are normal. She exhibits no mass. There is no rigidity, no  rebound, no guarding and no CVA tenderness.  Moderate generalized tenderness to palpation, most significant at epigastric region.  No obvious guarding or rebound.  No rigidity.  Neurological: She is alert and oriented to person, place, and time.  Skin: Skin is warm and dry.  Psychiatric: She has a normal mood and affect. Her behavior is normal. Judgment normal.     UC Treatments / Results  Labs (all labs ordered are listed, but only abnormal results are displayed) Labs Reviewed  COMPREHENSIVE METABOLIC PANEL - Abnormal; Notable for the following components:      Result Value   Glucose, Bld 129 (*)    AST 52 (*)    ALT 73 (*)    All other components within normal limits  LIPASE, BLOOD - Abnormal; Notable for the following components:   Lipase 67 (*)    All other components within normal limits  POCT I-STAT, CHEM 8 - Abnormal; Notable for the following components:   Glucose, Bld 149 (*)    Calcium, Ion 1.02 (*)    Hemoglobin 15.6 (*)    All other components within normal limits  POCT URINALYSIS DIP (DEVICE) - Abnormal; Notable for the following components:   Hgb urine dipstick TRACE (*)    Protein, ur 30 (*)    All other components within normal limits    EKG None Radiology No results found.  Procedures Procedures (including critical care time)  Medications Ordered in UC Medications  sodium chloride 0.9 % bolus 1,000 mL (0 mLs Intravenous Stopped 04/13/17 1257)  ketorolac (TORADOL) 15 MG/ML injection 15 mg (15 mg Intravenous Given 04/13/17 1126)     Initial Impression / Assessment and Plan / UC Course  I have reviewed the triage vital signs and the nursing notes.  Pertinent labs & imaging results that were available during my care of the patient were reviewed by me and considered in my medical decision making (see chart for details).    I-STAT without significant elevated glucose, creatinine within normal limits.  Discussed with patient worries for dehydration versus  pancreatitis given history and exam.  Patient would like to defer ED visit.  Will start 1 L bolus, draw CMP, lipase for further evaluation.  Lipase slightly elevated at 67, slightly elevated AST and ALT as well.  Patient with much improved symptoms after Toradol and 1 L fluid.  Patient with history of LFT elevations.  Will have patient remain liquid diet, Zofran for nausea/vomiting.  Can  advance to bland diet once epigastric pain resolved.  Strict return precautions given.  Patient expresses understanding and agrees to plan.  Case discussed with Dr. Mannie Stabile, who agrees to plan.  Final Clinical Impressions(s) / UC Diagnoses   Final diagnoses:  Acute pancreatitis, unspecified complication status, unspecified pancreatitis type  Nausea vomiting and diarrhea    ED Discharge Orders        Ordered    ondansetron (ZOFRAN ODT) 4 MG disintegrating tablet  Every 8 hours PRN     04/13/17 1310       Ok Edwards, [place] 04/13/17 1951

## 2017-04-13 NOTE — Discharge Instructions (Signed)
Your pancreatic enzymes are elevated. 1 L fluid today. Continue liquid diet until upper stomach pain is improving. Zofran for nausea/vomiting so you can take your medicines. When stomach pain is improving, start bland diet, and increase as tolerated. Please follow up with your PCP in 1 week for recheck. If experiencing worsening abdominal pain, nausea/vomiting not controlled by medicine, fever, weakness, dizziness, please go to the emergency department for further evaluation.

## 2017-04-13 NOTE — ED Triage Notes (Signed)
PT reports epigastric pain, vomiting, and diarrhea since Thursday. PT has not been able to keep anything down. Pt is diabetic.

## 2017-04-13 NOTE — ED Notes (Signed)
Pt ambulated to restroom without assistance.

## 2017-06-21 DIAGNOSIS — R0602 Shortness of breath: Secondary | ICD-10-CM | POA: Diagnosis not present

## 2017-06-21 DIAGNOSIS — R509 Fever, unspecified: Secondary | ICD-10-CM | POA: Diagnosis not present

## 2017-06-21 DIAGNOSIS — R062 Wheezing: Secondary | ICD-10-CM | POA: Diagnosis not present

## 2017-06-21 DIAGNOSIS — I517 Cardiomegaly: Secondary | ICD-10-CM | POA: Diagnosis not present

## 2017-06-21 DIAGNOSIS — I340 Nonrheumatic mitral (valve) insufficiency: Secondary | ICD-10-CM | POA: Diagnosis not present

## 2017-06-21 DIAGNOSIS — R52 Pain, unspecified: Secondary | ICD-10-CM | POA: Diagnosis not present

## 2017-06-21 DIAGNOSIS — F419 Anxiety disorder, unspecified: Secondary | ICD-10-CM | POA: Diagnosis not present

## 2017-06-21 DIAGNOSIS — R079 Chest pain, unspecified: Secondary | ICD-10-CM | POA: Diagnosis not present

## 2017-06-21 DIAGNOSIS — J984 Other disorders of lung: Secondary | ICD-10-CM | POA: Diagnosis not present

## 2017-06-21 DIAGNOSIS — I10 Essential (primary) hypertension: Secondary | ICD-10-CM | POA: Diagnosis not present

## 2017-06-21 DIAGNOSIS — R0789 Other chest pain: Secondary | ICD-10-CM | POA: Diagnosis not present

## 2017-06-21 DIAGNOSIS — E119 Type 2 diabetes mellitus without complications: Secondary | ICD-10-CM | POA: Diagnosis not present

## 2017-06-21 DIAGNOSIS — J45909 Unspecified asthma, uncomplicated: Secondary | ICD-10-CM | POA: Diagnosis not present

## 2017-06-21 DIAGNOSIS — R918 Other nonspecific abnormal finding of lung field: Secondary | ICD-10-CM | POA: Diagnosis not present

## 2017-06-21 DIAGNOSIS — R05 Cough: Secondary | ICD-10-CM | POA: Diagnosis not present

## 2017-06-21 DIAGNOSIS — I361 Nonrheumatic tricuspid (valve) insufficiency: Secondary | ICD-10-CM | POA: Diagnosis not present

## 2017-06-22 DIAGNOSIS — J45909 Unspecified asthma, uncomplicated: Secondary | ICD-10-CM | POA: Diagnosis not present

## 2017-06-22 DIAGNOSIS — J45901 Unspecified asthma with (acute) exacerbation: Secondary | ICD-10-CM | POA: Diagnosis not present

## 2017-06-22 DIAGNOSIS — R0789 Other chest pain: Secondary | ICD-10-CM | POA: Diagnosis not present

## 2017-06-22 DIAGNOSIS — I517 Cardiomegaly: Secondary | ICD-10-CM | POA: Diagnosis not present

## 2017-06-22 DIAGNOSIS — R079 Chest pain, unspecified: Secondary | ICD-10-CM | POA: Diagnosis not present

## 2017-06-22 DIAGNOSIS — I871 Compression of vein: Secondary | ICD-10-CM | POA: Diagnosis not present

## 2017-06-22 DIAGNOSIS — F419 Anxiety disorder, unspecified: Secondary | ICD-10-CM | POA: Diagnosis not present

## 2017-06-22 DIAGNOSIS — I10 Essential (primary) hypertension: Secondary | ICD-10-CM | POA: Diagnosis not present

## 2017-06-22 DIAGNOSIS — I340 Nonrheumatic mitral (valve) insufficiency: Secondary | ICD-10-CM | POA: Diagnosis not present

## 2017-06-22 DIAGNOSIS — J441 Chronic obstructive pulmonary disease with (acute) exacerbation: Secondary | ICD-10-CM | POA: Diagnosis not present

## 2017-06-22 DIAGNOSIS — I361 Nonrheumatic tricuspid (valve) insufficiency: Secondary | ICD-10-CM | POA: Diagnosis not present

## 2017-06-22 DIAGNOSIS — E119 Type 2 diabetes mellitus without complications: Secondary | ICD-10-CM | POA: Diagnosis not present

## 2017-06-23 DIAGNOSIS — R0602 Shortness of breath: Secondary | ICD-10-CM | POA: Diagnosis not present

## 2017-06-23 DIAGNOSIS — I38 Endocarditis, valve unspecified: Secondary | ICD-10-CM | POA: Diagnosis not present

## 2017-06-23 DIAGNOSIS — I10 Essential (primary) hypertension: Secondary | ICD-10-CM | POA: Diagnosis not present

## 2017-06-23 DIAGNOSIS — E119 Type 2 diabetes mellitus without complications: Secondary | ICD-10-CM | POA: Diagnosis not present

## 2017-06-23 DIAGNOSIS — R918 Other nonspecific abnormal finding of lung field: Secondary | ICD-10-CM | POA: Diagnosis not present

## 2017-06-23 DIAGNOSIS — I272 Pulmonary hypertension, unspecified: Secondary | ICD-10-CM | POA: Diagnosis not present

## 2017-06-23 DIAGNOSIS — J441 Chronic obstructive pulmonary disease with (acute) exacerbation: Secondary | ICD-10-CM | POA: Diagnosis not present

## 2017-06-23 DIAGNOSIS — J189 Pneumonia, unspecified organism: Secondary | ICD-10-CM | POA: Diagnosis not present

## 2017-06-23 DIAGNOSIS — J45901 Unspecified asthma with (acute) exacerbation: Secondary | ICD-10-CM | POA: Diagnosis not present

## 2017-06-23 DIAGNOSIS — I330 Acute and subacute infective endocarditis: Secondary | ICD-10-CM | POA: Diagnosis not present

## 2017-06-23 DIAGNOSIS — R509 Fever, unspecified: Secondary | ICD-10-CM | POA: Diagnosis not present

## 2017-06-23 DIAGNOSIS — J90 Pleural effusion, not elsewhere classified: Secondary | ICD-10-CM | POA: Diagnosis not present

## 2017-06-24 DIAGNOSIS — R509 Fever, unspecified: Secondary | ICD-10-CM | POA: Diagnosis not present

## 2017-06-24 DIAGNOSIS — J45901 Unspecified asthma with (acute) exacerbation: Secondary | ICD-10-CM | POA: Diagnosis not present

## 2017-06-24 DIAGNOSIS — R918 Other nonspecific abnormal finding of lung field: Secondary | ICD-10-CM | POA: Diagnosis not present

## 2017-06-24 DIAGNOSIS — E119 Type 2 diabetes mellitus without complications: Secondary | ICD-10-CM | POA: Diagnosis not present

## 2017-06-24 DIAGNOSIS — I517 Cardiomegaly: Secondary | ICD-10-CM | POA: Diagnosis not present

## 2017-06-24 DIAGNOSIS — J189 Pneumonia, unspecified organism: Secondary | ICD-10-CM | POA: Diagnosis not present

## 2017-06-24 DIAGNOSIS — I330 Acute and subacute infective endocarditis: Secondary | ICD-10-CM | POA: Diagnosis not present

## 2017-06-24 DIAGNOSIS — R0602 Shortness of breath: Secondary | ICD-10-CM | POA: Diagnosis not present

## 2017-06-24 DIAGNOSIS — J90 Pleural effusion, not elsewhere classified: Secondary | ICD-10-CM | POA: Diagnosis not present

## 2017-06-25 DIAGNOSIS — J189 Pneumonia, unspecified organism: Secondary | ICD-10-CM | POA: Diagnosis not present

## 2017-06-25 DIAGNOSIS — E1165 Type 2 diabetes mellitus with hyperglycemia: Secondary | ICD-10-CM | POA: Diagnosis not present

## 2017-06-25 DIAGNOSIS — I330 Acute and subacute infective endocarditis: Secondary | ICD-10-CM | POA: Diagnosis not present

## 2017-06-25 DIAGNOSIS — J45901 Unspecified asthma with (acute) exacerbation: Secondary | ICD-10-CM | POA: Diagnosis not present

## 2017-06-25 DIAGNOSIS — J90 Pleural effusion, not elsewhere classified: Secondary | ICD-10-CM | POA: Diagnosis not present

## 2017-06-26 DIAGNOSIS — J984 Other disorders of lung: Secondary | ICD-10-CM | POA: Diagnosis not present

## 2017-06-27 DIAGNOSIS — E119 Type 2 diabetes mellitus without complications: Secondary | ICD-10-CM | POA: Diagnosis not present

## 2017-06-27 DIAGNOSIS — J189 Pneumonia, unspecified organism: Secondary | ICD-10-CM | POA: Diagnosis not present

## 2017-06-27 DIAGNOSIS — J90 Pleural effusion, not elsewhere classified: Secondary | ICD-10-CM | POA: Diagnosis not present

## 2017-06-28 DIAGNOSIS — J90 Pleural effusion, not elsewhere classified: Secondary | ICD-10-CM | POA: Diagnosis not present

## 2017-06-28 DIAGNOSIS — E119 Type 2 diabetes mellitus without complications: Secondary | ICD-10-CM | POA: Diagnosis not present

## 2017-06-28 DIAGNOSIS — I10 Essential (primary) hypertension: Secondary | ICD-10-CM | POA: Diagnosis not present

## 2017-06-28 DIAGNOSIS — J189 Pneumonia, unspecified organism: Secondary | ICD-10-CM | POA: Diagnosis not present

## 2017-06-28 DIAGNOSIS — J984 Other disorders of lung: Secondary | ICD-10-CM | POA: Diagnosis not present

## 2017-06-28 DIAGNOSIS — J9811 Atelectasis: Secondary | ICD-10-CM | POA: Diagnosis not present

## 2017-06-28 DIAGNOSIS — I440 Atrioventricular block, first degree: Secondary | ICD-10-CM | POA: Diagnosis not present

## 2017-06-29 DIAGNOSIS — I272 Pulmonary hypertension, unspecified: Secondary | ICD-10-CM | POA: Diagnosis not present

## 2017-06-29 DIAGNOSIS — I110 Hypertensive heart disease with heart failure: Secondary | ICD-10-CM | POA: Diagnosis not present

## 2017-06-29 DIAGNOSIS — J90 Pleural effusion, not elsewhere classified: Secondary | ICD-10-CM | POA: Diagnosis not present

## 2017-06-29 DIAGNOSIS — I5020 Unspecified systolic (congestive) heart failure: Secondary | ICD-10-CM | POA: Diagnosis not present

## 2017-06-29 DIAGNOSIS — I38 Endocarditis, valve unspecified: Secondary | ICD-10-CM | POA: Diagnosis not present

## 2017-07-06 HISTORY — PX: CORONARY ARTERY BYPASS GRAFT: SHX141

## 2017-08-15 ENCOUNTER — Encounter: Payer: Self-pay | Admitting: Cardiology

## 2017-08-19 DIAGNOSIS — I509 Heart failure, unspecified: Secondary | ICD-10-CM | POA: Diagnosis not present

## 2017-08-19 DIAGNOSIS — I2510 Atherosclerotic heart disease of native coronary artery without angina pectoris: Secondary | ICD-10-CM | POA: Diagnosis not present

## 2017-08-19 DIAGNOSIS — I4891 Unspecified atrial fibrillation: Secondary | ICD-10-CM | POA: Diagnosis not present

## 2017-08-19 DIAGNOSIS — E119 Type 2 diabetes mellitus without complications: Secondary | ICD-10-CM | POA: Diagnosis not present

## 2017-09-03 DIAGNOSIS — R300 Dysuria: Secondary | ICD-10-CM | POA: Diagnosis not present

## 2017-09-03 DIAGNOSIS — N760 Acute vaginitis: Secondary | ICD-10-CM | POA: Diagnosis not present

## 2017-09-05 DIAGNOSIS — G4700 Insomnia, unspecified: Secondary | ICD-10-CM | POA: Diagnosis not present

## 2017-09-15 DIAGNOSIS — I509 Heart failure, unspecified: Secondary | ICD-10-CM | POA: Diagnosis not present

## 2017-09-15 DIAGNOSIS — Q211 Atrial septal defect: Secondary | ICD-10-CM | POA: Diagnosis not present

## 2017-09-15 DIAGNOSIS — B961 Klebsiella pneumoniae [K. pneumoniae] as the cause of diseases classified elsewhere: Secondary | ICD-10-CM | POA: Diagnosis not present

## 2017-09-15 DIAGNOSIS — G92 Toxic encephalopathy: Secondary | ICD-10-CM | POA: Diagnosis not present

## 2017-09-15 DIAGNOSIS — N390 Urinary tract infection, site not specified: Secondary | ICD-10-CM | POA: Diagnosis not present

## 2017-09-15 DIAGNOSIS — R0789 Other chest pain: Secondary | ICD-10-CM | POA: Diagnosis not present

## 2017-09-15 DIAGNOSIS — J984 Other disorders of lung: Secondary | ICD-10-CM | POA: Diagnosis not present

## 2017-09-15 DIAGNOSIS — R079 Chest pain, unspecified: Secondary | ICD-10-CM | POA: Diagnosis not present

## 2017-09-15 DIAGNOSIS — T424X5A Adverse effect of benzodiazepines, initial encounter: Secondary | ICD-10-CM | POA: Diagnosis not present

## 2017-09-16 DIAGNOSIS — F419 Anxiety disorder, unspecified: Secondary | ICD-10-CM | POA: Diagnosis present

## 2017-09-16 DIAGNOSIS — G459 Transient cerebral ischemic attack, unspecified: Secondary | ICD-10-CM | POA: Diagnosis not present

## 2017-09-16 DIAGNOSIS — G9340 Encephalopathy, unspecified: Secondary | ICD-10-CM | POA: Diagnosis not present

## 2017-09-16 DIAGNOSIS — I083 Combined rheumatic disorders of mitral, aortic and tricuspid valves: Secondary | ICD-10-CM | POA: Diagnosis present

## 2017-09-16 DIAGNOSIS — M25511 Pain in right shoulder: Secondary | ICD-10-CM | POA: Diagnosis not present

## 2017-09-16 DIAGNOSIS — I4891 Unspecified atrial fibrillation: Secondary | ICD-10-CM | POA: Diagnosis not present

## 2017-09-16 DIAGNOSIS — B961 Klebsiella pneumoniae [K. pneumoniae] as the cause of diseases classified elsewhere: Secondary | ICD-10-CM | POA: Diagnosis present

## 2017-09-16 DIAGNOSIS — I10 Essential (primary) hypertension: Secondary | ICD-10-CM | POA: Diagnosis not present

## 2017-09-16 DIAGNOSIS — I081 Rheumatic disorders of both mitral and tricuspid valves: Secondary | ICD-10-CM | POA: Diagnosis not present

## 2017-09-16 DIAGNOSIS — E119 Type 2 diabetes mellitus without complications: Secondary | ICD-10-CM | POA: Diagnosis not present

## 2017-09-16 DIAGNOSIS — R410 Disorientation, unspecified: Secondary | ICD-10-CM | POA: Diagnosis not present

## 2017-09-16 DIAGNOSIS — F329 Major depressive disorder, single episode, unspecified: Secondary | ICD-10-CM | POA: Diagnosis present

## 2017-09-16 DIAGNOSIS — I482 Chronic atrial fibrillation: Secondary | ICD-10-CM | POA: Diagnosis present

## 2017-09-16 DIAGNOSIS — I2510 Atherosclerotic heart disease of native coronary artery without angina pectoris: Secondary | ICD-10-CM | POA: Diagnosis not present

## 2017-09-16 DIAGNOSIS — Z2821 Immunization not carried out because of patient refusal: Secondary | ICD-10-CM | POA: Diagnosis not present

## 2017-09-16 DIAGNOSIS — Z952 Presence of prosthetic heart valve: Secondary | ICD-10-CM | POA: Diagnosis not present

## 2017-09-16 DIAGNOSIS — R079 Chest pain, unspecified: Secondary | ICD-10-CM | POA: Diagnosis not present

## 2017-09-16 DIAGNOSIS — G92 Toxic encephalopathy: Secondary | ICD-10-CM | POA: Diagnosis present

## 2017-09-16 DIAGNOSIS — Z8673 Personal history of transient ischemic attack (TIA), and cerebral infarction without residual deficits: Secondary | ICD-10-CM | POA: Diagnosis not present

## 2017-09-16 DIAGNOSIS — Q211 Atrial septal defect: Secondary | ICD-10-CM | POA: Diagnosis not present

## 2017-09-16 DIAGNOSIS — Z880 Allergy status to penicillin: Secondary | ICD-10-CM | POA: Diagnosis not present

## 2017-09-16 DIAGNOSIS — T424X5A Adverse effect of benzodiazepines, initial encounter: Secondary | ICD-10-CM | POA: Diagnosis present

## 2017-09-16 DIAGNOSIS — R0602 Shortness of breath: Secondary | ICD-10-CM | POA: Diagnosis not present

## 2017-09-16 DIAGNOSIS — N390 Urinary tract infection, site not specified: Secondary | ICD-10-CM | POA: Diagnosis present

## 2017-09-16 DIAGNOSIS — Z794 Long term (current) use of insulin: Secondary | ICD-10-CM | POA: Diagnosis not present

## 2017-09-16 DIAGNOSIS — Z9889 Other specified postprocedural states: Secondary | ICD-10-CM | POA: Diagnosis not present

## 2017-09-16 DIAGNOSIS — Z7984 Long term (current) use of oral hypoglycemic drugs: Secondary | ICD-10-CM | POA: Diagnosis not present

## 2017-09-16 DIAGNOSIS — I509 Heart failure, unspecified: Secondary | ICD-10-CM | POA: Diagnosis not present

## 2017-09-16 DIAGNOSIS — Z1624 Resistance to multiple antibiotics: Secondary | ICD-10-CM | POA: Diagnosis present

## 2017-09-16 DIAGNOSIS — F4312 Post-traumatic stress disorder, chronic: Secondary | ICD-10-CM | POA: Diagnosis present

## 2017-09-16 DIAGNOSIS — R569 Unspecified convulsions: Secondary | ICD-10-CM | POA: Diagnosis not present

## 2017-09-16 DIAGNOSIS — I110 Hypertensive heart disease with heart failure: Secondary | ICD-10-CM | POA: Diagnosis present

## 2017-09-25 ENCOUNTER — Inpatient Hospital Stay (HOSPITAL_COMMUNITY)
Admission: EM | Admit: 2017-09-25 | Discharge: 2017-09-28 | DRG: 291 | Disposition: A | Discharge status: Home / Self Care | Payer: Medicare Other | Attending: Cardiology | Admitting: Cardiology

## 2017-09-25 ENCOUNTER — Encounter (HOSPITAL_COMMUNITY): Payer: Self-pay | Admitting: Emergency Medicine

## 2017-09-25 ENCOUNTER — Emergency Department (HOSPITAL_COMMUNITY): Payer: Medicare Other

## 2017-09-25 ENCOUNTER — Other Ambulatory Visit: Payer: Self-pay

## 2017-09-25 DIAGNOSIS — I2510 Atherosclerotic heart disease of native coronary artery without angina pectoris: Secondary | ICD-10-CM | POA: Diagnosis not present

## 2017-09-25 DIAGNOSIS — Z951 Presence of aortocoronary bypass graft: Secondary | ICD-10-CM | POA: Diagnosis not present

## 2017-09-25 DIAGNOSIS — T501X6A Underdosing of loop [high-ceiling] diuretics, initial encounter: Secondary | ICD-10-CM | POA: Diagnosis not present

## 2017-09-25 DIAGNOSIS — R0902 Hypoxemia: Secondary | ICD-10-CM | POA: Diagnosis not present

## 2017-09-25 DIAGNOSIS — R0602 Shortness of breath: Secondary | ICD-10-CM

## 2017-09-25 DIAGNOSIS — E119 Type 2 diabetes mellitus without complications: Secondary | ICD-10-CM | POA: Diagnosis present

## 2017-09-25 DIAGNOSIS — Z7984 Long term (current) use of oral hypoglycemic drugs: Secondary | ICD-10-CM

## 2017-09-25 DIAGNOSIS — I482 Chronic atrial fibrillation: Secondary | ICD-10-CM | POA: Diagnosis not present

## 2017-09-25 DIAGNOSIS — Z7982 Long term (current) use of aspirin: Secondary | ICD-10-CM

## 2017-09-25 DIAGNOSIS — Z952 Presence of prosthetic heart valve: Secondary | ICD-10-CM | POA: Diagnosis not present

## 2017-09-25 DIAGNOSIS — R609 Edema, unspecified: Secondary | ICD-10-CM

## 2017-09-25 DIAGNOSIS — I110 Hypertensive heart disease with heart failure: Principal | ICD-10-CM | POA: Diagnosis present

## 2017-09-25 DIAGNOSIS — Z91138 Patient's unintentional underdosing of medication regimen for other reason: Secondary | ICD-10-CM

## 2017-09-25 DIAGNOSIS — I4891 Unspecified atrial fibrillation: Secondary | ICD-10-CM | POA: Diagnosis not present

## 2017-09-25 DIAGNOSIS — F419 Anxiety disorder, unspecified: Secondary | ICD-10-CM | POA: Diagnosis not present

## 2017-09-25 DIAGNOSIS — I5021 Acute systolic (congestive) heart failure: Secondary | ICD-10-CM | POA: Diagnosis present

## 2017-09-25 DIAGNOSIS — I252 Old myocardial infarction: Secondary | ICD-10-CM

## 2017-09-25 DIAGNOSIS — I5023 Acute on chronic systolic (congestive) heart failure: Secondary | ICD-10-CM | POA: Diagnosis present

## 2017-09-25 DIAGNOSIS — R0789 Other chest pain: Secondary | ICD-10-CM | POA: Diagnosis not present

## 2017-09-25 DIAGNOSIS — Z8701 Personal history of pneumonia (recurrent): Secondary | ICD-10-CM

## 2017-09-25 DIAGNOSIS — J45901 Unspecified asthma with (acute) exacerbation: Secondary | ICD-10-CM | POA: Diagnosis not present

## 2017-09-25 DIAGNOSIS — E785 Hyperlipidemia, unspecified: Secondary | ICD-10-CM | POA: Diagnosis not present

## 2017-09-25 DIAGNOSIS — Z825 Family history of asthma and other chronic lower respiratory diseases: Secondary | ICD-10-CM

## 2017-09-25 DIAGNOSIS — I10 Essential (primary) hypertension: Secondary | ICD-10-CM | POA: Diagnosis not present

## 2017-09-25 DIAGNOSIS — R079 Chest pain, unspecified: Secondary | ICD-10-CM | POA: Diagnosis not present

## 2017-09-25 DIAGNOSIS — D649 Anemia, unspecified: Secondary | ICD-10-CM | POA: Diagnosis not present

## 2017-09-25 DIAGNOSIS — R040 Epistaxis: Secondary | ICD-10-CM | POA: Diagnosis not present

## 2017-09-25 DIAGNOSIS — J441 Chronic obstructive pulmonary disease with (acute) exacerbation: Secondary | ICD-10-CM | POA: Diagnosis not present

## 2017-09-25 DIAGNOSIS — I5022 Chronic systolic (congestive) heart failure: Secondary | ICD-10-CM | POA: Diagnosis present

## 2017-09-25 DIAGNOSIS — J4540 Moderate persistent asthma, uncomplicated: Secondary | ICD-10-CM | POA: Diagnosis not present

## 2017-09-25 LAB — COMPREHENSIVE METABOLIC PANEL
ALT: 20 U/L (ref 0–44)
AST: 37 U/L (ref 15–41)
Albumin: 3.2 g/dL — ABNORMAL LOW (ref 3.5–5.0)
Alkaline Phosphatase: 74 U/L (ref 38–126)
Anion gap: 10 (ref 5–15)
BUN: 16 mg/dL (ref 8–23)
CHLORIDE: 105 mmol/L (ref 98–111)
CO2: 20 mmol/L — ABNORMAL LOW (ref 22–32)
Calcium: 8.5 mg/dL — ABNORMAL LOW (ref 8.9–10.3)
Creatinine, Ser: 0.71 mg/dL (ref 0.44–1.00)
Glucose, Bld: 121 mg/dL — ABNORMAL HIGH (ref 70–99)
Potassium: 4.1 mmol/L (ref 3.5–5.1)
Sodium: 135 mmol/L (ref 135–145)
TOTAL PROTEIN: 7.3 g/dL (ref 6.5–8.1)
Total Bilirubin: 1.1 mg/dL (ref 0.3–1.2)

## 2017-09-25 LAB — CBC WITH DIFFERENTIAL/PLATELET
Abs Immature Granulocytes: 0 10*3/uL (ref 0.0–0.1)
BASOS PCT: 1 %
Basophils Absolute: 0 10*3/uL (ref 0.0–0.1)
EOS ABS: 0.1 10*3/uL (ref 0.0–0.7)
Eosinophils Relative: 1 %
HCT: 28.4 % — ABNORMAL LOW (ref 36.0–46.0)
Hemoglobin: 8.4 g/dL — ABNORMAL LOW (ref 12.0–15.0)
IMMATURE GRANULOCYTES: 1 %
Lymphocytes Relative: 23 %
Lymphs Abs: 1.3 10*3/uL (ref 0.7–4.0)
MCH: 22.8 pg — ABNORMAL LOW (ref 26.0–34.0)
MCHC: 29.6 g/dL — AB (ref 30.0–36.0)
MCV: 77 fL — AB (ref 78.0–100.0)
MONOS PCT: 8 %
Monocytes Absolute: 0.5 10*3/uL (ref 0.1–1.0)
NEUTROS PCT: 66 %
Neutro Abs: 3.7 10*3/uL (ref 1.7–7.7)
PLATELETS: 222 10*3/uL (ref 150–400)
RBC: 3.69 MIL/uL — ABNORMAL LOW (ref 3.87–5.11)
RDW: 17.2 % — ABNORMAL HIGH (ref 11.5–15.5)
WBC: 5.6 10*3/uL (ref 4.0–10.5)

## 2017-09-25 LAB — TROPONIN I: Troponin I: <0.03 ng/mL (ref <0.03)

## 2017-09-25 LAB — I-STAT TROPONIN, ED
TROPONIN I, POC: 0 ng/mL (ref 0.00–0.08)
Troponin i, poc: 0 ng/mL (ref 0.00–0.08)

## 2017-09-25 LAB — URINALYSIS, ROUTINE W REFLEX MICROSCOPIC
BACTERIA UA: NONE SEEN
BILIRUBIN URINE: NEGATIVE
Glucose, UA: NEGATIVE mg/dL
Hgb urine dipstick: NEGATIVE
Ketones, ur: NEGATIVE mg/dL
Leukocytes, UA: NEGATIVE
Nitrite: NEGATIVE
PH: 5 (ref 5.0–8.0)
Protein, ur: 30 mg/dL — AB
SPECIFIC GRAVITY, URINE: 1.016 (ref 1.005–1.030)

## 2017-09-25 LAB — IRON AND TIBC
IRON: 31 ug/dL (ref 28–170)
SATURATION RATIOS: 8 % — AB (ref 10.4–31.8)
TIBC: 409 ug/dL (ref 250–450)
UIBC: 378 ug/dL

## 2017-09-25 LAB — D-DIMER, QUANTITATIVE (NOT AT ARMC): D DIMER QUANT: 2.86 ug{FEU}/mL — AB (ref 0.00–0.50)

## 2017-09-25 LAB — POC OCCULT BLOOD, ED: Fecal Occult Bld: NEGATIVE

## 2017-09-25 LAB — GLUCOSE, CAPILLARY
Glucose-Capillary: 155 mg/dL — ABNORMAL HIGH (ref 70–99)
Glucose-Capillary: 134 mg/dL — ABNORMAL HIGH (ref 70–99)

## 2017-09-25 LAB — BRAIN NATRIURETIC PEPTIDE: B NATRIURETIC PEPTIDE 5: 762.4 pg/mL — AB (ref 0.0–100.0)

## 2017-09-25 LAB — FERRITIN: Ferritin: 37 ng/mL (ref 11–307)

## 2017-09-25 LAB — TSH: TSH: 1.407 u[IU]/mL (ref 0.350–4.500)

## 2017-09-25 LAB — MAGNESIUM: MAGNESIUM: 1.8 mg/dL (ref 1.7–2.4)

## 2017-09-25 MED ORDER — SODIUM CHLORIDE 0.9% FLUSH: 3.0000 mL | INTRAVENOUS | Status: DC | PRN

## 2017-09-25 MED ORDER — HEPARIN SODIUM (PORCINE) 5000 UNIT/ML IJ SOLN: 5000.0000 [IU] | Freq: Three times a day (TID) | INTRAMUSCULAR | Status: DC

## 2017-09-25 MED ORDER — ENSURE ENLIVE PO LIQD
237.0000 mL | Freq: Two times a day (BID) | ORAL | Status: DC
  Administered 2017-09-26: 237 mL via ORAL

## 2017-09-25 MED ORDER — SODIUM CHLORIDE 0.9 % IV SOLN: 250.0000 mL | INTRAVENOUS | Status: DC | PRN

## 2017-09-25 MED ORDER — FUROSEMIDE 10 MG/ML IJ SOLN
20.0000 mg | Freq: Once | INTRAMUSCULAR | Status: AC
  Administered 2017-09-25: 20 mg via INTRAVENOUS
  Filled 2017-09-25: qty 2

## 2017-09-25 MED ORDER — ALPRAZOLAM 0.25 MG PO TABS
1.0000 mg | ORAL_TABLET | Freq: Two times a day (BID) | ORAL | Status: DC | PRN
  Administered 2017-09-25 – 2017-09-27 (×3): 1 mg via ORAL
  Filled 2017-09-25 (×4): qty 4

## 2017-09-25 MED ORDER — METFORMIN HCL ER 500 MG PO TB24
500.0000 mg | ORAL_TABLET | Freq: Two times a day (BID) | ORAL | Status: DC
  Administered 2017-09-25 – 2017-09-28 (×6): 500 mg via ORAL
  Filled 2017-09-25 (×8): qty 1

## 2017-09-25 MED ORDER — FAMOTIDINE 20 MG PO TABS
20.0000 mg | ORAL_TABLET | Freq: Two times a day (BID) | ORAL | Status: DC
  Administered 2017-09-26 – 2017-09-28 (×5): 20 mg via ORAL
  Filled 2017-09-25 (×7): qty 1

## 2017-09-25 MED ORDER — SODIUM CHLORIDE 0.9% FLUSH
3.0000 mL | Freq: Two times a day (BID) | INTRAVENOUS | Status: DC
  Administered 2017-09-25 – 2017-09-27 (×5): 3 mL via INTRAVENOUS

## 2017-09-25 MED ORDER — ONDANSETRON HCL 4 MG/2ML IJ SOLN: 4.0000 mg | Freq: Four times a day (QID) | INTRAMUSCULAR | Status: DC | PRN

## 2017-09-25 MED ORDER — ALBUTEROL SULFATE (2.5 MG/3ML) 0.083% IN NEBU: 2.5000 mg | INHALATION_SOLUTION | Freq: Four times a day (QID) | RESPIRATORY_TRACT | Status: DC | PRN

## 2017-09-25 MED ORDER — ACETAMINOPHEN 325 MG PO TABS
650.0000 mg | ORAL_TABLET | ORAL | Status: DC | PRN
  Administered 2017-09-27: 650 mg via ORAL
  Filled 2017-09-25: qty 2

## 2017-09-25 MED ORDER — ZOLPIDEM TARTRATE 5 MG PO TABS
5.0000 mg | ORAL_TABLET | Freq: Every evening | ORAL | Status: DC | PRN
  Administered 2017-09-25 – 2017-09-26 (×2): 5 mg via ORAL
  Filled 2017-09-25 (×2): qty 1

## 2017-09-25 MED ORDER — APIXABAN 5 MG PO TABS
5.0000 mg | ORAL_TABLET | Freq: Every day | ORAL | Status: DC
  Administered 2017-09-25 – 2017-09-26 (×2): 5 mg via ORAL
  Filled 2017-09-25 (×2): qty 1

## 2017-09-25 MED ORDER — FUROSEMIDE 10 MG/ML IJ SOLN
40.0000 mg | Freq: Two times a day (BID) | INTRAMUSCULAR | Status: DC
  Administered 2017-09-25 – 2017-09-27 (×5): 40 mg via INTRAVENOUS
  Filled 2017-09-25 (×5): qty 4

## 2017-09-25 MED ORDER — METOPROLOL SUCCINATE ER 50 MG PO TB24
50.0000 mg | ORAL_TABLET | Freq: Every day | ORAL | Status: DC
  Administered 2017-09-25 – 2017-09-28 (×4): 50 mg via ORAL
  Filled 2017-09-25 (×4): qty 1

## 2017-09-25 MED ORDER — ATORVASTATIN CALCIUM 20 MG PO TABS
80.0000 mg | ORAL_TABLET | Freq: Every day | ORAL | Status: DC
  Administered 2017-09-26 – 2017-09-27 (×2): 80 mg via ORAL
  Filled 2017-09-25 (×3): qty 4

## 2017-09-25 MED ORDER — NITROGLYCERIN 0.4 MG SL SUBL: 0.4000 mg | SUBLINGUAL_TABLET | SUBLINGUAL | Status: DC | PRN

## 2017-09-25 MED ORDER — ASPIRIN EC 81 MG PO TBEC
81.0000 mg | DELAYED_RELEASE_TABLET | Freq: Every day | ORAL | Status: DC
  Administered 2017-09-25 – 2017-09-26 (×2): 81 mg via ORAL
  Filled 2017-09-25 (×3): qty 1

## 2017-09-25 NOTE — Progress Notes (Signed)
IV in left hand flushed and wrapped. Patient is agreeable to keeping it in.

## 2017-09-25 NOTE — ED Notes (Signed)
ED Provider at bedside. 

## 2017-09-25 NOTE — ED Notes (Signed)
Placed purewick on pt, tolerated well. Pt informed of procedure.

## 2017-09-25 NOTE — ED Notes (Signed)
Pharmacy tech at bedside 

## 2017-09-25 NOTE — ED Notes (Signed)
Pt refused IV team stick. T[place], MD notified. Ultrasound supplies at bedside.

## 2017-09-25 NOTE — ED Provider Notes (Signed)
[facility] Provider Note   CSN: [id number] Arrival date & time: 09/25/17  0947     History   Chief Complaint Chief Complaint  Patient presents with  . Shortness of Breath    HPI Sharon Spears is a 72 y.o. female.  The history is provided by the patient and medical records. No language interpreter was used.  Shortness of Breath  This is a new problem. The average episode lasts 2 days. The problem occurs continuously.The current episode started 2 days ago. The problem has been gradually worsening. Associated symptoms include cough, sputum production and leg swelling. Pertinent negatives include no fever, no headaches, no neck pain, no hemoptysis, no wheezing, no chest pain, no syncope, no vomiting, no abdominal pain, no rash and no leg pain. She has tried nothing for the symptoms. The treatment provided no relief. Associated medical issues include asthma, CAD, heart failure and past MI. Associated medical issues do not include DVT.    Past Medical History:  Diagnosis Date  . Asthma   . Coronary artery disease   . Diabetes mellitus   . Heart palpitations   . Hypertension     Patient Active Problem List   Diagnosis Date Noted  . Heart palpitations 06/02/2016  . Acute on chronic respiratory failure with hypoxia ([place]) 03/20/2015  . Asthma exacerbation 03/20/2015  . Laryngitis, acute 03/20/2015  . Thrombocytopenia ([place]) 03/20/2015  . Diabetes mellitus type 2, controlled ([place]) 03/20/2015  . HTN (hypertension) 03/20/2015  . S/P CABG (coronary artery bypass graft) 03/20/2015    Past Surgical History:  Procedure Laterality Date  . CESAREAN SECTION    . CORONARY ARTERY BYPASS GRAFT    . ESOPHAGEAL MANOMETRY N/A 03/06/2012   Procedure: ESOPHAGEAL MANOMETRY (EM);  Surgeon: Beryle Beams, MD;  Location: [facility];  Service: Endoscopy;  Laterality: N/A;  . TONSILLECTOMY       OB History   None      Home Medications    Prior to  Admission medications   Medication Sig Start Date End Date Taking? Authorizing Provider  albuterol (PROVENTIL HFA;VENTOLIN HFA) 108 (90 BASE) MCG/ACT inhaler Inhale 2 puffs into the lungs every 6 (six) hours as needed. For wheezing.    [provider]  albuterol (PROVENTIL) (2.5 MG/3ML) 0.083% nebulizer solution Take 3 mLs (2.5 mg total) by nebulization every 6 (six) hours as needed for wheezing or shortness of breath. 11/21/15   Noe Gens, PA-C  ALPRAZolam Duanne Moron) 1 MG tablet Take 1 mg by mouth 2 (two) times daily as needed. For anxiety.    [provider]  aspirin 81 MG tablet Take 81 mg by mouth daily.    [provider]  B Complex Vitamins (B COMPLEX PO) Take 1 capsule by mouth daily.    [provider]  famotidine (PEPCID) 20 MG tablet Take 20 mg by mouth 2 (two) times daily. 02/04/15   [provider]  fluticasone (FLONASE) 50 MCG/ACT nasal spray Place 2 sprays into both nostrils 2 (two) times daily. 10/25/15   Jacqualine Mau, NP  ibuprofen (ADVIL,MOTRIN) 400 MG tablet Take 1 tablet (400 mg total) by mouth every 6 (six) hours as needed. 06/23/15   Sam, Olivia Canter, PA-C  metFORMIN (GLUCOPHAGE XR) 500 MG 24 hr tablet Take 1 tablet (500 mg total) by mouth daily with breakfast. 06/02/16   Golden Circle, FNP  metoprolol tartrate (LOPRESSOR) 25 MG tablet Take 25 mg by mouth 2 (two) times daily.  04/21/13   [provider]  nitroGLYCERIN (NITROSTAT) 0.4 MG SL tablet Place 0.4 mg under the tongue every 5 (five) minutes as needed. For chest pain.    [provider]  ondansetron (ZOFRAN ODT) 4 MG disintegrating tablet Take 1 tablet (4 mg total) by mouth every 8 (eight) hours as needed for nausea or vomiting. 04/13/17   Ok Edwards, PA-C  Spacer/Aero-Holding Chambers (AEROCHAMBER PLUS WITH MASK) inhaler Use as instructed 11/21/15   Noe Gens, PA-C    Family History Family History  Problem Relation Age of Onset  . Asthma Mother     . Alcohol abuse Father     Social History Social History   Tobacco Use  . Smoking status: Never Smoker  . Smokeless tobacco: Never Used  Substance Use Topics  . Alcohol use: No  . Drug use: No     Allergies   Penicillins   Review of Systems Review of Systems  Constitutional: Negative for chills, diaphoresis, fatigue and fever.  HENT: Negative for congestion.   Eyes: Negative for visual disturbance.  Respiratory: Positive for cough, sputum production, chest tightness and shortness of breath. Negative for hemoptysis, wheezing and stridor.   Cardiovascular: Positive for leg swelling. Negative for chest pain, palpitations and syncope.  Gastrointestinal: Negative for abdominal pain, constipation, diarrhea, nausea and vomiting.  Genitourinary: Negative for dysuria and flank pain.  Musculoskeletal: Negative for back pain, neck pain and neck stiffness.  Skin: Negative for rash and wound.  Neurological: Positive for light-headedness. Negative for dizziness and headaches.  Psychiatric/Behavioral: Negative for agitation and confusion.  All other systems reviewed and are negative.    Physical Exam Updated Vital Signs BP (!) 164/79   Pulse 84   Temp 99.2 F (37.3 C) (Oral)   Resp 20   Wt 62.1 kg   SpO2 95%   BMI 24.27 kg/m   Physical Exam  Constitutional: She is oriented to person, place, and time. She appears well-developed and well-nourished. No distress.  HENT:  Head: Normocephalic and atraumatic.  Nose: Nose normal.  Mouth/Throat: Oropharynx is clear and moist. No oropharyngeal exudate.  Eyes: Pupils are equal, round, and reactive to light. Conjunctivae and EOM are normal.  Neck: Normal range of motion. Neck supple.  Cardiovascular: Normal rate and regular rhythm.  Murmur heard. Pulmonary/Chest: Effort normal. Tachypnea noted. No respiratory distress. She has no wheezes. She has rhonchi. She has rales. She exhibits no tenderness.  Abdominal: Soft. There is no  tenderness. There is no guarding.  Musculoskeletal: She exhibits edema. She exhibits no tenderness.  Neurological: She is alert and oriented to person, place, and time. No sensory deficit. She exhibits normal muscle tone.  Skin: Skin is warm and dry. She is not diaphoretic. No erythema.  Psychiatric: She has a normal mood and affect.  Nursing note and vitals reviewed.    ED Treatments / Results  Labs (all labs ordered are listed, but only abnormal results are displayed) Labs Reviewed  CBC WITH DIFFERENTIAL/PLATELET - Abnormal; Notable for the following components:      Result Value   RBC 3.69 (*)    Hemoglobin 8.4 (*)    HCT 28.4 (*)    MCV 77.0 (*)    MCH 22.8 (*)    MCHC 29.6 (*)    RDW 17.2 (*)    All other components within normal limits  COMPREHENSIVE METABOLIC PANEL - Abnormal; Notable for the following components:   CO2 20 (*)    Glucose, Bld 121 (*)  Calcium 8.5 (*)    Albumin 3.2 (*)    All other components within normal limits  BRAIN NATRIURETIC PEPTIDE - Abnormal; Notable for the following components:   B Natriuretic Peptide 762.4 (*)    All other components within normal limits  URINALYSIS, ROUTINE W REFLEX MICROSCOPIC - Abnormal; Notable for the following components:   Protein, ur 30 (*)    All other components within normal limits  D-DIMER, QUANTITATIVE (NOT AT [facility]) - Abnormal; Notable for the following components:   D-Dimer, Quant 2.86 (*)    All other components within normal limits  GLUCOSE, CAPILLARY - Abnormal; Notable for the following components:   Glucose-Capillary 155 (*)    All other components within normal limits  URINE CULTURE  MAGNESIUM  TROPONIN I  TROPONIN I  TROPONIN I  TSH  IRON AND TIBC  FERRITIN  BASIC METABOLIC PANEL  I-STAT TROPONIN, ED  I-STAT TROPONIN, ED  POC OCCULT BLOOD, ED    EKG EKG Interpretation  Date/Time:  Sunday September 25 2017 09:25:20 EDT Ventricular Rate:  80 PR Interval:    QRS Duration: 98 QT  Interval:  426 QTC Calculation: 492 R Axis:   85 Text Interpretation:  Atrial fibrillation Borderline right axis deviation Consider anterior infarct When compared to prior, new Afib.  No STEMI Confirmed by Antony Blackbird [phone]) on 09/25/2017 9:32:21 AM   Radiology Dg Chest 2 View  Result Date: 09/25/2017 CLINICAL DATA:  Shortness of breath and chest pain EXAM: CHEST - 2 VIEW COMPARISON:  11/10/2015 FINDINGS: Cardiac shadow is enlarged but stable. Left pleural effusion and likely underlying infiltrate/atelectasis is seen. Fullness in the right infrahilar region is noted consistent with right middle lobe infiltrate as well. Mild interstitial edema is seen particularly on the right. Postsurgical changes are again seen. No bony abnormality is noted. IMPRESSION: Bibasilar infiltrative changes left greater than right with associated effusion. Electronically Signed   By: Inez Catalina M.D.   On: 09/25/2017 10:09    Procedures Procedures (including critical care time)  Medications Ordered in ED Medications  sodium chloride flush (NS) 0.9 % injection 3 mL (has no administration in time range)  sodium chloride flush (NS) 0.9 % injection 3 mL (has no administration in time range)  0.9 %  sodium chloride infusion (has no administration in time range)  furosemide (LASIX) injection 40 mg (has no administration in time range)  acetaminophen (TYLENOL) tablet 650 mg (has no administration in time range)  ondansetron (ZOFRAN) injection 4 mg (has no administration in time range)  albuterol (PROVENTIL) (2.5 MG/3ML) 0.083% nebulizer solution 2.5 mg (has no administration in time range)  ALPRAZolam (XANAX) tablet 1 mg (has no administration in time range)  aspirin EC tablet 81 mg (has no administration in time range)  atorvastatin (LIPITOR) tablet 80 mg (has no administration in time range)  apixaban (ELIQUIS) tablet 5 mg (has no administration in time range)  famotidine (PEPCID) tablet 20 mg (has no  administration in time range)  metFORMIN (GLUCOPHAGE-XR) 24 hr tablet 500 mg (has no administration in time range)  metoprolol succinate (TOPROL-XL) 24 hr tablet 50 mg (has no administration in time range)  nitroGLYCERIN (NITROSTAT) SL tablet 0.4 mg (has no administration in time range)  zolpidem (AMBIEN) tablet 5 mg (has no administration in time range)  furosemide (LASIX) injection 20 mg (20 mg Intravenous Given 09/25/17 1151)     Initial Impression / Assessment and Plan / ED Course  I have reviewed the triage vital signs and the  nursing notes.  Pertinent labs & imaging results that were available during my care of the patient were reviewed by me and considered in my medical decision making (see chart for details).     Sharon Spears is a 72 y.o. female with a past medical history significant for heart failure, diabetes, hypertension, CAD status post CABG, and asthma who presents with bilateral peripheral edema, and shortness of breath.  Patient reports that she has had shortness of breath for the last 2 days as well as worsening edema in her legs.  She says that she ran out of her diuretic, Lasix yesterday.  She reports she was taking an over-the-counter version that does not seem to help.  She denies any chest pain but does report that she has had some exertional symptoms of shortness of breath worsening.  She reports some mild coughing no congestion.  No fevers or chills.  She reports decreased urination but no dysuria.  She says that she takes Eliquis but has not taken it in the last few days.  She thinks she takes this for arrhythmias.  This did not appear to be seen in her chart.  On exam, patient mild lower extreme edema.  Lungs were coarse and had crackles in the bases.  Chest was nontender.  Murmur was appreciated.  Surgical scar was in place.  Abdomen was nontender.  Patient had symmetric radial pulses.  Given patient's exertional shortness of breath, crackles in lungs, and leg swollen  and reported decreased Lasix, I am concerned about a CHF exacerbation.  Patient will have screening laboratory testing and imaging to further evaluate.  EKG shows atrial fibrillation.  Patient reports he is on Eliquis and I suspect this is for the A. fib however it does not appear to be in her chart.  Possible new atrial fibrillation.  Patient's chest x-ray shows evidence of possible pleural effusions.  BNP elevated.  Clinically I suspect CHF exacerbation.  Patient also found to have decreased hemoglobin down to 8.4 down from 15 previously.  D-dimer was elevated in the setting of the exertional shortness of breath, leg swelling.   Patient reports that she had an allergy to contrast years ago.  Will defer further lung imaging to admitting team.  Patient admitted for further management of likely CHF exacerbation and fluid overload.  Patient admitted in stable condition.  Final Clinical Impressions(s) / ED Diagnoses   Final diagnoses:  Exertional shortness of breath  Atrial fibrillation, unspecified type (HCC)  Anemia, unspecified type  Edema, unspecified type    ED Discharge Orders    None      Clinical Impression: 1. Exertional shortness of breath   2. Atrial fibrillation, unspecified type ([place])   3. Anemia, unspecified type   4. Edema, unspecified type     Disposition: Admit  This note was prepared with assistance of Dragon voice recognition software. Occasional wrong-word or sound-a-like substitutions may have occurred due to the inherent limitations of voice recognition software.     Tegeler, Gwenyth Allegra, MD 09/25/17 [phone]

## 2017-09-25 NOTE — ED Triage Notes (Addendum)
Pt reports misplacing her lasix 1 day ago, began taking otc diuretic, pt c/o sob that began yesterday. Pt had recent open heart surgery 3 months ago and states she has some residual chest soreness from that. Pt took 345m asa and 1 sl nitro with EMS. Pt a/ox4, edema present to Bilateral LEs. Supposed to be on eliquis but has not taken it today due to her stomach feeling upset

## 2017-09-25 NOTE — H&P (Signed)
Referring Physician:  [name] is an 72 y.o. female.                       Chief Complaint: Shortness of breath and chest pain  HPI: 72 year old female with recent history of pneumonia, 3-4 months ago in [place], has shortness of breath and chest pain with cough for few days. She also has mild bilateral leg edema. She has PMH of CAD, CABG, type 2 DM, hypertension and asthma. Her chest x-ray is positive for bibasilar infiltrative changes left mor than right lung with mild effusion and cardiomegaly. Her BNP is elevated at 762.4 pg. Her EKG shows chronic atrial fibrillation, rate controlled, with CHA2DS2VASc of 5 and on Eliquis but now has developed significant anemia as Hgb has dropped from 15.6 to 8.4 g/dL in 5 months. Patient denies any noticing any dark stools or GI bleed.  Past Medical History:  Diagnosis Date  . Asthma   . Coronary artery disease   . Diabetes mellitus   . Heart palpitations   . Hypertension       Past Surgical History:  Procedure Laterality Date  . CESAREAN SECTION    . CORONARY ARTERY BYPASS GRAFT    . ESOPHAGEAL MANOMETRY N/A 03/06/2012   Procedure: ESOPHAGEAL MANOMETRY (EM);  Surgeon: Beryle Beams, MD;  Location: [facility];  Service: Endoscopy;  Laterality: N/A;  . TONSILLECTOMY      Family History  Problem Relation Age of Onset  . Asthma Mother   . Alcohol abuse Father    Social History:  reports that she has never smoked. She has never used smokeless tobacco. She reports that she does not drink alcohol or use drugs.  Allergies:  Allergies  Allergen Reactions  . Penicillins Other (See Comments)    GI upset Has patient had a PCN reaction causing immediate rash, facial/tongue/throat swelling, SOB or lightheadedness with hypotension: No Has patient had a PCN reaction causing severe rash involving mucus membranes or skin necrosis: No Has patient had a PCN reaction that required hospitalization: No Has patient had a PCN reaction occurring within the  last 10 years: No If all of the above answers are "NO", then may proceed with Cephalosporin use.     (Not in a hospital admission)  Results for orders placed or performed during the hospital encounter of 09/25/17 (from the past 48 hour(s))  CBC with Differential     Status: Abnormal   Collection Time: 09/25/17  9:54 AM  Result Value Ref Range   WBC 5.6 4.0 - 10.5 K/uL   RBC 3.69 (L) 3.87 - 5.11 MIL/uL   Hemoglobin 8.4 (L) 12.0 - 15.0 g/dL   HCT 28.4 (L) 36.0 - 46.0 %   MCV 77.0 (L) 78.0 - 100.0 fL   MCH 22.8 (L) 26.0 - 34.0 pg   MCHC 29.6 (L) 30.0 - 36.0 g/dL   RDW 17.2 (H) 11.5 - 15.5 %   Platelets 222 150 - 400 K/uL   Neutrophils Relative % 66 %   Neutro Abs 3.7 1.7 - 7.7 K/uL   Lymphocytes Relative 23 %   Lymphs Abs 1.3 0.7 - 4.0 K/uL   Monocytes Relative 8 %   Monocytes Absolute 0.5 0.1 - 1.0 K/uL   Eosinophils Relative 1 %   Eosinophils Absolute 0.1 0.0 - 0.7 K/uL   Basophils Relative 1 %   Basophils Absolute 0.0 0.0 - 0.1 K/uL   Immature Granulocytes 1 %   Abs Immature  Granulocytes 0.0 0.0 - 0.1 K/uL    Comment: Performed at [facility], [place] [address], [place], [place] [address]  Comprehensive metabolic panel     Status: Abnormal   Collection Time: 09/25/17  9:54 AM  Result Value Ref Range   Sodium 135 135 - 145 mmol/L   Potassium 4.1 3.5 - 5.1 mmol/L   Chloride 105 98 - 111 mmol/L   CO2 20 (L) 22 - 32 mmol/L   Glucose, Bld 121 (H) 70 - 99 mg/dL   BUN 16 8 - 23 mg/dL   Creatinine, Ser 0.71 0.44 - 1.00 mg/dL   Calcium 8.5 (L) 8.9 - 10.3 mg/dL   Total Protein 7.3 6.5 - 8.1 g/dL   Albumin 3.2 (L) 3.5 - 5.0 g/dL   AST 37 15 - 41 U/L   ALT 20 0 - 44 U/L   Alkaline Phosphatase 74 38 - 126 U/L   Total Bilirubin 1.1 0.3 - 1.2 mg/dL   GFR calc non Af Amer >60 >60 mL/min   GFR calc Af Amer >60 >60 mL/min    Comment: (NOTE) The eGFR has been calculated using the CKD EPI equation. This calculation has not been validated in all clinical situations. eGFR's  persistently <60 mL/min signify possible Chronic Kidney Disease.    Anion gap 10 5 - 15    Comment: Performed at [facility] [address] [place], [place] [address]  Brain natriuretic peptide     Status: Abnormal   Collection Time: 09/25/17  9:54 AM  Result Value Ref Range   B Natriuretic Peptide 762.4 (H) 0.0 - 100.0 pg/mL    Comment: Performed at [facility] [address], [place], [place] [address]  Magnesium     Status: None   Collection Time: 09/25/17  9:54 AM  Result Value Ref Range   Magnesium 1.8 1.7 - 2.4 mg/dL    Comment: Performed at [facility] [address] [place], [place] [address]  D-dimer, quantitative (not at [facility])     Status: Abnormal   Collection Time: 09/25/17  9:54 AM  Result Value Ref Range   D-Dimer, Quant 2.86 (H) 0.00 - 0.50 ug/mL-FEU    Comment: (NOTE) At the manufacturer cut-off of 0.50 ug/mL FEU, this assay has been documented to exclude PE with a sensitivity and negative predictive value of 97 to 99%.  At this time, this assay has not been approved by the FDA to exclude DVT/VTE. Results should be correlated with clinical presentation. Performed at [facility], [place] [address], [place], [place] [address]   I-stat troponin, ED     Status: None   Collection Time: 09/25/17  9:58 AM  Result Value Ref Range   Troponin i, poc 0.00 0.00 - 0.08 ng/mL   Comment 3            Comment: Due to the release kinetics of cTnI, a negative result within the first hours of the onset of symptoms does not rule out myocardial infarction with certainty. If myocardial infarction is still suspected, repeat the test at appropriate intervals.   Urinalysis, Routine w reflex microscopic     Status: Abnormal   Collection Time: 09/25/17 12:02 PM  Result Value Ref Range   Color, Urine YELLOW YELLOW   APPearance CLEAR CLEAR   Specific Gravity, Urine 1.016 1.005 - 1.030   pH 5.0 5.0 - 8.0   Glucose, UA NEGATIVE NEGATIVE mg/dL   Hgb urine  dipstick NEGATIVE NEGATIVE   Bilirubin Urine NEGATIVE NEGATIVE   Ketones, ur NEGATIVE NEGATIVE mg/dL   Protein, ur 30 (A) NEGATIVE mg/dL   Nitrite NEGATIVE NEGATIVE   Leukocytes, UA NEGATIVE NEGATIVE   RBC / HPF 0-5 0 - 5 RBC/hpf   WBC, UA 0-5 0 - 5 WBC/hpf   Bacteria, UA NONE SEEN NONE SEEN   Squamous Epithelial / LPF 0-5 0 - 5   Mucus PRESENT     Comment: Performed at [facility], [place] [address], [place], [place] [address]  I-stat troponin, ED     Status: None   Collection Time: 09/25/17 12:48 PM  Result Value Ref Range   Troponin i, poc 0.00 0.00 - 0.08 ng/mL   Comment 3            Comment: Due to the release kinetics of cTnI, a negative result within the first hours of the onset of symptoms does not rule out myocardial infarction with certainty. If myocardial infarction is still suspected, repeat the test at appropriate intervals.   POC occult blood, ED     Status: None   Collection Time: 09/25/17  1:42 PM  Result Value Ref Range   Fecal Occult Bld NEGATIVE NEGATIVE   Dg Chest 2 View  Result Date: 09/25/2017 CLINICAL DATA:  Shortness of breath and chest pain EXAM: CHEST - 2 VIEW COMPARISON:  11/10/2015 FINDINGS: Cardiac shadow is enlarged but stable. Left pleural effusion and likely underlying infiltrate/atelectasis is seen. Fullness in the right infrahilar region is noted consistent with right middle lobe infiltrate as well. Mild interstitial edema is seen particularly on the right. Postsurgical changes are again seen. No bony abnormality is noted. IMPRESSION: Bibasilar infiltrative changes left greater than right with associated effusion. Electronically Signed   By: Inez Catalina M.D.   On: 09/25/2017 10:09    Review Of Systems Constitutional: H/O fever, chills, weight loss. No weight gain. Eyes: No vision change, wears glasses. No discharge or pain. Ears: No hearing loss, No tinnitus. Respiratory: Positive asthma, no COPD, positive pneumonias, shortness of breath.  No hemoptysis. Cardiovascular: Occasional chest pain, palpitation, leg edema. Gastrointestinal: No nausea, vomiting, diarrhea, constipation. No GI bleed. No hepatitis. Genitourinary: No dysuria, hematuria, kidney stone. No incontinance. Neurological: No headache, stroke, seizures.  Psychiatry: No psych facility admission for anxiety, depression, suicide. No detox. Skin: No rash. Musculoskeletal: Positive joint pain, fibromyalgia. No neck pain, back pain. Lymphadenopathy: No lymphadenopathy. Hematology: Positive anemia.No easy bruising.   Blood pressure 112/69, pulse 89, resp. rate 19, SpO2 99 %. There is no height or weight on file to calculate BMI. General appearance: alert, cooperative, appears stated age and no distress Head: Normocephalic, atraumatic. Eyes: Brown eyes, pale conjunctiva, corneas clear. PERRL, EOM's intact. Neck: No adenopathy, no carotid bruit, + JVD, supple, symmetrical, trachea midline and thyroid not enlarged. Resp: Crackles at bases to auscultation bilaterally. Cardio: Regular rate and rhythm, S1, S2 normal, II/VI systolic murmur, no click, rub or gallop GI: Soft, non-tender; bowel sounds normal; no organomegaly. Extremities: 1 + edema, no cyanosis or clubbing. Skin: Warm and dry.  Neurologic: Alert and oriented X 3, normal strength. Normal coordination and slow gait.  Assessment/Plan Acute systolic left heart failure Asthma exacerbation CAD CABG Hypertension Type 2 DM Anemia, r/o iron deficiency and GI blood loss  Diuresis as tolerated. Echocardiogram. Serum iron, TSH, Troponin-I, Stool for occult blood. Hold Eliquis for now.  Birdie Riddle, MD  09/25/2017, 3:22 PM

## 2017-09-25 NOTE — ED Notes (Signed)
Pt stating she does not want to be poked anymore "for the fun of it." RN educated pt on importance of IV for CT angio. IV team at bedside.

## 2017-09-25 NOTE — Progress Notes (Signed)
Called Dr Zenia Resides office to notify patient pulled out IV inserted by IV team. States she doesn't think it was placed properly and wanted it out. Patient refuses to have IV reinserted. Awaiting callback from Dr Terrence Dupont. Will continue to monitor patient.

## 2017-09-25 NOTE — Progress Notes (Signed)
Page returned by [place]. RN  Informed him of patient's request. States if patient refuses access will have to switch IV lasix to Po. Rn will attempt to talk with patient again to see if she will allow reinsertion of IV. Will continue to monitor patient.

## 2017-09-26 ENCOUNTER — Inpatient Hospital Stay (HOSPITAL_COMMUNITY): Payer: Medicare Other

## 2017-09-26 LAB — CBC
HEMATOCRIT: 30.2 % — AB (ref 36.0–46.0)
Hemoglobin: 9.1 g/dL — ABNORMAL LOW (ref 12.0–15.0)
MCH: 22.7 pg — AB (ref 26.0–34.0)
MCHC: 30.1 g/dL (ref 30.0–36.0)
MCV: 75.3 fL — AB (ref 78.0–100.0)
Platelets: 280 10*3/uL (ref 150–400)
RBC: 4.01 MIL/uL (ref 3.87–5.11)
RDW: 17.5 % — AB (ref 11.5–15.5)
WBC: 6.3 10*3/uL (ref 4.0–10.5)

## 2017-09-26 LAB — BASIC METABOLIC PANEL
Anion gap: 9 (ref 5–15)
Anion gap: 12 (ref 5–15)
BUN: 16 mg/dL (ref 8–23)
BUN: 18 mg/dL (ref 8–23)
CALCIUM: 8.8 mg/dL — AB (ref 8.9–10.3)
CO2: 26 mmol/L (ref 22–32)
CO2: 28 mmol/L (ref 22–32)
CREATININE: 0.81 mg/dL (ref 0.44–1.00)
CREATININE: 0.92 mg/dL (ref 0.44–1.00)
Calcium: 8.7 mg/dL — ABNORMAL LOW (ref 8.9–10.3)
Chloride: 103 mmol/L (ref 98–111)
Chloride: 99 mmol/L (ref 98–111)
GFR calc Af Amer: >60 mL/min (ref >60)
GFR calc Af Amer: >60 mL/min (ref >60)
GFR calc non Af Amer: >60 mL/min (ref >60)
GLUCOSE: 102 mg/dL — AB (ref 70–99)
GLUCOSE: 125 mg/dL — AB (ref 70–99)
Potassium: 3.4 mmol/L — ABNORMAL LOW (ref 3.5–5.1)
Potassium: 3.5 mmol/L (ref 3.5–5.1)
SODIUM: 138 mmol/L (ref 135–145)
Sodium: 139 mmol/L (ref 135–145)

## 2017-09-26 LAB — ECHOCARDIOGRAM COMPLETE
HEIGHTINCHES: 62 in
WEIGHTICAEL: 2140.8 [oz_av]

## 2017-09-26 LAB — GLUCOSE, CAPILLARY
GLUCOSE-CAPILLARY: 119 mg/dL — AB (ref 70–99)
Glucose-Capillary: 149 mg/dL — ABNORMAL HIGH (ref 70–99)

## 2017-09-26 LAB — URINE CULTURE: Culture: <10000 — AB

## 2017-09-26 LAB — TROPONIN I: Troponin I: <0.03 ng/mL (ref <0.03)

## 2017-09-26 MED ORDER — ADULT MULTIVITAMIN W/MINERALS CH
1.0000 | ORAL_TABLET | Freq: Every day | ORAL | Status: DC
  Administered 2017-09-27: 1 via ORAL
  Filled 2017-09-26: qty 1

## 2017-09-26 MED ORDER — FERROUS SULFATE 325 (65 FE) MG PO TABS
325.0000 mg | ORAL_TABLET | Freq: Three times a day (TID) | ORAL | Status: DC
  Administered 2017-09-26 – 2017-09-27 (×4): 325 mg via ORAL
  Filled 2017-09-26 (×4): qty 1

## 2017-09-26 MED ORDER — HEPARIN (PORCINE) IN NACL 100-0.45 UNIT/ML-% IJ SOLN: 850.0000 [IU]/h | INTRAMUSCULAR | Status: DC

## 2017-09-26 MED ORDER — PRO-STAT SUGAR FREE PO LIQD
30.0000 mL | Freq: Two times a day (BID) | ORAL | Status: DC
  Administered 2017-09-26 – 2017-09-27 (×2): 30 mL via ORAL
  Filled 2017-09-26 (×3): qty 30

## 2017-09-26 NOTE — Progress Notes (Signed)
Subjective:  Patient denies any chest pain states breathing is improved states had recently mitral valve replacement in [place] approximately 3 months ago records not available. States her and bilateral pneumonia approximately 3 months ago subsequently had valve infection requiring prolonged antibiotics and replacement of the valve. Patient denies any cough fever or chills. Denies any anginal chest pain. Patient denies any black tarry stools. Or bright red blood per rectum.  Objective:  Vital Signs in the last 24 hours: Temp:  [98.4 F (36.9 C)-99.2 F (37.3 C)] 98.6 F (37 C) (09/16 1128) Pulse Rate:  [62-89] 71 (09/16 1128) Resp:  [17-26] 19 (09/16 1128) BP: (112-172)/(43-95) 117/43 (09/16 1128) SpO2:  [84 %-100 %] 95 % (09/16 1128) Weight:  [60.7 kg-62.1 kg] 60.7 kg (09/16 0447)  Intake/Output from previous day: 09/15 0701 - 09/16 0700 In: 458 [P.O.:750; I.V.:6] Out: 4125 [Urine:4125] Intake/Output from this shift: Total I/O In: 120 [P.O.:120] Out: 600 [Urine:600]  Physical Exam: Neck: no adenopathy, no carotid bruit, no JVD and supple, symmetrical, trachea midline Lungs: decreased breath sound at bases with faint rales Heart: irregularly irregular rhythm, S1, S2 normal and soft systolic murmur noted Abdomen: soft, non-tender; bowel sounds normal; no masses,  no organomegaly Extremities: no clubbing cyanosis trace edema noted  Lab Results: Recent Labs    09/25/17 0954  WBC 5.6  HGB 8.4*  PLT 222   Recent Labs    09/25/17 0954 09/26/17 0350  NA 135 138  K 4.1 3.4*  CL 105 103  CO2 20* 26  GLUCOSE 121* 102*  BUN 16 16  CREATININE 0.71 0.81   Recent Labs    09/25/17 2153 09/26/17 0350  TROPONINI <0.03 <0.03   Hepatic Function Panel Recent Labs    09/25/17 0954  PROT 7.3  ALBUMIN 3.2*  AST 37  ALT 20  ALKPHOS 74  BILITOT 1.1   No results for input(s): CHOL in the last 72 hours. No results for input(s): PROTIME in the last 72  hours.  Imaging: Imaging results have been reviewed and Dg Chest 2 View  Result Date: 09/25/2017 CLINICAL DATA:  Shortness of breath and chest pain EXAM: CHEST - 2 VIEW COMPARISON:  11/10/2015 FINDINGS: Cardiac shadow is enlarged but stable. Left pleural effusion and likely underlying infiltrate/atelectasis is seen. Fullness in the right infrahilar region is noted consistent with right middle lobe infiltrate as well. Mild interstitial edema is seen particularly on the right. Postsurgical changes are again seen. No bony abnormality is noted. IMPRESSION: Bibasilar infiltrative changes left greater than right with associated effusion. Electronically Signed   By: Inez Catalina M.D.   On: 09/25/2017 10:09    Cardiac Studies:  Assessment/Plan:  Resolving acute systolic congestive heart failure Status post questionable mitral valve endocarditis status post MVR Chronic atrial fibrillation Possible postop anemia rule out GI loss Hypertension Diabetes mellitus Hyperlipidemia Anxiety disorder Bronchial asthma Plan Check labs Check old records from Hospital in [place] stool focal blood Start Feosol Start heparin per pharmacy protocol Monitor CBC  LOS: 1 day    Charolette Forward 09/26/2017, 11:54 AM

## 2017-09-26 NOTE — Progress Notes (Signed)
Patient refusing bedtime CBG. States she does not want to be poked. Patient alert and oriented. No other complaints at this time. Will continue to monitor patient.

## 2017-09-26 NOTE — Progress Notes (Signed)
Initial Nutrition Assessment  DOCUMENTATION CODES:   Not applicable  INTERVENTION:   -D/c Ensure Enlive po BID, each supplement provides 350 kcal and 20 grams of protein -30 ml Prostat BID, each supplement provides 100 kcals and 15 grams protein -MVI with minerals daily  NUTRITION DIAGNOSIS:   Inadequate oral intake related to decreased appetite as evidenced by meal completion < 50%, per patient/family report.  GOAL:   Patient will meet greater than or equal to 90% of their needs  MONITOR:   PO intake, Supplement acceptance, Labs, Weight trends, Skin, I & O's  REASON FOR ASSESSMENT:   Malnutrition Screening Tool    ASSESSMENT:   72 year old female with recent history of pneumonia, 3-4 months ago in [place], has shortness of breath and chest pain with cough for few days  Pt admitted with heart failure and asthma exacerbation.  Spoke with pt at bedside, who reports a decreased appetite over the past 8 weeks. She shares that when she was started in Eliquis, she began experiencing taste changes; she describes that all foods left a bitter taste in her mouth and only consumed about 25% of her meal. She was consuming a large amount of fluids due to poor solid food intake. Meal completion 25-100%, however, pt reports only consuming one boiled egg and a piece of toast for breakfast this AM.   Pt endorses "a lot" of weight loss during time period due to poor oral intake and diuresis. Noted wt hx reveals a 15/7% wt loss over the past 3 months. Wt hx difficult to assess due to fluid changes.   Discussed with pt importance of good meal and supplement intake to promote healing. Pt aware of fluid restriction. Assisted pt with setting up lunch meal and provided encouragement to eat.   Last Hgb A1c: 7.8 (06/02/16). PTA DM medications are none.   Labs reviewed: K: 3.4, CBGS: 119-134 (inpatient orders for glycemic control are 500 mg metformin BID).   NUTRITION - FOCUSED PHYSICAL EXAM:    Most  Recent Value  Orbital Region  No depletion  Upper Arm Region  Mild depletion  Thoracic and Lumbar Region  No depletion  Buccal Region  No depletion  Temple Region  No depletion  Clavicle Bone Region  No depletion  Clavicle and Acromion Bone Region  No depletion  Scapular Bone Region  No depletion  Dorsal Hand  No depletion  Patellar Region  Mild depletion  Anterior Thigh Region  Mild depletion  Posterior Calf Region  Mild depletion  Edema (RD Assessment)  Mild  Hair  Reviewed  Eyes  Reviewed  Mouth  Reviewed  Skin  Reviewed  Nails  Reviewed       Diet Order:   Diet Order            Diet Heart Room service appropriate? Yes; Fluid consistency: Thin; Fluid restriction: 1500 mL Fluid  Diet effective now              EDUCATION NEEDS:   Education needs have been addressed  Skin:  Skin Assessment: Reviewed RN Assessment  Last BM:  09/25/17  Height:   Ht Readings from Last 1 Encounters:  09/25/17 _0  (1.575 m)    Weight:   Wt Readings from Last 1 Encounters:  09/26/17 60.7 kg    Ideal Body Weight:  50 kg  BMI:  Body mass index is 24.47 kg/m.  Estimated Nutritional Needs:   Kcal:  1650-1850  Protein:  80-95 grams  Fluid:  1.6-1.8 L    Icyss Skog A. Jimmye Norman, RD, LDN, CDE Pager: [phone] After hours Pager: [phone]

## 2017-09-26 NOTE — Progress Notes (Signed)
  Echocardiogram 2D Echocardiogram has been performed.  Sharon Spears 09/26/2017, 12:35 PM

## 2017-09-26 NOTE — Progress Notes (Signed)
ANTICOAGULATION CONSULT NOTE - Initial Consult  Pharmacy Consult for Heparin Indication: atrial fibrillation  Allergies  Allergen Reactions  . Penicillins Other (See Comments)    GI upset Has patient had a PCN reaction causing immediate rash, facial/tongue/throat swelling, SOB or lightheadedness with hypotension: No Has patient had a PCN reaction causing severe rash involving mucus membranes or skin necrosis: No Has patient had a PCN reaction that required hospitalization: No Has patient had a PCN reaction occurring within the last 10 years: No If all of the above answers are "NO", then may proceed with Cephalosporin use.    Patient Measurements: Height: _0  (157.5 cm) Weight: 133 lb 12.8 oz (60.7 kg) IBW/kg (Calculated) : 50.1 Heparin Dosing Weight: 60.7 kg  Vital Signs: Temp: 98.6 F (37 C) (09/16 1128) Temp Source: Oral (09/16 1128) BP: 117/43 (09/16 1128) Pulse Rate: 71 (09/16 1128)  Labs: Recent Labs    09/25/17 0954 09/25/17 1627 09/25/17 2153 09/26/17 0350  HGB 8.4*  --   --   --   HCT 28.4*  --   --   --   PLT 222  --   --   --   CREATININE 0.71  --   --  0.81  TROPONINI  --  <0.03 <0.03 <0.03    Estimated Creatinine Clearance: 54.6 mL/min (by C-G formula based on SCr of 0.81 mg/dL).   Medical History: Past Medical History:  Diagnosis Date  . Asthma   . Coronary artery disease   . Diabetes mellitus   . Heart palpitations   . Hypertension    Assessment: *CC/HPI: shortness of breath and chest pain with cough for few days, B leg edema  PMH: PNA 3-4 months ago.CAD, CABG, type 2 DM, hypertension and asthma. Afib,  Significant events: Hgb has dropped from 15.6 to 8.4 g/dL in 5 months Recent mitral valve replacement in [place] approximately 3 months ago records not available.States had  bilateral pneumonia approximately 3 months ago subsequently had valve infection requiring prolonged antibiotics and replacement of the valve.  Anticoag: Afib,  CHA2DS2VASc of 5. Hgb down to 8.4. Eliquis dosing is once daily?? (LD 9/16 AM). Start IV heparin this PM with no bolus while r/o GI cause of anemia  Goal of Therapy:  Heparin level 0.3-0.7 units/ml Monitor platelets by anticoagulation protocol: Yes   Plan:  This PM 9/16, start IV heparin (no bolus) at 850 units/hr Will check HL and AM daily   Sharon Spears, PharmD, [place] Clinical Staff Pharmacist [phone]  Sharon Spears 09/26/2017,12:30 PM

## 2017-09-26 NOTE — Progress Notes (Signed)
Patient refused evening CBG. Education given and still declines.

## 2017-09-26 NOTE — Progress Notes (Signed)
Received call from RN that patient was experiencing bad nosebleed (received Eliquis this AM) and was instructed by Dr. Terrence Dupont to hold heparin tonight. Orders have been d/c'd for now. Will f/u with Dr. Terrence Dupont tomorrow AM..  Dorwin Fitzhenry S. Alford Highland, PharmD, [place] Clinical Staff Pharmacist [phone]

## 2017-09-27 LAB — BASIC METABOLIC PANEL
Anion gap: 14 (ref 5–15)
BUN: 26 mg/dL — AB (ref 8–23)
CO2: 24 mmol/L (ref 22–32)
CREATININE: 0.99 mg/dL (ref 0.44–1.00)
Calcium: 8.7 mg/dL — ABNORMAL LOW (ref 8.9–10.3)
Chloride: 99 mmol/L (ref 98–111)
GFR calc non Af Amer: 56 mL/min — ABNORMAL LOW (ref >60)
Glucose, Bld: 184 mg/dL — ABNORMAL HIGH (ref 70–99)
Potassium: 3.4 mmol/L — ABNORMAL LOW (ref 3.5–5.1)
Sodium: 137 mmol/L (ref 135–145)

## 2017-09-27 LAB — GLUCOSE, CAPILLARY
Glucose-Capillary: 183 mg/dL — ABNORMAL HIGH (ref 70–99)
Glucose-Capillary: 145 mg/dL — ABNORMAL HIGH (ref 70–99)

## 2017-09-27 MED ORDER — ENOXAPARIN SODIUM 60 MG/0.6ML ~~LOC~~ SOLN: 60.0000 mg | Freq: Two times a day (BID) | SUBCUTANEOUS | Status: DC

## 2017-09-27 MED ORDER — FUROSEMIDE 10 MG/ML IJ SOLN: 40.0000 mg | Freq: Two times a day (BID) | INTRAMUSCULAR | Status: DC

## 2017-09-27 MED ORDER — ENOXAPARIN SODIUM 60 MG/0.6ML ~~LOC~~ SOLN
60.0000 mg | Freq: Two times a day (BID) | SUBCUTANEOUS | Status: DC
  Administered 2017-09-27: 60 mg via SUBCUTANEOUS
  Filled 2017-09-27: qty 0.6

## 2017-09-27 MED ORDER — FERROUS SULFATE 325 (65 FE) MG PO TABS
325.0000 mg | ORAL_TABLET | Freq: Three times a day (TID) | ORAL | Status: DC
  Administered 2017-09-28: 325 mg via ORAL
  Filled 2017-09-27 (×3): qty 1

## 2017-09-27 NOTE — Progress Notes (Signed)
Fax has been sent for records from hospital in [place] per order. Have not received records yet.

## 2017-09-27 NOTE — Progress Notes (Signed)
Responded to spiritual care consult to provide support to patient who had requested prayer. Patient was sitting on side of bed and was very frustrated and concerned about her medicare insurance coverage. She said that someone in [place] had her sign paper that was blanket and she didn't know what she was signing but they said she had too. She would like someoneto explain to her regarding her insurance coverage and other matters.  I referred her concern to Judson Roch ( Social Work), Provided prayer, empathetic listening, emotional and spiritual support.. Will follow as needed.  Jaclynn Major, [place], [facility], Pager [phone]

## 2017-09-27 NOTE — Progress Notes (Signed)
Patient removed tele monitor. Refusing to have it placed back. States she want to be left alone. CCMD notified dayshift RN aware.

## 2017-09-27 NOTE — Progress Notes (Signed)
Patient declines CBG check.

## 2017-09-27 NOTE — Progress Notes (Signed)
Patient refused AM labs. States she wants to talk to Dr Terrence Dupont. Patient upset weight is down. Rn explained to patient it is due to his fluid loss. Patient is alert and oriented request not to be disturbed for the rest of the morning. Will continue t monitor patient.

## 2017-09-27 NOTE — Progress Notes (Signed)
[facility] in [place] contacted regarding records. Nursing secretary faxed release form again. Fax has been received from hospital in [place] stating release form has been received. Now we are waiting for health records.

## 2017-09-27 NOTE — Progress Notes (Signed)
Subjective:  Denies any chest pain or shortness of breath.  Records from [place], still not available.  Hemoglobin has been stable. Patient refusing for lab work.  Heparin was held last night due to epistaxis.  No further episodes of epistaxis.  Heparin was switched to Lovenox.  Objective:  Vital Signs in the last 24 hours: Temp:  [98.2 F (36.8 C)-98.4 F (36.9 C)] 98.4 F (36.9 C) (09/17 0428) Pulse Rate:  [67-79] 68 (09/17 1229) Resp:  [18] 18 (09/17 0428) BP: (129-130)/(53-62) 129/62 (09/17 1229) SpO2:  [98 %] 98 % (09/17 0428) Weight:  [59.5 kg] 59.5 kg (09/17 0500)  Intake/Output from previous day: 09/16 0701 - 09/17 0700 In: 600 [P.O.:600] Out: 1000 [Urine:1000] Intake/Output from this shift: No intake/output data recorded.  Physical Exam: Neck: no adenopathy, no carotid bruit, no JVD and supple, symmetrical, trachea midline Lungs: clear to auscultation bilaterally Heart: irregularly irregular rhythm, S1, S2 normal and soft systolic murmur noted Abdomen: soft, non-tender; bowel sounds normal; no masses,  no organomegaly Extremities: extremities normal, atraumatic, no cyanosis or edema  Lab Results: Recent Labs    09/25/17 0954 09/26/17 1320  WBC 5.6 6.3  HGB 8.4* 9.1*  PLT 222 280   Recent Labs    09/26/17 1320 09/27/17 0954  NA 139 137  K 3.5 3.4*  CL 99 99  CO2 28 24  GLUCOSE 125* 184*  BUN 18 26*  CREATININE 0.92 0.99   Recent Labs    09/25/17 2153 09/26/17 0350  TROPONINI <0.03 <0.03   Hepatic Function Panel Recent Labs    09/25/17 0954  PROT 7.3  ALBUMIN 3.2*  AST 37  ALT 20  ALKPHOS 74  BILITOT 1.1   No results for input(s): CHOL in the last 72 hours. No results for input(s): PROTIME in the last 72 hours.  Imaging: Imaging results have been reviewed and No results found.  Cardiac Studies:  Assessment/Plan:  Resolving acute systolic congestive heart failure Status post questionable mitral valve endocarditis status post  MVR Chronic atrial fibrillation  Chadsvasc Score of 4 Possible postop anemia rule out GI loss Hypertension Diabetes mellitus Hyperlipidemia Anxiety disorder Bronchial asthma  Plan Continue present management. Check records from [place]. Will switch to Eliquis If no further episodes of epistaxis from tomorrow. Social services for discharge planning. Check labs in a.m.  LOS: 2 days    Charolette Forward 09/27/2017, 2:27 PM

## 2017-09-27 NOTE — Progress Notes (Signed)
Patient declines labs.

## 2017-09-27 NOTE — Clinical Social Work Note (Signed)
Clinical Social Work Assessment  Patient Details  Name: Sharon Spears MRN: [id number] Date of Birth: Jun 18, 1945  Date of referral:  09/27/17               Reason for consult:  Discharge Planning                Permission sought to share information with:  Family Supports Permission granted to share information::  Yes, Verbal Permission Granted  Name::     Ival Bible  Agency::     Relationship::  Daughter  Contact Information:  [phone]  Housing/Transportation Living arrangements for the past 2 months:  Apartment Source of Information:  Patient, Medical Team Patient Interpreter Needed:  None Criminal Activity/Legal Involvement Pertinent to Current Situation/Hospitalization:  No - Comment as needed Significant Relationships:  Adult Children Lives with:  Adult Children Do you feel safe going back to the place where you live?  Yes Need for family participation in patient care:  Yes (Comment)  Care giving concerns:  Patient's daughter is reportedly saying patient cannot return home with her.   Social Worker assessment / plan:  CSW met with patient. No supports at bedside. CSW introduced role and inquired about needs. Patient stated her daughter does not want her to be in her home alone during the day due to patient wanting to "heat things up in the oven." Patient inquiring about ALF. CSW provided list of local facilities but patient understands we do not place patients in ALF from the hospital. CSW explained that according to our system, she does not have Medicaid and she would have to pay privately. 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 She stated she had Medicaid but when she was hospitalized and went to rehab in [place], they switched it to [place] Medicaid. CSW advised patient to call DSS to start process of switching in back to [place]. Patient reported she got out of rehab on 8/30 had been there at least one month. CSW spoke with chaplain in the hallway. Patient told him that while in  [place], she signed a blank form regarding her insurance but does not know what it was and is concerned it may have negatively impacted her insurance coverage. CSW Advice worker asking her to check on this. Patient wants CSW to discuss insurance concerns with her daughter, Ival Bible [phone]). She lives in [place] and is currently in school so she texted her asking her to call CSW when she gets out of class. Patient does not want CSW to mention housing concerns due to being afraid it will cause tension in her family. CSW provided patient with shelter list in case daughter continues to refuse to let her return home. 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 No further concerns. CSW encouraged patient to contact CSW as needed. CSW will continue to follow patient for support.  Employment status:  Retired Forensic scientist:  Medicare PT Recommendations:  Not assessed at this time [place] / Referral to community resources:  Shelter, Other (Comment Required)(ALF)  Patient/Family's Response to care:  Patient agreeable to discussing concerns with CSW. Patient's daughter supportive and involved in patient's care. Patient appreciated social work intervention.  Patient/Family's Understanding of and Emotional Response to Diagnosis, Current Treatment, and Prognosis:  Patient has a good understanding of the reason for admission and social work consult. Patient appears happy with hospital care.  Emotional Assessment Appearance:  Appears stated age Attitude/Demeanor/Rapport:  Engaged, Gracious Affect (typically observed):  Accepting,  Appropriate, Calm, Pleasant Orientation:  Oriented to Self, Oriented to Place, Oriented to  Time, Oriented to Situation Alcohol / Substance use:  Never Used Psych involvement (Current and /or in the community):  No (Comment)  Discharge Needs  Concerns to be addressed:  Care Coordination Readmission within the last 30 days:   No Current discharge risk:  Homeless(Potentially) Barriers to Discharge:  Continued Medical Work up   Candie Chroman, LCSW 09/27/2017, 2:07 PM

## 2017-09-27 NOTE — Progress Notes (Signed)
Patient states she has the records from surgery in [place] at home. Message left for daughter, [name], regarding bringing paperwork to hospital. Message left for daughter, Charlesetta Shanks that lives in [place] also. Patient states Charlesetta Shanks has paperwork regarding surgery also. [facility] fax number given to family.

## 2017-09-27 NOTE — Progress Notes (Signed)
ANTICOAGULATION CONSULT NOTE - Initial Consult  Pharmacy Consult for Lovenox Indication: atrial fibrillation  Allergies  Allergen Reactions  . Penicillins Other (See Comments)    GI upset Has patient had a PCN reaction causing immediate rash, facial/tongue/throat swelling, SOB or lightheadedness with hypotension: No Has patient had a PCN reaction causing severe rash involving mucus membranes or skin necrosis: No Has patient had a PCN reaction that required hospitalization: No Has patient had a PCN reaction occurring within the last 10 years: No If all of the above answers are "NO", then may proceed with Cephalosporin use.    Patient Measurements: Height: _0  (157.5 cm) Weight: 131 lb 1.6 oz (59.5 kg) IBW/kg (Calculated) : 50.1  Vital Signs: Temp: 98.4 F (36.9 C) (09/17 0428) Temp Source: Oral (09/17 0428) BP: 129/60 (09/17 0428) Pulse Rate: 67 (09/17 0428)  Labs: Recent Labs    09/25/17 0954 09/25/17 1627 09/25/17 2153 09/26/17 0350 09/26/17 1320 09/27/17 0954  HGB 8.4*  --   --   --  9.1*  --   HCT 28.4*  --   --   --  30.2*  --   PLT 222  --   --   --  280  --   CREATININE 0.71  --   --  0.81 0.92 0.99  TROPONINI  --  <0.03 <0.03 <0.03  --   --     Estimated Creatinine Clearance: 41.2 mL/min (by C-G formula based on SCr of 0.99 mg/dL).   Medical History: Past Medical History:  Diagnosis Date  . Asthma   . Coronary artery disease   . Diabetes mellitus   . Heart palpitations   . Hypertension    Assessment:  Anticoag: Afib, CHA2DS2VASc of 5. Hgb down to 8.4. Eliquis dosing is once daily?? (LD 9/16 AM). r/o GI cause of anemia. Nose bleed resolved this AM 9/17. Patient refusing care and labs. Dr. Terrence Dupont ok to try starting Lovenox this AM instead of IV heparin. Check stat CBC.  - 9/16: 1550: Received call from RN that patient was experiencing bad nosebleed and was instructed to hold IV heparin tonight by Harwani.  Goal of Therapy:  Anti-Xa level 0.6-1  units/ml 4hrs after LMWH dose given Monitor platelets by anticoagulation protocol: Yes   Plan:  Stat CBC Lovenox 28m SQ BID  Secilia Apps S. RAlford Highland PharmD, BCPS Clinical Staff Pharmacist [phone] REilene GhaziStillinger 09/27/2017,11:40 AM

## 2017-09-27 NOTE — Progress Notes (Addendum)
Patient continued to refuse labs and tele box. Patient states she doesn't know who ordered labs. I told patient that Dr. Terrence Dupont has ordered everything for her. Patient is now agreeable to have labs drawn. Phlebotomist made aware. Patient also states the electrodes make her itch. Chest cleaned off and new electrodes placed. Telemetry box now on patient.

## 2017-09-27 NOTE — Progress Notes (Signed)
Patient declines CBG this evening.

## 2017-09-28 LAB — CBC
HCT: 32.6 % — ABNORMAL LOW (ref 36.0–46.0)
Hemoglobin: 9.7 g/dL — ABNORMAL LOW (ref 12.0–15.0)
MCH: 22.5 pg — AB (ref 26.0–34.0)
MCHC: 29.8 g/dL — ABNORMAL LOW (ref 30.0–36.0)
MCV: 75.6 fL — ABNORMAL LOW (ref 78.0–100.0)
PLATELETS: 301 10*3/uL (ref 150–400)
RBC: 4.31 MIL/uL (ref 3.87–5.11)
RDW: 17.4 % — ABNORMAL HIGH (ref 11.5–15.5)
WBC: 7.6 10*3/uL (ref 4.0–10.5)

## 2017-09-28 LAB — BASIC METABOLIC PANEL
Anion gap: 14 (ref 5–15)
BUN: 29 mg/dL — ABNORMAL HIGH (ref 8–23)
CALCIUM: 8.8 mg/dL — AB (ref 8.9–10.3)
CO2: 27 mmol/L (ref 22–32)
CREATININE: 1.05 mg/dL — AB (ref 0.44–1.00)
Chloride: 96 mmol/L — ABNORMAL LOW (ref 98–111)
GFR calc non Af Amer: 52 mL/min — ABNORMAL LOW (ref >60)
GLUCOSE: 142 mg/dL — AB (ref 70–99)
Potassium: 3 mmol/L — ABNORMAL LOW (ref 3.5–5.1)
Sodium: 137 mmol/L (ref 135–145)

## 2017-09-28 LAB — GLUCOSE, CAPILLARY: GLUCOSE-CAPILLARY: 133 mg/dL — AB (ref 70–99)

## 2017-09-28 MED ORDER — PRO-STAT SUGAR FREE PO LIQD: 30.0000 mL | Freq: Two times a day (BID) | ORAL | 0 refills | Status: DC

## 2017-09-28 MED ORDER — APIXABAN 5 MG PO TABS: 5.0000 mg | ORAL_TABLET | Freq: Two times a day (BID) | ORAL | 3 refills | Status: DC

## 2017-09-28 MED ORDER — FERROUS SULFATE 325 (65 FE) MG PO TABS: 325.0000 mg | ORAL_TABLET | Freq: Three times a day (TID) | ORAL | 3 refills | Status: DC

## 2017-09-28 MED ORDER — FUROSEMIDE 20 MG PO TABS: 40.0000 mg | ORAL_TABLET | Freq: Every day | ORAL | 0 refills | Status: DC

## 2017-09-28 MED ORDER — ADULT MULTIVITAMIN W/MINERALS CH: 1.0000 | ORAL_TABLET | Freq: Every day | ORAL | 3 refills | Status: DC

## 2017-09-28 MED ORDER — APIXABAN 5 MG PO TABS: 5.0000 mg | ORAL_TABLET | Freq: Two times a day (BID) | ORAL | Status: DC

## 2017-09-28 MED ORDER — POTASSIUM CHLORIDE CRYS ER 20 MEQ PO TBCR
40.0000 meq | EXTENDED_RELEASE_TABLET | Freq: Once | ORAL | Status: AC
  Administered 2017-09-28: 40 meq via ORAL
  Filled 2017-09-28: qty 2

## 2017-09-28 NOTE — Discharge Summary (Signed)
NAMELARINE, FIELDING MEDICAL RECORD [id number] ACCOUNT [id number] DATE OF BIRTH:17-Oct-1945 FACILITY: [facility] LOCATION: [facility] PHYSICIAN:Takila Kronberg Daivd Council, MD  CONSULTATION  DATE OF ADMISSION:  09/28/2017  Patient was admitted by Dr. Doylene Canard was 09/25/2017  ADMITTING DIAGNOSIS:   1.  Acute systolic congestive heart failure.   2.  Exacerbation of asthma. 3.  Coronary artery disease status post coronary artery bypass grafting, questionable valve replacement. 4.  Questionable mitral valve repair. 5.  Hypertension. 6.  Diabetes mellitus. 7.  Anemia, probably postop anemia, rule out gastrointestinal loss.  DISCHARGE DIAGNOSES: 1.  Compensated systolic congestive heart failure, status post questionable mitral valve endocarditis, status post mitral valve replacement in [place]; records not available. 2.  Chronic atrial fibrillation, CHADS-VASc score of 4, on Eliquis. 3.  Probable postop anemia, stable. 4.  Hypertension. 5.  Diabetes mellitus. 6.  Hyperlipidemia. 7.  Anxiety disorder. 8.  Bronchial asthma.  DISCHARGE HOME MEDICATIONS:   1.  Feeding supplement 30 mL Pro-Stat, sugar free, twice daily 2.  Ferrous sulfate 325 mg 3 times daily 3  Multivitamin one tablet daily 4.  Tylenol 500 mg as needed 5.  Albuterol inhaler by nebulizer every 6 hours as needed 6.  Albuterol inhaler 2 puffs every 6 hours as needed.  7.  Atorvastatin 80 mg daily. 8.  Lotrimin 1% cream apply locally as before. 9.  Pepcid 20 mg twice daily. 10.  Metoprolol succinate 50 mg daily. 11.  Nitrostat sublingual p.r.n. 12.  Ambien 5 mg daily at night as needed. 13.  Eliquis 5 mg twice daily. 14.  Flonase as needed. 15.  Lasix 40 mg daily. 16.  Metformin 500 mg daily.    Patient has been advised to stop Xanax, aspirin, and ibuprofen.  DIET:  Low salt, low cholesterol 1800 calories ADA diet.    The patient has been advised to monitor blood pressure and blood sugar regularly and chart.    Follow up with  me in 1 week in Heart Failure.  Instructions have been given.  CONDITION AT DISCHARGE:  Stable.  HISTORY AND HOSPITAL COURSE: The patient is a 72 year old female with past medical history significant for recent history of pneumonia, 3-4 months ago.  Had shortness of breath, chest pain with cough a few days, also had mild bilateral leg edema.  PAST MEDICAL HISTORY:  Significant for coronary artery disease, history of questionable coronary artery bypass grafting, type 2 diabetes mellitus, hypertension and asthma.  Her x-ray was positive bibasilar infiltrative changes, left more than the right  with mild effusion and cardiomegaly.  Her BNP was elevated to 762.  Her EKG showed chronic atrial fibrillation, rate controlled with CHADS-VASc score of 4 on Eliquis, but now has developed significant anemia, as she has dropped her hemoglobin from 15.6  to 8.4 in the last five months.  The patient states she had surgery in the interim a few months ago in [place], but old records for records from [place], not available.  The patient denies noticing dark stools or bright red blood per rectum.  PHYSICAL EXAMINATION: VITAL SIGNS:  Blood pressure was 112/69, pulse 89, respiration rate 19.  She was alert, cooperative. HEENT:  Head normocephalic, atraumatic.  Eyes round.  Pink conjunctivae.  PERRLA.  Extraocular muscles are intact. LUNGS:  Crackles at bases. HEART:  Irregularly irregular S1, S2 was normal.  There was II/VI systolic murmur. ABDOMEN:  Soft.  Bowel sounds present, nontender. EXTREMITIES:  1+ edema.  No cyanosis or clubbing.  LABORATORY DATA:  Sodium  was 135, potassium 4.1, BUN 16, creatinine 0.71, glucose was 121.  BNP was 762.  Her hemoglobin was 8.4, hematocrit 28.4, white count of 5.6.    EKG showed atrial fibrillation with poor R-wave progression in anterior leads.    Repeat hemoglobin was 9.1, hematocrit 30.2, white count of 6.3.  Repeat hemoglobin today is 9.7, hematocrit 32.6, white count of  7.6, which has been stable.  Last electrolytes:  Sodium is 137, potassium is 3.0, which was replaced earlier this morning.   BUN is 29, creatinine 1.05.  Blood sugar is 142.  BRIEF HOSPITAL COURSE:  The patient was admitted to Telemetry Unit and was diuresed with IV Lasix with good diuresis.  The patient did not have any chest pain during the hospital stay.  Her breathing has markedly improved.  Her leg swelling is completely  resolved.  The patient is ambulating in room without any problems.  The patient has refused multiple times wearing the monitor and has refused for the labs also.  The patient will be discharged home.  Social Service was contacted regarding discharge  planning.  The patient is not a candidate for nursing home placement.  The patient will be discharged home.  Discussed with the patient regarding contacting her family, which she has refused.  The patient will be given phone numbers for these multiple  shelters and assisted living and will be discharged and will be followed closely.  AN/NUANCE D:09/28/2017 T:09/28/2017 JOB:[id number]

## 2017-09-28 NOTE — Clinical Social Work Note (Addendum)
Patient requesting emergency Medicaid. Per Development worker, community, that is only for non-[place] residents in life or death situations. Patient believes this is discriminating. She has been unable to find placement for herself and plans to live in her car and stop at [facility] when she has to use the bathroom. She will not tell her daughter her situation and will not allow CSW to tell her either. RN is aware. Patient has been notified that her discharge orders are in. She plans on calling MD to see if he will cancel them.   Dayton Scrape, [place] [phone]  4:54 pm Patient told RN daughter will pick her up today.  CSW signing off.   Dayton Scrape, [place]

## 2017-09-28 NOTE — Discharge Instructions (Addendum)
Heart Failure Heart failure is a condition in which the heart has trouble pumping blood because it has become weak or stiff. This means that the heart does not pump blood efficiently for the body to work well. For some people with heart failure, fluid may back up into the lungs and there may be swelling (edema) in the lower legs. Heart failure is usually a long-term (chronic) condition. It is important for you to take good care of yourself and follow the treatment plan from your health care provider. What are the causes? This condition is caused by some health problems, including:  High blood pressure (hypertension). Hypertension causes the heart muscle to work harder than normal. High blood pressure eventually causes the heart to become stiff and weak.  Coronary artery disease (CAD). CAD is the buildup of cholesterol and fat (plaques) in the arteries of the heart.  Heart attack (myocardial infarction). Injured tissue, which is caused by the heart attack, does not contract as well and the heart's ability to pump blood is weakened.  Abnormal heart valves. When the heart valves do not open and close properly, the heart muscle must pump harder to keep the blood flowing.  Heart muscle disease (cardiomyopathy or myocarditis). Heart muscle disease is damage to the heart muscle from a variety of causes, such as drug or alcohol abuse, infections, or unknown causes. These can increase the risk of heart failure.  Lung disease. When the lungs do not work properly, the heart must work harder.  What increases the risk? Risk of heart failure increases as a person ages. This condition is also more likely to develop in people who:  Are overweight.  Are female.  Smoke or chew tobacco.  Abuse alcohol or illegal drugs.  Have taken medicines that can damage the heart, such as chemotherapy drugs.  Have diabetes. ? High blood sugar (glucose) is associated with high fat (lipid) levels in the blood. ? Diabetes  can also damage tiny blood vessels that carry nutrients to the heart muscle.  Have abnormal heart rhythms.  Have thyroid problems.  Have low blood counts (anemia).  What are the signs or symptoms? Symptoms of this condition include:  Shortness of breath with activity, such as when climbing stairs.  Persistent cough.  Swelling of the feet, ankles, legs, or abdomen.  Unexplained weight gain.  Difficulty breathing when lying flat (orthopnea).  Waking from sleep because of the need to sit up and get more air.  Rapid heartbeat.  Fatigue and loss of energy.  Feeling light-headed, dizzy, or close to fainting.  Loss of appetite.  Nausea.  Increased urination during the night (nocturia).  Confusion.  How is this diagnosed? This condition is diagnosed based on:  Medical history, symptoms, and a physical exam.  Diagnostic tests, which may include: ? Echocardiogram. ? Electrocardiogram (ECG). ? Chest X-ray. ? Blood tests. ? Exercise stress test. ? Radionuclide scans. ? Cardiac catheterization and angiogram.  How is this treated? Treatment for this condition is aimed at managing the symptoms of heart failure. Medicines, behavioral changes, or other treatments may be necessary to treat heart failure. Medicines These may include:  Angiotensin-converting enzyme (ACE) inhibitors. This type of medicine blocks the effects of a blood protein called angiotensin-converting enzyme. ACE inhibitors relax (dilate) the blood vessels and help to lower blood pressure.  Angiotensin receptor blockers (ARBs). This type of medicine blocks the actions of a blood protein called angiotensin. ARBs dilate the blood vessels and help to lower blood pressure.  Water  pills (diuretics). Diuretics cause the kidneys to remove salt and water from the blood. The extra fluid is removed through urination, leaving a lower volume of blood that the heart has to pump.  Beta blockers. These improve heart  muscle strength and they prevent the heart from beating too quickly.  Digoxin. This increases the force of the heartbeat.  Healthy behavior changes These may include:  Reaching and maintaining a healthy weight.  Stopping smoking or chewing tobacco.  Eating heart-healthy foods.  Limiting or avoiding alcohol.  Stopping use of street drugs (illegal drugs).  Physical activity.  Other treatments These may include:  Surgery to open blocked coronary arteries or repair damaged heart valves.  Placement of a biventricular pacemaker to improve heart muscle function (cardiac resynchronization therapy). This device paces both the right ventricle and left ventricle.  Placement of a device to treat serious abnormal heart rhythms (implantable cardioverter defibrillator, or ICD).  Placement of a device to improve the pumping ability of the heart (left ventricular assist device, or LVAD).  Heart transplant. This can cure heart failure, and it is considered for certain patients who do not improve with other therapies.  Follow these instructions at home: Medicines  Take over-the-counter and prescription medicines only as told by your health care provider. Medicines are important in reducing the workload of your heart, slowing the progression of heart failure, and improving your symptoms. ? Do not stop taking your medicine unless your health care provider told you to do that. ? Do not skip any dose of medicine. ? Refill your prescriptions before you run out of medicine. You need your medicines every day. Eating and drinking   Eat heart-healthy foods. Talk with a dietitian to make an eating plan that is right for you. ? Choose foods that contain no trans fat and are low in saturated fat and cholesterol. Healthy choices include fresh or frozen fruits and vegetables, fish, lean meats, legumes, fat-free or low-fat dairy products, and whole-grain or high-fiber foods. ? Limit salt (sodium) if  directed by your health care provider. Sodium restriction may reduce symptoms of heart failure. Ask a dietitian to recommend heart-healthy seasonings. ? Use healthy cooking methods instead of frying. Healthy methods include roasting, grilling, broiling, baking, poaching, steaming, and stir-frying.  Limit your fluid intake if directed by your health care provider. Fluid restriction may reduce symptoms of heart failure. Lifestyle  Stop smoking or using chewing tobacco. Nicotine and tobacco can damage your heart and your blood vessels. Do not use nicotine gum or patches before talking to your health care provider.  Limit alcohol intake to no more than 1 drink per day for non-pregnant women and 2 drinks per day for men. One drink equals 12 oz of beer, 5 oz of wine, or 1 oz of hard liquor. ? Drinking more than that is harmful to your heart. Tell your health care provider if you drink alcohol several times a week. ? Talk with your health care provider about whether any level of alcohol use is safe for you. ? If your heart has already been damaged by alcohol or you have severe heart failure, drinking alcohol should be stopped completely.  Stop use of illegal drugs.  Lose weight if directed by your health care provider. Weight loss may reduce symptoms of heart failure.  Do moderate physical activity if directed by your health care provider. People who are elderly and people with severe heart failure should consult with a health care provider for physical activity recommendations.  Monitor important information  Weigh yourself every day. Keeping track of your weight daily helps you to notice excess fluid sooner. ? Weigh yourself every morning after you urinate and before you eat breakfast. ? Wear the same amount of clothing each time you weigh yourself. ? Record your daily weight. Provide your health care provider with your weight record.  Monitor and record your blood pressure as told by your health  care provider.  Check your pulse as told by your health care provider. Dealing with extreme temperatures  If the weather is extremely hot: ? Avoid vigorous physical activity. ? Use air conditioning or fans or seek a cooler location. ? Avoid caffeine and alcohol. ? Wear loose-fitting, lightweight, and light-colored clothing.  If the weather is extremely cold: ? Avoid vigorous physical activity. ? Layer your clothes. ? Wear mittens or gloves, a hat, and a scarf when you go outside. ? Avoid alcohol. General instructions  Manage other health conditions such as hypertension, diabetes, thyroid disease, or abnormal heart rhythms as told by your health care provider.  Learn to manage stress. If you need help to do this, ask your health care provider.  Plan rest periods when fatigued.  Get ongoing education and support as needed.  Participate in or seek rehabilitation as needed to maintain or improve independence and quality of life.  Stay up to date with immunizations. Keeping current on pneumococcal and influenza immunizations is especially important to prevent respiratory infections.  Keep all follow-up visits as told by your health care provider. This is important. Contact a health care provider if:  You have a rapid weight gain.  You have increasing shortness of breath that is unusual for you.  You are unable to participate in your usual physical activities.  You tire easily.  You cough more than normal, especially with physical activity.  You have any swelling or more swelling in areas such as your hands, feet, ankles, or abdomen.  You are unable to sleep because it is hard to breathe.  You feel like your heart is beating quickly (palpitations).  You become dizzy or light-headed when you stand up. Get help right away if:  You have difficulty breathing.  You notice or your family notices a change in your awareness, such as having trouble staying awake or having  difficulty with concentration.  You have pain or discomfort in your chest.  You have an episode of fainting (syncope). This information is not intended to replace advice given to you by your health care provider. Make sure you discuss any questions you have with your health care provider. Document Released: 12/28/2004 Document Revised: 09/02/2015 Document Reviewed: 07/23/2015 Elsevier Interactive Patient Education  2018 [place] on my medicine - ELIQUIS (apixaban)  Why was Eliquis prescribed for you? Eliquis was prescribed for you to reduce the risk of a blood clot forming that can cause a stroke if you have a medical condition called atrial fibrillation (a type of irregular heartbeat).  What do You need to know about Eliquis ? Take your Eliquis TWICE DAILY - one tablet in the morning and one tablet in the evening with or without food. If you have difficulty swallowing the tablet whole please discuss with your pharmacist how to take the medication safely.  Take Eliquis exactly as prescribed by your doctor and DO NOT stop taking Eliquis without talking to the doctor who prescribed the medication.  Stopping may increase your risk of developing a stroke.  Refill your prescription before you  run out.  After discharge, you should have regular check-up appointments with your healthcare provider that is prescribing your Eliquis.  In the future your dose may need to be changed if your kidney function or weight changes by a significant amount or as you get older.  What do you do if you miss a dose? If you miss a dose, take it as soon as you remember on the same day and resume taking twice daily.  Do not take more than one dose of ELIQUIS at the same time to make up a missed dose.  Important Safety Information A possible side effect of Eliquis is bleeding. You should call your healthcare provider right away if you experience any of the following: ? Bleeding from an injury or  your nose that does not stop. ? Unusual colored urine (red or dark brown) or unusual colored stools (red or black). ? Unusual bruising for unknown reasons. ? A serious fall or if you hit your head (even if there is no bleeding).  Some medicines may interact with Eliquis and might increase your risk of bleeding or clotting while on Eliquis. To help avoid this, consult your healthcare provider or pharmacist prior to using any new prescription or non-prescription medications, including herbals, vitamins, non-steroidal anti-inflammatory drugs (NSAIDs) and supplements.  This website has more information on Eliquis (apixaban): http://www.eliquis.com/eliquis/home

## 2017-09-28 NOTE — Progress Notes (Signed)
Pt IV removed by nursing student, and telemetry removed by pt. Went over discharge education at bedside. Pt states she has no metoprolol and cannot receive medications at [facility]. Called MD, requested all printed prescriptions. Pt has all belongings and states he daughter will be picking her up after work. Attempted to call family, no answer. Primary RN aware

## 2017-09-28 NOTE — Progress Notes (Signed)
Pt states her daughter never answers the phone but she will text her. Pt states she lost her size 7 black and white sandals, called ED. ED does not have belongings matching that description. MD Harwani unable to print prescriptions per pt request. Stated to give all evening medications and have pt come to his office tomorrow morning. Informed primary RN

## 2017-09-28 NOTE — Progress Notes (Signed)
Pt now refusing cardiac monitor. Pt refused lab draw earlier this shift. Awaiting AM lab draw. Will continue to monitor.

## 2017-09-28 NOTE — Progress Notes (Signed)
Nutrition Follow-up  DOCUMENTATION CODES:   Not applicable  INTERVENTION:   -Continue 30 ml Prostat BID, each supplement provides 100 kcals and 15 grams protein -Continue MVI with minerals daily  NUTRITION DIAGNOSIS:   Inadequate oral intake related to decreased appetite as evidenced by meal completion < 50%, per patient/family report.  Ongoing  GOAL:   Patient will meet greater than or equal to 90% of their needs  Progressing  MONITOR:   PO intake, Supplement acceptance, Labs, Weight trends, Skin, I & O's  REASON FOR ASSESSMENT:   Malnutrition Screening Tool    ASSESSMENT:   72 year old female with recent history of pneumonia, 3-4 months ago in [place], has shortness of breath and chest pain with cough for few days  Reviewed I/O's: 120 ml x 24 hours and -3.9 L since admission  Pt in bathroom at time of visit, being assisted by nurse tech.   Per chart review, intake has improved. Noted documented meal completion 25-100% (averaging around 60% of meals). Pt is also taking Prostat supplements.   CSW assisting pt with most appropriate discharge disposition.   Labs reviewed: CBGS: 133-145 (inpatient orders for glycemic control are 500 mg metformin BID).  Diet Order:   Diet Order            Diet Heart Room service appropriate? Yes; Fluid consistency: Thin; Fluid restriction: 1500 mL Fluid  Diet effective now              EDUCATION NEEDS:   Education needs have been addressed  Skin:  Skin Assessment: Reviewed RN Assessment  Last BM:  09/25/17  Height:   Ht Readings from Last 1 Encounters:  09/25/17 5' 2" (1.575 m)    Weight:   Wt Readings from Last 1 Encounters:  09/28/17 58.8 kg    Ideal Body Weight:  50 kg  BMI:  Body mass index is 23.7 kg/m.  Estimated Nutritional Needs:   Kcal:  1650-1850  Protein:  80-95 grams  Fluid:  1.6-1.8 L    Mael Delap A. Jimmye Norman, RD, LDN, CDE Pager: [phone] After hours Pager: [phone]

## 2017-09-28 NOTE — Progress Notes (Signed)
Pt continues to refuse telemetry. Pt emotional about current state of living arrangements.

## 2017-09-28 NOTE — Progress Notes (Signed)
ANTICOAGULATION CONSULT NOTE - Initial Consult  Pharmacy Consult for Eliquis Indication: atrial fibrillation  Allergies  Allergen Reactions  . Penicillins Other (See Comments)    GI upset Has patient had a PCN reaction causing immediate rash, facial/tongue/throat swelling, SOB or lightheadedness with hypotension: No Has patient had a PCN reaction causing severe rash involving mucus membranes or skin necrosis: No Has patient had a PCN reaction that required hospitalization: No Has patient had a PCN reaction occurring within the last 10 years: No If all of the above answers are "NO", then may proceed with Cephalosporin use.    Patient Measurements: Height: _0  (157.5 cm) Weight: 129 lb 9.6 oz (58.8 kg)(scale a) IBW/kg (Calculated) : 50.1  Vital Signs: Temp: 98.1 F (36.7 C) (09/18 0509) Temp Source: Oral (09/18 0509) BP: 124/55 (09/18 0509) Pulse Rate: 74 (09/18 0509)  Labs: Recent Labs    09/25/17 1627 09/25/17 2153  09/26/17 0350 09/26/17 1320 09/27/17 0954 09/28/17 0600  HGB  --   --   --   --  9.1*  --  9.7*  HCT  --   --   --   --  30.2*  --  32.6*  PLT  --   --   --   --  280  --  301  CREATININE  --   --    < > 0.81 0.92 0.99 1.05*  TROPONINI <0.03 <0.03  --  <0.03  --   --   --    < > = values in this interval not displayed.    Estimated Creatinine Clearance: 38.9 mL/min (A) (by C-G formula based on SCr of 1.05 mg/dL (H)).   Medical History: Past Medical History:  Diagnosis Date  . Asthma   . Coronary artery disease   . Diabetes mellitus   . Heart palpitations   . Hypertension     Assessment:  Anticoag: Afib, CHA2DS2VASc of 5. Eliquis dosing is once daily?? (LD 9/16 AM). r/o GI cause of anemia. Nose bleed resolved AM 9/17. Pt refuses care and labs at times. Hgb trending up 9.7. Plts 301 up. Resume Eliquis dosing.  Goal of Therapy:  Therapeutic oral anticoagulation   Plan:  D/c Lovenox (refusing) Resume Eliquis 58m BID Pt needs to be educated  this is TWICE a day (not once a day as on med rec)  Johneric Mcfadden S. RAlford Highland PharmD, BCPS Clinical Staff Pharmacist Pager [phone] REilene GhaziStillinger 09/28/2017,12:15 PM

## 2017-09-28 NOTE — Progress Notes (Signed)
Pt aware needing to go to MD [facility] office tomorrow  Pt discharged via wheelchair with nurse tech  Per primary RN pt refusing to take medications

## 2017-09-28 NOTE — Discharge Summary (Signed)
ischarge summary dictated on9/18/2019, dictation number is [phone]

## 2017-09-28 NOTE — Care Management Important Message (Signed)
Important Message  Patient Details  Name: Sharon Spears MRN: [id number] Date of Birth: 1945-06-14   Medicare Important Message Given:  Yes    Barb Merino [name] 09/28/2017, 2:47 PM

## 2017-10-05 DIAGNOSIS — E785 Hyperlipidemia, unspecified: Secondary | ICD-10-CM | POA: Diagnosis not present

## 2017-10-05 DIAGNOSIS — F419 Anxiety disorder, unspecified: Secondary | ICD-10-CM | POA: Diagnosis not present

## 2017-10-05 DIAGNOSIS — I5020 Unspecified systolic (congestive) heart failure: Secondary | ICD-10-CM | POA: Diagnosis not present

## 2017-10-05 DIAGNOSIS — J45909 Unspecified asthma, uncomplicated: Secondary | ICD-10-CM | POA: Diagnosis not present

## 2017-10-05 DIAGNOSIS — D649 Anemia, unspecified: Secondary | ICD-10-CM | POA: Diagnosis not present

## 2017-10-05 DIAGNOSIS — Z952 Presence of prosthetic heart valve: Secondary | ICD-10-CM | POA: Diagnosis not present

## 2017-10-05 DIAGNOSIS — I10 Essential (primary) hypertension: Secondary | ICD-10-CM | POA: Diagnosis not present

## 2017-10-14 ENCOUNTER — Other Ambulatory Visit: Payer: Self-pay

## 2017-10-14 ENCOUNTER — Ambulatory Visit (INDEPENDENT_AMBULATORY_CARE_PROVIDER_SITE_OTHER)
Admission: EM | Admit: 2017-10-14 | Discharge: 2017-10-14 | Disposition: A | Discharge status: Home / Self Care | Payer: Medicare Other | Source: Home / Self Care | Attending: Family Medicine | Admitting: Family Medicine

## 2017-10-14 ENCOUNTER — Emergency Department (HOSPITAL_COMMUNITY): Payer: Medicare Other

## 2017-10-14 ENCOUNTER — Encounter (HOSPITAL_COMMUNITY): Payer: Self-pay

## 2017-10-14 ENCOUNTER — Emergency Department (HOSPITAL_COMMUNITY)
Admission: EM | Admit: 2017-10-14 | Discharge: 2017-10-14 | Disposition: A | Discharge status: Home / Self Care | Payer: Medicare Other | Attending: Emergency Medicine | Admitting: Emergency Medicine

## 2017-10-14 DIAGNOSIS — I2510 Atherosclerotic heart disease of native coronary artery without angina pectoris: Secondary | ICD-10-CM | POA: Diagnosis not present

## 2017-10-14 DIAGNOSIS — Z7984 Long term (current) use of oral hypoglycemic drugs: Secondary | ICD-10-CM | POA: Insufficient documentation

## 2017-10-14 DIAGNOSIS — Z7982 Long term (current) use of aspirin: Secondary | ICD-10-CM | POA: Insufficient documentation

## 2017-10-14 DIAGNOSIS — R0789 Other chest pain: Secondary | ICD-10-CM | POA: Diagnosis present

## 2017-10-14 DIAGNOSIS — R079 Chest pain, unspecified: Secondary | ICD-10-CM

## 2017-10-14 DIAGNOSIS — I110 Hypertensive heart disease with heart failure: Secondary | ICD-10-CM | POA: Diagnosis not present

## 2017-10-14 DIAGNOSIS — Z951 Presence of aortocoronary bypass graft: Secondary | ICD-10-CM

## 2017-10-14 DIAGNOSIS — E119 Type 2 diabetes mellitus without complications: Secondary | ICD-10-CM | POA: Insufficient documentation

## 2017-10-14 DIAGNOSIS — Z7901 Long term (current) use of anticoagulants: Secondary | ICD-10-CM | POA: Insufficient documentation

## 2017-10-14 DIAGNOSIS — Z79899 Other long term (current) drug therapy: Secondary | ICD-10-CM | POA: Insufficient documentation

## 2017-10-14 DIAGNOSIS — Z8679 Personal history of other diseases of the circulatory system: Secondary | ICD-10-CM | POA: Diagnosis not present

## 2017-10-14 DIAGNOSIS — I5022 Chronic systolic (congestive) heart failure: Secondary | ICD-10-CM | POA: Diagnosis not present

## 2017-10-14 DIAGNOSIS — J45909 Unspecified asthma, uncomplicated: Secondary | ICD-10-CM | POA: Insufficient documentation

## 2017-10-14 LAB — I-STAT TROPONIN, ED
Troponin i, poc: 0 ng/mL (ref 0.00–0.08)
Troponin i, poc: 0 ng/mL (ref 0.00–0.08)

## 2017-10-14 LAB — BASIC METABOLIC PANEL
ANION GAP: 9 (ref 5–15)
BUN: 18 mg/dL (ref 8–23)
CHLORIDE: 106 mmol/L (ref 98–111)
CO2: 23 mmol/L (ref 22–32)
Calcium: 9 mg/dL (ref 8.9–10.3)
Creatinine, Ser: 0.84 mg/dL (ref 0.44–1.00)
GFR calc Af Amer: >60 mL/min (ref >60)
GFR calc non Af Amer: >60 mL/min (ref >60)
GLUCOSE: 130 mg/dL — AB (ref 70–99)
POTASSIUM: 3.7 mmol/L (ref 3.5–5.1)
Sodium: 138 mmol/L (ref 135–145)

## 2017-10-14 LAB — CBC
HEMATOCRIT: 32.6 % — AB (ref 36.0–46.0)
Hemoglobin: 9.6 g/dL — ABNORMAL LOW (ref 12.0–15.0)
MCH: 23.2 pg — AB (ref 26.0–34.0)
MCHC: 29.4 g/dL — AB (ref 30.0–36.0)
MCV: 78.7 fL (ref 78.0–100.0)
Platelets: 177 10*3/uL (ref 150–400)
RBC: 4.14 MIL/uL (ref 3.87–5.11)
RDW: 19 % — ABNORMAL HIGH (ref 11.5–15.5)
WBC: 5.3 10*3/uL (ref 4.0–10.5)

## 2017-10-14 MED ORDER — ACETAMINOPHEN 325 MG PO TABS
650.0000 mg | ORAL_TABLET | Freq: Once | ORAL | Status: AC
  Administered 2017-10-14: 650 mg via ORAL
  Filled 2017-10-14: qty 2

## 2017-10-14 MED ORDER — HYDROCODONE-ACETAMINOPHEN 5-325 MG PO TABS: 1.0000 | ORAL_TABLET | ORAL | 0 refills | Status: DC | PRN

## 2017-10-14 NOTE — ED Triage Notes (Signed)
Pt presents with 2 day h/o L sided chest pain.  Pt reports pain is worse at night, denies any shortness of breath.  Pt reports she went to urgent care for medication and referred here.

## 2017-10-14 NOTE — Discharge Instructions (Signed)
If your chest pain worsens, does not improve, you develop shortness of breath, leg swelling, vomiting, or any other new/concerning symptoms then return to the ER for evaluation.  Otherwise follow-up closely with Dr Terrence Dupont.

## 2017-10-14 NOTE — ED Notes (Signed)
Pt wheeled to the ER by this RN for further evaluation.

## 2017-10-14 NOTE — ED Provider Notes (Signed)
[facility]    CSN: [id number] Arrival date & time: 10/14/17  1309     History   Chief Complaint Chief Complaint  Patient presents with  . post op pain    HPI Sharon Spears is a 72 y.o. female.   HPI  Patient has known heart disease, atrial fibrillation on Eliquis, coronary artery disease with coronary artery bypass grafting performed in June 2019 with possible valve replacement, mitral, she has hypertension, well-controlled diabetes, and asthma. She is here today with left chest pain.  She states is been bothering her ever since her surgery.  Keeps her awake at night.  She states she is out of her pain medication.  She is unable to tell me which medicine she is out of.  She tells me that she had some chest pain last night and "should have taken nitro".  Today she has pain in her left chest, she states it is directly underneath her breast.  It is not tender to touch.  She feels mildly short of breath.  She does not think it is cardiac pain, but thinks it is from her surgery.  No radiation.  No diaphoresis.  No nausea.  She is here alone.  Was hospitalized in September with congestive heart failure.   Past Medical History:  Diagnosis Date  . Asthma   . Coronary artery disease   . Diabetes mellitus   . Heart palpitations   . Hypertension     Patient Active Problem List   Diagnosis Date Noted  . Acute on chronic left systolic heart failure ([place]) 09/25/2017  . Heart palpitations 06/02/2016  . Acute on chronic respiratory failure with hypoxia ([place]) 03/20/2015  . Asthma exacerbation 03/20/2015  . Laryngitis, acute 03/20/2015  . Thrombocytopenia ([place]) 03/20/2015  . Diabetes mellitus type 2, controlled ([place]) 03/20/2015  . HTN (hypertension) 03/20/2015  . S/P CABG (coronary artery bypass graft) 03/20/2015    Past Surgical History:  Procedure Laterality Date  . CESAREAN SECTION    . CORONARY ARTERY BYPASS GRAFT    . ESOPHAGEAL MANOMETRY N/A 03/06/2012   Procedure:  ESOPHAGEAL MANOMETRY (EM);  Surgeon: Beryle Beams, MD;  Location: [facility];  Service: Endoscopy;  Laterality: N/A;  . TONSILLECTOMY      OB History   None      Home Medications    Prior to Admission medications   Medication Sig Start Date End Date Taking? Authorizing Provider  Acetaminophen 500 MG coapsule Take 500 mg by mouth as needed.  09/08/17   [provider]  albuterol (PROVENTIL HFA;VENTOLIN HFA) 108 (90 BASE) MCG/ACT inhaler Inhale 2 puffs into the lungs every 6 (six) hours as needed. For wheezing.    [provider]  albuterol (PROVENTIL) (2.5 MG/3ML) 0.083% nebulizer solution Take 3 mLs (2.5 mg total) by nebulization every 6 (six) hours as needed for wheezing or shortness of breath. 11/21/15   Noe Gens, PA-C  Amino Acids-Protein Hydrolys (FEEDING SUPPLEMENT, PRO-STAT SUGAR FREE 64,) LIQD Take 30 mLs by mouth 2 (two) times daily. 09/28/17   Charolette Forward, MD  apixaban (ELIQUIS) 5 MG TABS tablet Take 1 tablet (5 mg total) by mouth 2 (two) times daily. 09/28/17   Charolette Forward, MD  atorvastatin (LIPITOR) 80 MG tablet Take 80 mg by mouth daily.    [provider]  clotrimazole (LOTRIMIN) 1 % cream Apply 1 application topically as needed. 09/08/17   [provider]  famotidine (PEPCID) 20 MG tablet Take 20 mg by mouth  2 (two) times daily. 02/04/15   [provider]  ferrous sulfate 325 (65 FE) MG tablet Take 1 tablet (325 mg total) by mouth 3 (three) times daily with meals. 09/28/17   Charolette Forward, MD  fluticasone (FLONASE) 50 MCG/ACT nasal spray Place 2 sprays into both nostrils 2 (two) times daily. Patient taking differently: Place 2 sprays into both nostrils as needed.  10/25/15   Jacqualine Mau, NP  furosemide (LASIX) 20 MG tablet Take 2 tablets (40 mg total) by mouth daily. 09/28/17   Charolette Forward, MD  metFORMIN (GLUCOPHAGE XR) 500 MG 24 hr tablet Take 1 tablet (500 mg total) by mouth daily with breakfast. Patient  taking differently: Take 500 mg by mouth 2 (two) times daily.  06/02/16   Golden Circle, FNP  metoprolol succinate (TOPROL-XL) 50 MG 24 hr tablet Take 50 mg by mouth daily. Take with or immediately following a meal.    [provider]  Multiple Vitamin (MULTIVITAMIN WITH MINERALS) TABS tablet Take 1 tablet by mouth daily. 09/28/17   Charolette Forward, MD  nitroGLYCERIN (NITROSTAT) 0.4 MG SL tablet Place 0.4 mg under the tongue every 5 (five) minutes as needed. For chest pain.    [provider]  Spacer/Aero-Holding Chambers (AEROCHAMBER PLUS WITH MASK) inhaler Use as instructed 11/21/15   Noe Gens, PA-C  zolpidem (AMBIEN) 5 MG tablet Take 5 mg by mouth at bedtime as needed for sleep.    [provider]    Family History Family History  Problem Relation Age of Onset  . Asthma Mother   . Alcohol abuse Father     Social History Social History   Tobacco Use  . Smoking status: Never Smoker  . Smokeless tobacco: Never Used  Substance Use Topics  . Alcohol use: No  . Drug use: No     Allergies   Penicillins   Review of Systems Review of Systems See HPI  Physical Exam Triage Vital Signs ED Triage Vitals [10/14/17 1328]  Enc Vitals Group     BP (!) 163/57     Pulse Rate 63     Resp 18     Temp 97.9 F (36.6 C)     Temp src      SpO2 100 %     Weight      Height      Head Circumference      Peak Flow      Pain Score 8     Pain Loc      Pain Edu?      Excl. in [place]?    No data found.  Updated Vital Signs BP (!) 163/57   Pulse 63   Temp 97.9 F (36.6 C)   Resp 18   SpO2 100%       Physical Exam Heart rate is controlled.  Lungs have faint crackles in the bases.  Chest wall tenderness.  UC Treatments / Results  Labs (all labs ordered are listed, but only abnormal results are displayed) Labs Reviewed - No data to display  EKG EKG shows atrial fibrillation with good rate control.  Questionable old infarct.  No acute  changes. Radiology No results found.  Procedures Procedures (including critical care time)  Medications Ordered in UC Medications - No data to display  Initial Impression / Assessment and Plan / UC Course  I have reviewed the triage vital signs and the nursing notes.  Pertinent labs & imaging results that were available during my care  of the patient were reviewed by me and considered in my medical decision making (see chart for details).     I explained to the patient with her history of heart disease, and chest pain she really would be better evaluated in the emergency department.  She was taken down in a wheelchair.  She agrees with ER transfer. Final Clinical Impressions(s) / UC Diagnoses   Final diagnoses:  Chest pain, unspecified type     Discharge Instructions     GO TO ER    ED Prescriptions    None     Controlled Substance Prescriptions [place] Controlled Substance Registry consulted? Not Applicable   Raylene Everts, MD 10/14/17 1353

## 2017-10-14 NOTE — ED Provider Notes (Signed)
[facility] Provider Note   CSN: [id number] Arrival date & time: 10/14/17  1355     History   Chief Complaint Chief Complaint  Patient presents with  . Chest Pain    HPI Sharon Spears is a 72 y.o. female.  HPI  72 year old female presents with left-sided chest pain.  She states she is been having this pain since her CABG about 30 days ago.  The pain is constant.  She thinks it is postop.  However since last night it seems to be a little worse.  Feels like she has been cut in this area under her left breast.  Does not radiate.  No significant shortness of breath.  Chronic lower extremity edema that seems to be improved from prior.  Pain is moderate at this time.  She is requesting a Tylenol for pain. Chest pain seems to be worse with walking.  Past Medical History:  Diagnosis Date  . Asthma   . Coronary artery disease   . Diabetes mellitus   . Heart palpitations   . Hypertension     Patient Active Problem List   Diagnosis Date Noted  . Acute on chronic left systolic heart failure ([place]) 09/25/2017  . Heart palpitations 06/02/2016  . Acute on chronic respiratory failure with hypoxia ([place]) 03/20/2015  . Asthma exacerbation 03/20/2015  . Laryngitis, acute 03/20/2015  . Thrombocytopenia ([place]) 03/20/2015  . Diabetes mellitus type 2, controlled ([place]) 03/20/2015  . HTN (hypertension) 03/20/2015  . S/P CABG (coronary artery bypass graft) 03/20/2015    Past Surgical History:  Procedure Laterality Date  . CESAREAN SECTION    . CORONARY ARTERY BYPASS GRAFT    . ESOPHAGEAL MANOMETRY N/A 03/06/2012   Procedure: ESOPHAGEAL MANOMETRY (EM);  Surgeon: Beryle Beams, MD;  Location: [facility];  Service: Endoscopy;  Laterality: N/A;  . TONSILLECTOMY       OB History   None      Home Medications    Prior to Admission medications   Medication Sig Start Date End Date Taking? Authorizing Provider  acetaminophen (TYLENOL) 500 MG tablet  Take 2,000 mg by mouth every 6 (six) hours as needed (for pain).   Yes [provider]  albuterol (PROVENTIL HFA;VENTOLIN HFA) 108 (90 BASE) MCG/ACT inhaler Inhale 2 puffs into the lungs every 6 (six) hours as needed for wheezing.    Yes [provider]  apixaban (ELIQUIS) 5 MG TABS tablet Take 1 tablet (5 mg total) by mouth 2 (two) times daily. 09/28/17  Yes Charolette Forward, MD  aspirin EC 81 MG tablet Take 81 mg by mouth at bedtime.   Yes [provider]  famotidine (PEPCID) 20 MG tablet Take 20 mg by mouth daily before breakfast.  02/04/15  Yes [provider]  fluticasone (FLONASE) 50 MCG/ACT nasal spray Place 2 sprays into both nostrils 2 (two) times daily. Patient taking differently: Place 2 sprays into both nostrils as needed for allergies or rhinitis.  10/25/15  Yes Jacqualine Mau, NP  furosemide (LASIX) 20 MG tablet Take 2 tablets (40 mg total) by mouth daily. Patient taking differently: Take 20 mg by mouth 2 (two) times daily.  09/28/17  Yes Charolette Forward, MD  metFORMIN (GLUCOPHAGE XR) 500 MG 24 hr tablet Take 1 tablet (500 mg total) by mouth daily with breakfast. Patient taking differently: Take 500 mg by mouth 2 (two) times daily.  06/02/16  Yes Golden Circle, FNP  metoprolol succinate (TOPROL-XL) 50 MG 24 hr  tablet Take 50 mg by mouth daily. Take with or immediately following a meal.   Yes [provider]  Multiple Vitamins-Minerals (ADULT ONE DAILY GUMMIES) CHEW Chew 1 tablet by mouth 2 (two) times a week.   Yes [provider]  nitroGLYCERIN (NITROSTAT) 0.4 MG SL tablet Place 0.4 mg under the tongue every 5 (five) minutes as needed for chest pain.    Yes [provider]  zolpidem (AMBIEN) 5 MG tablet Take 5 mg by mouth at bedtime as needed for sleep.   Yes [provider]  albuterol (PROVENTIL) (2.5 MG/3ML) 0.083% nebulizer solution Take 3 mLs (2.5 mg total) by nebulization every 6 (six) hours as needed for  wheezing or shortness of breath. Patient not taking: Reported on 10/14/2017 11/21/15   Noe Gens, PA-C  Amino Acids-Protein Hydrolys (FEEDING SUPPLEMENT, PRO-STAT SUGAR FREE 64,) LIQD Take 30 mLs by mouth 2 (two) times daily. Patient not taking: Reported on 10/14/2017 09/28/17   Charolette Forward, MD  ferrous sulfate 325 (65 FE) MG tablet Take 1 tablet (325 mg total) by mouth 3 (three) times daily with meals. Patient not taking: Reported on 10/14/2017 09/28/17   Charolette Forward, MD  HYDROcodone-acetaminophen (NORCO) 5-325 MG tablet Take 1 tablet by mouth every 4 (four) hours as needed for severe pain. 10/14/17   Sherwood Gambler, MD  Multiple Vitamin (MULTIVITAMIN WITH MINERALS) TABS tablet Take 1 tablet by mouth daily. Patient not taking: Reported on 10/14/2017 09/28/17   Charolette Forward, MD  Spacer/Aero-Holding Chambers (AEROCHAMBER PLUS WITH MASK) inhaler Use as instructed 11/21/15   Noe Gens, PA-C    Family History Family History  Problem Relation Age of Onset  . Asthma Mother   . Alcohol abuse Father     Social History Social History   Tobacco Use  . Smoking status: Never Smoker  . Smokeless tobacco: Never Used  Substance Use Topics  . Alcohol use: No  . Drug use: No     Allergies   Adhesive [tape] and Penicillins   Review of Systems Review of Systems  Respiratory: Negative for shortness of breath.   Cardiovascular: Positive for chest pain and leg swelling.  Gastrointestinal: Positive for nausea (last night, now resolved). Negative for abdominal pain.  All other systems reviewed and are negative.    Physical Exam Updated Vital Signs BP (!) 149/88   Pulse 80   Temp 99.6 F (37.6 C) (Oral)   Resp 20   SpO2 98%   Physical Exam  Constitutional: She is oriented to person, place, and time. She appears well-developed and well-nourished.  Non-toxic appearance. She does not appear ill. No distress.  HENT:  Head: Normocephalic and atraumatic.  Right Ear: External ear  normal.  Left Ear: External ear normal.  Nose: Nose normal.  Eyes: Right eye exhibits no discharge. Left eye exhibits no discharge.  Cardiovascular: Normal rate, regular rhythm and normal heart sounds.  Pulmonary/Chest: Effort normal and breath sounds normal. She exhibits tenderness.    Abdominal: Soft. There is no tenderness.  Neurological: She is alert and oriented to person, place, and time.  Skin: Skin is warm and dry.  Nursing note and vitals reviewed.    ED Treatments / Results  Labs (all labs ordered are listed, but only abnormal results are displayed) Labs Reviewed  BASIC METABOLIC PANEL - Abnormal; Notable for the following components:      Result Value   Glucose, Bld 130 (*)    All other components within normal limits  CBC -  Abnormal; Notable for the following components:   Hemoglobin 9.6 (*)    HCT 32.6 (*)    MCH 23.2 (*)    MCHC 29.4 (*)    RDW 19.0 (*)    All other components within normal limits  I-STAT TROPONIN, ED  I-STAT TROPONIN, ED    EKG None  Radiology Dg Chest 2 View  Result Date: 10/14/2017 CLINICAL DATA:  72 year old female with 2 days of left side chest pain. Symptoms are worse at night. EXAM: CHEST - 2 VIEW COMPARISON:  09/25/2017 chest radiographs and earlier. FINDINGS: No significant improvement in left lung base ventilation since the abnormal radiographs in September. The left lung base was clear on 2017 radiographs. There is patchy and confluent opacity which most resembles a small left pleural effusion and nonspecific superimposed airspace opacity. At the same time patchy right lung opacity has resolved. Questionable smaller right pleural effusion in September may have resolved. EKG button artifact in the upper lobes. Cardiomegaly appears mildly progressed since 2017. Prior sternotomy and cardiac valve replacement. Other mediastinal contours are within normal limits. No pneumothorax or pulmonary edema. No acute osseous abnormality identified.  Negative visible bowel gas pattern. IMPRESSION: 1. Abnormal left lung base with no improvement since 09/25/2017. The appearance most resembles a small left pleural effusion with superimposed nonspecific airspace disease. 2. Patchy right lung opacity and possible small right pleural effusion have resolved since the prior. 3. Cardiomegaly which may have increased since 2017. Consider the possibility of pericardial effusion. Electronically Signed   By: Genevie Ann M.D.   On: 10/14/2017 14:52    Procedures Procedures (including critical care time)  Medications Ordered in ED Medications  acetaminophen (TYLENOL) tablet 650 mg (650 mg Oral Given 10/14/17 1826)     Initial Impression / Assessment and Plan / ED Course  I have reviewed the triage vital signs and the nursing notes.  Pertinent labs & imaging results that were available during my care of the patient were reviewed by me and considered in my medical decision making (see chart for details).     Patient presents with atypical chest pain for about a month although worse since last night.  It is reproducible.  She has some known coronary issues and has had a prior cardiac valve replacement.  She tells me she is had a CABG although her cardiologist states this was not true.  Either way, I do not think ACS is the cause here.  Given she is been taking Tylenol around-the-clock with no improvement and is unable to take NSAIDs I will give her a short course of hydrocodone for severe breakthrough pain.  Otherwise, I doubt PE, dissection, ACS.  The abnormal chest x-ray findings from previous are improved and she has no clinical signs or symptoms of fluid overload.  She appears stable for discharge home after discussion with her cardiologist, Dr. Terrence Dupont, who will see her in clinic next week.  CHA2DS2/VAS Stroke Risk Points  Current as of 42 minutes ago     5 >= 2 Points: High Risk  1 - 1.99 Points: Medium Risk  0 Points: Low Risk    The patient's score has  not changed in the past year.:  No Change     Details    This score determines the patient's risk of having a stroke if the  patient has atrial fibrillation.       Points Metrics  1 Has Congestive Heart Failure:  Yes    Current as of 42 minutes  ago  0 Has Vascular Disease:  No    Current as of 42 minutes ago  1 Has Hypertension:  Yes    Current as of 42 minutes ago  1 Age:  44    Current as of 42 minutes ago  1 Has Diabetes:  Yes    Current as of 42 minutes ago  0 Had Stroke:  No  Had TIA:  No  Had thromboembolism:  No    Current as of 42 minutes ago  1 Female:  Yes    Current as of 42 minutes ago           Final Clinical Impressions(s) / ED Diagnoses   Final diagnoses:  Chest wall pain    ED Discharge Orders         Ordered    HYDROcodone-acetaminophen (NORCO) 5-325 MG tablet  Every 4 hours PRN     10/14/17 1930           Sherwood Gambler, MD 10/14/17 2152

## 2017-10-14 NOTE — ED Notes (Signed)
Pt now states that her chest is hurting unsure if it is related to the surgery or not. ekg obtained at this time.

## 2017-10-14 NOTE — ED Triage Notes (Signed)
Pt reports having open heart surgery in June. She is having worsening pain from the surgery that is keeping her from sleeping.

## 2017-10-14 NOTE — Discharge Instructions (Addendum)
GO TO ER

## 2017-10-27 ENCOUNTER — Encounter (HOSPITAL_COMMUNITY): Payer: Self-pay | Admitting: Emergency Medicine

## 2017-10-27 ENCOUNTER — Emergency Department (HOSPITAL_COMMUNITY): Payer: Medicare Other

## 2017-10-27 ENCOUNTER — Emergency Department (HOSPITAL_COMMUNITY)
Admission: EM | Admit: 2017-10-27 | Discharge: 2017-10-30 | Disposition: A | Discharge status: Home / Self Care | Payer: Medicare Other | Attending: Emergency Medicine | Admitting: Emergency Medicine

## 2017-10-27 ENCOUNTER — Other Ambulatory Visit: Payer: Self-pay

## 2017-10-27 DIAGNOSIS — I10 Essential (primary) hypertension: Secondary | ICD-10-CM | POA: Insufficient documentation

## 2017-10-27 DIAGNOSIS — Z951 Presence of aortocoronary bypass graft: Secondary | ICD-10-CM | POA: Insufficient documentation

## 2017-10-27 DIAGNOSIS — E119 Type 2 diabetes mellitus without complications: Secondary | ICD-10-CM | POA: Insufficient documentation

## 2017-10-27 DIAGNOSIS — R0789 Other chest pain: Secondary | ICD-10-CM

## 2017-10-27 DIAGNOSIS — J90 Pleural effusion, not elsewhere classified: Secondary | ICD-10-CM | POA: Insufficient documentation

## 2017-10-27 DIAGNOSIS — I2510 Atherosclerotic heart disease of native coronary artery without angina pectoris: Secondary | ICD-10-CM | POA: Diagnosis not present

## 2017-10-27 DIAGNOSIS — J45909 Unspecified asthma, uncomplicated: Secondary | ICD-10-CM | POA: Diagnosis not present

## 2017-10-27 DIAGNOSIS — I5023 Acute on chronic systolic (congestive) heart failure: Secondary | ICD-10-CM | POA: Insufficient documentation

## 2017-10-27 DIAGNOSIS — Z7982 Long term (current) use of aspirin: Secondary | ICD-10-CM | POA: Insufficient documentation

## 2017-10-27 DIAGNOSIS — F419 Anxiety disorder, unspecified: Secondary | ICD-10-CM | POA: Diagnosis not present

## 2017-10-27 DIAGNOSIS — Z79899 Other long term (current) drug therapy: Secondary | ICD-10-CM | POA: Diagnosis not present

## 2017-10-27 DIAGNOSIS — Z7984 Long term (current) use of oral hypoglycemic drugs: Secondary | ICD-10-CM | POA: Diagnosis not present

## 2017-10-27 LAB — I-STAT TROPONIN, ED: Troponin i, poc: 0 ng/mL (ref 0.00–0.08)

## 2017-10-27 LAB — BASIC METABOLIC PANEL
Anion gap: 9 (ref 5–15)
BUN: 18 mg/dL (ref 8–23)
CHLORIDE: 106 mmol/L (ref 98–111)
CO2: 21 mmol/L — AB (ref 22–32)
CREATININE: 0.88 mg/dL (ref 0.44–1.00)
Calcium: 9.3 mg/dL (ref 8.9–10.3)
GFR calc Af Amer: >60 mL/min (ref >60)
GFR calc non Af Amer: >60 mL/min (ref >60)
Glucose, Bld: 120 mg/dL — ABNORMAL HIGH (ref 70–99)
POTASSIUM: 3.8 mmol/L (ref 3.5–5.1)
SODIUM: 136 mmol/L (ref 135–145)

## 2017-10-27 LAB — CBC
HEMATOCRIT: 31.9 % — AB (ref 36.0–46.0)
Hemoglobin: 9.3 g/dL — ABNORMAL LOW (ref 12.0–15.0)
MCH: 22.7 pg — ABNORMAL LOW (ref 26.0–34.0)
MCHC: 29.2 g/dL — AB (ref 30.0–36.0)
MCV: 77.8 fL — AB (ref 80.0–100.0)
Platelets: 208 10*3/uL (ref 150–400)
RBC: 4.1 MIL/uL (ref 3.87–5.11)
RDW: 19.9 % — ABNORMAL HIGH (ref 11.5–15.5)
WBC: 6.2 10*3/uL (ref 4.0–10.5)
nRBC: 0 % (ref 0.0–0.2)

## 2017-10-27 MED ORDER — ASPIRIN EC 81 MG PO TBEC
81.0000 mg | DELAYED_RELEASE_TABLET | Freq: Every day | ORAL | Status: DC
  Administered 2017-10-27 – 2017-10-28 (×2): 81 mg via ORAL
  Filled 2017-10-27 (×3): qty 1

## 2017-10-27 MED ORDER — ALBUTEROL SULFATE HFA 108 (90 BASE) MCG/ACT IN AERS: 2.0000 | INHALATION_SPRAY | Freq: Four times a day (QID) | RESPIRATORY_TRACT | Status: DC | PRN

## 2017-10-27 MED ORDER — APIXABAN 5 MG PO TABS
5.0000 mg | ORAL_TABLET | Freq: Two times a day (BID) | ORAL | Status: DC
  Administered 2017-10-27 – 2017-10-30 (×5): 5 mg via ORAL
  Filled 2017-10-27 (×7): qty 1

## 2017-10-27 MED ORDER — ZOLPIDEM TARTRATE 5 MG PO TABS
5.0000 mg | ORAL_TABLET | Freq: Every evening | ORAL | Status: DC | PRN
  Administered 2017-10-27 – 2017-10-29 (×2): 5 mg via ORAL
  Filled 2017-10-27 (×2): qty 1

## 2017-10-27 MED ORDER — LORAZEPAM 1 MG PO TABS
0.5000 mg | ORAL_TABLET | Freq: Once | ORAL | Status: AC
  Administered 2017-10-27: 0.5 mg via ORAL
  Filled 2017-10-27: qty 1

## 2017-10-27 MED ORDER — HYDROCODONE-ACETAMINOPHEN 5-325 MG PO TABS
1.0000 | ORAL_TABLET | Freq: Once | ORAL | Status: AC
  Administered 2017-10-27: 1 via ORAL
  Filled 2017-10-27: qty 1

## 2017-10-27 MED ORDER — ADULT MULTIVITAMIN W/MINERALS CH
1.0000 | ORAL_TABLET | Freq: Every day | ORAL | Status: DC
  Administered 2017-10-29 – 2017-10-30 (×2): 1 via ORAL
  Filled 2017-10-27 (×2): qty 1

## 2017-10-27 MED ORDER — FAMOTIDINE 20 MG PO TABS
20.0000 mg | ORAL_TABLET | Freq: Every day | ORAL | Status: DC
  Administered 2017-10-28 – 2017-10-30 (×3): 20 mg via ORAL
  Filled 2017-10-27 (×3): qty 1

## 2017-10-27 MED ORDER — METFORMIN HCL ER 500 MG PO TB24
500.0000 mg | ORAL_TABLET | Freq: Two times a day (BID) | ORAL | Status: DC
  Administered 2017-10-27 – 2017-10-30 (×6): 500 mg via ORAL
  Filled 2017-10-27 (×7): qty 1

## 2017-10-27 MED ORDER — METOPROLOL SUCCINATE ER 25 MG PO TB24
50.0000 mg | ORAL_TABLET | Freq: Every day | ORAL | Status: DC
  Administered 2017-10-29 – 2017-10-30 (×2): 50 mg via ORAL
  Filled 2017-10-27: qty 1
  Filled 2017-10-27 (×2): qty 2

## 2017-10-27 MED ORDER — FUROSEMIDE 20 MG PO TABS
20.0000 mg | ORAL_TABLET | Freq: Two times a day (BID) | ORAL | Status: DC
  Administered 2017-10-28 – 2017-10-30 (×4): 20 mg via ORAL
  Filled 2017-10-27 (×5): qty 1

## 2017-10-27 NOTE — Progress Notes (Addendum)
CSW met with pt at bedside. Per pt, pt has been living in her car for the past two weeks or so, whenever she was discharged from the hospital. Pt stated she was able to stay at her church the last two nights. Pt believed that she was getting into [employer] today, so she left the church. The church was only a temporary situation. The pt stated that her doctor sent her an FL-2 and worked with [employer] care and Medicaid for payment. Pt said she went to [facility] multiple times to fill out paperwork and sign things. When pt went today, the nursing home stated that they would need $776 dollars in order for pt to stay there. At this point, pt only has $500. Pt receives about $1,000 a month in social security, but had to spend a lot of it on food and surviving in her car. After hearing this from [employer], pt went to [employer] to see what assistance they could provide. While at [employer], pt became upset and her chest began to hurt, so [facility] called for EMS to bring pt to the hospital. Pt reports that her car is at [employer].   Pt recently returned from staying with her daughter in [place]. Pt left her apartment in [place] about 7 months to go stay with her daughter in [place] because of an incident that occurred at her apartment. Pieces of her ceiling fell on top of her. When she was in [place], pt became sick and had to undergo surgery. While she was recovering, she was unable to pay her bills for her apartment. When she returned about a month ago, pt had been evicted from her apartment. Pt has a son in [place], but she cannot stay with him because he lives in a one bedroom apartment. Pt stated that none of her children would be able to help care for her.    Pt informed CSW that when she attempts to walk with her walker she becomes out of breath very quickly and has difficulty breathing. Pt stated that she is in a lot of pain during the conversation.   During this  conversation, pt stated multiple times that she applied for long term medicaid at [facility]. Pt stated that she received long term medicaid from [place]. Pt does not have her card with her. Pt is looking for assistance in getting to a rehab or ALF, where she can recover and/ or live because she feels she is not safe to live on her own now.   Pt's Medicaid Number is [id number] T.   Plan: CSW reached out to [employer] about pt's situation. CSW refered pt for [employer]. CSW completed FL-2 for ALF beds and faxed. PT consult placed. Pending PT disposition tomorrow, CSW will leave handoff for daytime CSW to follow up about SNF FL-2, if needed.  CSW will leave handoff for CSW to follow up with [facility] care in the morning to see why pt was unable to go unless she paid $776. [facility] requested PASSR for pt today, 10/17.   Wendelyn Breslow, [facility]  [phone]

## 2017-10-27 NOTE — ED Provider Notes (Signed)
[facility] EMERGENCY DEPARTMENT Provider Note   CSN: [id number] Arrival date & time: 10/27/17  1756     History   Chief Complaint Chief Complaint  Patient presents with  . Chest Pain  . Anxiety    HPI Shericka Johnstone is a 72 y.o. female.  Pt presents to the ED today with CP.  The pt said she had to leave her apartment because it was not safe.  She left all her belongings there.  She has been living in her car staying in the parking lot at [facility] for the last 10 days while waiting to get into a SNF.  The pt's doctor has done all the paperwork and she had a bed scheduled for today.  She said she arrived at the SNF, but did not have the money they requested.  She said she was turned away.  She was very upset and went to the [employer] to see if they could help her.  She said she developed cp while there.  CP has improved, but it is still there.  EMS did give her 325 mg asa and 1 nitro prior to arrival here.  The pt points to pain in her upper right chest.  Pt said she has no family here.     Past Medical History:  Diagnosis Date  . Asthma   . Coronary artery disease   . Diabetes mellitus   . Heart palpitations   . Hypertension     Patient Active Problem List   Diagnosis Date Noted  . Acute on chronic left systolic heart failure ([place]) 09/25/2017  . Heart palpitations 06/02/2016  . Acute on chronic respiratory failure with hypoxia ([place]) 03/20/2015  . Asthma exacerbation 03/20/2015  . Laryngitis, acute 03/20/2015  . Thrombocytopenia ([place]) 03/20/2015  . Diabetes mellitus type 2, controlled ([place]) 03/20/2015  . HTN (hypertension) 03/20/2015  . S/P CABG (coronary artery bypass graft) 03/20/2015    Past Surgical History:  Procedure Laterality Date  . CESAREAN SECTION    . CORONARY ARTERY BYPASS GRAFT    . ESOPHAGEAL MANOMETRY N/A 03/06/2012   Procedure: ESOPHAGEAL MANOMETRY (EM);  Surgeon: Beryle Beams, MD;  Location: [facility];  Service:  Endoscopy;  Laterality: N/A;  . TONSILLECTOMY       OB History   None      Home Medications    Prior to Admission medications   Medication Sig Start Date End Date Taking? Authorizing Provider  acetaminophen (TYLENOL) 500 MG tablet Take 2,000 mg by mouth every 6 (six) hours as needed (for pain).    [provider]  albuterol (PROVENTIL HFA;VENTOLIN HFA) 108 (90 BASE) MCG/ACT inhaler Inhale 2 puffs into the lungs every 6 (six) hours as needed for wheezing.     [provider]  albuterol (PROVENTIL) (2.5 MG/3ML) 0.083% nebulizer solution Take 3 mLs (2.5 mg total) by nebulization every 6 (six) hours as needed for wheezing or shortness of breath. Patient not taking: Reported on 10/14/2017 11/21/15   Noe Gens, PA-C  Amino Acids-Protein Hydrolys (FEEDING SUPPLEMENT, PRO-STAT SUGAR FREE 64,) LIQD Take 30 mLs by mouth 2 (two) times daily. Patient not taking: Reported on 10/14/2017 09/28/17   Charolette Forward, MD  apixaban (ELIQUIS) 5 MG TABS tablet Take 1 tablet (5 mg total) by mouth 2 (two) times daily. 09/28/17   Charolette Forward, MD  aspirin EC 81 MG tablet Take 81 mg by mouth at bedtime.    [provider]  famotidine (PEPCID) 20 MG  tablet Take 20 mg by mouth daily before breakfast.  02/04/15   [provider]  ferrous sulfate 325 (65 FE) MG tablet Take 1 tablet (325 mg total) by mouth 3 (three) times daily with meals. Patient not taking: Reported on 10/14/2017 09/28/17   Charolette Forward, MD  fluticasone [facility]) 50 MCG/ACT nasal spray Place 2 sprays into both nostrils 2 (two) times daily. Patient taking differently: Place 2 sprays into both nostrils as needed for allergies or rhinitis.  10/25/15   Jacqualine Mau, NP  furosemide (LASIX) 20 MG tablet Take 2 tablets (40 mg total) by mouth daily. Patient taking differently: Take 20 mg by mouth 2 (two) times daily.  09/28/17   Charolette Forward, MD  HYDROcodone-acetaminophen (NORCO) 5-325 MG tablet Take 1 tablet  by mouth every 4 (four) hours as needed for severe pain. 10/14/17   Sherwood Gambler, MD  metFORMIN (GLUCOPHAGE XR) 500 MG 24 hr tablet Take 1 tablet (500 mg total) by mouth daily with breakfast. Patient taking differently: Take 500 mg by mouth 2 (two) times daily.  06/02/16   Golden Circle, FNP  metoprolol succinate (TOPROL-XL) 50 MG 24 hr tablet Take 50 mg by mouth daily. Take with or immediately following a meal.    [provider]  Multiple Vitamin (MULTIVITAMIN WITH MINERALS) TABS tablet Take 1 tablet by mouth daily. Patient not taking: Reported on 10/14/2017 09/28/17   Charolette Forward, MD  Multiple Vitamins-Minerals (ADULT ONE DAILY GUMMIES) CHEW Chew 1 tablet by mouth 2 (two) times a week.    [provider]  nitroGLYCERIN (NITROSTAT) 0.4 MG SL tablet Place 0.4 mg under the tongue every 5 (five) minutes as needed for chest pain.     [provider]  Spacer/Aero-Holding Chambers (AEROCHAMBER PLUS WITH MASK) inhaler Use as instructed 11/21/15   Noe Gens, PA-C  zolpidem (AMBIEN) 5 MG tablet Take 5 mg by mouth at bedtime as needed for sleep.    [provider]    Family History Family History  Problem Relation Age of Onset  . Asthma Mother   . Alcohol abuse Father     Social History Social History   Tobacco Use  . Smoking status: Never Smoker  . Smokeless tobacco: Never Used  Substance Use Topics  . Alcohol use: No  . Drug use: No     Allergies   Adhesive [tape] and Penicillins   Review of Systems Review of Systems  Cardiovascular: Positive for chest pain.  All other systems reviewed and are negative.    Physical Exam Updated Vital Signs BP (!) 174/83 (BP Location: Right Arm)   Pulse 61   Temp 98.5 F (36.9 C) (Oral)   Resp 16   Ht _0  (1.575 m)   Wt 64 kg   SpO2 95%   BMI 25.79 kg/m   Physical Exam  Constitutional: She is oriented to person, place, and time. She appears well-developed and well-nourished.  HENT:    Head: Normocephalic and atraumatic.  Eyes: Pupils are equal, round, and reactive to light. EOM are normal.  Neck: Normal range of motion. Neck supple.  Cardiovascular: Normal rate, regular rhythm, intact distal pulses and normal pulses.  Pulmonary/Chest: Effort normal and breath sounds normal.    Abdominal: Soft. Bowel sounds are normal.  Musculoskeletal: Normal range of motion.       Right lower leg: She exhibits edema.       Left lower leg: She exhibits edema.  Neurological: She is alert and oriented  to person, place, and time.  Skin: Skin is warm and dry. Capillary refill takes less than 2 seconds.  Psychiatric: She has a normal mood and affect. Her behavior is normal.  Nursing note and vitals reviewed.    ED Treatments / Results  Labs (all labs ordered are listed, but only abnormal results are displayed) Labs Reviewed  BASIC METABOLIC PANEL - Abnormal; Notable for the following components:      Result Value   CO2 21 (*)    Glucose, Bld 120 (*)    All other components within normal limits  CBC - Abnormal; Notable for the following components:   Hemoglobin 9.3 (*)    HCT 31.9 (*)    MCV 77.8 (*)    MCH 22.7 (*)    MCHC 29.2 (*)    RDW 19.9 (*)    All other components within normal limits  I-STAT TROPONIN, ED    EKG EKG Interpretation  Date/Time:  Thursday October 27 2017 18:05:09 EDT Ventricular Rate:  64 PR Interval:    QRS Duration: 82 QT Interval:  472 QTC Calculation: 486 R Axis:   79 Text Interpretation:  Atrial fibrillation with premature ventricular or aberrantly conducted complexes Cannot rule out Anterior infarct , age undetermined Abnormal ECG No significant change since last tracing Confirmed by Isla Pence [phone]) on 10/27/2017 8:54:37 PM   Radiology Dg Chest 2 View  Result Date: 10/27/2017 CLINICAL DATA:  Chest pain since surgery in July. EXAM: CHEST - 2 VIEW COMPARISON:  10/14/2017 FINDINGS: Previous median sternotomy. Mitral valve  replacement. Redemonstration of a left effusion, which may be slightly larger than seen previously, with associated mild left base atelectasis. No pleural fluid seen on the right. Mild left base atelectasis related to the fusion. IMPRESSION: Persistent and possibly slightly larger left effusion with left base atelectasis. Electronically Signed   By: Nelson Chimes M.D.   On: 10/27/2017 19:36    Procedures Procedures (including critical care time)  Medications Ordered in ED Medications  LORazepam (ATIVAN) tablet 0.5 mg (0.5 mg Oral Given 10/27/17 2143)  HYDROcodone-acetaminophen (NORCO/VICODIN) 5-325 MG per tablet 1 tablet (1 tablet Oral Given 10/27/17 2143)     Initial Impression / Assessment and Plan / ED Course  I have reviewed the triage vital signs and the nursing notes.  Pertinent labs & imaging results that were available during my care of the patient were reviewed by me and considered in my medical decision making (see chart for details).     CP is pleuritic in nature.  Initial cardiac w/u negative.  Pt d/w SW regarding unfortunate social situation.  It is unclear exactly what happened to the nursing home situation today.  The pt is willing to go anywhere.  The SW will try to get to the bottom of things and get pt into a safe living situation.  It is not safe to let patient out to go live in her car in a [employer] parking lot when the assisted living situation is up in the air.  We will board her overnight and hopefully get her to a facility soon.    Final Clinical Impressions(s) / ED Diagnoses   Final diagnoses:  Pleural effusion on left  Chest wall pain  Anxiety    ED Discharge Orders    None       Isla Pence, MD 10/27/17 2206

## 2017-10-27 NOTE — ED Triage Notes (Signed)
Pt coming from [facility] by ems, had received a phone call from a nursing facility reporting that she would unable to be accepted right now. Pt then started having central chest pressure with sob and nausea. Denies radiation. Hx of valve replacement and bypass. Pt took 325 of asa prior to ems arrival. EMS gave 1 nitro with relief. Denies pain at this time.   EMS VS BP 160/84, P 78, 95% room air. CBG 133.

## 2017-10-27 NOTE — Progress Notes (Addendum)
CSW received phone call from [employer]. [facility] is able to come out tomorrow morning at 8 am to assess if pt is appropriate for their facility.   Wendelyn Breslow, [facility]  [phone]

## 2017-10-28 DIAGNOSIS — J90 Pleural effusion, not elsewhere classified: Secondary | ICD-10-CM | POA: Diagnosis not present

## 2017-10-28 NOTE — ED Notes (Addendum)
Pt refusing for vital signs.

## 2017-10-28 NOTE — ED Notes (Signed)
Patient very upset with this RN and stated "I am very upset that I did not get my lunch tray on time. The patient is supposed to be first and I did not get my medications on time either." This RN explained to the patient that the medications were not in the pyxis and that pharmacy had to be consulted to obtain the ordered medications. Patient still very upset and states "I want to speak to your manager." Charge nurse made aware of situation.

## 2017-10-28 NOTE — Evaluation (Signed)
Physical Therapy Evaluation Patient Details Name: Sharon Spears MRN: [id number] DOB: 20-Jun-1945 Today's Date: 10/28/2017   History of Present Illness  Pt is a 72 y/o female presenting to the ED from the [facility] secondary to chest pain. Per notes, cardiac workup negative and more pleuritic in nature. PMH includes HTN, DM, CAD s/p CABG.   Clinical Impression  Pt presenting with problem above with deficits below. Pt unsteady, especially with dynamic gait tasks, and requiring min A for steadying. Pt unable to dual task when performing vertical head turns. Pt also limited by fatigue. Feel pt is at increased risk for falls and currently has no family to assist her. Feel pt would benefit from short term SNF to increase independence and safety with mobility. Will continue to follow acutely.     Follow Up Recommendations SNF;Supervision/Assistance - 24 hour    Equipment Recommendations  None recommended by PT    Recommendations for Other Services       Precautions / Restrictions Precautions Precautions: Fall Restrictions Weight Bearing Restrictions: No      Mobility  Bed Mobility Overal bed mobility: Needs Assistance Bed Mobility: Supine to Sit     Supine to sit: Supervision     General bed mobility comments: Supervision for safety.   Transfers Overall transfer level: Needs assistance Equipment used: None Transfers: Sit to/from Stand Sit to Stand: Min guard         General transfer comment: Min guard for safety.   Ambulation/Gait Ambulation/Gait assistance: Min assist Gait Distance (Feet): 50 Feet(50' and 30' ) Assistive device: None Gait Pattern/deviations: Step-through pattern;Decreased stride length;Drifts right/left Gait velocity: Decreased    General Gait Details: Pt unsteady and reaching out for railing in hallway to ambulate into bathroom. Required standing rest after ambulating to bathroom secondary to fatigue and SOB. Pt unsteady when performing  horizontal head turns during gait and required min A to maintain balance. Pt unable to dual task to perform vertical head turns, as she would stop and look up and then look ahead and keep walking.   Stairs            Wheelchair Mobility    Modified Rankin (Stroke Patients Only)       Balance Overall balance assessment: Needs assistance Sitting-balance support: No upper extremity supported;Feet supported Sitting balance-Leahy Scale: Fair     Standing balance support: No upper extremity supported;During functional activity Standing balance-Leahy Scale: Poor Standing balance comment: Reliant on external support.                  Standardized Balance Assessment Standardized Balance Assessment : Dynamic Gait Index   Dynamic Gait Index Level Surface: Moderate Impairment Gait with Horizontal Head Turns: Severe Impairment Gait with Vertical Head Turns: Severe Impairment       Pertinent Vitals/Pain Pain Assessment: No/denies pain    Home Living Family/patient expects to be discharged to:: Unsure                 Additional Comments: Per notes and CSW, pt has been living out of her car. Pt has no family to assist her at d/c.     Prior Function Level of Independence: Independent with assistive device(s)         Comments: Reports using RW, however, pt did not have RW with her.      Hand Dominance        Extremity/Trunk Assessment   Upper Extremity Assessment Upper Extremity Assessment: Generalized weakness    Lower Extremity  Assessment Lower Extremity Assessment: Generalized weakness    Cervical / Trunk Assessment Cervical / Trunk Assessment: Normal  Communication   Communication: No difficulties  Cognition Arousal/Alertness: Awake/alert Behavior During Therapy: WFL for tasks assessed/performed Overall Cognitive Status: No family/caregiver present to determine baseline cognitive functioning                                  General Comments: Pt easily distractable during gait and dynamic gait tasks. Pt also with decreased awareness of safety as she was attempting to get out of her bed without assistance.       General Comments General comments (skin integrity, edema, etc.): Pt reports no family available to assist with mobility. Educated about importance of calling for help prior to getting up.    Exercises     Assessment/Plan    PT Assessment Patient needs continued PT services  PT Problem List Decreased strength;Decreased activity tolerance;Decreased balance;Decreased mobility;Decreased safety awareness;Decreased knowledge of precautions       PT Treatment Interventions DME instruction;Gait training;Functional mobility training;Balance training;Therapeutic activities;Therapeutic exercise;Patient/family education    PT Goals (Current goals can be found in the Care Plan section)  Acute Rehab PT Goals Patient Stated Goal: to go to SNF to get better  PT Goal Formulation: With patient Time For Goal Achievement: 11/11/17 Potential to Achieve Goals: Fair    Frequency Min 2X/week   Barriers to discharge Decreased caregiver support;Inaccessible home environment      Co-evaluation               AM-PAC PT "6 Clicks" Daily Activity  Outcome Measure Difficulty turning over in bed (including adjusting bedclothes, sheets and blankets)?: A Little Difficulty moving from lying on back to sitting on the side of the bed? : A Lot Difficulty sitting down on and standing up from a chair with arms (e.g., wheelchair, bedside commode, etc,.)?: Unable Help needed moving to and from a bed to chair (including a wheelchair)?: A Little Help needed walking in hospital room?: A Little Help needed climbing 3-5 steps with a railing? : A Lot 6 Click Score: 14    End of Session   Activity Tolerance: Patient limited by fatigue Patient left: in bed;with call bell/phone within reach Nurse Communication: Mobility  status PT Visit Diagnosis: Unsteadiness on feet (R26.81);Muscle weakness (generalized) (M62.81)    Time: 5271-2929 PT Time Calculation (min) (ACUTE ONLY): 11 min   Charges:   PT Evaluation $PT Eval Low Complexity: [place], PT, DPT  Acute Rehabilitation Services  Pager: [phone] Office: [phone]   Rudean Hitt 10/28/2017, 11:22 AM

## 2017-10-28 NOTE — ED Notes (Signed)
Ordered breakfast tray

## 2017-10-28 NOTE — Progress Notes (Signed)
[name] from [employer] informed CSW that authorization from [employer] has not been received yet.   CSW will leave handoff for weekend social work to continue to follow up with [facility].   Wendelyn Breslow, [facility]  [phone]

## 2017-10-28 NOTE — ED Notes (Signed)
Physical therapy at the bedside.

## 2017-10-28 NOTE — ED Notes (Signed)
Pt given breakfast.

## 2017-10-28 NOTE — ED Notes (Signed)
This tech attempted to assess patients vital signs. Patient refused.

## 2017-10-28 NOTE — Progress Notes (Addendum)
11:50am-CSW has sent PT notes to [name] with [facility] through the hub at this time. CSw will follow up with authorization at [facility] once received. CSW has made staff aware that pt may have to bored over night if authorization hasn't been received by end of the day. CSW will continue to follow.   11:23am-CSW aware that PT has seen pt and are recommending SNF placement for pt at this time. CSW awaits PT note at this time so that CSW can send over PT evaluation to [facility] so that they can began authorization for pt.  8:23am-CSW spoke with Corinne Ports in the rehab department and was informed that [place] is scheduled to see this pt today.  8:17am-CSW spoke with [name] from [employer] regarding pt. CSW was informed that on 10/27/17, [name] submitted for insurance authorization with [employer] to see if pt could get authorized for SNF placement. CSW was informed that [facility] denied pt at that time as [facility] had no further information (PT notes) that expressed a need for SNF placement. CSW advised [name] that PT has been order for pt and once they see and evaluate pt, CSW would send that information over to her for authorization. CSW   CSW sought further details on if [facility] was to deny pt again then could [facility] use pt's Medicaid to admit pt to the facility. [name] expressed that she would be talking with the business office at [facility] to see what they can work out with pt. [name] expressed that if they were able to take pt on Medicaid then pt would need to give the $500 that pt does have to the facility and that when pt's check comes in November that as well would need to be given to the facility. Pt expressed being agreeable and "I dont need anything but a place to stay. I will give them whatever/everything". CSW has followed up with PT to ensure that pt is on the list to be seen ASAP today.  7:22am-CSW went to speak with pt at bedside to introduce self as well as to give updates at this time. Pt in the restroom therefore CSW  unable to speak with pt. CSW spoke with RN concerning [facility] coming to see pt-CSW expressed that CSW would follow up with that once [facility] is open. CSW gave RN POSSIBLE cause of [facility] asking for money from pt. CSW aware that pt has Medicaid however in the past CSW expressed that per [facility] doesn't pay for therapies only room and bored and if pt is not agreeable to stay at the facility for 30 days then no facility would get paid-reason being [facility] possibly asked for that amount of money up front.    CSW will follow up with [name] from [facility] to gather exact reason for the need of this amount of money up front.    Virgie Dad Nan Maya, MSW, [facility] Clinical Social Worker [phone]

## 2017-10-28 NOTE — ED Notes (Signed)
Pt. Moved from room to hallway bed. Discussed with patient about staying in her bed. Patient argumentative with this tech and continues to wander into other areas of the ED.

## 2017-10-28 NOTE — ED Notes (Signed)
Patient states "I already took my metoprolol and eliquis without telling you because you were taking too long to get my medications." This RN informed the patient that the medications had to come from the pharmacy and RN apologized for the delay.

## 2017-10-28 NOTE — ED Notes (Signed)
Lunch ordered for patient at 1310. Pt aware.

## 2017-10-28 NOTE — ED Notes (Signed)
Pt is asleep and resting comfortably.

## 2017-10-29 DIAGNOSIS — J90 Pleural effusion, not elsewhere classified: Secondary | ICD-10-CM | POA: Diagnosis not present

## 2017-10-29 LAB — CBG MONITORING, ED: GLUCOSE-CAPILLARY: 106 mg/dL — AB (ref 70–99)

## 2017-10-29 MED ORDER — ACETAMINOPHEN 325 MG PO TABS
650.0000 mg | ORAL_TABLET | Freq: Once | ORAL | Status: AC
  Administered 2017-10-29: 650 mg via ORAL
  Filled 2017-10-29: qty 2

## 2017-10-29 NOTE — ED Notes (Signed)
While checking pt blood sugar she grabs at chest but when asked how she feels, she states "I'm ok"

## 2017-10-29 NOTE — ED Triage Notes (Signed)
Dinner ordered

## 2017-10-29 NOTE — ED Notes (Signed)
[employer] modified tray called in @ 9:55am for lunch

## 2017-10-29 NOTE — ED Notes (Signed)
Carb modified tray ordered for breakfast

## 2017-10-29 NOTE — Progress Notes (Addendum)
CSW called [employer] and was directed to RN on [address].  No one available.  Receptionist took message and said she will have someone call back with update. Winferd Humphrey, MSW, LCSW Clinical Social Worker 10/29/2017 8:53 AM   CSW spoke to Cheron Every who reports that [facility] has again denied authorization for pt after they submitted the PT notes.  [name] has spoken to the business office regarding other payment arrangements and they have told her that they would take pt on November 1 after she receives her check and is able to pay the full amount needed for the month but they cannot take her currently with the funds that she has.  Pt has spoken to [name] before and can contact her a few days prior to Nov 1 if she is still in need of placement. Winferd Humphrey, MSW, LCSW Clinical Social Worker 10/29/2017 9:07 AM   CSW spoke with pt regarding insurance denial.  Pt states that her son in [place] is going to give her roughly $500 to make up the difference so she can go to [facility].  Pt phone is dead but once recharged, she can check if the money is in there.  CSW spoke to [name] again who said this could possibly work and to text her when we have verified that she has the money.   Winferd Humphrey, MSW, LCSW Clinical Social Worker 10/29/2017 9:51 AM   Pt has $1119 in her bank account, verified by calling bank and speaking to representative.  Cheron Every discussed this with "corporate" and called back and said they still cannot admit pt, even though pt was told this was adequate money.  They are worried about medicaid being retroactive and pt would be self pay pt at $275 per day and this amount of money does not last until the end of the month at that rate.  Pt can go to their business office Monday and "talk about options" but not guaranteed admission.   CSW spoke to [place] at [facility] on advice of Nathaniel Man.  She can review pt for possible admission there but needs  documents sent to her on the hub. Winferd Humphrey, MSW, LCSW Clinical Social Worker 10/29/2017 2:50 PM   CSW sent FL2 to [employer] through hub.  PASSR number located: [id number] A. Winferd Humphrey, MSW, LCSW Clinical Social Worker 10/29/2017 3:18 PM

## 2017-10-29 NOTE — ED Notes (Signed)
Attempted to do CBG that was due at 1800.  Patient refuses states I just ate some peaches.

## 2017-10-29 NOTE — ED Triage Notes (Signed)
Pt waiting for approval from SNF. SW reports call has been placed and are now waiting for response.

## 2017-10-30 ENCOUNTER — Other Ambulatory Visit: Payer: Self-pay

## 2017-10-30 DIAGNOSIS — J90 Pleural effusion, not elsewhere classified: Secondary | ICD-10-CM | POA: Diagnosis not present

## 2017-10-30 LAB — CBG MONITORING, ED
GLUCOSE-CAPILLARY: 144 mg/dL — AB (ref 70–99)
Glucose-Capillary: 99 mg/dL (ref 70–99)

## 2017-10-30 NOTE — NC FL2 (Signed)
[place] LEVEL OF CARE SCREENING TOOL     IDENTIFICATION  Patient Name: Sharon Spears Birthdate: [birth year] Sex: female Admission Date (Current Location): 10/27/2017  [place] and [place] Number:  Kathleen Argue [id number] [place] and Address:  [facility] [place], [place] [address] [place], [place] [address]      Provider Number: [id number]  Attending Physician Name and Address:  Default, Provider, MD  Relative Name and Phone Number:       Current Level of Care: Hospital Recommended Level of Care: Skilled Nursing  Prior Approval Number:    Date Approved/Denied: 10/27/17 PASRR Number: [id number]  Discharge Plan: SNF    Current Diagnoses: Patient Active Problem List   Diagnosis Date Noted  . Acute on chronic left systolic heart failure ([place]) 09/25/2017  . Heart palpitations 06/02/2016  . Acute on chronic respiratory failure with hypoxia ([place]) 03/20/2015  . Asthma exacerbation 03/20/2015  . Laryngitis, acute 03/20/2015  . Thrombocytopenia ([place]) 03/20/2015  . Diabetes mellitus type 2, controlled ([place]) 03/20/2015  . HTN (hypertension) 03/20/2015  . S/P CABG (coronary artery bypass graft) 03/20/2015    Orientation RESPIRATION BLADDER Height & Weight     Self, Time, Situation, Place  Normal Continent Weight: 64 kg Height:  _0  (157.5 cm)  BEHAVIORAL SYMPTOMS/MOOD NEUROLOGICAL BOWEL NUTRITION STATUS      Continent Diet(please see discharge summary )  AMBULATORY STATUS COMMUNICATION OF NEEDS Skin   Limited Assist Verbally Normal                       Personal Care Assistance Level of Assistance  Bathing, Feeding, Dressing, Total care Bathing Assistance: Limited assistance Feeding assistance: Limited assistance Dressing Assistance: Limited assistance     Functional Limitations Info  Sight, Hearing, Speech Sight Info: Adequate Hearing Info: Adequate Speech Info: Adequate    SPECIAL CARE FACTORS FREQUENCY  PT (By licensed PT),  OT (By licensed OT)     PT Frequency: 5 times a week  OT Frequency: 5 times a week             Contractures Contractures Info: Not present    Additional Factors Info  Code Status, Allergies Code Status Info: Prior  Allergies Info: Adhesive Tape, Penicillins           Current Medications (10/30/2017):  This is the current hospital active medication list Current Facility-Administered Medications  Medication Dose Route Frequency Provider Last Rate Last Dose  . albuterol (PROVENTIL HFA;VENTOLIN HFA) 108 (90 Base) MCG/ACT inhaler 2 puff  2 puff Inhalation Q6H PRN Isla Pence, MD      . apixaban Arne Cleveland) tablet 5 mg  5 mg Oral BID Isla Pence, MD   5 mg at 10/29/17 2134  . aspirin EC tablet 81 mg  81 mg Oral QHS Isla Pence, MD   81 mg at 10/28/17 2220  . famotidine (PEPCID) tablet 20 mg  20 mg Oral QAC breakfast Isla Pence, MD   20 mg at 10/30/17 7846  . furosemide (LASIX) tablet 20 mg  20 mg Oral BID Isla Pence, MD   20 mg at 10/30/17 9629  . metFORMIN (GLUCOPHAGE-XR) 24 hr tablet 500 mg  500 mg Oral BID Isla Pence, MD   500 mg at 10/30/17 5284  . metoprolol succinate (TOPROL-XL) 24 hr tablet 50 mg  50 mg Oral Daily Isla Pence, MD   50 mg at 10/30/17 1324  . multivitamin with minerals tablet 1 tablet  1 tablet Oral Daily [place],  Almyra Free, MD   1 tablet at 10/30/17 1595  . zolpidem (AMBIEN) tablet 5 mg  5 mg Oral QHS PRN Isla Pence, MD   5 mg at 10/29/17 2134   Current Outpatient Medications  Medication Sig Dispense Refill  . acetaminophen (TYLENOL) 500 MG tablet Take 2,000 mg by mouth every 6 (six) hours as needed (for pain).    Marland Kitchen albuterol (PROVENTIL HFA;VENTOLIN HFA) 108 (90 BASE) MCG/ACT inhaler Inhale 2 puffs into the lungs every 6 (six) hours as needed for wheezing.     Marland Kitchen albuterol (PROVENTIL) (2.5 MG/3ML) 0.083% nebulizer solution Take 3 mLs (2.5 mg total) by nebulization every 6 (six) hours as needed for wheezing or shortness of breath. 75  mL 12  . apixaban (ELIQUIS) 5 MG TABS tablet Take 1 tablet (5 mg total) by mouth 2 (two) times daily. 60 tablet 3  . aspirin EC 81 MG tablet Take 81 mg by mouth at bedtime.    . famotidine (PEPCID) 20 MG tablet Take 20 mg by mouth daily before breakfast.   3  . ferrous sulfate 325 (65 FE) MG tablet Take 1 tablet (325 mg total) by mouth 3 (three) times daily with meals. 90 tablet 3  . fluticasone (FLONASE) 50 MCG/ACT nasal spray Place 2 sprays into both nostrils 2 (two) times daily. (Patient taking differently: Place 2 sprays into both nostrils as needed for allergies or rhinitis. ) 1 g 2  . furosemide (LASIX) 20 MG tablet Take 2 tablets (40 mg total) by mouth daily. (Patient taking differently: Take 20 mg by mouth 2 (two) times daily. ) 30 tablet 0  . metFORMIN (GLUCOPHAGE XR) 500 MG 24 hr tablet Take 1 tablet (500 mg total) by mouth daily with breakfast. (Patient taking differently: Take 500 mg by mouth 2 (two) times daily. ) 30 tablet 2  . metoprolol succinate (TOPROL-XL) 50 MG 24 hr tablet Take 50 mg by mouth daily. Take with or immediately following a meal.    . Multiple Vitamin (MULTIVITAMIN WITH MINERALS) TABS tablet Take 1 tablet by mouth daily. 30 tablet 3  . Multiple Vitamins-Minerals (ADULT ONE DAILY GUMMIES) CHEW Chew 1 tablet by mouth 2 (two) times a week.    . nitroGLYCERIN (NITROSTAT) 0.4 MG SL tablet Place 0.4 mg under the tongue every 5 (five) minutes as needed for chest pain.     Marland Kitchen zolpidem (AMBIEN) 5 MG tablet Take 5 mg by mouth at bedtime as needed for sleep.    . Amino Acids-Protein Hydrolys (FEEDING SUPPLEMENT, PRO-STAT SUGAR FREE 64,) LIQD Take 30 mLs by mouth 2 (two) times daily. (Patient not taking: Reported on 10/14/2017) 900 mL 0  . HYDROcodone-acetaminophen (NORCO) 5-325 MG tablet Take 1 tablet by mouth every 4 (four) hours as needed for severe pain. (Patient not taking: Reported on 10/28/2017) 10 tablet 0  . Spacer/Aero-Holding Chambers (AEROCHAMBER PLUS WITH MASK) inhaler  Use as instructed 1 each 2     Discharge Medications: Please see discharge summary for a list of discharge medications.  Relevant Imaging Results:  Relevant Lab Results:   Additional Information [id number].  Wetzel Bjornstad, LCSWA

## 2017-10-30 NOTE — Progress Notes (Addendum)
10:30am-CSW has resent information to [facility] at this time. Facility to follow up tomorrow for further needs of CSW.   9:54am-CSW received text back from [place] with [facility] and was informed that pt is not able to be admitted to the facility as pt doesn't have Long term Medicaid. [name] expressed that pt does have Medicaid but the type to get admitted to a facility for long term. [name] expressed that if pt was to come to the facility then pt would be private pay ( $270.00 per day which amounts to $3,510 a month and pt only gets $1,000 a month through SS). CSW spoke with Tammy from [facility] and was informed that she is not sure if they can take pt but she has sent over information to administration office for further review of pt's Medicaid. Tammy expressed that she would come and see pt tomorrow if time permits and will follow up with CSW as needed. Tammy expressed that she ran Medicaid for pt and it came back as [employer] as well.   CSW spoke with Nathaniel Man as pt was becoming very impatient and wanting to leave to get car. CSW was informed by Edwyna Ready that if pt is a candidate for [facility] [employer] [facility] then he would need to work on that but couldn't do that until Monday as well. CSW has spoken with pt and updated pt that if CSW is unable to get pt placed by tomorrow and pt is ready to go then pt could as CSW nor other staff are abe to hold pt against will.   CSW aware that per last CSW's note [facility] is UNABLE to take pt due to financial barriers. CSW reached out to [name] with [facility] to gather more information on this as on Friday CSW was aware that pt would be able to come to [facility] even if insurance denied (pt has Medicaid and the money requested by [facility]). CSW has also reached out to Tammy with [facility] as last not from [place] expressed that this facility may be able to assist in pt's needs.    CSW spoke with pt at bedside where pt is upset and not understanding the cause of [facility] not taking pt. CSW explained that CSW would  work to figure out what is taking place and keep pt updated as much as possible. At this time CSW awaits call back from [facility] and message from [place].     Virgie Dad Pluma Diniz, MSW, [facility] Clinical Social Worker [phone]

## 2017-10-30 NOTE — ED Provider Notes (Signed)
The patient is being discharged safely from the emergency department.  Social work has been involved.  Please see social work notes for complete details.  Home with outpatient resources for shelters.  Patient does have money and therefore she can pay for a hotel/motel if necessary.  Patient has a son who lives in [place] who she can reach out to 4 financial assistance as well as a place to stay.   Jola Schmidt, MD 10/30/17 1048

## 2017-10-30 NOTE — ED Notes (Signed)
Pt states she is leaving "I'm signing myself out to go get my car". Pt is concerned d/t car is at [employer]. Dr Venora Maples, SW, and Off-Duty aware and are in w/pt. Off-Duty [employer] advising she will have someone check on her car.

## 2017-10-30 NOTE — Progress Notes (Addendum)
CSW reviewed previous note  from past CSW in pt chart. Per note pt is seeking Emergency Medicaid however not eligible per note as pt can not be a [place] Citizen. CSW spoke with [facility] and was informed that since pt is walking a further distant then pt may not be suitable for SNF placement. CSW explained to pt the barriers that CSW's encountered while trying to place pt [facility] denied twice and pt not having long term Medicaid to pay for placement and pt not being able to pay privately at this time for placement). Pt appeared to be understanding and expressed that pt would go to [facility] and then find a room at a Motel if not able to stay at [facility]. Pt will take taxi to get car from the [employer] at this time. Pt also expressed that pt was going to follow up with social worker from DSS to see what is taking place with pt's medicaid.    There are no further CSW needs at this time. CSW will sign off.    Sharon Dad. Sharon Spears, MSW, [facility] Clinical Social Worker [phone]

## 2017-10-30 NOTE — Progress Notes (Signed)
CSW has spoken with [employer] taxi service and informed that they are on there was to pick pt up from the ED. RN in the ED notified.     Sharon Spears Sharon Spears, MSW, [place] [facility] Clinical Social Worker [phone]

## 2017-10-30 NOTE — ED Notes (Signed)
SW aware of pt.

## 2017-10-30 NOTE — ED Notes (Signed)
SW in w/pt.

## 2017-10-30 NOTE — ED Notes (Signed)
D/C paperwork given to pt - verbalizes understanding to follow up w/Dr Terrence Dupont. Pt verified she all of her belongings. Pt given cab voucher to get to her vehicle at [employer]. States she has her keys. States she is going to check on a hotel to stay or going to [employer]. States she has been staying in her vehicle d/t people who use drugs are staying at [employer].

## 2017-10-30 NOTE — ED Notes (Signed)
Checked [facility], RN Susanna informed

## 2017-10-30 NOTE — ED Notes (Signed)
Off-Duty [employer] Officer advised pt another officer checked on her vehicle and it is at [employer]. Also advised Officer advised he will Secondary school teacher and let them know pt's vehicle may be there a few days. Pt appeared relieved. Pt voiced agreement w/tx plan - will wait until tomorrow (10/31/17) afternoon to hear back from nsg facility.

## 2017-10-30 NOTE — ED Notes (Signed)
SW advised pt of tx plan - d/c. Pt voiced agreement and understanding of plan. Pt asked if she may stay to eat lunch prior to leaving.

## 2017-10-31 NOTE — Progress Notes (Unsigned)
CSW received call from [facility] that [facility] [employer] has now approved pt for a stay at their facility. CSW advised facility that pt discharged on yesterday to the [facility]. Facility expressed that they did make contact with pt and pt is on the way to the facility at this time. CSW has faxed over AVS to [name] at the facility.

## 2018-01-13 ENCOUNTER — Encounter: Payer: Self-pay | Admitting: Family Medicine

## 2018-01-15 ENCOUNTER — Encounter (HOSPITAL_COMMUNITY): Payer: Self-pay | Admitting: Emergency Medicine

## 2018-01-15 ENCOUNTER — Other Ambulatory Visit: Payer: Self-pay

## 2018-01-15 ENCOUNTER — Emergency Department (HOSPITAL_COMMUNITY)
Admission: EM | Admit: 2018-01-15 | Discharge: 2018-01-16 | Disposition: A | Discharge status: Home / Self Care | Payer: Medicare Other | Attending: Emergency Medicine | Admitting: Emergency Medicine

## 2018-01-15 ENCOUNTER — Encounter (HOSPITAL_COMMUNITY): Payer: Self-pay

## 2018-01-15 ENCOUNTER — Emergency Department (HOSPITAL_COMMUNITY): Payer: Medicare Other

## 2018-01-15 ENCOUNTER — Ambulatory Visit (INDEPENDENT_AMBULATORY_CARE_PROVIDER_SITE_OTHER)
Admission: EM | Admit: 2018-01-15 | Discharge: 2018-01-15 | Disposition: A | Discharge status: Home / Self Care | Payer: Medicare Other | Source: Home / Self Care

## 2018-01-15 DIAGNOSIS — I5022 Chronic systolic (congestive) heart failure: Secondary | ICD-10-CM | POA: Diagnosis not present

## 2018-01-15 DIAGNOSIS — J181 Lobar pneumonia, unspecified organism: Secondary | ICD-10-CM | POA: Insufficient documentation

## 2018-01-15 DIAGNOSIS — I110 Hypertensive heart disease with heart failure: Secondary | ICD-10-CM | POA: Diagnosis not present

## 2018-01-15 DIAGNOSIS — I2510 Atherosclerotic heart disease of native coronary artery without angina pectoris: Secondary | ICD-10-CM | POA: Insufficient documentation

## 2018-01-15 DIAGNOSIS — R0602 Shortness of breath: Secondary | ICD-10-CM | POA: Diagnosis present

## 2018-01-15 DIAGNOSIS — Z7901 Long term (current) use of anticoagulants: Secondary | ICD-10-CM | POA: Insufficient documentation

## 2018-01-15 DIAGNOSIS — E119 Type 2 diabetes mellitus without complications: Secondary | ICD-10-CM | POA: Insufficient documentation

## 2018-01-15 DIAGNOSIS — J45909 Unspecified asthma, uncomplicated: Secondary | ICD-10-CM | POA: Insufficient documentation

## 2018-01-15 DIAGNOSIS — Z7984 Long term (current) use of oral hypoglycemic drugs: Secondary | ICD-10-CM | POA: Diagnosis not present

## 2018-01-15 DIAGNOSIS — Z79899 Other long term (current) drug therapy: Secondary | ICD-10-CM | POA: Diagnosis not present

## 2018-01-15 DIAGNOSIS — R030 Elevated blood-pressure reading, without diagnosis of hypertension: Secondary | ICD-10-CM

## 2018-01-15 DIAGNOSIS — J4521 Mild intermittent asthma with (acute) exacerbation: Secondary | ICD-10-CM

## 2018-01-15 DIAGNOSIS — Z951 Presence of aortocoronary bypass graft: Secondary | ICD-10-CM | POA: Diagnosis not present

## 2018-01-15 DIAGNOSIS — J189 Pneumonia, unspecified organism: Secondary | ICD-10-CM

## 2018-01-15 LAB — CBC WITH DIFFERENTIAL/PLATELET
Abs Immature Granulocytes: 0.02 10*3/uL (ref 0.00–0.07)
Basophils Absolute: 0 10*3/uL (ref 0.0–0.1)
Basophils Relative: 0 %
Eosinophils Absolute: 0 10*3/uL (ref 0.0–0.5)
Eosinophils Relative: 0 %
HCT: 32.1 % — ABNORMAL LOW (ref 36.0–46.0)
Hemoglobin: 9.6 g/dL — ABNORMAL LOW (ref 12.0–15.0)
IMMATURE GRANULOCYTES: 0 %
Lymphocytes Relative: 13 %
Lymphs Abs: 0.9 10*3/uL (ref 0.7–4.0)
MCH: 22.8 pg — ABNORMAL LOW (ref 26.0–34.0)
MCHC: 29.9 g/dL — ABNORMAL LOW (ref 30.0–36.0)
MCV: 76.2 fL — ABNORMAL LOW (ref 80.0–100.0)
Monocytes Absolute: 0.5 10*3/uL (ref 0.1–1.0)
Monocytes Relative: 7 %
Neutro Abs: 5.5 10*3/uL (ref 1.7–7.7)
Neutrophils Relative %: 80 %
Platelets: 182 10*3/uL (ref 150–400)
RBC: 4.21 MIL/uL (ref 3.87–5.11)
RDW: 16.1 % — ABNORMAL HIGH (ref 11.5–15.5)
WBC: 7 10*3/uL (ref 4.0–10.5)
nRBC: 0 % (ref 0.0–0.2)

## 2018-01-15 LAB — COMPREHENSIVE METABOLIC PANEL
ALBUMIN: 3.6 g/dL (ref 3.5–5.0)
ALK PHOS: 75 U/L (ref 38–126)
ALT: 26 U/L (ref 0–44)
ANION GAP: 8 (ref 5–15)
AST: 32 U/L (ref 15–41)
BILIRUBIN TOTAL: 0.8 mg/dL (ref 0.3–1.2)
BUN: 17 mg/dL (ref 8–23)
CALCIUM: 9.1 mg/dL (ref 8.9–10.3)
CO2: 23 mmol/L (ref 22–32)
Chloride: 104 mmol/L (ref 98–111)
Creatinine, Ser: 0.82 mg/dL (ref 0.44–1.00)
GFR calc Af Amer: >60 mL/min (ref >60)
GFR calc non Af Amer: >60 mL/min (ref >60)
Glucose, Bld: 147 mg/dL — ABNORMAL HIGH (ref 70–99)
POTASSIUM: 4.1 mmol/L (ref 3.5–5.1)
Sodium: 135 mmol/L (ref 135–145)
TOTAL PROTEIN: 7.8 g/dL (ref 6.5–8.1)

## 2018-01-15 LAB — URINALYSIS, ROUTINE W REFLEX MICROSCOPIC
Bilirubin Urine: NEGATIVE
Glucose, UA: NEGATIVE mg/dL
Ketones, ur: NEGATIVE mg/dL
LEUKOCYTES UA: NEGATIVE
Nitrite: NEGATIVE
Protein, ur: 100 mg/dL — AB
SPECIFIC GRAVITY, URINE: 1.019 (ref 1.005–1.030)
pH: 5 (ref 5.0–8.0)

## 2018-01-15 LAB — I-STAT CG4 LACTIC ACID, ED
Lactic Acid, Venous: 1.77 mmol/L (ref 0.5–1.9)
Lactic Acid, Venous: 1.81 mmol/L (ref 0.5–1.9)

## 2018-01-15 LAB — I-STAT TROPONIN, ED: Troponin i, poc: 0.02 ng/mL (ref 0.00–0.08)

## 2018-01-15 LAB — INFLUENZA PANEL BY PCR (TYPE A & B)
Influenza A By PCR: NEGATIVE
Influenza B By PCR: NEGATIVE

## 2018-01-15 LAB — LIPASE, BLOOD: LIPASE: 46 U/L (ref 11–51)

## 2018-01-15 MED ORDER — ALBUTEROL SULFATE HFA 108 (90 BASE) MCG/ACT IN AERS
INHALATION_SPRAY | RESPIRATORY_TRACT | Status: AC
  Filled 2018-01-15: qty 6.7

## 2018-01-15 MED ORDER — SODIUM CHLORIDE 0.9 % IV SOLN
1.0000 g | Freq: Once | INTRAVENOUS | Status: AC
  Administered 2018-01-15: 1 g via INTRAVENOUS
  Filled 2018-01-15: qty 10

## 2018-01-15 MED ORDER — AMOXICILLIN-POT CLAVULANATE 875-125 MG PO TABS: 1.0000 | ORAL_TABLET | Freq: Two times a day (BID) | ORAL | 0 refills | Status: DC

## 2018-01-15 MED ORDER — IPRATROPIUM-ALBUTEROL 0.5-2.5 (3) MG/3ML IN SOLN
RESPIRATORY_TRACT | Status: AC
  Filled 2018-01-15: qty 3

## 2018-01-15 MED ORDER — ALBUTEROL SULFATE HFA 108 (90 BASE) MCG/ACT IN AERS
2.0000 | INHALATION_SPRAY | Freq: Once | RESPIRATORY_TRACT | Status: AC
  Administered 2018-01-15: 2 via RESPIRATORY_TRACT

## 2018-01-15 MED ORDER — ACETAMINOPHEN 500 MG PO TABS
1000.0000 mg | ORAL_TABLET | Freq: Once | ORAL | Status: AC
  Administered 2018-01-15: 1000 mg via ORAL
  Filled 2018-01-15: qty 2

## 2018-01-15 MED ORDER — IPRATROPIUM-ALBUTEROL 0.5-2.5 (3) MG/3ML IN SOLN
3.0000 mL | Freq: Once | RESPIRATORY_TRACT | Status: AC
  Administered 2018-01-15: 3 mL via RESPIRATORY_TRACT
  Filled 2018-01-15: qty 3

## 2018-01-15 MED ORDER — IPRATROPIUM-ALBUTEROL 0.5-2.5 (3) MG/3ML IN SOLN
3.0000 mL | Freq: Once | RESPIRATORY_TRACT | Status: AC
  Administered 2018-01-15: 3 mL via RESPIRATORY_TRACT

## 2018-01-15 MED ORDER — ONDANSETRON HCL 4 MG/2ML IJ SOLN
4.0000 mg | Freq: Once | INTRAMUSCULAR | Status: AC
  Administered 2018-01-15: 4 mg via INTRAVENOUS
  Filled 2018-01-15: qty 2

## 2018-01-15 MED ORDER — ALBUTEROL SULFATE HFA 108 (90 BASE) MCG/ACT IN AERS
2.0000 | INHALATION_SPRAY | Freq: Once | RESPIRATORY_TRACT | Status: AC
  Administered 2018-01-15: 2 via RESPIRATORY_TRACT
  Filled 2018-01-15: qty 6.7

## 2018-01-15 MED ORDER — SODIUM CHLORIDE 0.9 % IV SOLN
500.0000 mg | Freq: Once | INTRAVENOUS | Status: AC
  Administered 2018-01-15: 500 mg via INTRAVENOUS
  Filled 2018-01-15: qty 500

## 2018-01-15 MED ORDER — SODIUM CHLORIDE 0.9 % IV SOLN
Freq: Once | INTRAVENOUS | Status: AC
  Administered 2018-01-15: 19:00:00 via INTRAVENOUS

## 2018-01-15 MED ORDER — AZITHROMYCIN 250 MG PO TABS: 250.0000 mg | ORAL_TABLET | Freq: Every day | ORAL | 0 refills | Status: DC

## 2018-01-15 NOTE — Discharge Instructions (Addendum)
Duoneb given in office, with improvement in symptoms Albuterol inhaler given in office.  Use as needed for shortness of breath and/or wheezing Please obtain your nebulizer to use as needed Follow up with PCP next week for recheck Return here or go to ER if you have any new or worsening symptoms such as shortness of breath, difficulty breathing, accessory muscle use, rib retraction, or if symptoms do not improve with medication   Blood pressure elevated in office.  Please recheck in 24 hours.  If it continues to be greater than 140/90 please follow up with PCP for further evaluation and management.

## 2018-01-15 NOTE — ED Provider Notes (Signed)
[facility]   [id number] 01/15/18 Arrival Time: 36  Cc: Asthma  SUBJECTIVE:  Sharon Spears is a 73 y.o. female hx significant for asthma, HTN, CAD, DM, who presents with cough, SOB and wheezing that began yesterday.  Symptoms began after she went to [place] [employer], states the dust may have flared up her asthma.  Left nebulizer at a friends house, and presents today for breathing treatment.  Has tried inhaler with minimal relief.  Reports previous symptoms in the past.   Denies fever, chills, fatigue, sinus pain, rhinorrhea, sore throat, chest pain, nausea, changes in bowel or bladder habits.    ROS: As per HPI.  Past Medical History:  Diagnosis Date  . Adjustment disorder with anxiety   . Asthma   . Benign essential hypertension   . Coronary artery disease   . Diabetic neuropathic arthritis ([place])   . Heart palpitations   . Uncontrolled diabetes mellitus type 2 without complications [facility])    Past Surgical History:  Procedure Laterality Date  . CARDIAC SURGERY    . CESAREAN SECTION    . CORONARY ARTERY BYPASS GRAFT    . ESOPHAGEAL MANOMETRY N/A 03/06/2012   Procedure: ESOPHAGEAL MANOMETRY (EM);  Surgeon: Beryle Beams, MD;  Location: [facility] ENDOSCOPY;  Service: Endoscopy;  Laterality: N/A;  . TONSILLECTOMY     Allergies  Allergen Reactions  . Adhesive [Tape] Itching  . Penicillins Other (See Comments)    GI upset Has patient had a PCN reaction causing immediate rash, facial/tongue/throat swelling, SOB or lightheadedness with hypotension: No Has patient had a PCN reaction causing severe rash involving mucus membranes or skin necrosis: No Has patient had a PCN reaction that required hospitalization: No Has patient had a PCN reaction occurring within the last 10 years: No If all of the above answers are "NO", then may proceed with Cephalosporin use.   No current facility-administered medications on file prior to encounter.    Current Outpatient Medications on File  Prior to Encounter  Medication Sig Dispense Refill  . albuterol (PROVENTIL HFA;VENTOLIN HFA) 108 (90 BASE) MCG/ACT inhaler Inhale 2 puffs into the lungs every 6 (six) hours as needed for wheezing.     Marland Kitchen albuterol (PROVENTIL) (2.5 MG/3ML) 0.083% nebulizer solution Take 3 mLs (2.5 mg total) by nebulization every 6 (six) hours as needed for wheezing or shortness of breath. 75 mL 12  . apixaban (ELIQUIS) 5 MG TABS tablet Take 1 tablet (5 mg total) by mouth 2 (two) times daily. 60 tablet 3  . aspirin EC 81 MG tablet Take 81 mg by mouth at bedtime.    . famotidine (PEPCID) 20 MG tablet Take 20 mg by mouth daily before breakfast.   3  . ferrous sulfate 325 (65 FE) MG tablet Take 1 tablet (325 mg total) by mouth 3 (three) times daily with meals. 90 tablet 3  . fluticasone (FLONASE) 50 MCG/ACT nasal spray Place 2 sprays into both nostrils 2 (two) times daily. (Patient taking differently: Place 2 sprays into both nostrils as needed for allergies or rhinitis. ) 1 g 2  . furosemide (LASIX) 20 MG tablet Take 2 tablets (40 mg total) by mouth daily. (Patient taking differently: Take 20 mg by mouth 2 (two) times daily. ) 30 tablet 0  . metFORMIN (GLUCOPHAGE XR) 500 MG 24 hr tablet Take 1 tablet (500 mg total) by mouth daily with breakfast. (Patient taking differently: Take 500 mg by mouth 2 (two) times daily. ) 30 tablet 2  . metoprolol  succinate (TOPROL-XL) 50 MG 24 hr tablet Take 50 mg by mouth daily. Take with or immediately following a meal.    . Multiple Vitamin (MULTIVITAMIN WITH MINERALS) TABS tablet Take 1 tablet by mouth daily. 30 tablet 3  . acetaminophen (TYLENOL) 500 MG tablet Take 2,000 mg by mouth every 6 (six) hours as needed (for pain).    . nitroGLYCERIN (NITROSTAT) 0.4 MG SL tablet Place 0.4 mg under the tongue every 5 (five) minutes as needed for chest pain.     Marland Kitchen zolpidem (AMBIEN) 5 MG tablet Take 5 mg by mouth at bedtime as needed for sleep.      Social History   Socioeconomic History  .  Marital status: Legally Separated    Spouse name: Not on file  . Number of children: 4  . Years of education: 61  . Highest education level: Not on file  Occupational History  . Occupation: Retired  Scientific laboratory technician  . Financial resource strain: Not on file  . Food insecurity:    Worry: Not on file    Inability: Not on file  . Transportation needs:    Medical: Not on file    Non-medical: Not on file  Tobacco Use  . Smoking status: Never Smoker  . Smokeless tobacco: Never Used  Substance and Sexual Activity  . Alcohol use: No  . Drug use: No  . Sexual activity: Yes  Lifestyle  . Physical activity:    Days per week: Not on file    Minutes per session: Not on file  . Stress: Not on file  Relationships  . Social connections:    Talks on phone: Not on file    Gets together: Not on file    Attends religious service: Not on file    Active member of club or organization: Not on file    Attends meetings of clubs or organizations: Not on file    Relationship status: Not on file  . Intimate partner violence:    Fear of current or ex partner: Not on file    Emotionally abused: Not on file    Physically abused: Not on file    Forced sexual activity: Not on file  Other Topics Concern  . Not on file  Social History Narrative   Fun/Hobby: Gardening    Denies abuse and feels safe at home.    Family History  Problem Relation Age of Onset  . Asthma Mother   . Alcohol abuse Father      OBJECTIVE:  Vitals:   01/15/18 1059 01/15/18 1202  BP: (!) 179/61   Pulse: 70   Resp: (!) 30 20  Temp: 99.6 F (37.6 C)   TempSrc: Oral   SpO2: 98%      General appearance: Alert, well-appearing; speaking in full sentences without difficulty; not in respiratory distress HEENT:NCAT; Ears: EACs clear, TMs pearly gray; Eyes: PERRL.  EOM grossly intact. Nose: nares patent without rhinorrhea; Throat: tonsils nonerythematous or enlarged, uvula midline  Neck: supple without LAD Lungs: Decreased  breath sounds throughout, no wheezes; normal respiratory effort; no cough present; improved breath sounds following duo-neb x 1 Heart: regular rate and rhythm.  Radial pulses 2+ symmetrical bilaterally Skin: warm and dry Psychological: alert and cooperative; normal mood and affect   ASSESSMENT & PLAN:  1. Mild intermittent asthma with exacerbation   2. Elevated blood pressure reading     Meds ordered this encounter  Medications  . albuterol (PROVENTIL HFA;VENTOLIN HFA) 108 (90 Base) MCG/ACT inhaler  2 puff  . ipratropium-albuterol (DUONEB) 0.5-2.5 (3) MG/3ML nebulizer solution 3 mL     Duoneb given in office, with improvement in symptoms Albuterol inhaler given in office.  Use as needed for shortness of breath and/or wheezing Please obtain your nebulizer to use as needed Follow up with PCP next week for recheck Return here or go to ER if you have any new or worsening symptoms such as shortness of breath, difficulty breathing, accessory muscle use, rib retraction, or if symptoms do not improve with medication   Blood pressure elevated in office.  Please recheck in 24 hours.  If it continues to be greater than 140/90 please follow up with PCP for further evaluation and management.    Reviewed expectations re: course of current medical issues. Questions answered. Outlined signs and symptoms indicating need for more acute intervention. Patient verbalized understanding. After Visit Summary given.          Lestine Box, PA-C 01/15/18 1204

## 2018-01-15 NOTE — ED Triage Notes (Signed)
Pt left her nebulizer at her friends when she was visiting and she has not been able to treat herself.  She started having some SOB yesterday and is here today to have a breathing treatment until she can get her own machine back from her friend.

## 2018-01-15 NOTE — ED Triage Notes (Addendum)
Pt arrives POV for eval of shortness of breath, cough, N/V, body aches, chills, general malaise since yesterday. Pt w/ productive cough. Pt denies known sick contacts. Pt seen and evaluated for SOB at UC today, dc'd.

## 2018-01-15 NOTE — Discharge Instructions (Addendum)
1.  Call Dr. Zenia Resides office in the morning.  He will see you tomorrow at the office for recheck. 2.  You have been given a dose of antibiotics in the emergency department.  You can go to the pharmacy and fill your prescriptions for Augmentin and Zithromax tomorrow and start them tomorrow evening. 3.  Use your inhaler every 4 hours as needed for coughing or wheezing. 4.  Take Tylenol every 4-6 hours for fever and body aches.  Take as instructed on package. 5.  Take a 40 mg dose of Lasix when you get home.  Take a 40 mg Lasix dose tomorrow midmorning.  Then discuss ongoing doses with Dr. Terrence Dupont.

## 2018-01-15 NOTE — ED Notes (Signed)
In room to check on patient.  Son updated via phone per patient request.

## 2018-01-15 NOTE — ED Provider Notes (Signed)
[facility] EMERGENCY DEPARTMENT Provider Note   CSN: [id number] Arrival date & time: 01/15/18  1600     History   Chief Complaint Chief Complaint  Patient presents with  . Shortness of Breath    HPI Sharon Spears is a 73 y.o. female.  HPI Patient reports she started getting a fever yesterday.  She reports that she has had cough and chest congestion.  Patient reports she has body aches and headache.  She reports she is feeling generally weak.  Patient reports she is felt nauseated but has not had any vomiting.  No pain burning urgency with urination.  No swelling of the legs.  She feels like the symptoms have made her asthma act up as well.  Patient reports she had a fever up to 102 earlier today before coming to the emergency department.  She believes that most of the symptoms started yesterday and think she had a fever yesterday as well.  Patient sees Dr. Terrence Dupont.  She has medical history significant for diabetes, congestive heart failure, asthma, coronary artery bypass graft and mitral valve repair, hypertension and chronic anemia.  Patient chronically takes Eliquis. Past Medical History:  Diagnosis Date  . Adjustment disorder with anxiety   . Asthma   . Benign essential hypertension   . Coronary artery disease   . Diabetic neuropathic arthritis ([place])   . Heart palpitations   . Uncontrolled diabetes mellitus type 2 without complications [facility])     Patient Active Problem List   Diagnosis Date Noted  . Acute on chronic left systolic heart failure ([place]) 09/25/2017  . Heart palpitations 06/02/2016  . Acute on chronic respiratory failure with hypoxia ([place]) 03/20/2015  . Asthma exacerbation 03/20/2015  . Laryngitis, acute 03/20/2015  . Thrombocytopenia ([place]) 03/20/2015  . Diabetes mellitus type 2, controlled ([place]) 03/20/2015  . HTN (hypertension) 03/20/2015  . S/P CABG (coronary artery bypass graft) 03/20/2015    Past Surgical History:  Procedure Laterality  Date  . CARDIAC SURGERY    . CESAREAN SECTION    . CORONARY ARTERY BYPASS GRAFT    . ESOPHAGEAL MANOMETRY N/A 03/06/2012   Procedure: ESOPHAGEAL MANOMETRY (EM);  Surgeon: Beryle Beams, MD;  Location: [facility];  Service: Endoscopy;  Laterality: N/A;  . TONSILLECTOMY       OB History   No obstetric history on file.      Home Medications    Prior to Admission medications   Medication Sig Start Date End Date Taking? Authorizing Provider  acetaminophen (TYLENOL) 500 MG tablet Take 1,000 mg by mouth every 6 (six) hours as needed (pain).    Yes [provider]  albuterol (PROVENTIL HFA;VENTOLIN HFA) 108 (90 BASE) MCG/ACT inhaler Inhale 2 puffs into the lungs every 6 (six) hours as needed for wheezing.    Yes [provider]  apixaban (ELIQUIS) 5 MG TABS tablet Take 1 tablet (5 mg total) by mouth 2 (two) times daily. 09/28/17  Yes Charolette Forward, MD  famotidine (PEPCID) 20 MG tablet Take 20 mg by mouth daily before breakfast.  02/04/15  Yes [provider]  fluticasone (FLONASE) 50 MCG/ACT nasal spray Place 2 sprays into both nostrils 2 (two) times daily. Patient taking differently: Place 1 spray into both nostrils daily as needed for allergies or rhinitis.  10/25/15  Yes Jacqualine Mau, NP  furosemide (LASIX) 20 MG tablet Take 2 tablets (40 mg total) by mouth daily. Patient taking differently: Take 20 mg by mouth 2 (two) times daily.  09/28/17  Yes Charolette Forward, MD  losartan (COZAAR) 25 MG tablet Take 25 mg by mouth 2 (two) times daily.   Yes [provider]  metFORMIN (GLUCOPHAGE XR) 500 MG 24 hr tablet Take 1 tablet (500 mg total) by mouth daily with breakfast. Patient taking differently: Take 500 mg by mouth 2 (two) times daily.  06/02/16  Yes Golden Circle, FNP  metoprolol succinate (TOPROL-XL) 50 MG 24 hr tablet Take 50 mg by mouth daily. Take with or immediately following a meal.   Yes [provider]  Multiple Vitamin  (MULTIVITAMIN WITH MINERALS) TABS tablet Take 1 tablet by mouth daily. 09/28/17  Yes Charolette Forward, MD  nitroGLYCERIN (NITROSTAT) 0.4 MG SL tablet Place 0.4 mg under the tongue every 5 (five) minutes as needed for chest pain.    Yes [provider]  albuterol (PROVENTIL) (2.5 MG/3ML) 0.083% nebulizer solution Take 3 mLs (2.5 mg total) by nebulization every 6 (six) hours as needed for wheezing or shortness of breath. Patient not taking: Reported on 01/15/2018 11/21/15   Noe Gens, PA-C  amoxicillin-clavulanate (AUGMENTIN) 875-125 MG tablet Take 1 tablet by mouth 2 (two) times daily. One po bid x 7 days 01/15/18   Charlesetta Shanks, MD  azithromycin (ZITHROMAX) 250 MG tablet Take 1 tablet (250 mg total) by mouth daily. 01/15/18   Charlesetta Shanks, MD  ferrous sulfate 325 (65 FE) MG tablet Take 1 tablet (325 mg total) by mouth 3 (three) times daily with meals. Patient not taking: Reported on 01/15/2018 09/28/17   Charolette Forward, MD    Family History Family History  Problem Relation Age of Onset  . Asthma Mother   . Alcohol abuse Father     Social History Social History   Tobacco Use  . Smoking status: Never Smoker  . Smokeless tobacco: Never Used  Substance Use Topics  . Alcohol use: No  . Drug use: No     Allergies   Adhesive [tape] and Penicillins   Review of Systems Review of Systems 10 Systems reviewed and are negative for acute change except as noted in the HPI.  Physical Exam Updated Vital Signs BP 128/68   Pulse 79   Temp 99.4 F (37.4 C) (Oral)   Resp 18   Ht 5' 3" (1.6 m)   Wt 64.4 kg   SpO2 99%   BMI 25.15 kg/m   Physical Exam Constitutional:      Comments: Patient is alert and nontoxic.  No respiratory distress at rest.  She appears mildly ill and uncomfortable.  HENT:     Head: Normocephalic and atraumatic.     Comments: Posterior oropharynx patent without erythema or exudate.  Mucous membranes moist.    Nose: Nose normal.  Eyes:     Extraocular  Movements: Extraocular movements intact.  Cardiovascular:     Rate and Rhythm: Normal rate and regular rhythm.  Pulmonary:     Comments: Intermittent wet cough.  No respiratory distress.  Crackles at left lung base.  Right lung clear. Abdominal:     General: There is no distension.     Palpations: Abdomen is soft.     Tenderness: There is no abdominal tenderness. There is no guarding.  Musculoskeletal: Normal range of motion.        General: No swelling.     Right lower leg: No edema.     Left lower leg: No edema.  Skin:    General: Skin is warm and dry.  Neurological:  General: No focal deficit present.     Mental Status: She is oriented to person, place, and time.     Motor: No weakness.     Coordination: Coordination normal.  Psychiatric:        Mood and Affect: Mood normal.      ED Treatments / Results  Labs (all labs ordered are listed, but only abnormal results are displayed) Labs Reviewed  COMPREHENSIVE METABOLIC PANEL - Abnormal; Notable for the following components:      Result Value   Glucose, Bld 147 (*)    All other components within normal limits  CBC WITH DIFFERENTIAL/PLATELET - Abnormal; Notable for the following components:   Hemoglobin 9.6 (*)    HCT 32.1 (*)    MCV 76.2 (*)    MCH 22.8 (*)    MCHC 29.9 (*)    RDW 16.1 (*)    All other components within normal limits  URINALYSIS, ROUTINE W REFLEX MICROSCOPIC - Abnormal; Notable for the following components:   Hgb urine dipstick SMALL (*)    Protein, ur 100 (*)    Bacteria, UA RARE (*)    All other components within normal limits  CULTURE, BLOOD (ROUTINE X 2)  CULTURE, BLOOD (ROUTINE X 2)  INFLUENZA PANEL BY PCR (TYPE A & B)  LIPASE, BLOOD  I-STAT CG4 LACTIC ACID, ED  I-STAT CG4 LACTIC ACID, ED  I-STAT TROPONIN, ED    EKG EKG Interpretation  Date/Time:  Sunday January 15 2018 16:10:47 EST Ventricular Rate:  85 PR Interval:    QRS Duration: 80 QT Interval:  376 QTC Calculation: 447 R  Axis:   94 Text Interpretation:  Atrial fibrillation with premature ventricular or aberrantly conducted complexes Rightward axis Nonspecific ST abnormality Abnormal ECG No significant change since last tracing Confirmed by Orlie Dakin [phone]) on 01/15/2018 4:14:40 PM   Radiology Dg Chest 2 View  Result Date: 01/15/2018 CLINICAL DATA:  Shortness of breath, cough, body aches and chills, fever. EXAM: CHEST - 2 VIEW COMPARISON:  Chest radiograph October 27, 2017 FINDINGS: Stable small LEFT pleural effusion. Stable cardiomegaly. Status post median sternotomy for cardiac valve replacement. Bibasilar strandy densities, increased on the RIGHT. No pneumothorax. Soft tissue planes and included osseous structures are nonacute. IMPRESSION: 1. Increased bibasilar atelectasis. Stable small LEFT pleural effusion. 2. Stable cardiomegaly. Electronically Signed   By: Elon Alas M.D.   On: 01/15/2018 17:36    Procedures Procedures (including critical care time)  Medications Ordered in ED Medications  acetaminophen (TYLENOL) tablet 1,000 mg (has no administration in time range)  acetaminophen (TYLENOL) tablet 1,000 mg (1,000 mg Oral Given 01/15/18 1911)  0.9 %  sodium chloride infusion ( Intravenous Stopped 01/15/18 2124)  ondansetron (ZOFRAN) injection 4 mg (4 mg Intravenous Given 01/15/18 1915)  cefTRIAXone (ROCEPHIN) 1 g in sodium chloride 0.9 % 100 mL IVPB (0 g Intravenous Stopped 01/15/18 2151)  azithromycin (ZITHROMAX) 500 mg in sodium chloride 0.9 % 250 mL IVPB (0 mg Intravenous Stopped 01/15/18 2259)  ipratropium-albuterol (DUONEB) 0.5-2.5 (3) MG/3ML nebulizer solution 3 mL (3 mLs Nebulization Given 01/15/18 2159)  albuterol (PROVENTIL HFA;VENTOLIN HFA) 108 (90 Base) MCG/ACT inhaler 2 puff (2 puffs Inhalation Given 01/15/18 2303)     Initial Impression / Assessment and Plan / ED Course  I have reviewed the triage vital signs and the nursing notes.  Pertinent labs & imaging results that were available  during my care of the patient were reviewed by me and considered in my medical decision making (see  chart for details).    Consult: Have reviewed with Dr. Terrence Dupont.  Advises to treat the patient with Rocephin and Zithromax for community-acquired pneumonia.  Can discharge with Augmentin and he will see patient tomorrow for recheck in his office.  Patient presents as outlined above.  She has had some chronic pleural effusion on the left.  Crackle is present.  She reports she has developed fever.  Patient does not have leukocytosis or lactic acidosis.  Vital signs are stable.  Will initiate treatment for pneumonia as per discussed with Dr. Terrence Dupont.  Patient will be seen within 24 hours for recheck.  Final Clinical Impressions(s) / ED Diagnoses   Final diagnoses:  Community acquired pneumonia of left lower lobe of lung [facility])    ED Discharge Orders         Ordered    azithromycin (ZITHROMAX) 250 MG tablet  Daily     01/15/18 2339    amoxicillin-clavulanate (AUGMENTIN) 875-125 MG tablet  2 times daily     01/15/18 2339           Charlesetta Shanks, MD 01/15/18 2347

## 2018-01-16 ENCOUNTER — Ambulatory Visit: Payer: Medicare Other | Admitting: Family Medicine

## 2018-01-20 LAB — CULTURE, BLOOD (ROUTINE X 2)
CULTURE: NO GROWTH
Culture: NO GROWTH
Special Requests: ADEQUATE
Special Requests: ADEQUATE

## 2018-01-21 ENCOUNTER — Emergency Department (HOSPITAL_COMMUNITY)
Admission: EM | Admit: 2018-01-21 | Discharge: 2018-01-21 | Disposition: A | Discharge status: Home / Self Care | Payer: Medicare Other | Attending: Emergency Medicine | Admitting: Emergency Medicine

## 2018-01-21 ENCOUNTER — Emergency Department (HOSPITAL_COMMUNITY): Payer: Medicare Other

## 2018-01-21 ENCOUNTER — Other Ambulatory Visit: Payer: Self-pay

## 2018-01-21 DIAGNOSIS — E119 Type 2 diabetes mellitus without complications: Secondary | ICD-10-CM | POA: Diagnosis not present

## 2018-01-21 DIAGNOSIS — I10 Essential (primary) hypertension: Secondary | ICD-10-CM | POA: Insufficient documentation

## 2018-01-21 DIAGNOSIS — J441 Chronic obstructive pulmonary disease with (acute) exacerbation: Secondary | ICD-10-CM | POA: Insufficient documentation

## 2018-01-21 DIAGNOSIS — R079 Chest pain, unspecified: Secondary | ICD-10-CM | POA: Diagnosis present

## 2018-01-21 DIAGNOSIS — Z79899 Other long term (current) drug therapy: Secondary | ICD-10-CM | POA: Insufficient documentation

## 2018-01-21 LAB — CBC
HEMATOCRIT: 30 % — AB (ref 36.0–46.0)
Hemoglobin: 9.3 g/dL — ABNORMAL LOW (ref 12.0–15.0)
MCH: 23.7 pg — ABNORMAL LOW (ref 26.0–34.0)
MCHC: 31 g/dL (ref 30.0–36.0)
MCV: 76.5 fL — ABNORMAL LOW (ref 80.0–100.0)
Platelets: 265 10*3/uL (ref 150–400)
RBC: 3.92 MIL/uL (ref 3.87–5.11)
RDW: 16.3 % — ABNORMAL HIGH (ref 11.5–15.5)
WBC: 6.6 10*3/uL (ref 4.0–10.5)
nRBC: 0 % (ref 0.0–0.2)

## 2018-01-21 LAB — I-STAT TROPONIN, ED
Troponin i, poc: 0 ng/mL (ref 0.00–0.08)
Troponin i, poc: 0 ng/mL (ref 0.00–0.08)

## 2018-01-21 LAB — BASIC METABOLIC PANEL
Anion gap: 10 (ref 5–15)
BUN: 12 mg/dL (ref 8–23)
CO2: 23 mmol/L (ref 22–32)
Calcium: 8.8 mg/dL — ABNORMAL LOW (ref 8.9–10.3)
Chloride: 103 mmol/L (ref 98–111)
Creatinine, Ser: 0.82 mg/dL (ref 0.44–1.00)
GFR calc Af Amer: >60 mL/min (ref >60)
GFR calc non Af Amer: >60 mL/min (ref >60)
Glucose, Bld: 177 mg/dL — ABNORMAL HIGH (ref 70–99)
Potassium: 3.2 mmol/L — ABNORMAL LOW (ref 3.5–5.1)
Sodium: 136 mmol/L (ref 135–145)

## 2018-01-21 LAB — PROTIME-INR
INR: 1.31
Prothrombin Time: 16.1 seconds — ABNORMAL HIGH (ref 11.4–15.2)

## 2018-01-21 LAB — BRAIN NATRIURETIC PEPTIDE: B Natriuretic Peptide: 217.9 pg/mL — ABNORMAL HIGH (ref 0.0–100.0)

## 2018-01-21 MED ORDER — IPRATROPIUM-ALBUTEROL 0.5-2.5 (3) MG/3ML IN SOLN
3.0000 mL | Freq: Once | RESPIRATORY_TRACT | Status: AC
  Administered 2018-01-21: 3 mL via RESPIRATORY_TRACT
  Filled 2018-01-21: qty 3

## 2018-01-21 MED ORDER — PREDNISONE 10 MG PO TABS: 40.0000 mg | ORAL_TABLET | Freq: Every day | ORAL | 0 refills | Status: AC

## 2018-01-21 MED ORDER — METHYLPREDNISOLONE SODIUM SUCC 125 MG IJ SOLR
125.0000 mg | Freq: Once | INTRAMUSCULAR | Status: AC
  Administered 2018-01-21: 125 mg via INTRAVENOUS
  Filled 2018-01-21: qty 2

## 2018-01-21 NOTE — ED Provider Notes (Signed)
[facility] EMERGENCY DEPARTMENT Provider Note   CSN: [id number] Arrival date & time: 01/21/18  1105     History   Chief Complaint Chief Complaint  Patient presents with  . Chest Pain  . Shortness of Breath    HPI Sharon Spears is a 73 y.o. female.  HPI   73yo female with history of CAD, DM, CHF, htn, DM, hx of CABG and valve surgery in July presents with continuing dyspnea and cough for 6 months.   Has been in and out of the hospital a lot over the last 3 months between [place] and here.  Reports seems like immune system takes all of the germs of the air and lungs never get better. Cannot breath the way I should, cough but can't get the phlegm out, it is there, choking.  Dr. Terrence Dupont saw her, came to ED, was given amoxicillin and azithromycin, not getting any better.  Reports 6 mos of dyspnea. Orthopnea for a few months.  July 2019 had CABG and valve at [facility]. Dull steady chest pain began yesterday, cough worsened yesterday.  Doesn't have nebulizers. No hx of smoking, but was diagnosed with COPD.  Felt hot, subj fevers off and on for one week.   Past Medical History:  Diagnosis Date  . Adjustment disorder with anxiety   . Asthma   . Benign essential hypertension   . Coronary artery disease   . Diabetic neuropathic arthritis ([place])   . Heart palpitations   . Uncontrolled diabetes mellitus type 2 without complications [facility])     Patient Active Problem List   Diagnosis Date Noted  . Acute on chronic left systolic heart failure ([place]) 09/25/2017  . Heart palpitations 06/02/2016  . Acute on chronic respiratory failure with hypoxia ([place]) 03/20/2015  . Asthma exacerbation 03/20/2015  . Laryngitis, acute 03/20/2015  . Thrombocytopenia ([place]) 03/20/2015  . Diabetes mellitus type 2, controlled ([place]) 03/20/2015  . HTN (hypertension) 03/20/2015  . S/P CABG (coronary artery bypass graft) 03/20/2015    Past Surgical History:  Procedure Laterality Date  . CARDIAC  SURGERY    . CESAREAN SECTION    . CORONARY ARTERY BYPASS GRAFT    . ESOPHAGEAL MANOMETRY N/A 03/06/2012   Procedure: ESOPHAGEAL MANOMETRY (EM);  Surgeon: Beryle Beams, MD;  Location: [facility] ENDOSCOPY;  Service: Endoscopy;  Laterality: N/A;  . TONSILLECTOMY       OB History   No obstetric history on file.      Home Medications    Prior to Admission medications   Medication Sig Start Date End Date Taking? Authorizing Provider  acetaminophen (TYLENOL) 500 MG tablet Take 1,000 mg by mouth every 6 (six) hours as needed (pain).     [provider]  albuterol (PROVENTIL HFA;VENTOLIN HFA) 108 (90 BASE) MCG/ACT inhaler Inhale 2 puffs into the lungs every 6 (six) hours as needed for wheezing.     [provider]  albuterol (PROVENTIL) (2.5 MG/3ML) 0.083% nebulizer solution Take 3 mLs (2.5 mg total) by nebulization every 6 (six) hours as needed for wheezing or shortness of breath. Patient not taking: Reported on 01/15/2018 11/21/15   Noe Gens, PA-C  amoxicillin-clavulanate (AUGMENTIN) 875-125 MG tablet Take 1 tablet by mouth 2 (two) times daily. One po bid x 7 days 01/15/18   Charlesetta Shanks, MD  apixaban (ELIQUIS) 5 MG TABS tablet Take 1 tablet (5 mg total) by mouth 2 (two) times daily. 09/28/17   Charolette Forward, MD  azithromycin (ZITHROMAX) 250 MG tablet  Take 1 tablet (250 mg total) by mouth daily. 01/15/18   Charlesetta Shanks, MD  famotidine (PEPCID) 20 MG tablet Take 20 mg by mouth daily before breakfast.  02/04/15   [provider]  ferrous sulfate 325 (65 FE) MG tablet Take 1 tablet (325 mg total) by mouth 3 (three) times daily with meals. 09/28/17   Charolette Forward, MD  fluticasone (FLONASE) 50 MCG/ACT nasal spray Place 2 sprays into both nostrils 2 (two) times daily. Patient taking differently: Place 1 spray into both nostrils daily as needed for allergies or rhinitis.  10/25/15   Jacqualine Mau, NP  furosemide (LASIX) 20 MG tablet Take 2 tablets (40 mg total) by  mouth daily. Patient taking differently: Take 20 mg by mouth 2 (two) times daily.  09/28/17   Charolette Forward, MD  losartan (COZAAR) 25 MG tablet Take 25 mg by mouth 2 (two) times daily.    [provider]  metFORMIN (GLUCOPHAGE XR) 500 MG 24 hr tablet Take 1 tablet (500 mg total) by mouth daily with breakfast. Patient taking differently: Take 500 mg by mouth 2 (two) times daily.  06/02/16   Golden Circle, FNP  metoprolol succinate (TOPROL-XL) 50 MG 24 hr tablet Take 50 mg by mouth daily. Take with or immediately following a meal.    [provider]  Multiple Vitamin (MULTIVITAMIN WITH MINERALS) TABS tablet Take 1 tablet by mouth daily. 09/28/17   Charolette Forward, MD  nitroGLYCERIN (NITROSTAT) 0.4 MG SL tablet Place 0.4 mg under the tongue every 5 (five) minutes as needed for chest pain.     [provider]  predniSONE (DELTASONE) 10 MG tablet Take 4 tablets (40 mg total) by mouth daily for 4 days. 01/21/18 01/25/18  Gareth Morgan, MD    Family History Family History  Problem Relation Age of Onset  . Asthma Mother   . Alcohol abuse Father     Social History Social History   Tobacco Use  . Smoking status: Never Smoker  . Smokeless tobacco: Never Used  Substance Use Topics  . Alcohol use: No  . Drug use: No     Allergies   Adhesive [tape] and Penicillins   Review of Systems Review of Systems  Respiratory: Positive for cough and shortness of breath. Wheezing: hears self.      Physical Exam Updated Vital Signs BP 133/89 (BP Location: Right Arm)   Pulse 69   Temp 98.3 F (36.8 C) (Oral)   Resp 18   Ht _0  (1.6 m)   Wt 63.5 kg   SpO2 100%   BMI 24.80 kg/m   Physical Exam Vitals signs and nursing note reviewed.  Constitutional:      General: She is not in acute distress.    Appearance: She is well-developed. She is not diaphoretic.  HENT:     Head: Normocephalic and atraumatic.  Eyes:     Conjunctiva/sclera: Conjunctivae normal.    Neck:     Musculoskeletal: Normal range of motion.  Cardiovascular:     Rate and Rhythm: Normal rate and regular rhythm.     Heart sounds: Normal heart sounds. No murmur. No friction rub. No gallop.   Pulmonary:     Effort: Pulmonary effort is normal. No respiratory distress.     Breath sounds: Normal breath sounds. No wheezing or rales.     Comments: Bibasilar crackles Abdominal:     General: There is no distension.     Palpations: Abdomen is soft.  Tenderness: There is no abdominal tenderness. There is no guarding.  Musculoskeletal:        General: No tenderness.  Skin:    General: Skin is warm and dry.     Findings: No erythema or rash.  Neurological:     Mental Status: She is alert and oriented to person, place, and time.      ED Treatments / Results  Labs (all labs ordered are listed, but only abnormal results are displayed) Labs Reviewed  BASIC METABOLIC PANEL - Abnormal; Notable for the following components:      Result Value   Potassium 3.2 (*)    Glucose, Bld 177 (*)    Calcium 8.8 (*)    All other components within normal limits  CBC - Abnormal; Notable for the following components:   Hemoglobin 9.3 (*)    HCT 30.0 (*)    MCV 76.5 (*)    MCH 23.7 (*)    RDW 16.3 (*)    All other components within normal limits  PROTIME-INR - Abnormal; Notable for the following components:   Prothrombin Time 16.1 (*)    All other components within normal limits  BRAIN NATRIURETIC PEPTIDE - Abnormal; Notable for the following components:   B Natriuretic Peptide 217.9 (*)    All other components within normal limits  I-STAT TROPONIN, ED  I-STAT TROPONIN, ED    EKG EKG Interpretation  Date/Time:  Saturday January 21 2018 11:15:36 EST Ventricular Rate:  84 PR Interval:    QRS Duration: 80 QT Interval:  426 QTC Calculation: 504 R Axis:   98 Text Interpretation:  Age not entered, assumed to be  73 years old for purpose of ECG interpretation Right and left arm  electrode reversal, interpretation assumes no reversal Atrial fibrillation Borderline right axis deviation Borderline repolarization abnormality Prolonged QT interval No significant change since last tracing Confirmed by Gareth Morgan [phone]) on 01/21/2018 12:03:00 PM Also confirmed by Gareth Morgan [phone]), editor Philomena Doheny [phone])  on 01/21/2018 2:21:50 PM   Radiology Dg Chest 2 View  Result Date: 01/21/2018 CLINICAL DATA:  Chest pain and shortness of breath. Chronic nonproductive cough. Six months status post CABG. EXAM: CHEST - 2 VIEW COMPARISON:  01/15/2018 and 10/27/2017 FINDINGS: Stable mild-to-moderate cardiomegaly. Pleural-parenchymal scarring in the left lung base remains stable. Minimal scarring in the right lower lung is also unchanged. No evidence of acute infiltrate or edema. No evidence of pleural effusion. Prior CABG again noted. IMPRESSION: Stable cardiomegaly and left greater than right basilar scarring. No active disease. Electronically Signed   By: Earle Gell M.D.   On: 01/21/2018 12:46    Procedures Procedures (including critical care time)  Medications Ordered in ED Medications  methylPREDNISolone sodium succinate (SOLU-MEDROL) 125 mg/2 mL injection 125 mg (125 mg Intravenous Given 01/21/18 1351)  ipratropium-albuterol (DUONEB) 0.5-2.5 (3) MG/3ML nebulizer solution 3 mL (3 mLs Nebulization Given 01/21/18 1351)     Initial Impression / Assessment and Plan / ED Course  I have reviewed the triage vital signs and the nursing notes.  Pertinent labs & imaging results that were available during my care of the patient were reviewed by me and considered in my medical decision making (see chart for details).     74yo female with history of CAD, DM, CHF, htn, DM, COPD per pt, hx of CABG and valve surgery in July on eliquis presents with continuing dyspnea and cough for 6 Differential diagnosis for dyspnea includes ACS, PE, COPD exacerbation, CHF exacerbation, anemia,  pneumonia.  Chest x-ray was done which showed no acute abnormalities, no pneumothorax or pneumonia.  EKG was evaluated by me which showed no acute findings.  BNP in 200s, previously greater than 700.  No signs of CHF exacerbation on XR.  Doubt PE given patient on eliquis.   Reports history of emphysema, and suspect given cough, wheezing, dyspnea over months that presentation most consistent with COPD or reactive airway disease. Given solumedrol, nebs.    Recommend steroids, albuterol, follow up with PCP and pulmonology.   Final Clinical Impressions(s) / ED Diagnoses   Final diagnoses:  COPD exacerbation [facility])    ED Discharge Orders         Ordered    DME Nebulizer machine     01/21/18 1634    predniSONE (DELTASONE) 10 MG tablet  Daily     01/21/18 1634           Gareth Morgan, MD 01/21/18 2227

## 2018-01-21 NOTE — Discharge Instructions (Addendum)
Please follow your sugars and watch your diet while you are on the prednisone, this can make them high. Follow up closely with your doctor.

## 2018-01-21 NOTE — ED Triage Notes (Signed)
Pt arrives with EMS from home with sob for a few days, seen for same but no dx. EMS reports wheezing, one duoneb given PTA. Dull left chest pain started last night, 324 ASA and 1 nitro given PTA with improvement. Hx CABG

## 2018-01-26 ENCOUNTER — Institutional Professional Consult (permissible substitution): Payer: Medicare Other | Admitting: Pulmonary Disease

## 2018-01-26 ENCOUNTER — Ambulatory Visit (INDEPENDENT_AMBULATORY_CARE_PROVIDER_SITE_OTHER): Payer: Medicare Other | Admitting: Pulmonary Disease

## 2018-01-26 ENCOUNTER — Encounter: Payer: Self-pay | Admitting: Pulmonary Disease

## 2018-01-26 VITALS — BP 124/58 | HR 64 | Temp 99.5°F | Ht 63.0 in | Wt 137.6 lb

## 2018-01-26 DIAGNOSIS — Z7722 Contact with and (suspected) exposure to environmental tobacco smoke (acute) (chronic): Secondary | ICD-10-CM | POA: Diagnosis not present

## 2018-01-26 DIAGNOSIS — R0602 Shortness of breath: Secondary | ICD-10-CM

## 2018-01-26 DIAGNOSIS — Z77110 Contact with and (suspected) exposure to air pollution: Secondary | ICD-10-CM

## 2018-01-26 DIAGNOSIS — R0609 Other forms of dyspnea: Secondary | ICD-10-CM

## 2018-01-26 DIAGNOSIS — R059 Cough, unspecified: Secondary | ICD-10-CM

## 2018-01-26 DIAGNOSIS — R05 Cough: Secondary | ICD-10-CM

## 2018-01-26 MED ORDER — PREDNISONE 10 MG PO TABS: ORAL_TABLET | ORAL | 0 refills | Status: DC

## 2018-01-26 MED ORDER — BUDESONIDE-FORMOTEROL FUMARATE 160-4.5 MCG/ACT IN AERO: 2.0000 | INHALATION_SPRAY | Freq: Two times a day (BID) | RESPIRATORY_TRACT | 0 refills | Status: DC

## 2018-01-26 NOTE — Patient Instructions (Addendum)
Thank you for visiting Dr. Valeta Harms at [facility]. Today we recommend the following: Orders Placed This Encounter  Procedures  . Pulmonary function test   Meds ordered this encounter  Medications  . predniSONE (DELTASONE) 10 MG tablet    Sig: Take 67m for 5 days    Dispense:  20 tablet    Refill:  0   We are starting you on Symbicort 160, 2 puffs twice daily.   Return in about 2 weeks (around 02/09/2018).

## 2018-01-26 NOTE — Progress Notes (Signed)
Synopsis: Referred in Jan. 2020 for coughing, pain, headache by Charolette Forward, MD  Subjective:   PATIENT ID: Sharon Spears GENDER: female DOB: Sep 28, 1945, MRN: [id number]  Chief Complaint  Patient presents with  . Consult    States she has been feeling bad for 1 month. States she thinks she has been running fever . She reports having body aches. Currently on augmentin and zpack for URI.     This is a 73 year old with complaints of fever and body aches.  She was recently treated with an upper respiratory tract infection approximately month ago.  She was seen in the emergency room.  She was referred to the pulmonologist office for respiratory complaints following this.  She does have known congestive heart failure.  She is a lifelong non-smoker.  But she did spend nearly 50 years in [place].  She is concerned for air pollution and smog affect to her pulmonary function.  Over the past several weeks she has had progressive dyspnea.  She states that she has been compliant with her congestive heart failure regimen.  She has noticed some mild increase in the swelling of her legs.  She recently followed up with her cardiologist last week.  She does have cough that is productive at times, chest tightness, denies hemoptysis.   Past Medical History:  Diagnosis Date  . Adjustment disorder with anxiety   . Asthma   . Benign essential hypertension   . Coronary artery disease   . Diabetic neuropathic arthritis ([place])   . Heart palpitations   . Uncontrolled diabetes mellitus type 2 without complications ([place])      Family History  Problem Relation Age of Onset  . Asthma Mother   . Alcohol abuse Father      Past Surgical History:  Procedure Laterality Date  . CARDIAC SURGERY    . CESAREAN SECTION    . CORONARY ARTERY BYPASS GRAFT    . ESOPHAGEAL MANOMETRY N/A 03/06/2012   Procedure: ESOPHAGEAL MANOMETRY (EM);  Surgeon: Beryle Beams, MD;  Location: [facility];  Service: Endoscopy;   Laterality: N/A;  . TONSILLECTOMY      Social History   Socioeconomic History  . Marital status: Legally Separated    Spouse name: Not on file  . Number of children: 4  . Years of education: 50  . Highest education level: Not on file  Occupational History  . Occupation: Retired  Scientific laboratory technician  . Financial resource strain: Not on file  . Food insecurity:    Worry: Not on file    Inability: Not on file  . Transportation needs:    Medical: Not on file    Non-medical: Not on file  Tobacco Use  . Smoking status: Never Smoker  . Smokeless tobacco: Never Used  Substance and Sexual Activity  . Alcohol use: No  . Drug use: No  . Sexual activity: Yes  Lifestyle  . Physical activity:    Days per week: Not on file    Minutes per session: Not on file  . Stress: Not on file  Relationships  . Social connections:    Talks on phone: Not on file    Gets together: Not on file    Attends religious service: Not on file    Active member of club or organization: Not on file    Attends meetings of clubs or organizations: Not on file    Relationship status: Not on file  . Intimate partner violence:    Fear  of current or ex partner: Not on file    Emotionally abused: Not on file    Physically abused: Not on file    Forced sexual activity: Not on file  Other Topics Concern  . Not on file  Social History Narrative   Fun/Hobby: Gardening    Denies abuse and feels safe at home.      Allergies  Allergen Reactions  . Adhesive [Tape] Itching  . Penicillins Other (See Comments)    GI upset Has patient had a PCN reaction causing immediate rash, facial/tongue/throat swelling, SOB or lightheadedness with hypotension: No Has patient had a PCN reaction causing severe rash involving mucus membranes or skin necrosis: No Has patient had a PCN reaction that required hospitalization: No Has patient had a PCN reaction occurring within the last 10 years: No If all of the above answers are "NO", then  may proceed with Cephalosporin use.     Outpatient Medications Prior to Visit  Medication Sig Dispense Refill  . acetaminophen (TYLENOL) 500 MG tablet Take 1,000 mg by mouth every 6 (six) hours as needed (pain).     Marland Kitchen albuterol (PROVENTIL HFA;VENTOLIN HFA) 108 (90 BASE) MCG/ACT inhaler Inhale 2 puffs into the lungs every 6 (six) hours as needed for wheezing.     Marland Kitchen albuterol (PROVENTIL) (2.5 MG/3ML) 0.083% nebulizer solution Take 3 mLs (2.5 mg total) by nebulization every 6 (six) hours as needed for wheezing or shortness of breath. (Patient not taking: Reported on 01/15/2018) 75 mL 12  . amoxicillin-clavulanate (AUGMENTIN) 875-125 MG tablet Take 1 tablet by mouth 2 (two) times daily. One po bid x 7 days 14 tablet 0  . apixaban (ELIQUIS) 5 MG TABS tablet Take 1 tablet (5 mg total) by mouth 2 (two) times daily. 60 tablet 3  . azithromycin (ZITHROMAX) 250 MG tablet Take 1 tablet (250 mg total) by mouth daily. 4 tablet 0  . famotidine (PEPCID) 20 MG tablet Take 20 mg by mouth daily before breakfast.   3  . ferrous sulfate 325 (65 FE) MG tablet Take 1 tablet (325 mg total) by mouth 3 (three) times daily with meals. 90 tablet 3  . fluticasone (FLONASE) 50 MCG/ACT nasal spray Place 2 sprays into both nostrils 2 (two) times daily. (Patient taking differently: Place 1 spray into both nostrils daily as needed for allergies or rhinitis. ) 1 g 2  . furosemide (LASIX) 20 MG tablet Take 2 tablets (40 mg total) by mouth daily. (Patient taking differently: Take 20 mg by mouth 2 (two) times daily. ) 30 tablet 0  . losartan (COZAAR) 25 MG tablet Take 25 mg by mouth 2 (two) times daily.    . metFORMIN (GLUCOPHAGE XR) 500 MG 24 hr tablet Take 1 tablet (500 mg total) by mouth daily with breakfast. (Patient taking differently: Take 500 mg by mouth 2 (two) times daily. ) 30 tablet 2  . metoprolol succinate (TOPROL-XL) 50 MG 24 hr tablet Take 50 mg by mouth daily. Take with or immediately following a meal.    . Multiple  Vitamin (MULTIVITAMIN WITH MINERALS) TABS tablet Take 1 tablet by mouth daily. 30 tablet 3  . nitroGLYCERIN (NITROSTAT) 0.4 MG SL tablet Place 0.4 mg under the tongue every 5 (five) minutes as needed for chest pain.      No facility-administered medications prior to visit.     Review of Systems  Constitutional: Positive for chills, fever and malaise/fatigue. Negative for weight loss.  HENT: Negative for hearing loss, sore throat and  tinnitus.   Eyes: Negative for blurred vision and double vision.  Respiratory: Positive for cough, sputum production, shortness of breath and wheezing. Negative for hemoptysis and stridor.   Cardiovascular: Negative for chest pain, palpitations, orthopnea, leg swelling and PND.  Gastrointestinal: Negative for abdominal pain, constipation, diarrhea, heartburn, nausea and vomiting.  Genitourinary: Negative for dysuria, hematuria and urgency.  Musculoskeletal: Negative for joint pain and myalgias.  Skin: Negative for itching and rash.  Neurological: Negative for dizziness, tingling, weakness and headaches.  Endo/Heme/Allergies: Negative for environmental allergies. Does not bruise/bleed easily.  Psychiatric/Behavioral: Negative for depression. The patient is not nervous/anxious and does not have insomnia.   All other systems reviewed and are negative.    Objective:  Physical Exam Vitals signs reviewed.  Constitutional:      General: She is not in acute distress.    Appearance: She is well-developed.  HENT:     Head: Normocephalic and atraumatic.  Eyes:     General: No scleral icterus.    Conjunctiva/sclera: Conjunctivae normal.     Pupils: Pupils are equal, round, and reactive to light.  Neck:     Musculoskeletal: Neck supple.     Vascular: No JVD.     Trachea: No tracheal deviation.  Cardiovascular:     Rate and Rhythm: Normal rate and regular rhythm.     Heart sounds: Murmur present.  Pulmonary:     Effort: Pulmonary effort is normal. No  tachypnea, accessory muscle usage or respiratory distress.     Breath sounds: Normal breath sounds. No stridor. No wheezing, rhonchi or rales.  Abdominal:     General: Bowel sounds are normal. There is no distension.     Palpations: Abdomen is soft.     Tenderness: There is no abdominal tenderness.  Musculoskeletal:        General: Swelling (BL LE ) present. No tenderness.  Lymphadenopathy:     Cervical: No cervical adenopathy.  Skin:    General: Skin is warm and dry.     Capillary Refill: Capillary refill takes less than 2 seconds.     Findings: No rash.  Neurological:     Mental Status: She is alert and oriented to person, place, and time.  Psychiatric:        Behavior: Behavior normal.      Vitals:   01/26/18 1650  BP: (!) 124/58  Pulse: 64  Temp: 99.5 F (37.5 C)  TempSrc: Oral  SpO2: 95%  Weight: 137 lb 9.6 oz (62.4 kg)  Height: _0  (1.6 m)   95% on RA BMI Readings from Last 3 Encounters:  01/26/18 24.37 kg/m  01/21/18 24.80 kg/m  01/15/18 25.15 kg/m   Wt Readings from Last 3 Encounters:  01/26/18 137 lb 9.6 oz (62.4 kg)  01/21/18 140 lb (63.5 kg)  01/15/18 142 lb (64.4 kg)     CBC    Component Value Date/Time   WBC 6.6 01/21/2018 1117   RBC 3.92 01/21/2018 1117   HGB 9.3 (L) 01/21/2018 1117   HCT 30.0 (L) 01/21/2018 1117   PLT 265 01/21/2018 1117   MCV 76.5 (L) 01/21/2018 1117   MCH 23.7 (L) 01/21/2018 1117   MCHC 31.0 01/21/2018 1117   RDW 16.3 (H) 01/21/2018 1117   LYMPHSABS 0.9 01/15/2018 1635   MONOABS 0.5 01/15/2018 1635   EOSABS 0.0 01/15/2018 1635   BASOSABS 0.0 01/15/2018 1635    Chest Imaging: CXR - 01/21/2018 IMPRESSION: Stable cardiomegaly and left greater than right basilar scarring. No active  disease. The patient's images have been independently reviewed by me.    Pulmonary Functions Testing Results: No flowsheet data found.  FeNO: None   Pathology: None   Echocardiogram:  Study Conclusions  - Left ventricle: The  cavity size was normal. Systolic function was   mildly reduced. The estimated ejection fraction was in the range   of 45% to 50%. Wall motion was normal; there were no regional   wall motion abnormalities. - Aortic valve: Valve area (VTI): 1.39 cm^2. Valve area (Vmax):   1.59 cm^2. Valve area (Vmean): 1.52 cm^2. - Mitral valve: Calcified annulus. Moderately calcified leaflets .   The findings are consistent with moderate stenosis. Valve area by   continuity equation (using LVOT flow): 0.84 cm^2. - Left atrium: The atrium was mildly dilated. - Right atrium: The atrium was mildly dilated. - Atrial septum: No defect or patent foramen ovale was identified. - Tricuspid valve: There was mild-moderate regurgitation. - Pulmonary arteries: Systolic pressure was moderately increased.   PA peak pressure: 54 mm Hg (S). - Pericardium, extracardiac: There was a left pleural effusion.  Heart Catheterization: no reports I can see    Assessment & Plan:   DOE (dyspnea on exertion) - Plan: Pulmonary function test, Respiratory virus panel, Respiratory virus panel  SOB (shortness of breath) - Plan: Pulmonary function test  Cough - Plan: Respiratory virus panel, Respiratory virus panel  Second hand smoke exposure  Exposure to air pollution  Discussion:  This is a 73 year old with significant dyspnea on exertion, shortness of breath and a few day history of low-grade temperatures and body aches.  We will screen today with a viral respiratory panel.  However further questioning with most of her symptomatology has been going on and off for approximately a month.  She does have significant secondhand smoke exposure in air pollution exposure for living in [place] for nearly 50 years.  She does have symptoms consistent with chronic heart failure.  She is currently managed by cardiology for this.  I think we should start with the following: We will give her 40 mg of prednisone for the next 5  days Start her on a new inhaler, Symbicort 160, 2 puffs twice daily Continue albuterol for shortness of breath and wheezing. She needs full PFTs completed at next office visit. She can follow-up with [place] in approximately 2 weeks to make sure that she is improving. We will call her with any results from the viral respiratory screen. Patient instructed that if she continues to get worse or has recurrent fevers I would have low threshold for two-view chest x-ray.  She is to call our office if this occurs.  Follow-up appointment to be scheduled in 2 weeks with Sharon Quaker, NP.  Currently scheduled for 02/08/2018.  Greater than 50% of this patient 60-minute office visit was spent face-to-face discussing the above recommendations and treatment plan.  As well as the initiation of a new inhaler regimen.  And counseling on obtaining pulmonary function tests prior to next visit.    Current Outpatient Medications:  .  acetaminophen (TYLENOL) 500 MG tablet, Take 1,000 mg by mouth every 6 (six) hours as needed (pain). , Disp: , Rfl:  .  albuterol (PROVENTIL HFA;VENTOLIN HFA) 108 (90 BASE) MCG/ACT inhaler, Inhale 2 puffs into the lungs every 6 (six) hours as needed for wheezing. , Disp: , Rfl:  .  albuterol (PROVENTIL) (2.5 MG/3ML) 0.083% nebulizer solution, Take 3 mLs (2.5 mg total) by nebulization every 6 (six) hours  as needed for wheezing or shortness of breath. (Patient not taking: Reported on 01/15/2018), Disp: 75 mL, Rfl: 12 .  amoxicillin-clavulanate (AUGMENTIN) 875-125 MG tablet, Take 1 tablet by mouth 2 (two) times daily. One po bid x 7 days, Disp: 14 tablet, Rfl: 0 .  apixaban (ELIQUIS) 5 MG TABS tablet, Take 1 tablet (5 mg total) by mouth 2 (two) times daily., Disp: 60 tablet, Rfl: 3 .  azithromycin (ZITHROMAX) 250 MG tablet, Take 1 tablet (250 mg total) by mouth daily., Disp: 4 tablet, Rfl: 0 .  famotidine (PEPCID) 20 MG tablet, Take 20 mg by mouth daily before breakfast. , Disp: , Rfl: 3 .  ferrous  sulfate 325 (65 FE) MG tablet, Take 1 tablet (325 mg total) by mouth 3 (three) times daily with meals., Disp: 90 tablet, Rfl: 3 .  fluticasone (FLONASE) 50 MCG/ACT nasal spray, Place 2 sprays into both nostrils 2 (two) times daily. (Patient taking differently: Place 1 spray into both nostrils daily as needed for allergies or rhinitis. ), Disp: 1 g, Rfl: 2 .  furosemide (LASIX) 20 MG tablet, Take 2 tablets (40 mg total) by mouth daily. (Patient taking differently: Take 20 mg by mouth 2 (two) times daily. ), Disp: 30 tablet, Rfl: 0 .  losartan (COZAAR) 25 MG tablet, Take 25 mg by mouth 2 (two) times daily., Disp: , Rfl:  .  metFORMIN (GLUCOPHAGE XR) 500 MG 24 hr tablet, Take 1 tablet (500 mg total) by mouth daily with breakfast. (Patient taking differently: Take 500 mg by mouth 2 (two) times daily. ), Disp: 30 tablet, Rfl: 2 .  metoprolol succinate (TOPROL-XL) 50 MG 24 hr tablet, Take 50 mg by mouth daily. Take with or immediately following a meal., Disp: , Rfl:  .  Multiple Vitamin (MULTIVITAMIN WITH MINERALS) TABS tablet, Take 1 tablet by mouth daily., Disp: 30 tablet, Rfl: 3 .  nitroGLYCERIN (NITROSTAT) 0.4 MG SL tablet, Place 0.4 mg under the tongue every 5 (five) minutes as needed for chest pain. , Disp: , Rfl:    [name], DO [facility] 01/26/2018 5:08 PM

## 2018-01-27 ENCOUNTER — Telehealth: Payer: Self-pay

## 2018-01-27 ENCOUNTER — Encounter: Payer: Self-pay | Admitting: Pulmonary Disease

## 2018-01-27 LAB — RESPIRATORY VIRUS PANEL
Influenza A RNA: NOT DETECTED
Influenza B RNA: NOT DETECTED
RSV RNA: NOT DETECTED
hMPV: DETECTED — AB

## 2018-01-27 NOTE — Telephone Encounter (Signed)
PCCM:  Human Metapneumovirus  Its a very common respiratory virus that causes the common cold.  [name], DO [facility] 01/27/2018 2:32 PM

## 2018-01-27 NOTE — Telephone Encounter (Signed)
[name] from the lab called to report the results from the respiratory culture on this pt. Dr. Cherre Robins  HMPV-positive Flu A neg Flu B neg RSV neg

## 2018-01-27 NOTE — Telephone Encounter (Signed)
PCCM:  Thanks, you can let the patient know the results. Continue supportive care. She should improve with time. She has follow up with [place] already scheduled.   [name], DO [facility] 01/27/2018 12:41 PM

## 2018-01-27 NOTE — Telephone Encounter (Signed)
Dr. Valeta Harms can you explain what hmpv is? Send back to the triage pool/ Thanks

## 2018-01-27 NOTE — Telephone Encounter (Signed)
Advised pt of results. Pt understood and nothing further is needed.   

## 2018-02-07 NOTE — Progress Notes (Signed)
_0  ID: Sharon Spears, female    DOB: [birth year], 73 y.o.   MRN: [id number]  Chief Complaint  Patient presents with  . Follow-up    review PFT results     Referring provider: Charolette Forward, MD  HPI:  73 year old female never smoker but with prolonged smoke exposure living in [place] for over 50 years referred to our office on 01/26/2018 for prolonged dyspnea  PMH: Congestive heart failure Smoker/ Smoking History: Never smoker Maintenance: Symbicort 160 Pt of: Sharon Spears  02/08/2018  - Visit   73 year old female never smoker presenting to our office today for a follow-up visit.  Patient is a never smoker but did live in [place] and reports extensive passive smoke exposure from family member smoking as well as pollution inside the city.  At last office visit patient was started on Symbicort 160 and given a prednisone taper.  Patient reports that her shortness of breath is significantly improved from last office visit.  Patient does report that she is still occasionally short of breath but nowhere near as severe as before.  Patient's lower extremity swelling is also more maintained.  Sharon Spears function test results listed below:  02/08/2018-pulmonary function test - FVC 1.46 (58% predicted), postbronchodilator ratio 82, postbronchodilator FEV1 1.19 (63% predicted), no significant bronchodilator response, slight mid flow reversibility after administration of bronchodilators, DLCO is 67 which corrects to 79 based off of hemoglobin.  MMRC - Breathlessness Score 1 - I get short of breath when hurrying on level ground or walking up a slight hill     Tests:   02/08/2018-pulmonary function test - FVC 1.46 (58% predicted), postbronchodilator ratio 82, postbronchodilator FEV1 1.19 (63% predicted), no significant bronchodilator response, slight mid flow reversibility after administration of bronchodilators, DLCO is 67 which corrects to 79 based off of hemoglobin.  Echocardiogram:    Study Conclusions  - Left ventricle: The cavity size was normal. Systolic function was   mildly reduced. The estimated ejection fraction was in the range   of 45% to 50%. Wall motion was normal; there were no regional   wall motion abnormalities. - Aortic valve: Valve area (VTI): 1.39 cm^2. Valve area (Vmax):   1.59 cm^2. Valve area (Vmean): 1.52 cm^2. - Mitral valve: Calcified annulus. Moderately calcified leaflets .   The findings are consistent with moderate stenosis. Valve area by   continuity equation (using LVOT flow): 0.84 cm^2. - Left atrium: The atrium was mildly dilated. - Right atrium: The atrium was mildly dilated. - Atrial septum: No defect or patent foramen ovale was identified. - Tricuspid valve: There was mild-moderate regurgitation. - Pulmonary arteries: Systolic pressure was moderately increased.   PA peak pressure: 54 mm Hg (S). - Pericardium, extracardiac: There was a left pleural effusion.  FENO:  No results found for: NITRICOXIDE  PFT: PFT Results Latest Ref Rng & Units 02/08/2018  FVC-Pre L 1.46  FVC-Predicted Pre % 58  FVC-Post L 1.46  FVC-Predicted Post % 58  Pre FEV1/FVC % % 80  Post FEV1/FCV % % 82  FEV1-Pre L 1.17  FEV1-Predicted Pre % 62  FEV1-Post L 1.19  DLCO UNC% % 67  DLCO COR %Predicted % 112  TLC L 3.41  TLC % Predicted % 76  RV % Predicted % 92    Imaging: Dg Chest 2 View  Result Date: 01/21/2018 CLINICAL DATA:  Chest pain and shortness of breath. Chronic nonproductive cough. Six months status post CABG. EXAM: CHEST - 2 VIEW COMPARISON:  01/15/2018 and  10/27/2017 FINDINGS: Stable mild-to-moderate cardiomegaly. Pleural-parenchymal scarring in the left lung base remains stable. Minimal scarring in the right lower lung is also unchanged. No evidence of acute infiltrate or edema. No evidence of pleural effusion. Prior CABG again noted. IMPRESSION: Stable cardiomegaly and left greater than right basilar scarring. No active disease.  Electronically Signed   By: Earle Gell M.D.   On: 01/21/2018 12:46   Dg Chest 2 View  Result Date: 01/15/2018 CLINICAL DATA:  Shortness of breath, cough, body aches and chills, fever. EXAM: CHEST - 2 VIEW COMPARISON:  Chest radiograph October 27, 2017 FINDINGS: Stable small LEFT pleural effusion. Stable cardiomegaly. Status post median sternotomy for cardiac valve replacement. Bibasilar strandy densities, increased on the RIGHT. No pneumothorax. Soft tissue planes and included osseous structures are nonacute. IMPRESSION: 1. Increased bibasilar atelectasis. Stable small LEFT pleural effusion. 2. Stable cardiomegaly. Electronically Signed   By: Elon Alas M.D.   On: 01/15/2018 17:36      Specialty Problems      Pulmonary Problems   Acute on chronic respiratory failure with hypoxia (HCC)   Asthma exacerbation   Laryngitis, acute   Cough   DOE (dyspnea on exertion)      Allergies  Allergen Reactions  . Adhesive [Tape] Itching  . Penicillins Other (See Comments)    GI upset Has patient had a PCN reaction causing immediate rash, facial/tongue/throat swelling, SOB or lightheadedness with hypotension: No Has patient had a PCN reaction causing severe rash involving mucus membranes or skin necrosis: No Has patient had a PCN reaction that required hospitalization: No Has patient had a PCN reaction occurring within the last 10 years: No If all of the above answers are "NO", then may proceed with Cephalosporin use.    Immunization History  Administered Date(s) Administered  . Influenza,inj,Quad PF,6+ Mos 11/21/2015   Had flu shot in rehab   Past Medical History:  Diagnosis Date  . Adjustment disorder with anxiety   . Asthma   . Benign essential hypertension   . Coronary artery disease   . Diabetic neuropathic arthritis ([place])   . Heart palpitations   . Uncontrolled diabetes mellitus type 2 without complications ([place])     Tobacco History: Social History   Tobacco Use    Smoking Status Never Smoker  Smokeless Tobacco Never Used   Counseling given: Yes  Continue to not smoke  Outpatient Encounter Medications as of 02/08/2018  Medication Sig  . acetaminophen (TYLENOL) 500 MG tablet Take 1,000 mg by mouth every 6 (six) hours as needed (pain).   Marland Kitchen albuterol (PROVENTIL HFA;VENTOLIN HFA) 108 (90 BASE) MCG/ACT inhaler Inhale 2 puffs into the lungs every 6 (six) hours as needed for wheezing.   Marland Kitchen albuterol (PROVENTIL) (2.5 MG/3ML) 0.083% nebulizer solution Take 3 mLs (2.5 mg total) by nebulization every 6 (six) hours as needed for wheezing or shortness of breath.  Marland Kitchen apixaban (ELIQUIS) 5 MG TABS tablet Take 1 tablet (5 mg total) by mouth 2 (two) times daily.  . budesonide-formoterol (SYMBICORT) 160-4.5 MCG/ACT inhaler Inhale 2 puffs into the lungs every 12 (twelve) hours.  . famotidine (PEPCID) 20 MG tablet Take 20 mg by mouth daily before breakfast.   . ferrous sulfate 325 (65 FE) MG tablet Take 1 tablet (325 mg total) by mouth 3 (three) times daily with meals.  . fluticasone (FLONASE) 50 MCG/ACT nasal spray Place 2 sprays into both nostrils 2 (two) times daily. (Patient taking differently: Place 1 spray into both nostrils daily as needed for  allergies or rhinitis. )  . furosemide (LASIX) 20 MG tablet Take 2 tablets (40 mg total) by mouth daily. (Patient taking differently: Take 20 mg by mouth 2 (two) times daily. )  . losartan (COZAAR) 25 MG tablet Take 25 mg by mouth 2 (two) times daily.  . metFORMIN (GLUCOPHAGE XR) 500 MG 24 hr tablet Take 1 tablet (500 mg total) by mouth daily with breakfast. (Patient taking differently: Take 500 mg by mouth 2 (two) times daily. )  . metoprolol succinate (TOPROL-XL) 50 MG 24 hr tablet Take 50 mg by mouth daily. Take with or immediately following a meal.  . Multiple Vitamin (MULTIVITAMIN WITH MINERALS) TABS tablet Take 1 tablet by mouth daily.  . nitroGLYCERIN (NITROSTAT) 0.4 MG SL tablet Place 0.4 mg under the tongue every 5 (five)  minutes as needed for chest pain.   . predniSONE (DELTASONE) 10 MG tablet Take 45m for 5 days  . omeprazole (PRILOSEC) 20 MG capsule Take 1 capsule (20 mg total) by mouth daily.  . [DISCONTINUED] amoxicillin-clavulanate (AUGMENTIN) 875-125 MG tablet Take 1 tablet by mouth 2 (two) times daily. One po bid x 7 days (Patient not taking: Reported on 02/08/2018)  . [DISCONTINUED] azithromycin (ZITHROMAX) 250 MG tablet Take 1 tablet (250 mg total) by mouth daily. (Patient not taking: Reported on 02/08/2018)   No facility-administered encounter medications on file as of 02/08/2018.      Review of Systems  Review of Systems  Constitutional: Negative for fatigue and fever.  HENT: Negative for congestion, sinus pressure and sinus pain.   Respiratory: Positive for cough (occasionally, cough is mainly dry, sometimes white mucous), chest tightness (very little ) and shortness of breath (improved from last ov ). Negative for wheezing.   Cardiovascular: Negative for chest pain and palpitations.  Gastrointestinal: Negative for diarrhea, nausea and vomiting.       +acid reflux symptoms daily     Physical Exam  BP (!) 120/54 (BP Location: Left Arm, Cuff Size: Normal)   Pulse 67   Ht 5' (1.524 m)   Wt 137 lb (62.1 kg)   SpO2 95%   BMI 26.76 kg/m   Wt Readings from Last 5 Encounters:  02/08/18 137 lb (62.1 kg)  02/07/18 140 lb (63.5 kg)  01/26/18 137 lb 9.6 oz (62.4 kg)  01/21/18 140 lb (63.5 kg)  01/15/18 142 lb (64.4 kg)    Physical Exam  Constitutional: She is oriented to person, place, and time and well-developed, well-nourished, and in no distress. No distress.  HENT:  Head: Normocephalic and atraumatic.  Right Ear: Hearing, tympanic membrane, external ear and ear canal normal.  Left Ear: Hearing, tympanic membrane, external ear and ear canal normal.  Nose: Nose normal. Right sinus exhibits no maxillary sinus tenderness and no frontal sinus tenderness. Left sinus exhibits no maxillary  sinus tenderness and no frontal sinus tenderness.  Mouth/Throat: Uvula is midline and oropharynx is clear and moist. No oropharyngeal exudate.  Eyes: Pupils are equal, round, and reactive to light.  Neck: Normal range of motion. Neck supple.  Cardiovascular: Normal rate, regular rhythm and normal heart sounds.  Pulmonary/Chest: Effort normal and breath sounds normal. No accessory muscle usage. No respiratory distress. She has no decreased breath sounds. She has no wheezes. She has no rhonchi.  Musculoskeletal: Normal range of motion.        General: Edema (1+ lower extremity swelling) present.  Lymphadenopathy:    She has no cervical adenopathy.  Neurological: She is alert and oriented to  person, place, and time. Gait normal.  Skin: Skin is warm and dry. She is not diaphoretic. No erythema.  Psychiatric: Mood, memory, affect and judgment normal.  Nursing note and vitals reviewed.     Lab Results:  CBC    Component Value Date/Time   WBC 6.6 01/21/2018 1117   RBC 3.92 01/21/2018 1117   HGB 9.3 (L) 01/21/2018 1117   HCT 30.0 (L) 01/21/2018 1117   PLT 265 01/21/2018 1117   MCV 76.5 (L) 01/21/2018 1117   MCH 23.7 (L) 01/21/2018 1117   MCHC 31.0 01/21/2018 1117   RDW 16.3 (H) 01/21/2018 1117   LYMPHSABS 0.9 01/15/2018 1635   MONOABS 0.5 01/15/2018 1635   EOSABS 0.0 01/15/2018 1635   BASOSABS 0.0 01/15/2018 1635    BMET    Component Value Date/Time   NA 136 01/21/2018 1117   K 3.2 (L) 01/21/2018 1117   CL 103 01/21/2018 1117   CO2 23 01/21/2018 1117   GLUCOSE 177 (H) 01/21/2018 1117   BUN 12 01/21/2018 1117   CREATININE 0.82 01/21/2018 1117   CALCIUM 8.8 (L) 01/21/2018 1117   GFRNONAA >60 01/21/2018 1117   GFRAA >60 01/21/2018 1117    BNP    Component Value Date/Time   BNP 217.9 (H) 01/21/2018 1505    ProBNP No results found for: PROBNP    Assessment & Plan:    DOE (dyspnea on exertion) Assessment: Improved dyspnea on exertion today Pulmonary function  testing today does not indicate COPD, slight mid flow reversibility which could indicate asthma Maintained well on Symbicort 160 Patient recently finished prednisone taper Lungs clear to auscultation today Uncontrolled acid reflux based off of patient's ROS  Plan: Continue Symbicort 160 twice a day Start omeprazole 20 mg daily Use rescue inhaler every 4-6 hours as needed for shortness of breath or wheezing Follow-up with our office in about 3 months to see how you are doing At next office visit consider blood work checking for eosinophilia or IgE    Cough Assessment: Daily epigastric pain and acid reflux symptoms Improved cough since taking prednisone taper, antibiotics, starting Symbicort 160 Lungs clear to auscultation today Reviewed pulmonary function test today with patient  Plan: Continue Symbicort 160 at this time Use rescue inhaler every 4-6 hours as needed for shortness of breath or wheezing We will start omeprazole 20 mg for suspected GERD If coughing and shortness of breath flares again could consider lab work checking for eosinophilia as well as a FeNO Follow-up in 3 months   Acute on chronic left systolic heart failure ([place]) Plan: Continue regular follow-up with Dr. Adalberto Cole, NP 02/08/2018   This appointment was 30 min long with over 50% of the time in direct face-to-face patient care, assessment, plan of care, and follow-up.

## 2018-02-07 NOTE — Congregational Nurse Program (Signed)
  Dept: [facility] Nurse Program Note  Date of Encounter: 02/07/2018  Past Medical History: Past Medical History:  Diagnosis Date  . Adjustment disorder with anxiety   . Asthma   . Benign essential hypertension   . Coronary artery disease   . Diabetic neuropathic arthritis ([place])   . Heart palpitations   . Uncontrolled diabetes mellitus type 2 without complications [facility])     Encounter Details: CNP Questionnaire - 02/07/18 1923      Questionnaire   Patient Status  Not Applicable    Race  Hispanic or Latino    Location Patient Served At  Not Applicable    Insurance  Medicare    Uninsured  Not Applicable    Food  Yes, have food insecurities;Within past 12 months, worried food would run out with no money to buy more    Housing/Utilities  No permanent housing    Transportation  No transportation needs    Interpersonal Safety  Yes, feel physically and emotionally safe where you currently live    Medication  No medication insecurities    Medical Provider  Yes    Referrals  Area Agency;Primary Care Provider/Clinic    ED Visit Averted  Not Applicable    Life-Saving Intervention Made  Not Applicable      Introduced client to student SW ,first initial visit since admission to [facility]. States she had to leave her apartment because she didn't feel safe . There were issues with a chandelier that feel from the ceiling and she felt her life was in danger . Left her furniture and household belongings ,now facing a court battle because the contract was broken . Case managemnet working on her housing issues.States she is seen by a pulmonary doctor and cardiologist and feel they are providing her with good care. Takes her medications and does her breathing treatments twice daily. Has an appointment tomorrow to see her pulmonary doctor at 68 am . Has a car , and has health insurance . She gets $44 per month in food stamps.takes her blood sugars daily this am her blood sugar ws 141 mg  usually runs from 125-140 mg ,her A!-C is usually 7.Understand importance of taking her medications . Talked about a bad reaction with an antibiotic she was put on several months ago and almost died she states. States she had a drug reaction with her blood thinner. Doesn't have a PCP,counseled regarding getting a PCP and a referral was made ,she is to call for an appointment . Refused to get  a finger stick by nurse states she will and has been doing her own checks. Moved here from [place] has been here some years . Has a daughter that lives in [place], a son that lives in [place]. No family here but lots of friends . Over the last few months has lived in a hotel it got costly and slept in her car after she moved out of her apartment on [address]. Will follow up weekly ,check B/P and follow up on PCP appointment.

## 2018-02-07 NOTE — Congregational Nurse Program (Signed)
  Dept: [facility] Nurse Program Note  Date of Encounter: 01/31/2018  Past Medical History: Past Medical History:  Diagnosis Date  . Adjustment disorder with anxiety   . Asthma   . Benign essential hypertension   . Coronary artery disease   . Diabetic neuropathic arthritis ([place])   . Heart palpitations   . Uncontrolled diabetes mellitus type 2 without complications [facility])     Encounter Details: CNP Questionnaire - 02/07/18 1918      Questionnaire   Patient Status  Not Applicable    Race  Hispanic or Latino    Location Patient Served At  Not Applicable    Insurance  Medicare    Uninsured  Not Applicable    Food  Within past 12 months, worried food would run out with no money to buy more    Housing/Utilities  No permanent housing    Transportation  No transportation needs    Interpersonal Safety  Yes, feel physically and emotionally safe where you currently live    Medication  No medication insecurities    Medical Provider  Yes    Referrals  Primary Care Provider/Clinic    ED Visit Averted  Not Applicable    Life-Saving Intervention Made  Not Applicable     Client was admitted to [facility] today ,referred by case manager due to diabetes and COPD. Nurse went to client's room introduced self and will meet with client after educational session today . Client agrees. Session ran over client had to go to dinner and had another class ,will do intake next week .

## 2018-02-08 ENCOUNTER — Ambulatory Visit (INDEPENDENT_AMBULATORY_CARE_PROVIDER_SITE_OTHER): Payer: Medicare Other | Admitting: Pulmonary Disease

## 2018-02-08 ENCOUNTER — Encounter: Payer: Self-pay | Admitting: Pulmonary Disease

## 2018-02-08 VITALS — BP 120/54 | HR 67 | Ht 60.0 in | Wt 137.0 lb

## 2018-02-08 DIAGNOSIS — I5023 Acute on chronic systolic (congestive) heart failure: Secondary | ICD-10-CM | POA: Diagnosis not present

## 2018-02-08 DIAGNOSIS — R05 Cough: Secondary | ICD-10-CM | POA: Diagnosis not present

## 2018-02-08 DIAGNOSIS — R0602 Shortness of breath: Secondary | ICD-10-CM

## 2018-02-08 DIAGNOSIS — R059 Cough, unspecified: Secondary | ICD-10-CM | POA: Insufficient documentation

## 2018-02-08 DIAGNOSIS — R0609 Other forms of dyspnea: Secondary | ICD-10-CM | POA: Insufficient documentation

## 2018-02-08 LAB — PULMONARY FUNCTION TEST
DL/VA % pred: 112 %
DL/VA: 4.77 ml/min/mmHg/L
DLCO COR: 15 ml/min/mmHg
DLCO cor % pred: 79 %
DLCO unc % pred: 67 %
DLCO unc: 12.69 ml/min/mmHg
FEF 25-75 Post: 1.24 L/sec
FEF 25-75 Pre: 1.08 L/sec
FEF2575-%Change-Post: 15 %
FEF2575-%Pred-Post: 76 %
FEF2575-%Pred-Pre: 66 %
FEV1-%Change-Post: 1 %
FEV1-%Pred-Post: 63 %
FEV1-%Pred-Pre: 62 %
FEV1-Post: 1.19 L
FEV1-Pre: 1.17 L
FEV1FVC-%Change-Post: 1 %
FEV1FVC-%Pred-Pre: 106 %
FEV6-%Change-Post: 1 %
FEV6-%PRED-PRE: 60 %
FEV6-%Pred-Post: 61 %
FEV6-Post: 1.46 L
FEV6-Pre: 1.44 L
FEV6FVC-%Change-Post: 1 %
FEV6FVC-%Pred-Post: 105 %
FEV6FVC-%Pred-Pre: 103 %
FVC-%Change-Post: 0 %
FVC-%Pred-Post: 58 %
FVC-%Pred-Pre: 58 %
FVC-Post: 1.46 L
FVC-Pre: 1.46 L
PRE FEV1/FVC RATIO: 80 %
Post FEV1/FVC ratio: 82 %
Post FEV6/FVC ratio: 100 %
Pre FEV6/FVC Ratio: 98 %
RV % pred: 92 %
RV: 1.86 L
TLC % pred: 76 %
TLC: 3.41 L

## 2018-02-08 MED ORDER — OMEPRAZOLE 20 MG PO CPDR: 20.0000 mg | DELAYED_RELEASE_CAPSULE | Freq: Every day | ORAL | 4 refills | Status: DC

## 2018-02-08 NOTE — Assessment & Plan Note (Signed)
Assessment: Daily epigastric pain and acid reflux symptoms Improved cough since taking prednisone taper, antibiotics, starting Symbicort 160 Lungs clear to auscultation today Reviewed pulmonary function test today with patient  Plan: Continue Symbicort 160 at this time Use rescue inhaler every 4-6 hours as needed for shortness of breath or wheezing We will start omeprazole 20 mg for suspected GERD If coughing and shortness of breath flares again could consider lab work checking for eosinophilia as well as a FeNO Follow-up in 3 months

## 2018-02-08 NOTE — Progress Notes (Signed)
PFT done today. 

## 2018-02-08 NOTE — Assessment & Plan Note (Signed)
Assessment: Improved dyspnea on exertion today Pulmonary function testing today does not indicate COPD, slight mid flow reversibility which could indicate asthma Maintained well on Symbicort 160 Patient recently finished prednisone taper Lungs clear to auscultation today Uncontrolled acid reflux based off of patient's ROS  Plan: Continue Symbicort 160 twice a day Start omeprazole 20 mg daily Use rescue inhaler every 4-6 hours as needed for shortness of breath or wheezing Follow-up with our office in about 3 months to see how you are doing At next office visit consider blood work checking for eosinophilia or IgE

## 2018-02-08 NOTE — Patient Instructions (Addendum)
Continue Symbicort 160 >>> 2 puffs in the morning right when you wake up, rinse out your mouth after use, 12 hours later 2 puffs, rinse after use >>> Take this daily, no matter what >>> This is not a rescue inhaler   Omeprazole 20 mg tablet  >>>Please take 1 tablet daily 15 minutes to 30 minutes before your first meal of the day as well as before your other medications >>>Try to take at the same time each day >>>take this medication daily  GERD management: >>>Avoid laying flat until 2 hours after meals >>>Elevate head of the bed including entire chest >>>Reduce size of meals and amount of fat, acid, spices, caffeine and sweets >>>If you are smoking, Please stop! >>>Decrease alcohol consumption >>>Work on maintaining a healthy weight with normal BMI   Continue follow up with Dr. Terrence Dupont   Follow up with Dr. Valeta Harms in 2-3 months    It is flu season:   >>>Remember to be washing your hands regularly, using hand sanitizer, be careful to use around herself with has contact with people who are sick will increase her chances of getting sick yourself. >>> Best ways to protect herself from the flu: Receive the yearly flu vaccine, practice good hand hygiene washing with soap and also using hand sanitizer when available, eat a nutritious meals, get adequate rest, hydrate appropriately   Please contact the office if your symptoms worsen or you have concerns that you are not improving.   Thank you for choosing [facility] for your healthcare, and for allowing [place] to partner with you on your healthcare journey. I am thankful to be able to provide care to you today.   Wyn Quaker FNP-C

## 2018-02-08 NOTE — Assessment & Plan Note (Signed)
Plan: Continue regular follow-up with Dr. Terrence Dupont

## 2018-02-10 ENCOUNTER — Ambulatory Visit: Payer: Medicare Other | Admitting: Pulmonary Disease

## 2018-02-11 NOTE — Progress Notes (Signed)
PCCM: Agree. Thanks for seeing her.  [facility] 02/11/2018 6:36 PM

## 2018-02-21 NOTE — Congregational Nurse Program (Signed)
  Dept: [facility] Nurse Program Note  Date of Encounter: 02/21/2018  Past Medical History: Past Medical History:  Diagnosis Date  . Adjustment disorder with anxiety   . Asthma   . Benign essential hypertension   . Coronary artery disease   . Diabetic neuropathic arthritis ([place])   . Heart palpitations   . Uncontrolled diabetes mellitus type 2 without complications [facility])     Encounter Details: CNP Questionnaire - 02/21/18 2329      Questionnaire   Patient Status  Not Applicable    Race  Hispanic or Latino    Location Patient [place]  Medicare    Uninsured  Not Applicable    Food  Within past 12 months, worried food would run out with no money to buy more;Within past 12 months, food ran out with no money to buy more    Housing/Utilities  No permanent housing    Transportation  No transportation needs    Interpersonal Safety  Yes, feel physically and emotionally safe where you currently live    Medication  No medication insecurities    Medical Provider  Yes    Referrals  Primary Care Provider/Clinic    ED Visit Averted  Not Applicable    Life-Saving Intervention Made  Not Applicable     In to have blood pressure checked and monitor COPD. Client states things are okay . Has concerns about having a  snack available ,not making it to am breakfast states it is too early and she has been having a lot of sputum with her treatments .Taking  her medications . Today went up to [facility] to get breakfast at 11 am it was lunch time and got a snack bag. Prefers to do her own blood sugars states they are running 130 mg to 140 mg. Feels safe and has started looking at housing options ,to save money and find housing.  Nurse will check with case management about snack . Counseled regarding her vital signs ,diastolic low ,states it is like that sometimes ,no shortness of breath or other signs of distress, color good

## 2018-02-23 ENCOUNTER — Telehealth: Payer: Self-pay | Admitting: Pulmonary Disease

## 2018-02-23 MED ORDER — ALBUTEROL SULFATE (2.5 MG/3ML) 0.083% IN NEBU: 2.5000 mg | INHALATION_SOLUTION | Freq: Four times a day (QID) | RESPIRATORY_TRACT | 12 refills | Status: DC | PRN

## 2018-02-23 NOTE — Addendum Note (Signed)
Addended by: Della Goo C on: 02/23/2018 01:59 PM   Modules accepted: Orders

## 2018-02-23 NOTE — Telephone Encounter (Signed)
Called and spoke with patient, she is requesting refill of her albuterol nebulizer solution. I have attached notes from [place] last OV listed below.   Discussion:  This is a 73 year old with significant dyspnea on exertion, shortness of breath and a few day history of low-grade temperatures and body aches.  We will screen today with a viral respiratory panel.  However further questioning with most of her symptomatology has been going on and off for approximately a month.  She does have significant secondhand smoke exposure in air pollution exposure for living in [place] for nearly 50 years.  She does have symptoms consistent with chronic heart failure.  She is currently managed by cardiology for this.  I think we should start with the following: We will give her 40 mg of prednisone for the next 5 days Start her on a new inhaler, Symbicort 160, 2 puffs twice daily Continue albuterol for shortness of breath and wheezing. She needs full PFTs completed at next office visit. She can follow-up with [place] in approximately 2 weeks to make sure that she is improving. We will call her with any results from the viral respiratory screen. Patient instructed that if she continues to get worse or has recurrent fevers I would have low threshold for two-view chest x-ray.  She is to call our office if this occurs. _____________________________________________________________________________________  Refill has been sent to verified pharmacy. Nothing further needed.

## 2018-03-07 NOTE — Congregational Nurse Program (Signed)
  Dept: [facility] Nurse Program Note  Date of Encounter: 03/07/2018  Past Medical History: Past Medical History:  Diagnosis Date  . Adjustment disorder with anxiety   . Asthma   . Benign essential hypertension   . Coronary artery disease   . Diabetic neuropathic arthritis ([place])   . Heart palpitations   . Uncontrolled diabetes mellitus type 2 without complications [facility])     Encounter Details: CNP Questionnaire - 03/07/18 1842      Questionnaire   Patient Status  Not Applicable    Race  Hispanic or Latino    Location Patient [place]  Medicare    Uninsured  Not Applicable    Food  Within past 12 months, food ran out with no money to buy more;Within past 12 months, worried food would run out with no money to buy more    Housing/Utilities  No permanent housing    Transportation  No transportation needs    Interpersonal Safety  Yes, feel physically and emotionally safe where you currently live    Medication  No medication insecurities    Medical Provider  Yes    Referrals  [place] Provider    ED Visit Averted  Not Applicable    Life-Saving Intervention Made  Not Applicable     Client stopped nurse in hallway and immediately started talking about a relationship problem with another resident . Nurse listened along with SW student and referred client to student to help her work through situation. Suggested she ask for a meeting with her case manager and resident to resolve situation before it gets out of hand. Client receptive to idea. Has a bus pass to go see son in [place] this week end ,looking forward to visit. Client doing okay otherwise  Follow weekly

## 2018-03-15 NOTE — Congregational Nurse Program (Signed)
  Dept: [facility] Nurse Program Note  Date of Encounter: 03/15/2018  Past Medical History: Past Medical History:  Diagnosis Date  . Adjustment disorder with anxiety   . Asthma   . Benign essential hypertension   . Coronary artery disease   . Diabetic neuropathic arthritis ([place])   . Heart palpitations   . Uncontrolled diabetes mellitus type 2 without complications [facility])     Encounter Details: CNP Questionnaire - 03/15/18 1916      Questionnaire   Patient Status  Not Applicable    Race  Hispanic or Latino    Location Patient Served At  Not Applicable    Insurance  Medicare    Uninsured  Not Applicable    Food  Within past 12 months, food ran out with no money to buy more    Housing/Utilities  No permanent housing    Transportation  No transportation needs    Interpersonal Safety  Yes, feel physically and emotionally safe where you currently live    Medication  No medication insecurities    Medical Provider  Yes    Referrals  Primary Care Provider/Clinic      Brief encounter with client as she was taking her medications. Had a pass last week end to visit with son and relatives in [place] she feels visit was too short ,long train ride. Feeling okay today.

## 2018-03-22 NOTE — Congregational Nurse Program (Signed)
  Dept: [facility] Nurse Program Note  Date of Encounter: 03/21/2018  Past Medical History: Past Medical History:  Diagnosis Date  . Adjustment disorder with anxiety   . Asthma   . Benign essential hypertension   . Coronary artery disease   . Diabetic neuropathic arthritis ([place])   . Heart palpitations   . Uncontrolled diabetes mellitus type 2 without complications [facility])     Encounter Details: CNP Questionnaire - 03/22/18 1614      Questionnaire   Patient Status  Not Applicable    Race  Hispanic or Latino    Location Patient [place]  Within past 12 months, food ran out with no money to buy more;Within past 12 months, worried food would run out with no money to buy more    Housing/Utilities  No permanent housing    Transportation  No transportation needs    Interpersonal Safety  Yes, feel physically and emotionally safe where you currently live    Medication  No medication insecurities    Medical Provider  Yes    Referrals  [facility] Agency    ED Visit Averted  Not Applicable    Life-Saving Intervention Made  Not Applicable     Client in today because she is upset regarding services she feels she is not receiving. States all she needs is housing and her appointments have been moved around at the last minute and has expressed her concerns to management. Nurse really just allowed her calming down time and was a sounding  board for her today as she entered the room wanting her blood pressure checked feeling it  was high because of her dissatisfaction and anxiety level. After allowing her to talk she seems much better ,managing her COPD and diabetes well ,no recent hospitalizations . Prefers to check her own blood sugar levels.

## 2018-03-28 NOTE — Congregational Nurse Program (Signed)
  Dept: [facility] Nurse Program Note  Date of Encounter: 03/22/2018  Past Medical History: Past Medical History:  Diagnosis Date  . Adjustment disorder with anxiety   . Asthma   . Benign essential hypertension   . Coronary artery disease   . Diabetic neuropathic arthritis ([place])   . Heart palpitations   . Uncontrolled diabetes mellitus type 2 without complications [facility])     Encounter Details: CNP Questionnaire - 03/28/18 1639      Questionnaire   Patient Status  Not Applicable    Race  Hispanic or Latino    Location Patient Served At  Not Applicable    Insurance  Medicare    Uninsured  Not Applicable    Food  Within past 12 months, food ran out with no money to buy more    Housing/Utilities  No permanent housing    Transportation  No transportation needs    Interpersonal Safety  Yes, feel physically and emotionally safe where you currently live    Medication  No medication insecurities    Medical Provider  Yes    Referrals  Primary Care Provider/Clinic    ED Visit Averted  Not Applicable    Life-Saving Intervention Made  Not Applicable      Nurse was ask by case management to check on client as she was reporting a stomach ache after dinner. Nurse to clients room knocked no answer ,knocked again. Client was asleep on her bed . States she was having some stomach pains maybe because she had red meat for dinner and her system isn't use to it . States she has no other symptoms ,no vomiting ,pain not getting worst ,but still has some pains . Gave instructions on what to do if pains increased and if other symptoms developed . States she understands  what to do .  Follow next week.

## 2018-03-28 NOTE — Congregational Nurse Program (Signed)
  Dept: [facility] Nurse Program Note  Date of Encounter: 03/27/2018  Past Medical History: Past Medical History:  Diagnosis Date  . Adjustment disorder with anxiety   . Asthma   . Benign essential hypertension   . Coronary artery disease   . Diabetic neuropathic arthritis ([place])   . Heart palpitations   . Uncontrolled diabetes mellitus type 2 without complications [facility])     Encounter Details: CNP Questionnaire - 03/28/18 1654      Questionnaire   Referrals  Other    ED Visit Averted  Not Applicable    Life-Saving Intervention Made  Not Applicable      Client in today needing to talk as she appeared upset over several things in her life right now. Due to current restrictions put on all residents at the center the pressure of being restricted to her room due to virus precautions client needed to express  Feelings . Nurse allowed client to express concerns and gave support as she dug deep min blaming herself for decisions  In her past . After allowing her to talk ,I felt she was better able to cope with the virus restrictions . Will continue to support and will make case management aware of her agitation and concerns.

## 2018-04-04 ENCOUNTER — Telehealth: Payer: Self-pay | Admitting: Pulmonary Disease

## 2018-04-04 NOTE — Telephone Encounter (Signed)
With current symptoms and with her being afebrile as well as with clear nasal drainage this is most likely a flare of allergies.  Ensure the patient is on a daily antihistamine.  Is doing nasal saline rinses 2 times daily.    Continue medications as prescribed.  If symptoms last longer than 10 days, or she starts having purulent nasal drainage or fevers develop that she needs to contact our office and we could consider antibiotic therapy at that point in time.  Pt instructions:   Please start taking a daily antihistamine:  >>>choose one of: zyrtec, claritin, allegra, or xyzal  >>>these are over the counter medications  >>>can choose generic option  >>>take daily  >>>this medication helps with allergies, post nasal drip, and cough   Can start taking chlorpheniramine (aka Chlor tabs) 4 mg tablet (1 to 2 tablets at night) for management of allergies and postnasal drip at night >>> This is an over-the-counter medication >>> This medication is sedating   Wyn Quaker FNP

## 2018-04-04 NOTE — Telephone Encounter (Signed)
Primary Pulmonologist: Dr. Valeta Harms Last office visit and with whom: 02/08/2018 with Wyn Quaker NP What do we see them for (pulmonary problems): Cough and DOE Last OV assessment/plan: Continue Symbicort 160 >>> 2 puffs in the morning right when you wake up, rinse out your mouth after use, 12 hours later 2 puffs, rinse after use >>> Take this daily, no matter what >>> This is not a rescue inhaler   Omeprazole 20 mg tablet  >>>Please take 1 tablet daily 15 minutes to 30 minutes before your first meal of the day as well as before your other medications >>>Try to take at the same time each day >>>take this medication daily  GERD management: >>>Avoid laying flat until 2 hours after meals >>>Elevate head of the bed including entire chest >>>Reduce size of meals and amount of fat, acid, spices, caffeine and sweets >>>If you are smoking, Please stop! >>>Decrease alcohol consumption >>>Work on maintaining a healthy weight with normal BMI   Continue follow up with Dr. Terrence Dupont   Follow up with Dr. Valeta Harms in 2-3 months    It is flu season:   >>>Remember to be washing your hands regularly, using hand sanitizer, be careful to use around herself with has contact with people who are sick will increase her chances of getting sick yourself. >>> Best ways to protect herself from the flu: Receive the yearly flu vaccine, practice good hand hygiene washing with soap and also using hand sanitizer when available, eat a nutritious meals, get adequate rest, hydrate appropriately   Please contact the office if your symptoms worsen or you have concerns that you are not improving.   Thank you for choosing [facility] for your healthcare, and for allowing [place] to partner with you on your healthcare journey. I am thankful to be able to provide care to you today.   Wyn Quaker FNP-C   Reason for call: Spoke with patient. She stated that she believes she has a sinus infection. She has noticed an  increased in post nasal drip within the last week. She has a productive cough that becomes worse at night. Phlegm is clear. She also has a runny nose with clear discharge. Denied having a fever, body aches. She tried to use Mucinex but she stated that it caused her to feel nauseous. Also denied any recent travel or being around anyone who has traveled recently. She is still using her Symbicort daily and albuterol as needed.   She wants to know if she needs to have an antibiotic called in for her.   Pharmacy is [facility] on [address].   Aaron Edelman, please advise since BI is not in the office and you were the last one to see her. Thanks!

## 2018-04-04 NOTE — Telephone Encounter (Signed)
Called and spoke with pt stating to her the information per Wyn Quaker. Pt expressed understanding. Nothing further needed.

## 2018-04-12 ENCOUNTER — Ambulatory Visit: Payer: Medicare Other | Admitting: Pulmonary Disease

## 2018-05-04 ENCOUNTER — Ambulatory Visit (HOSPITAL_COMMUNITY)
Admission: EM | Admit: 2018-05-04 | Discharge: 2018-05-04 | Disposition: A | Discharge status: Home / Self Care | Payer: Medicare Other | Attending: Internal Medicine | Admitting: Internal Medicine

## 2018-05-04 ENCOUNTER — Other Ambulatory Visit: Payer: Self-pay

## 2018-05-04 ENCOUNTER — Encounter (HOSPITAL_COMMUNITY): Payer: Self-pay

## 2018-05-04 DIAGNOSIS — H1013 Acute atopic conjunctivitis, bilateral: Secondary | ICD-10-CM | POA: Diagnosis not present

## 2018-05-04 MED ORDER — OLOPATADINE HCL 0.2 % OP SOLN: 1.0000 [drp] | Freq: Every day | OPHTHALMIC | 0 refills | Status: DC

## 2018-05-04 MED ORDER — FLUTICASONE PROPIONATE 50 MCG/ACT NA SUSP: 2.0000 | Freq: Two times a day (BID) | NASAL | 2 refills | Status: DC

## 2018-05-04 NOTE — ED Notes (Signed)
Patient verbalizes understanding of discharge instructions. Opportunity for questioning and answers were provided. Patient discharged from [facility] by provider.

## 2018-05-04 NOTE — ED Triage Notes (Signed)
C/o left eye swelling and irritation

## 2018-05-04 NOTE — ED Provider Notes (Signed)
[place]    CSN: [id number] Arrival date & time: 05/04/18  1437     History   Chief Complaint Chief Complaint  Patient presents with  . Facial Swelling    HPI Sharon Spears is a 73 y.o. female with a history of asthma, diabetes mellitus type 2 comes to urgent care with complaints of itching eyes that started this morning.  Patient complains of nasal congestion, runny nose and itchy nose as well.  She denies any shortness of breath or wheezing.  No cough or sputum production.  No chest pain or chest pressure.Marland Kitchen   HPI  Past Medical History:  Diagnosis Date  . Adjustment disorder with anxiety   . Asthma   . Benign essential hypertension   . Coronary artery disease   . Diabetic neuropathic arthritis ([place])   . Heart palpitations   . Uncontrolled diabetes mellitus type 2 without complications [facility])     Patient Active Problem List   Diagnosis Date Noted  . Cough 02/08/2018  . DOE (dyspnea on exertion) 02/08/2018  . Acute on chronic left systolic heart failure ([place]) 09/25/2017  . Heart palpitations 06/02/2016  . Acute on chronic respiratory failure with hypoxia ([place]) 03/20/2015  . Asthma exacerbation 03/20/2015  . Laryngitis, acute 03/20/2015  . Thrombocytopenia ([place]) 03/20/2015  . Diabetes mellitus type 2, controlled ([place]) 03/20/2015  . HTN (hypertension) 03/20/2015  . S/P CABG (coronary artery bypass graft) 03/20/2015    Past Surgical History:  Procedure Laterality Date  . CARDIAC SURGERY    . CESAREAN SECTION    . CORONARY ARTERY BYPASS GRAFT    . ESOPHAGEAL MANOMETRY N/A 03/06/2012   Procedure: ESOPHAGEAL MANOMETRY (EM);  Surgeon: Beryle Beams, MD;  Location: [facility] END[facility]COPY;  Service: Endoscopy;  Laterality: N/A;  . TONSILLECTOMY      OB History   No obstetric history on file.      Home Medications    Prior to Admission medications   Medication Sig Start Date End Date Taking? Authorizing Provider  acetaminophen (TYLENOL) 500 MG tablet Take  1,000 mg by mouth every 6 (six) hours as needed (pain).     [provider]  albuterol (PROVENTIL HFA;VENTOLIN HFA) 108 (90 BASE) MCG/ACT inhaler Inhale 2 puffs into the lungs every 6 (six) hours as needed for wheezing.     [provider]  albuterol (PROVENTIL) (2.5 MG/3ML) 0.083% nebulizer solution Take 3 mLs (2.5 mg total) by nebulization every 6 (six) hours as needed for wheezing or shortness of breath. 02/23/18   Icard, Octavio Graves, DO  apixaban (ELIQUIS) 5 MG TABS tablet Take 1 tablet (5 mg total) by mouth 2 (two) times daily. 09/28/17   Charolette Forward, MD  budesonide-formoterol (SYMBICORT) 160-4.5 MCG/ACT inhaler Inhale 2 puffs into the lungs every 12 (twelve) hours. 01/26/18   [name], DO  famotidine (PEPCID) 20 MG tablet Take 20 mg by mouth daily before breakfast.  02/04/15   [provider]  ferrous sulfate 325 (65 FE) MG tablet Take 1 tablet (325 mg total) by mouth 3 (three) times daily with meals. 09/28/17   Charolette Forward, MD  fluticasone (FLONASE) 50 MCG/ACT nasal spray Place 2 sprays into both nostrils 2 (two) times daily. Patient taking differently: Place 1 spray into both nostrils daily as needed for allergies or rhinitis.  10/25/15   Jacqualine Mau, NP  furosemide (LASIX) 20 MG tablet Take 2 tablets (40 mg total) by mouth daily. Patient taking differently: Take 20 mg by mouth 2 (  two) times daily.  09/28/17   Charolette Forward, MD  losartan (COZAAR) 25 MG tablet Take 25 mg by mouth 2 (two) times daily.    [provider]  metFORMIN (GLUCOPHAGE XR) 500 MG 24 hr tablet Take 1 tablet (500 mg total) by mouth daily with breakfast. Patient taking differently: Take 500 mg by mouth 2 (two) times daily.  06/02/16   Golden Circle, FNP  metoprolol succinate (TOPROL-XL) 50 MG 24 hr tablet Take 50 mg by mouth daily. Take with or immediately following a meal.    [provider]  Multiple Vitamin (MULTIVITAMIN WITH MINERALS) TABS tablet Take 1  tablet by mouth daily. 09/28/17   Charolette Forward, MD  nitroGLYCERIN (NITROSTAT) 0.4 MG SL tablet Place 0.4 mg under the tongue every 5 (five) minutes as needed for chest pain.     [provider]  omeprazole (PRILOSEC) 20 MG capsule Take 1 capsule (20 mg total) by mouth daily. 02/08/18   Lauraine Rinne, NP  predniSONE (DELTASONE) 10 MG tablet Take 3m for 5 days 01/26/18   IGarner Nash DO    Family History Family History  Problem Relation Age of Onset  . Asthma Mother   . Alcohol abuse Father     Social History Social History   Tobacco Use  . Smoking status: Never Smoker  . Smokeless tobacco: Never Used  Substance Use Topics  . Alcohol use: No  . Drug use: No     Allergies   Adhesive [tape] and Penicillins   Review of Systems Review of Systems  Constitutional: Negative for activity change, appetite change and chills.  HENT: Positive for postnasal drip and rhinorrhea. Negative for ear pain, sinus pressure and sinus pain.   Eyes: Positive for itching. Negative for pain, discharge and redness.  Respiratory: Negative.  Negative for cough, chest tightness, shortness of breath and wheezing.   Genitourinary: Negative.   Musculoskeletal: Negative.   Skin: Negative.   Neurological: Negative.   Psychiatric/Behavioral: Negative.      Physical Exam Triage Vital Signs ED Triage Vitals  Enc Vitals Group     BP 05/04/18 1507 (!) 164/76     Pulse Rate 05/04/18 1507 (!) 52     Resp 05/04/18 1507 20     Temp 05/04/18 1507 98.1 F (36.7 C)     Temp src --      SpO2 05/04/18 1507 98 %     Weight --      Height --      Head Circumference --      Peak Flow --      Pain Score 05/04/18 1508 8     Pain Loc --      Pain Edu? --      [place]cl. in [place] --    No data found.  Updated Vital Signs BP (!) 164/76   Pulse (!) 52   Temp 98.1 F (36.7 C)   Resp 20   SpO2 98%   Visual Acuity Right Eye Distance:   Left Eye Distance: 20/40 Bilateral Distance:    Right Eye  Near:   Left Eye Near:    Bilateral Near:     Physical Exam Constitutional:      General: She is not in acute distress.    Appearance: Normal appearance. She is not ill-appearing.  HENT:     Right Ear: Tympanic membrane normal.     Left Ear: Tympanic membrane normal.     Nose: Nose normal.  Mouth/Throat:     Mouth: Mucous membranes are moist.     Pharynx: No oropharyngeal exudate or posterior oropharyngeal erythema.  Eyes:     General:        Right eye: No discharge.        Left eye: No discharge.     Conjunctiva/sclera: Conjunctivae normal.  Neck:     Musculoskeletal: Normal range of motion.  Cardiovascular:     Rate and Rhythm: Normal rate and regular rhythm.     Pulses: Normal pulses.     Heart sounds: Normal heart sounds.  Pulmonary:     Effort: Pulmonary effort is normal.     Breath sounds: Normal breath sounds.  Abdominal:     General: Bowel sounds are normal.     Palpations: Abdomen is soft.  Neurological:     Mental Status: She is alert.      UC Treatments / Results  Labs (all labs ordered are listed, but only abnormal results are displayed) Labs Reviewed - No data to display  EKG None  Radiology No results found.  Procedures Procedures (including critical care time)  Medications Ordered in UC Medications - No data to display  Initial Impression / Assessment and Plan / UC Course  I have reviewed the triage vital signs and the nursing notes.  Pertinent labs & imaging results that were available during my care of the patient were reviewed by me and considered in my medical decision making (see chart for details).     1.  Allergic conjunctivitis: [place] eyedrops 1 drop each eye daily Continue fluticasone nasal spray Allergen avoidance  Final Clinical Impressions(s) / UC Diagnoses   Final diagnoses:  None   Discharge Instructions   None    ED Prescriptions    None     Controlled Substance Prescriptions [place] Controlled Substance  Registry consulted? No   Chase Picket, MD 05/04/18 [phone]

## 2018-05-08 ENCOUNTER — Encounter (HOSPITAL_COMMUNITY): Payer: Self-pay

## 2018-05-08 ENCOUNTER — Ambulatory Visit (HOSPITAL_COMMUNITY)
Admission: EM | Admit: 2018-05-08 | Discharge: 2018-05-08 | Disposition: A | Discharge status: Home / Self Care | Payer: Medicare Other | Attending: Family Medicine | Admitting: Family Medicine

## 2018-05-08 ENCOUNTER — Other Ambulatory Visit: Payer: Self-pay

## 2018-05-08 DIAGNOSIS — J0110 Acute frontal sinusitis, unspecified: Secondary | ICD-10-CM | POA: Diagnosis not present

## 2018-05-08 MED ORDER — AMOXICILLIN-POT CLAVULANATE 875-125 MG PO TABS: 1.0000 | ORAL_TABLET | Freq: Two times a day (BID) | ORAL | 0 refills | Status: AC

## 2018-05-08 NOTE — ED Provider Notes (Signed)
[place]    CSN: [id number] Arrival date & time: 05/08/18  4650     History   Chief Complaint Chief Complaint  Patient presents with  . Facial Pain    HPI Sharon Spears is a 73 y.o. female.   Sharon Spears presents with complaints of worsening of facial pressure with headache and fevers. Started approximately 4 days ago. Was seen here 4/23 with nasal spray and eye drops provided. Patient states since then has been getting temps up to 100.2, worse at night. Facial pressure, nasal drainage, headache. Cough which is unchanged from her baseline cough associated with her COPD. Chest pain  With cough or deep breathing, which is mild and infrequent. Ear pressure. Nausea and loss of appetite. No vomiting. No new swelling to lower extremities. No weakness. States she has had sinus infections in the past and feels similar. Cardiac hx, s/p CABG, chf, dm, htn, copd. She takes eliquis. No known ill contacts. Resides with her husband and daughter.    ROS per HPI, negative if not otherwise mentioned.      Past Medical History:  Diagnosis Date  . Adjustment disorder with anxiety   . Asthma   . Benign essential hypertension   . Coronary artery disease   . Diabetic neuropathic arthritis ([place])   . Heart palpitations   . Uncontrolled diabetes mellitus type 2 without complications [facility])     Patient Active Problem List   Diagnosis Date Noted  . Cough 02/08/2018  . DOE (dyspnea on exertion) 02/08/2018  . Acute on chronic left systolic heart failure ([place]) 09/25/2017  . Heart palpitations 06/02/2016  . Acute on chronic respiratory failure with hypoxia ([place]) 03/20/2015  . Asthma exacerbation 03/20/2015  . Laryngitis, acute 03/20/2015  . Thrombocytopenia ([place]) 03/20/2015  . Diabetes mellitus type 2, controlled ([place]) 03/20/2015  . HTN (hypertension) 03/20/2015  . S/P CABG (coronary artery bypass graft) 03/20/2015    Past Surgical History:  Procedure Laterality Date  .  CARDIAC SURGERY    . CESAREAN SECTION    . CORONARY ARTERY BYPASS GRAFT    . ESOPHAGEAL MANOMETRY N/A 03/06/2012   Procedure: ESOPHAGEAL MANOMETRY (EM);  Surgeon: Beryle Beams, MD;  Location: [facility];  Service: Endoscopy;  Laterality: N/A;  . TONSILLECTOMY      OB History   No obstetric history on file.      Home Medications    Prior to Admission medications   Medication Sig Start Date End Date Taking? Authorizing Provider  acetaminophen (TYLENOL) 500 MG tablet Take 1,000 mg by mouth every 6 (six) hours as needed (pain).     [provider]  albuterol (PROVENTIL HFA;VENTOLIN HFA) 108 (90 BASE) MCG/ACT inhaler Inhale 2 puffs into the lungs every 6 (six) hours as needed for wheezing.     [provider]  albuterol (PROVENTIL) (2.5 MG/3ML) 0.083% nebulizer solution Take 3 mLs (2.5 mg total) by nebulization every 6 (six) hours as needed for wheezing or shortness of breath. 02/23/18   Icard, Leory Plowman L, DO  amoxicillin-clavulanate (AUGMENTIN) 875-125 MG tablet Take 1 tablet by mouth every 12 (twelve) hours for 10 days. 05/08/18 05/18/18  Zigmund Gottron, NP  apixaban (ELIQUIS) 5 MG TABS tablet Take 1 tablet (5 mg total) by mouth 2 (two) times daily. 09/28/17   Charolette Forward, MD  budesonide-formoterol (SYMBICORT) 160-4.5 MCG/ACT inhaler Inhale 2 puffs into the lungs every 12 (twelve) hours. 01/26/18   Icard, Octavio Graves, DO  famotidine (PEPCID) 20 MG tablet Take 20  mg by mouth daily before breakfast.  02/04/15   [provider]  ferrous sulfate 325 (65 FE) MG tablet Take 1 tablet (325 mg total) by mouth 3 (three) times daily with meals. 09/28/17   Charolette Forward, MD  fluticasone (FLONASE) 50 MCG/ACT nasal spray Place 2 sprays into both nostrils 2 (two) times daily. 05/04/18   Lamptey, Myrene Galas, MD  furosemide (LASIX) 20 MG tablet Take 2 tablets (40 mg total) by mouth daily. Patient taking differently: Take 20 mg by mouth 2 (two) times daily.  09/28/17   Charolette Forward, MD   losartan (COZAAR) 25 MG tablet Take 25 mg by mouth 2 (two) times daily.    [provider]  metFORMIN (GLUCOPHAGE XR) 500 MG 24 hr tablet Take 1 tablet (500 mg total) by mouth daily with breakfast. Patient taking differently: Take 500 mg by mouth 2 (two) times daily.  06/02/16   Golden Circle, FNP  metoprolol succinate (TOPROL-XL) 50 MG 24 hr tablet Take 50 mg by mouth daily. Take with or immediately following a meal.    [provider]  Multiple Vitamin (MULTIVITAMIN WITH MINERALS) TABS tablet Take 1 tablet by mouth daily. 09/28/17   Charolette Forward, MD  nitroGLYCERIN (NITROSTAT) 0.4 MG SL tablet Place 0.4 mg under the tongue every 5 (five) minutes as needed for chest pain.     [provider]  Olopatadine HCl 0.2 % SOLN Apply 1 drop to eye daily. 05/04/18   Chase Picket, MD  omeprazole (PRILOSEC) 20 MG capsule Take 1 capsule (20 mg total) by mouth daily. 02/08/18   Lauraine Rinne, NP  predniSONE (DELTASONE) 10 MG tablet Take 44m for 5 days 01/26/18   IGarner Nash DO    Family History Family History  Problem Relation Age of Onset  . Asthma Mother   . Alcohol abuse Father     Social History Social History   Tobacco Use  . Smoking status: Never Smoker  . Smokeless tobacco: Never Used  Substance Use Topics  . Alcohol use: No  . Drug use: No     Allergies   Adhesive [tape] and Penicillins   Review of Systems Review of Systems   Physical Exam Triage Vital Signs ED Triage Vitals  Enc Vitals Group     BP 05/08/18 1007 140/70     Pulse Rate 05/08/18 1007 77     Resp 05/08/18 1007 18     Temp 05/08/18 1007 99.2 F (37.3 C)     Temp Source 05/08/18 1007 Oral     SpO2 05/08/18 1007 98 %     Weight 05/08/18 1010 140 lb (63.5 kg)     Height --      Head Circumference --      Peak Flow --      Pain Score 05/08/18 1010 9     Pain Loc --      Pain Edu? --      Excl. in [place] --    No data found.  Updated Vital Signs BP 140/70 (BP  Location: Right Arm)   Pulse 77   Temp 99.2 F (37.3 C) (Oral)   Resp 18   Wt 140 lb (63.5 kg)   SpO2 98%   BMI 27.34 kg/m    Physical Exam Constitutional:      General: She is not in acute distress.    Appearance: She is well-developed.  HENT:     Head: Normocephalic and atraumatic.  Comments: Voice hoarseness noted; mild rhinorrhea     Right Ear: Tympanic membrane, ear canal and external ear normal.     Left Ear: Tympanic membrane, ear canal and external ear normal.     Nose: Nose normal.     Mouth/Throat:     Pharynx: Uvula midline.     Tonsils: No tonsillar exudate.  Eyes:     Conjunctiva/sclera: Conjunctivae normal.     Pupils: Pupils are equal, round, and reactive to light.  Cardiovascular:     Rate and Rhythm: Normal rate. Rhythm irregular.     Heart sounds: Normal heart sounds.  Pulmonary:     Effort: Pulmonary effort is normal.     Comments: Faint crackles to left base noted on deep inspiration  Musculoskeletal:     Right lower leg: No edema.     Left lower leg: No edema.  Skin:    General: Skin is warm and dry.  Neurological:     Mental Status: She is alert and oriented to person, place, and time.      UC Treatments / Results  Labs (all labs ordered are listed, but only abnormal results are displayed) Labs Reviewed - No data to display  EKG None  Radiology No results found.  Procedures Procedures (including critical care time)  Medications Ordered in UC Medications - No data to display  Initial Impression / Assessment and Plan / UC Course  I have reviewed the triage vital signs and the nursing notes.  Pertinent labs & imaging results that were available during my care of the patient were reviewed by me and considered in my medical decision making (see chart for details).     Low grade temps which are new since onset of symptoms. No known ill contacts. Significant cardiac history, crackles to lung bases noted. No new hypoxia or increased  work of breathing. No change to cough. Will cover sinusitis with augmentin at this time, patient quite insistent she needs antibiotics. Return precautions provided. Patient verbalized understanding and agreeable to plan.    Final Clinical Impressions(s) / UC Diagnoses   Final diagnoses:  Acute frontal sinusitis, recurrence not specified     Discharge Instructions     Push fluids to ensure adequate hydration and keep secretions thin.  Tylenol as needed for pain or fever.  Continue with prescribed home medications.  Complete course of antibiotics.  If develop worsening of fever, dehydration, cough, shortness of breath, weakness, or otherwise worsening please go to the ER.    ED Prescriptions    Medication Sig Dispense Auth. Provider   amoxicillin-clavulanate (AUGMENTIN) 875-125 MG tablet Take 1 tablet by mouth every 12 (twelve) hours for 10 days. 20 tablet Zigmund Gottron, NP     Controlled Substance Prescriptions [place] Controlled Substance Registry consulted? Not Applicable   Zigmund Gottron, NP 05/08/18 1059

## 2018-05-08 NOTE — ED Triage Notes (Signed)
Pt cc states she has a sinus infection. Pt states she has been having a fever for 2 days.

## 2018-05-08 NOTE — Discharge Instructions (Signed)
Push fluids to ensure adequate hydration and keep secretions thin.  Tylenol as needed for pain or fever.  Continue with prescribed home medications.  Complete course of antibiotics.  If develop worsening of fever, dehydration, cough, shortness of breath, weakness, or otherwise worsening please go to the ER.

## 2018-06-07 ENCOUNTER — Other Ambulatory Visit: Payer: Self-pay

## 2018-06-07 MED ORDER — ALBUTEROL SULFATE HFA 108 (90 BASE) MCG/ACT IN AERS: 2.0000 | INHALATION_SPRAY | Freq: Four times a day (QID) | RESPIRATORY_TRACT | 1 refills | Status: DC | PRN

## 2018-06-12 ENCOUNTER — Ambulatory Visit (INDEPENDENT_AMBULATORY_CARE_PROVIDER_SITE_OTHER): Payer: Medicare Other | Admitting: Nurse Practitioner

## 2018-06-12 ENCOUNTER — Other Ambulatory Visit: Payer: Self-pay

## 2018-06-12 ENCOUNTER — Encounter: Payer: Self-pay | Admitting: Nurse Practitioner

## 2018-06-12 ENCOUNTER — Ambulatory Visit (INDEPENDENT_AMBULATORY_CARE_PROVIDER_SITE_OTHER): Payer: Medicare Other

## 2018-06-12 VITALS — BP 138/66 | HR 67 | Temp 98.3°F | Ht 63.0 in | Wt 144.0 lb

## 2018-06-12 DIAGNOSIS — R0602 Shortness of breath: Secondary | ICD-10-CM | POA: Diagnosis not present

## 2018-06-12 DIAGNOSIS — I5023 Acute on chronic systolic (congestive) heart failure: Secondary | ICD-10-CM | POA: Diagnosis not present

## 2018-06-12 DIAGNOSIS — J4521 Mild intermittent asthma with (acute) exacerbation: Secondary | ICD-10-CM | POA: Diagnosis not present

## 2018-06-12 DIAGNOSIS — J45909 Unspecified asthma, uncomplicated: Secondary | ICD-10-CM | POA: Insufficient documentation

## 2018-06-12 LAB — CBC WITH DIFFERENTIAL/PLATELET
Basophils Absolute: 0 10*3/uL (ref 0.0–0.1)
Basophils Relative: 0.5 % (ref 0.0–3.0)
Eosinophils Absolute: 0 10*3/uL (ref 0.0–0.7)
Eosinophils Relative: 0.6 % (ref 0.0–5.0)
HCT: 35.1 % — ABNORMAL LOW (ref 36.0–46.0)
Hemoglobin: 11.5 g/dL — ABNORMAL LOW (ref 12.0–15.0)
Lymphocytes Relative: 22 % (ref 12.0–46.0)
Lymphs Abs: 1.2 10*3/uL (ref 0.7–4.0)
MCHC: 32.6 g/dL (ref 30.0–36.0)
MCV: 77.3 fl — ABNORMAL LOW (ref 78.0–100.0)
Monocytes Absolute: 0.5 10*3/uL (ref 0.1–1.0)
Monocytes Relative: 9.4 % (ref 3.0–12.0)
Neutro Abs: 3.8 10*3/uL (ref 1.4–7.7)
Neutrophils Relative %: 67.5 % (ref 43.0–77.0)
Platelets: 177 10*3/uL (ref 150.0–400.0)
RBC: 4.54 Mil/uL (ref 3.87–5.11)
RDW: 18.4 % — ABNORMAL HIGH (ref 11.5–15.5)
WBC: 5.7 10*3/uL (ref 4.0–10.5)

## 2018-06-12 MED ORDER — ALBUTEROL SULFATE HFA 108 (90 BASE) MCG/ACT IN AERS: 2.0000 | INHALATION_SPRAY | Freq: Four times a day (QID) | RESPIRATORY_TRACT | 1 refills | Status: DC | PRN

## 2018-06-12 MED ORDER — PREDNISONE 10 MG PO TABS: 20.0000 mg | ORAL_TABLET | Freq: Every day | ORAL | 0 refills | Status: AC

## 2018-06-12 MED ORDER — ALBUTEROL SULFATE (2.5 MG/3ML) 0.083% IN NEBU: 2.5000 mg | INHALATION_SOLUTION | Freq: Four times a day (QID) | RESPIRATORY_TRACT | 12 refills | Status: DC | PRN

## 2018-06-12 MED ORDER — AZITHROMYCIN 250 MG PO TABS: ORAL_TABLET | ORAL | 0 refills | Status: DC

## 2018-06-12 MED ORDER — MONTELUKAST SODIUM 10 MG PO TABS: 10.0000 mg | ORAL_TABLET | Freq: Every day | ORAL | 11 refills | Status: DC

## 2018-06-12 NOTE — Assessment & Plan Note (Signed)
DOE (dyspnea on exertion) Assessment: Patient is having chest congestion today with productive cough - will treat with azithromycin and prednisone Pulmonary function testing in January did not indicate COPD, slight mid flow reversibility which could indicate asthma Patient could not tolerate Symbicort and refuses to try a different inhaler at this time She wants to continue albuterol as needed - needs refills has been out of medications  Plan: Patient Instructions  Continue albuterol inhaler or nebulizer as needed every 4 - 6 hours as needed for wheezing or shortness of breath Will order chest x ray and call with results Will order azithromycin Will order prednisone Will add Singulair daily Will order lab work - CBC w/diff, IGE  Follow up: Follow up with Dr. Valeta Harms in 2 months or sooner if needed

## 2018-06-12 NOTE — Assessment & Plan Note (Signed)
Acute on chronic left systolic heart failure (HCC) Plan: Continue regular follow-up with Dr. Terrence Dupont

## 2018-06-12 NOTE — Progress Notes (Signed)
_0  ID: Sharon Spears, female    DOB: 1945/06/30, 73 y.o.   MRN: [id number]  Chief Complaint  Patient presents with  . Other    Clear chest congestion. feels like she is choking at times.    Referring provider: Charolette Forward, MD  HPI 73 year old female never smoker but with prolonged smoke exposure living in [place] for over 50 years referred to our office on 01/26/2018 for prolonged dyspnea  PMH: Congestive heart failure Smoker/ Smoking History: Never smoker Maintenance: Symbicort 160 Pt of: Dr. Valeta Harms  Tests: 02/08/2018-pulmonary function test - FVC 1.46 (58% predicted), postbronchodilator ratio 82, postbronchodilator FEV1 1.19 (63% predicted), no significant bronchodilator response, slight mid flow reversibility after administration of bronchodilators, DLCO is 67 which corrects to 79 based off of hemoglobin.  Echocardiogram:  Study Conclusions  - Left ventricle: The cavity size was normal. Systolic function was mildly reduced. The estimated ejection fraction was in the range of 45% to 50%. Wall motion was normal; there were no regional wall motion abnormalities. - Aortic valve: Valve area (VTI): 1.39 cm^2. Valve area (Vmax): 1.59 cm^2. Valve area (Vmean): 1.52 cm^2. - Mitral valve: Calcified annulus. Moderately calcified leaflets . The findings are consistent with moderate stenosis. Valve area by continuity equation (using LVOT flow): 0.84 cm^2. - Left atrium: The atrium was mildly dilated. - Right atrium: The atrium was mildly dilated. - Atrial septum: No defect or patent foramen ovale was identified. - Tricuspid valve: There was mild-moderate regurgitation. - Pulmonary arteries: Systolic pressure was moderately increased. PA peak pressure: 54 mm Hg (S). - Pericardium, extracardiac: There was a left pleural effusion  PFT Results Latest Ref Rng & Units 02/08/2018  FVC-Pre L 1.46  FVC-Predicted Pre % 58  FVC-Post L 1.46  FVC-Predicted Post  % 58  Pre FEV1/FVC % % 80  Post FEV1/FCV % % 82  FEV1-Pre L 1.17  FEV1-Predicted Pre % 62  FEV1-Post L 1.19  DLCO UNC% % 67  DLCO COR %Predicted % 112  TLC L 3.41  TLC % Predicted % 76  RV % Predicted % 92     OV 06/12/18 - follow up Patient is a tele-visit today for a follow-up visit.  She was last seen by Wyn Quaker on 02/08/2018.  She states that overall she has been doing well, but over the last couple weeks she has had chest congestion with cough that is productive of yellow sputum.  Denies any recent fever.  She states that she has not been taking Symbicort due to intolerance.  She thought that it caused her to have thrush.  She does not take Prilosec any longer but does take Pepcid.  She states that she does use Ventolin as needed.  She states that this usually works well for her.  She states that she does not want to try any other inhalers at this time. Denies f/c/s, n/v/d, hemoptysis, PND, leg swelling.     Allergies  Allergen Reactions  . Adhesive [Tape] Itching  . Penicillins Other (See Comments)    GI upset Has patient had a PCN reaction causing immediate rash, facial/tongue/throat swelling, SOB or lightheadedness with hypotension: No Has patient had a PCN reaction causing severe rash involving mucus membranes or skin necrosis: No Has patient had a PCN reaction that required hospitalization: No Has patient had a PCN reaction occurring within the last 10 years: No If all of the above answers are "NO", then may proceed with Cephalosporin use.    Immunization History  Administered  Date(s) Administered  . Influenza,inj,Quad PF,6+ Mos 11/21/2015    Past Medical History:  Diagnosis Date  . Adjustment disorder with anxiety   . Asthma   . Benign essential hypertension   . Coronary artery disease   . Diabetic neuropathic arthritis ([place])   . Heart palpitations   . Uncontrolled diabetes mellitus type 2 without complications ([place])     Tobacco History: Social History    Tobacco Use  Smoking Status Never Smoker  Smokeless Tobacco Never Used   Counseling given: Yes   Outpatient Encounter Medications as of 06/12/2018  Medication Sig  . acetaminophen (TYLENOL) 500 MG tablet Take 1,000 mg by mouth every 6 (six) hours as needed (pain).   Marland Kitchen albuterol (PROVENTIL) (2.5 MG/3ML) 0.083% nebulizer solution Take 3 mLs (2.5 mg total) by nebulization every 6 (six) hours as needed for wheezing or shortness of breath.  Marland Kitchen albuterol (VENTOLIN HFA) 108 (90 Base) MCG/ACT inhaler Inhale 2 puffs into the lungs every 6 (six) hours as needed for wheezing.  Marland Kitchen apixaban (ELIQUIS) 5 MG TABS tablet Take 1 tablet (5 mg total) by mouth 2 (two) times daily.  . famotidine (PEPCID) 20 MG tablet Take 20 mg by mouth daily before breakfast.   . ferrous sulfate 325 (65 FE) MG tablet Take 1 tablet (325 mg total) by mouth 3 (three) times daily with meals.  . fluticasone (FLONASE) 50 MCG/ACT nasal spray Place 2 sprays into both nostrils 2 (two) times daily.  . furosemide (LASIX) 20 MG tablet Take 2 tablets (40 mg total) by mouth daily. (Patient taking differently: Take 20 mg by mouth 2 (two) times daily. )  . losartan (COZAAR) 25 MG tablet Take 25 mg by mouth 2 (two) times daily.  . metFORMIN (GLUCOPHAGE XR) 500 MG 24 hr tablet Take 1 tablet (500 mg total) by mouth daily with breakfast. (Patient taking differently: Take 500 mg by mouth 2 (two) times daily. )  . metoprolol succinate (TOPROL-XL) 50 MG 24 hr tablet Take 50 mg by mouth daily. Take with or immediately following a meal.  . Multiple Vitamin (MULTIVITAMIN WITH MINERALS) TABS tablet Take 1 tablet by mouth daily.  . nitroGLYCERIN (NITROSTAT) 0.4 MG SL tablet Place 0.4 mg under the tongue every 5 (five) minutes as needed for chest pain.   . predniSONE (DELTASONE) 10 MG tablet Take 45m for 5 days  . [DISCONTINUED] albuterol (PROVENTIL) (2.5 MG/3ML) 0.083% nebulizer solution Take 3 mLs (2.5 mg total) by nebulization every 6 (six) hours as needed  for wheezing or shortness of breath.  . [DISCONTINUED] albuterol (VENTOLIN HFA) 108 (90 Base) MCG/ACT inhaler Inhale 2 puffs into the lungs every 6 (six) hours as needed for wheezing.  . [DISCONTINUED] budesonide-formoterol (SYMBICORT) 160-4.5 MCG/ACT inhaler Inhale 2 puffs into the lungs every 12 (twelve) hours.  .Marland Kitchenazithromycin (ZITHROMAX) 250 MG tablet Take 2 tablets (500 mg) on day 1, then take 1 tablet (250 mg) on days 2-5  . montelukast (SINGULAIR) 10 MG tablet Take 1 tablet (10 mg total) by mouth at bedtime.  . Olopatadine HCl 0.2 % SOLN Apply 1 drop to eye daily. (Patient not taking: Reported on 06/12/2018)  . omeprazole (PRILOSEC) 20 MG capsule Take 1 capsule (20 mg total) by mouth daily. (Patient not taking: Reported on 06/12/2018)  . predniSONE (DELTASONE) 10 MG tablet Take 2 tablets (20 mg total) by mouth daily with breakfast for 5 days.   No facility-administered encounter medications on file as of 06/12/2018.      Review of Systems  Review of Systems  Constitutional: Negative.  Negative for chills and fever.  HENT: Negative.   Respiratory: Positive for cough and shortness of breath. Negative for wheezing.   Cardiovascular: Negative.  Negative for chest pain, palpitations and leg swelling.  Gastrointestinal: Negative.   Allergic/Immunologic: Negative.   Neurological: Negative.   Psychiatric/Behavioral: Negative.        Physical Exam  BP 138/66 (BP Location: Left Arm, Patient Position: Sitting, Cuff Size: Normal)   Pulse 67   Temp 98.3 F (36.8 C)   Ht _0  (1.6 m)   Wt 144 lb (65.3 kg)   SpO2 96%   BMI 25.51 kg/m   Wt Readings from Last 5 Encounters:  06/12/18 144 lb (65.3 kg)  05/08/18 140 lb (63.5 kg)  02/21/18 140 lb (63.5 kg)  02/08/18 137 lb (62.1 kg)  02/07/18 140 lb (63.5 kg)     Physical Exam Vitals signs and nursing note reviewed.  Constitutional:      General: She is not in acute distress.    Appearance: She is well-developed.  Cardiovascular:      Rate and Rhythm: Normal rate and regular rhythm.  Pulmonary:     Effort: Pulmonary effort is normal. No respiratory distress.     Breath sounds: Normal breath sounds. No wheezing or rhonchi.  Musculoskeletal:        General: No swelling.  Neurological:     Mental Status: She is alert and oriented to person, place, and time.     Imaging: Dg Chest 2 View  Result Date: 06/12/2018 CLINICAL DATA:  Shortness of breath. History of asthma and diabetes. EXAM: CHEST - 2 VIEW COMPARISON:  Radiographs 01/21/2018 and 01/15/2018. FINDINGS: Stable cardiomegaly post median sternotomy and CABG. There is stable chronic blunting at the left costophrenic angle and chronic bibasilar scarring. No edema, confluent airspace opacity, pleural effusion or pneumothorax. The bones appear unchanged. IMPRESSION: Stable postoperative chest.  No acute cardiopulmonary process. Electronically Signed   By: Richardean Sale M.D.   On: 06/12/2018 11:36     Assessment & Plan:   Mild intermittent asthma with exacerbation DOE (dyspnea on exertion) Assessment: Patient is having chest congestion today with productive cough - will treat with azithromycin and prednisone Pulmonary function testing in January did not indicate COPD, slight mid flow reversibility which could indicate asthma Patient could not tolerate Symbicort and refuses to try a different inhaler at this time She wants to continue albuterol as needed - needs refills has been out of medications  Plan: Patient Instructions  Continue albuterol inhaler or nebulizer as needed every 4 - 6 hours as needed for wheezing or shortness of breath Will order chest x ray and call with results Will order azithromycin Will order prednisone Will add Singulair daily Will order lab work - CBC w/diff, IGE  Follow up: Follow up with Dr. Valeta Harms in 2 months or sooner if needed              Acute on chronic left systolic heart failure ([place]) Acute on chronic left  systolic heart failure ([place]) Plan: Continue regular follow-up with Dr. Baldo Daub, NP 06/12/2018

## 2018-06-12 NOTE — Patient Instructions (Addendum)
Continue albuterol inhaler or nebulizer as needed every 4 - 6 hours as needed for wheezing or shortness of breath Will order chest x ray and call with results Will order azithromycin Will order prednisone Will add Singulair daily Will order lab work - CBC w/diff, IGE  Follow up: Follow up with Dr. Valeta Harms in 2 months or sooner if needed

## 2018-06-13 LAB — IGE: IgE (Immunoglobulin E), Serum: 49 kU/L (ref <=114)

## 2018-06-13 NOTE — Progress Notes (Signed)
PCCM: thanks for seeing  [name], DO [facility] 06/13/2018 3:29 PM

## 2018-07-10 ENCOUNTER — Telehealth: Payer: Self-pay | Admitting: Nurse Practitioner

## 2018-07-10 ENCOUNTER — Other Ambulatory Visit: Payer: Self-pay | Admitting: Nurse Practitioner

## 2018-07-10 MED ORDER — AZITHROMYCIN 250 MG PO TABS: ORAL_TABLET | ORAL | 0 refills | Status: DC

## 2018-07-10 NOTE — Telephone Encounter (Signed)
Spoke with pt and sent in script to preferred pharmacy.  Nothing further is needed.

## 2018-07-10 NOTE — Telephone Encounter (Signed)
Primary Pulmonologist: [facility] Last office visit and with whom: 06/12/2018 with [facility] What do we see them for (pulmonary problems): asthma Last OV assessment/plan: Instructions    Return in about 8 weeks (around 08/07/2018) for follow up. Continue albuterol inhaler or nebulizer as needed every 4 - 6 hours as needed for wheezing or shortness of breath Will order chest x ray and call with results Will order azithromycin Will order prednisone Will add Singulair daily Will order lab work - CBC w/diff, IGE  Follow up: Follow up with Dr. Valeta Harms in 2 months or sooner if needed     Was appointment offered to patient (explain)?  Pt requesting refill of azithromycin   Reason for call: called and spoke with pt to see why she needed a refill of azithromycin and pt stated yesterday, she began to have some wheezing, is coughing but is unable to get phlegm up. Pt also has chest tightness.  Pt denies any fever as temp this morning was 97.8.  Pt did use her rescue inhaler and nebulizer last night 6/28 but stated neither helped with  Her symptoms. Pt is using singulair daily as prescribed.  Due to pt's symptoms being the same as it was on 6/1, she is requesting a refill of azithromycin. Tonya, please advise on this for pt. Thanks!

## 2018-07-10 NOTE — Telephone Encounter (Signed)
OK to refill. Please make sure she is following other instructions from last visit. Continue albuterol inhaler or nebulizer as needed every 4 - 6 hours as needed for wheezing or shortness of breath. Singulair daily.

## 2018-07-19 ENCOUNTER — Emergency Department (HOSPITAL_COMMUNITY): Payer: Medicare Other

## 2018-07-19 ENCOUNTER — Encounter (HOSPITAL_COMMUNITY): Payer: Self-pay | Admitting: Emergency Medicine

## 2018-07-19 ENCOUNTER — Inpatient Hospital Stay (HOSPITAL_COMMUNITY)
Admission: EM | Admit: 2018-07-19 | Discharge: 2018-07-21 | DRG: 291 | Disposition: A | Discharge status: Home / Self Care | Payer: Medicare Other | Attending: Family Medicine | Admitting: Family Medicine

## 2018-07-19 ENCOUNTER — Other Ambulatory Visit: Payer: Self-pay

## 2018-07-19 DIAGNOSIS — J449 Chronic obstructive pulmonary disease, unspecified: Secondary | ICD-10-CM | POA: Diagnosis present

## 2018-07-19 DIAGNOSIS — T502X5A Adverse effect of carbonic-anhydrase inhibitors, benzothiadiazides and other diuretics, initial encounter: Secondary | ICD-10-CM | POA: Diagnosis present

## 2018-07-19 DIAGNOSIS — Z7901 Long term (current) use of anticoagulants: Secondary | ICD-10-CM | POA: Diagnosis not present

## 2018-07-19 DIAGNOSIS — Z888 Allergy status to other drugs, medicaments and biological substances status: Secondary | ICD-10-CM | POA: Diagnosis not present

## 2018-07-19 DIAGNOSIS — Z87892 Personal history of anaphylaxis: Secondary | ICD-10-CM | POA: Diagnosis not present

## 2018-07-19 DIAGNOSIS — R350 Frequency of micturition: Secondary | ICD-10-CM | POA: Diagnosis present

## 2018-07-19 DIAGNOSIS — I5023 Acute on chronic systolic (congestive) heart failure: Secondary | ICD-10-CM | POA: Diagnosis present

## 2018-07-19 DIAGNOSIS — J90 Pleural effusion, not elsewhere classified: Secondary | ICD-10-CM | POA: Diagnosis not present

## 2018-07-19 DIAGNOSIS — Z20828 Contact with and (suspected) exposure to other viral communicable diseases: Secondary | ICD-10-CM | POA: Diagnosis present

## 2018-07-19 DIAGNOSIS — I110 Hypertensive heart disease with heart failure: Principal | ICD-10-CM | POA: Diagnosis present

## 2018-07-19 DIAGNOSIS — Z7984 Long term (current) use of oral hypoglycemic drugs: Secondary | ICD-10-CM | POA: Diagnosis not present

## 2018-07-19 DIAGNOSIS — I4891 Unspecified atrial fibrillation: Secondary | ICD-10-CM | POA: Diagnosis not present

## 2018-07-19 DIAGNOSIS — D509 Iron deficiency anemia, unspecified: Secondary | ICD-10-CM | POA: Diagnosis present

## 2018-07-19 DIAGNOSIS — I248 Other forms of acute ischemic heart disease: Secondary | ICD-10-CM | POA: Diagnosis present

## 2018-07-19 DIAGNOSIS — Z880 Allergy status to penicillin: Secondary | ICD-10-CM

## 2018-07-19 DIAGNOSIS — I050 Rheumatic mitral stenosis: Secondary | ICD-10-CM | POA: Diagnosis present

## 2018-07-19 DIAGNOSIS — I2510 Atherosclerotic heart disease of native coronary artery without angina pectoris: Secondary | ICD-10-CM | POA: Diagnosis present

## 2018-07-19 DIAGNOSIS — R0902 Hypoxemia: Secondary | ICD-10-CM

## 2018-07-19 DIAGNOSIS — E119 Type 2 diabetes mellitus without complications: Secondary | ICD-10-CM | POA: Diagnosis present

## 2018-07-19 DIAGNOSIS — E1169 Type 2 diabetes mellitus with other specified complication: Secondary | ICD-10-CM | POA: Diagnosis not present

## 2018-07-19 DIAGNOSIS — I5022 Chronic systolic (congestive) heart failure: Secondary | ICD-10-CM | POA: Diagnosis present

## 2018-07-19 DIAGNOSIS — J9601 Acute respiratory failure with hypoxia: Secondary | ICD-10-CM | POA: Diagnosis present

## 2018-07-19 DIAGNOSIS — Z79899 Other long term (current) drug therapy: Secondary | ICD-10-CM | POA: Diagnosis not present

## 2018-07-19 DIAGNOSIS — I272 Pulmonary hypertension, unspecified: Secondary | ICD-10-CM | POA: Diagnosis present

## 2018-07-19 DIAGNOSIS — K7469 Other cirrhosis of liver: Secondary | ICD-10-CM

## 2018-07-19 DIAGNOSIS — D638 Anemia in other chronic diseases classified elsewhere: Secondary | ICD-10-CM | POA: Diagnosis present

## 2018-07-19 DIAGNOSIS — Z91041 Radiographic dye allergy status: Secondary | ICD-10-CM

## 2018-07-19 DIAGNOSIS — R7989 Other specified abnormal findings of blood chemistry: Secondary | ICD-10-CM | POA: Diagnosis not present

## 2018-07-19 DIAGNOSIS — Z951 Presence of aortocoronary bypass graft: Secondary | ICD-10-CM

## 2018-07-19 DIAGNOSIS — I4821 Permanent atrial fibrillation: Secondary | ICD-10-CM | POA: Diagnosis present

## 2018-07-19 DIAGNOSIS — Z7952 Long term (current) use of systemic steroids: Secondary | ICD-10-CM

## 2018-07-19 DIAGNOSIS — Z825 Family history of asthma and other chronic lower respiratory diseases: Secondary | ICD-10-CM

## 2018-07-19 DIAGNOSIS — R079 Chest pain, unspecified: Secondary | ICD-10-CM | POA: Diagnosis present

## 2018-07-19 DIAGNOSIS — R1011 Right upper quadrant pain: Secondary | ICD-10-CM

## 2018-07-19 DIAGNOSIS — Z811 Family history of alcohol abuse and dependence: Secondary | ICD-10-CM

## 2018-07-19 DIAGNOSIS — Z7951 Long term (current) use of inhaled steroids: Secondary | ICD-10-CM

## 2018-07-19 HISTORY — DX: Unspecified atrial fibrillation: I48.91

## 2018-07-19 HISTORY — DX: Hypoxemia: R09.02

## 2018-07-19 LAB — BRAIN NATRIURETIC PEPTIDE: B Natriuretic Peptide: 580.5 pg/mL — ABNORMAL HIGH (ref 0.0–100.0)

## 2018-07-19 LAB — BASIC METABOLIC PANEL
Anion gap: 11 (ref 5–15)
BUN: 26 mg/dL — ABNORMAL HIGH (ref 8–23)
CO2: 22 mmol/L (ref 22–32)
Calcium: 9.1 mg/dL (ref 8.9–10.3)
Chloride: 106 mmol/L (ref 98–111)
Creatinine, Ser: 0.87 mg/dL (ref 0.44–1.00)
GFR calc Af Amer: >60 mL/min (ref >60)
GFR calc non Af Amer: >60 mL/min (ref >60)
Glucose, Bld: 168 mg/dL — ABNORMAL HIGH (ref 70–99)
Potassium: 3.6 mmol/L (ref 3.5–5.1)
Sodium: 139 mmol/L (ref 135–145)

## 2018-07-19 LAB — HEPATIC FUNCTION PANEL
ALT: 26 U/L (ref 0–44)
AST: 30 U/L (ref 15–41)
Albumin: 3.4 g/dL — ABNORMAL LOW (ref 3.5–5.0)
Alkaline Phosphatase: 62 U/L (ref 38–126)
Bilirubin, Direct: 0.2 mg/dL (ref 0.0–0.2)
Indirect Bilirubin: 0.8 mg/dL (ref 0.3–0.9)
Total Bilirubin: 1 mg/dL (ref 0.3–1.2)
Total Protein: 6.8 g/dL (ref 6.5–8.1)

## 2018-07-19 LAB — CBC
HCT: 33.3 % — ABNORMAL LOW (ref 36.0–46.0)
Hemoglobin: 10.3 g/dL — ABNORMAL LOW (ref 12.0–15.0)
MCH: 24.3 pg — ABNORMAL LOW (ref 26.0–34.0)
MCHC: 30.9 g/dL (ref 30.0–36.0)
MCV: 78.7 fL — ABNORMAL LOW (ref 80.0–100.0)
Platelets: 176 10*3/uL (ref 150–400)
RBC: 4.23 MIL/uL (ref 3.87–5.11)
RDW: 16 % — ABNORMAL HIGH (ref 11.5–15.5)
WBC: 5.6 10*3/uL (ref 4.0–10.5)
nRBC: 0 % (ref 0.0–0.2)

## 2018-07-19 LAB — GLUCOSE, CAPILLARY: Glucose-Capillary: 105 mg/dL — ABNORMAL HIGH (ref 70–99)

## 2018-07-19 LAB — SARS CORONAVIRUS 2 BY RT PCR (HOSPITAL ORDER, PERFORMED IN ~~LOC~~ HOSPITAL LAB): SARS Coronavirus 2: NEGATIVE

## 2018-07-19 LAB — TROPONIN I (HIGH SENSITIVITY)
Troponin I (High Sensitivity): 21 ng/L — ABNORMAL HIGH (ref <18)
Troponin I (High Sensitivity): 23 ng/L — ABNORMAL HIGH (ref <18)

## 2018-07-19 LAB — D-DIMER, QUANTITATIVE: D-Dimer, Quant: 1.47 ug/mL-FEU — ABNORMAL HIGH (ref 0.00–0.50)

## 2018-07-19 MED ORDER — TRAZODONE HCL 50 MG PO TABS: 25.0000 mg | ORAL_TABLET | Freq: Every evening | ORAL | Status: DC | PRN

## 2018-07-19 MED ORDER — ONDANSETRON HCL 4 MG/2ML IJ SOLN: 4.0000 mg | Freq: Four times a day (QID) | INTRAMUSCULAR | Status: DC | PRN

## 2018-07-19 MED ORDER — ONDANSETRON HCL 4 MG PO TABS: 4.0000 mg | ORAL_TABLET | Freq: Four times a day (QID) | ORAL | Status: DC | PRN

## 2018-07-19 MED ORDER — INSULIN ASPART 100 UNIT/ML ~~LOC~~ SOLN: 0.0000 [IU] | Freq: Every day | SUBCUTANEOUS | Status: DC

## 2018-07-19 MED ORDER — APIXABAN 5 MG PO TABS
5.0000 mg | ORAL_TABLET | Freq: Two times a day (BID) | ORAL | Status: DC
  Administered 2018-07-20 – 2018-07-21 (×4): 5 mg via ORAL
  Filled 2018-07-19 (×4): qty 1

## 2018-07-19 MED ORDER — SODIUM CHLORIDE 0.9% FLUSH
3.0000 mL | Freq: Once | INTRAVENOUS | Status: AC
  Administered 2018-07-19: 3 mL via INTRAVENOUS

## 2018-07-19 MED ORDER — FUROSEMIDE 10 MG/ML IJ SOLN
60.0000 mg | Freq: Once | INTRAMUSCULAR | Status: AC
  Administered 2018-07-19: 60 mg via INTRAVENOUS
  Filled 2018-07-19: qty 6

## 2018-07-19 MED ORDER — SENNA 8.6 MG PO TABS
1.0000 | ORAL_TABLET | Freq: Two times a day (BID) | ORAL | Status: DC
  Administered 2018-07-20 (×2): 8.6 mg via ORAL
  Filled 2018-07-19 (×4): qty 1

## 2018-07-19 MED ORDER — OXYCODONE HCL 5 MG PO TABS: 5.0000 mg | ORAL_TABLET | ORAL | Status: DC | PRN

## 2018-07-19 MED ORDER — POLYETHYLENE GLYCOL 3350 17 G PO PACK
17.0000 g | PACK | Freq: Every day | ORAL | Status: DC | PRN
  Administered 2018-07-20: 17 g via ORAL
  Filled 2018-07-19: qty 1

## 2018-07-19 MED ORDER — ALBUTEROL SULFATE (2.5 MG/3ML) 0.083% IN NEBU: 2.5000 mg | INHALATION_SOLUTION | Freq: Four times a day (QID) | RESPIRATORY_TRACT | Status: DC | PRN

## 2018-07-19 MED ORDER — ACETAMINOPHEN 650 MG RE SUPP: 650.0000 mg | Freq: Four times a day (QID) | RECTAL | Status: DC | PRN

## 2018-07-19 MED ORDER — METOPROLOL SUCCINATE ER 50 MG PO TB24
50.0000 mg | ORAL_TABLET | Freq: Every day | ORAL | Status: DC
  Administered 2018-07-20: 50 mg via ORAL
  Filled 2018-07-19 (×2): qty 1

## 2018-07-19 MED ORDER — ACETAMINOPHEN 325 MG PO TABS
650.0000 mg | ORAL_TABLET | Freq: Four times a day (QID) | ORAL | Status: DC | PRN
  Administered 2018-07-20: 650 mg via ORAL
  Filled 2018-07-19: qty 2

## 2018-07-19 MED ORDER — MONTELUKAST SODIUM 10 MG PO TABS
10.0000 mg | ORAL_TABLET | Freq: Every day | ORAL | Status: DC
  Administered 2018-07-20: 10 mg via ORAL
  Filled 2018-07-19 (×2): qty 1

## 2018-07-19 MED ORDER — ONDANSETRON HCL 4 MG/2ML IJ SOLN
4.0000 mg | Freq: Once | INTRAMUSCULAR | Status: AC
  Administered 2018-07-19: 4 mg via INTRAVENOUS
  Filled 2018-07-19: qty 2

## 2018-07-19 MED ORDER — FUROSEMIDE 10 MG/ML IJ SOLN
40.0000 mg | INTRAMUSCULAR | Status: DC
  Administered 2018-07-20 (×2): 40 mg via INTRAVENOUS
  Filled 2018-07-19 (×3): qty 4

## 2018-07-19 MED ORDER — FERROUS SULFATE 325 (65 FE) MG PO TABS
325.0000 mg | ORAL_TABLET | Freq: Three times a day (TID) | ORAL | Status: DC
  Administered 2018-07-20 – 2018-07-21 (×4): 325 mg via ORAL
  Filled 2018-07-19 (×4): qty 1

## 2018-07-19 MED ORDER — MORPHINE SULFATE (PF) 4 MG/ML IV SOLN
4.0000 mg | Freq: Once | INTRAVENOUS | Status: AC
  Administered 2018-07-19: 4 mg via INTRAVENOUS
  Filled 2018-07-19: qty 1

## 2018-07-19 MED ORDER — INSULIN ASPART 100 UNIT/ML ~~LOC~~ SOLN
0.0000 [IU] | Freq: Three times a day (TID) | SUBCUTANEOUS | Status: DC
  Administered 2018-07-20: 1 [IU] via SUBCUTANEOUS
  Administered 2018-07-21: 2 [IU] via SUBCUTANEOUS

## 2018-07-19 NOTE — ED Provider Notes (Signed)
[facility] EMERGENCY DEPARTMENT Provider Note   CSN: [id number] Arrival date & time: 07/19/18  0935     History   Chief Complaint Chief Complaint  Patient presents with  . Chest Pain    HPI Sharon Spears is a 73 y.o. female with history of CAD, diabetes, COPD who presents with a one-week history of right lower chest pain/right upper abdominal pain.  Patient reports it is worse with breathing.  She denies any associated symptoms including fever, nausea, vomiting, diarrhea, cough.  Patient reports she has shortness of breath on exertion, however this is unchanged from baseline and managed by her pulmonary doctor.  Patient has no history of gallbladder disease.     HPI  Past Medical History:  Diagnosis Date  . Adjustment disorder with anxiety   . Asthma   . Benign essential hypertension   . Coronary artery disease   . Diabetic neuropathic arthritis ([place])   . Heart palpitations   . Uncontrolled diabetes mellitus type 2 without complications [facility])     Patient Active Problem List   Diagnosis Date Noted  . Mild intermittent asthma with exacerbation 06/12/2018  . Cough 02/08/2018  . DOE (dyspnea on exertion) 02/08/2018  . Acute on chronic left systolic heart failure ([place]) 09/25/2017  . Heart palpitations 06/02/2016  . Acute on chronic respiratory failure with hypoxia ([place]) 03/20/2015  . Asthma exacerbation 03/20/2015  . Laryngitis, acute 03/20/2015  . Thrombocytopenia ([place]) 03/20/2015  . Diabetes mellitus type 2, controlled ([place]) 03/20/2015  . HTN (hypertension) 03/20/2015  . S/P CABG (coronary artery bypass graft) 03/20/2015    Past Surgical History:  Procedure Laterality Date  . CARDIAC SURGERY    . CESAREAN SECTION    . CORONARY ARTERY BYPASS GRAFT    . ESOPHAGEAL MANOMETRY N/A 03/06/2012   Procedure: ESOPHAGEAL MANOMETRY (EM);  Surgeon: Beryle Beams, MD;  Location: [facility];  Service: Endoscopy;  Laterality: N/A;  . TONSILLECTOMY       OB  History   No obstetric history on file.      Home Medications    Prior to Admission medications   Medication Sig Start Date End Date Taking? Authorizing Provider  acetaminophen (TYLENOL) 500 MG tablet Take 1,000 mg by mouth every 6 (six) hours as needed (pain).     [provider]  albuterol (PROVENTIL) (2.5 MG/3ML) 0.083% nebulizer solution Take 3 mLs (2.5 mg total) by nebulization every 6 (six) hours as needed for wheezing or shortness of breath. 06/12/18   Fenton Foy, NP  albuterol (VENTOLIN HFA) 108 (90 Base) MCG/ACT inhaler Inhale 2 puffs into the lungs every 6 (six) hours as needed for wheezing. 06/12/18   Fenton Foy, NP  apixaban (ELIQUIS) 5 MG TABS tablet Take 1 tablet (5 mg total) by mouth 2 (two) times daily. 09/28/17   Charolette Forward, MD  azithromycin (ZITHROMAX) 250 MG tablet Take 2 tablets (500 mg) on day 1, then take 1 tablet (250 mg) on days 2-5 06/12/18   Fenton Foy, NP  azithromycin (ZITHROMAX) 250 MG tablet Take 2 tablets today and then 1 tablet daily 07/10/18   Fenton Foy, NP  famotidine (PEPCID) 20 MG tablet Take 20 mg by mouth daily before breakfast.  02/04/15   [provider]  ferrous sulfate 325 (65 FE) MG tablet Take 1 tablet (325 mg total) by mouth 3 (three) times daily with meals. 09/28/17   Charolette Forward, MD  fluticasone (FLONASE) 50 MCG/ACT nasal spray Place 2  sprays into both nostrils 2 (two) times daily. 05/04/18   Lamptey, Myrene Galas, MD  furosemide (LASIX) 20 MG tablet Take 2 tablets (40 mg total) by mouth daily. Patient taking differently: Take 20 mg by mouth 2 (two) times daily.  09/28/17   Charolette Forward, MD  losartan (COZAAR) 25 MG tablet Take 25 mg by mouth 2 (two) times daily.    [provider]  metFORMIN (GLUCOPHAGE XR) 500 MG 24 hr tablet Take 1 tablet (500 mg total) by mouth daily with breakfast. Patient taking differently: Take 500 mg by mouth 2 (two) times daily.  06/02/16   Golden Circle, FNP  metoprolol  succinate (TOPROL-XL) 50 MG 24 hr tablet Take 50 mg by mouth daily. Take with or immediately following a meal.    [provider]  montelukast (SINGULAIR) 10 MG tablet Take 1 tablet (10 mg total) by mouth at bedtime. 06/12/18   Fenton Foy, NP  Multiple Vitamin (MULTIVITAMIN WITH MINERALS) TABS tablet Take 1 tablet by mouth daily. 09/28/17   Charolette Forward, MD  nitroGLYCERIN (NITROSTAT) 0.4 MG SL tablet Place 0.4 mg under the tongue every 5 (five) minutes as needed for chest pain.     [provider]  Olopatadine HCl 0.2 % SOLN Apply 1 drop to eye daily. Patient not taking: Reported on 06/12/2018 05/04/18   Chase Picket, MD  omeprazole (PRILOSEC) 20 MG capsule Take 1 capsule (20 mg total) by mouth daily. Patient not taking: Reported on 06/12/2018 02/08/18   Lauraine Rinne, NP  predniSONE (DELTASONE) 10 MG tablet Take 55m for 5 days 01/26/18   IGarner Nash DO    Family History Family History  Problem Relation Age of Onset  . Asthma Mother   . Alcohol abuse Father     Social History Social History   Tobacco Use  . Smoking status: Never Smoker  . Smokeless tobacco: Never Used  Substance Use Topics  . Alcohol use: No  . Drug use: No     Allergies   Adhesive [tape] and Penicillins   Review of Systems Review of Systems  Constitutional: Negative for chills and fever.  HENT: Negative for facial swelling and sore throat.   Respiratory: Negative for shortness of breath.   Cardiovascular: Positive for chest pain.  Gastrointestinal: Positive for abdominal pain. Negative for nausea and vomiting.  Genitourinary: Negative for dysuria.  Musculoskeletal: Negative for back pain.  Skin: Negative for rash and wound.  Neurological: Negative for headaches.  Psychiatric/Behavioral: The patient is not nervous/anxious.      Physical Exam Updated Vital Signs BP (!) 170/73   Pulse 64   Temp (!) 97.5 F (36.4 C) (Oral)   Resp 19   SpO2 93%   Physical Exam Vitals  signs and nursing note reviewed.  Constitutional:      General: She is not in acute distress.    Appearance: She is well-developed. She is not diaphoretic.  HENT:     Head: Normocephalic and atraumatic.     Mouth/Throat:     Pharynx: No oropharyngeal exudate.  Eyes:     General: No scleral icterus.       Right eye: No discharge.        Left eye: No discharge.     Conjunctiva/sclera: Conjunctivae normal.     Pupils: Pupils are equal, round, and reactive to light.  Neck:     Musculoskeletal: Normal range of motion and neck supple.     Thyroid: No thyromegaly.  Cardiovascular:     Rate and Rhythm: Normal rate and regular rhythm.     Heart sounds: Normal heart sounds. No murmur. No friction rub. No gallop.   Pulmonary:     Effort: Pulmonary effort is normal. No respiratory distress.     Breath sounds: Normal breath sounds. No stridor. No wheezing or rales.  Abdominal:     General: Bowel sounds are normal. There is no distension.     Palpations: Abdomen is soft.     Tenderness: There is abdominal tenderness. There is no guarding or rebound. Positive signs include Murphy's sign.     Comments: Tenderness in the right upper quadrant, there are 2 well-healed scars from previous surgeries  Lymphadenopathy:     Cervical: No cervical adenopathy.  Skin:    General: Skin is warm and dry.     Coloration: Skin is not pale.     Findings: No rash.  Neurological:     Mental Status: She is alert.     Coordination: Coordination normal.      ED Treatments / Results  Labs (all labs ordered are listed, but only abnormal results are displayed) Labs Reviewed  BASIC METABOLIC PANEL - Abnormal; Notable for the following components:      Result Value   Glucose, Bld 168 (*)    BUN 26 (*)    All other components within normal limits  CBC - Abnormal; Notable for the following components:   Hemoglobin 10.3 (*)    HCT 33.3 (*)    MCV 78.7 (*)    MCH 24.3 (*)    RDW 16.0 (*)    All other  components within normal limits  TROPONIN I (HIGH SENSITIVITY) - Abnormal; Notable for the following components:   Troponin I (High Sensitivity) 21 (*)    All other components within normal limits  TROPONIN I (HIGH SENSITIVITY) - Abnormal; Notable for the following components:   Troponin I (High Sensitivity) 23 (*)    All other components within normal limits  HEPATIC FUNCTION PANEL - Abnormal; Notable for the following components:   Albumin 3.4 (*)    All other components within normal limits  BRAIN NATRIURETIC PEPTIDE - Abnormal; Notable for the following components:   B Natriuretic Peptide 580.5 (*)    All other components within normal limits  D-DIMER, QUANTITATIVE (NOT AT [facility])    EKG EKG Interpretation  Date/Time:  Wednesday July 19 2018 09:44:51 EDT Ventricular Rate:  60 PR Interval:    QRS Duration: 84 QT Interval:  462 QTC Calculation: 462 R Axis:   95 Text Interpretation:  Atrial fibrillation Rightward axis Nonspecific ST abnormality Abnormal ECG similar to previous Confirmed by Theotis Burrow [phone]) on 07/19/2018 10:41:33 AM   Radiology Dg Chest 2 View  Result Date: 07/19/2018 CLINICAL DATA:  Lump noticed at RIGHT-side of chest about a week ago, chest pain, pain radiating to back on RIGHT side, shortness of breath, history coronary disease post CABG in 2019, diabetes mellitus, hypertension, COPD EXAM: CHEST - 2 VIEW COMPARISON:  06/12/2018 FINDINGS: Enlargement of cardiac silhouette post CABG. Mediastinal contours and pulmonary vascularity normal. Atherosclerotic calcification aorta. Bibasilar atelectasis and LEFT pleural effusion again seen, slightly increased. Interval mild increase in LEFT pleural effusion. Upper lungs clear. No pneumothorax or segmental consolidation. Bones demineralized. IMPRESSION: Increased bibasilar atelectasis and LEFT pleural effusion. Enlargement of cardiac silhouette post CABG. Electronically Signed   By: Lavonia Dana M.D.   On: 07/19/2018 10:15    US Abdomen Limited Ruq  Result Date: 07/19/2018 CLINICAL DATA:  Right upper quadrant abdominal pain. EXAM: ULTRASOUND ABDOMEN LIMITED RIGHT UPPER QUADRANT COMPARISON:  Ultrasound of March 15, 2017. FINDINGS: Gallbladder: No gallstones or wall thickening visualized. No sonographic Murphy sign noted by sonographer. 3 mm polyp is noted. Common bile duct: Diameter: 5 mm which is within normal limits. Liver: No focal lesion identified. Increased echogenicity of hepatic parenchyma is noted with slightly nodular hepatic contours suggesting hepatic cirrhosis. Portal vein is patent on color Doppler imaging with normal direction of blood flow towards the liver. IMPRESSION: Findings consistent with hepatic cirrhosis. No focal sonographic abnormality is noted within the liver. Electronically Signed   By: Marijo Conception M.D.   On: 07/19/2018 12:59    Procedures Procedures (including critical care time)  Medications Ordered in ED Medications  sodium chloride flush (NS) 0.9 % injection 3 mL (3 mLs Intravenous Given 07/19/18 1149)  morphine 4 MG/ML injection 4 mg (4 mg Intravenous Given 07/19/18 1151)  ondansetron (ZOFRAN) injection 4 mg (4 mg Intravenous Given 07/19/18 1149)     Initial Impression / Assessment and Plan / ED Course  I have reviewed the triage vital signs and the nursing notes.  Pertinent labs & imaging results that were available during my care of the patient were reviewed by me and considered in my medical decision making (see chart for details).        Patient presenting with right lower chest and right upper abdominal pain.  Her right upper quadrant is tender.  Right upper quadrant ultrasound shows hepatic cirrhosis, but no gallbladder etiology.  Labs are stable for the patient, troponin is mildly elevated at low 20s, but delta is minimally changed.  BMP shows 580.  Chest x-ray shows chronic left pleural effusion.  D-dimer is pending at shift change. At shift change, patient care transferred  to [facility], PA-C for continued evaluation, follow up of D-dimer and determination of disposition. Anticipate discharge if d-dimer negative with follow-up to GI and PCP.  Patient also evaluated by my attending, Dr. Rex Kras, who guided the patient's management and agrees with plan.    Final Clinical Impressions(s) / ED Diagnoses   Final diagnoses:  RUQ pain    ED Discharge Orders    None       Frederica Kuster, PA-C 07/19/18 1557    Little, Wenda Overland, MD 07/30/18 2041

## 2018-07-19 NOTE — ED Notes (Signed)
ED TO INPATIENT HANDOFF REPORT  ED Nurse Name and Phone #: Annie Main [phone]  S Name/Age/Gender Sharon Spears 73 y.o. female Room/Bed: 033C/033C  Code Status   Code Status: Prior  Home/SNF/Other Home Patient oriented to: self, place, time and situation Is this baseline? Yes   Triage Complete: Triage complete  Chief Complaint cp  Triage Note Pt has right sided chest pain that began 1 week ago. Pt states she also has a lump under her right breast and is painful with a deep breath.    Allergies Allergies  Allergen Reactions  . Adhesive [Tape] Itching  . Penicillins Other (See Comments)    GI upset Has patient had a PCN reaction causing immediate rash, facial/tongue/throat swelling, SOB or lightheadedness with hypotension: No Has patient had a PCN reaction causing severe rash involving mucus membranes or skin necrosis: No Has patient had a PCN reaction that required hospitalization: No Has patient had a PCN reaction occurring within the last 10 years: No If all of the above answers are "NO", then may proceed with Cephalosporin use.    Level of Care/Admitting Diagnosis ED Disposition    ED Disposition Condition [facility] Area: [place] [100100]  Level of Care: Telemetry Medical [104]  I expect the patient will be discharged within 24 hours: No (not a candidate for 5C-Observation unit)  Covid Evaluation: Person Under Investigation (PUI)  Diagnosis: Acute respiratory failure with hypoxemia [facility]) [[id number]]  Admitting Physician: Mercy Riding [[id number]]  Attending Physician: Mercy Riding [[id number]]  PT Class (Do Not Modify): Observation [104]  PT Acc Code (Do Not Modify): Observation [10022]       B Medical/Surgery History Past Medical History:  Diagnosis Date  . Adjustment disorder with anxiety   . Asthma   . Benign essential hypertension   . Coronary artery disease   . Diabetic neuropathic arthritis ([place])   . Heart palpitations    . Uncontrolled diabetes mellitus type 2 without complications [facility])    Past Surgical History:  Procedure Laterality Date  . CARDIAC SURGERY    . CESAREAN SECTION    . CORONARY ARTERY BYPASS GRAFT    . ESOPHAGEAL MANOMETRY N/A 03/06/2012   Procedure: ESOPHAGEAL MANOMETRY (EM);  Surgeon: Beryle Beams, MD;  Location[facility];  Service: Endoscopy;  Laterality: N/A;  . TONSILLECTOMY       A IV Location/Drains/Wounds Patient Lines/Drains/Airways Status   Active Line/Drains/Airways    Name:   Placement date:   Placement time:   Site:   Days:   Peripheral IV 07/19/18 Left Hand   07/19/18    1148    Hand   less than 1          Intake/Output Last 24 hours No intake or output data in the 24 hours ending 07/19/18 1751  Labs/Imaging Results for orders placed or performed during the hospital encounter of 07/19/18 (from the past 48 hour(s))  Basic metabolic panel     Status: Abnormal   Collection Time: 07/19/18  9:58 AM  Result Value Ref Range   Sodium 139 135 - 145 mmol/L   Potassium 3.6 3.5 - 5.1 mmol/L   Chloride 106 98 - 111 mmol/L   CO2 22 22 - 32 mmol/L   Glucose, Bld 168 (H) 70 - 99 mg/dL   BUN 26 (H) 8 - 23 mg/dL   Creatinine, Ser 0.87 0.44 - 1.00 mg/dL   Calcium 9.1 8.9 - 10.3 mg/dL   GFR calc  non Af Amer >60 >60 mL/min   GFR calc Af Amer >60 >60 mL/min   Anion gap 11 5 - 15    Comment: Performed at [facility] [address] [place], [place] [address]  CBC     Status: Abnormal   Collection Time: 07/19/18  9:58 AM  Result Value Ref Range   WBC 5.6 4.0 - 10.5 K/uL   RBC 4.23 3.87 - 5.11 MIL/uL   Hemoglobin 10.3 (L) 12.0 - 15.0 g/dL   HCT 33.3 (L) 36.0 - 46.0 %   MCV 78.7 (L) 80.0 - 100.0 fL   MCH 24.3 (L) 26.0 - 34.0 pg   MCHC 30.9 30.0 - 36.0 g/dL   RDW 16.0 (H) 11.5 - 15.5 %   Platelets 176 150 - 400 K/uL   nRBC 0.0 0.0 - 0.2 %    Comment: Performed at [facility], [place] [address] [place], [place] [address]  Troponin I (High Sensitivity)      Status: Abnormal   Collection Time: 07/19/18  9:58 AM  Result Value Ref Range   Troponin I (High Sensitivity) 21 (H) <18 ng/L    Comment: (NOTE) Elevated high sensitivity troponin I (hsTnI) values and significant  changes across serial measurements may suggest ACS but many other  chronic and acute conditions are known to elevate hsTnI results.  Refer to the "Links" section for chest pain algorithms and additional  guidance. Performed at [facility], [place] [address], [place], [place] [address]   Brain natriuretic peptide     Status: Abnormal   Collection Time: 07/19/18  9:58 AM  Result Value Ref Range   B Natriuretic Peptide 580.5 (H) 0.0 - 100.0 pg/mL    Comment: Performed at [facility] [address], [place], [place] [address]  Troponin I (High Sensitivity)     Status: Abnormal   Collection Time: 07/19/18 12:56 PM  Result Value Ref Range   Troponin I (High Sensitivity) 23 (H) <18 ng/L    Comment: (NOTE) Elevated high sensitivity troponin I (hsTnI) values and significant  changes across serial measurements may suggest ACS but many other  chronic and acute conditions are known to elevate hsTnI results.  Refer to the "Links" section for chest pain algorithms and additional  guidance. Performed at [facility], [place] [address], [place], [place] [address]   Hepatic function panel     Status: Abnormal   Collection Time: 07/19/18 12:56 PM  Result Value Ref Range   Total Protein 6.8 6.5 - 8.1 g/dL   Albumin 3.4 (L) 3.5 - 5.0 g/dL   AST 30 15 - 41 U/L   ALT 26 0 - 44 U/L   Alkaline Phosphatase 62 38 - 126 U/L   Total Bilirubin 1.0 0.3 - 1.2 mg/dL   Bilirubin, Direct 0.2 0.0 - 0.2 mg/dL   Indirect Bilirubin 0.8 0.3 - 0.9 mg/dL    Comment: Performed at [facility] [address], [place], [place] [address]  D-dimer, quantitative (not at [facility])     Status: Abnormal   Collection Time: 07/19/18  3:50 PM  Result Value Ref Range   D-Dimer, Quant 1.47 (H)  0.00 - 0.50 ug/mL-FEU    Comment: (NOTE) At the manufacturer cut-off of 0.50 ug/mL FEU, this assay has been documented to exclude PE with a sensitivity and negative predictive value of 97 to 99%.  At this time, this assay has not been approved by the FDA  to exclude DVT/VTE. Results should be correlated with clinical presentation. Performed at [facility], [place] [address], [place], [place] [address]    Dg Chest 2 View  Result Date: 07/19/2018 CLINICAL DATA:  Lump noticed at RIGHT-side of chest about a week ago, chest pain, pain radiating to back on RIGHT side, shortness of breath, history coronary disease post CABG in 2019, diabetes mellitus, hypertension, COPD EXAM: CHEST - 2 VIEW COMPARISON:  06/12/2018 FINDINGS: Enlargement of cardiac silhouette post CABG. Mediastinal contours and pulmonary vascularity normal. Atherosclerotic calcification aorta. Bibasilar atelectasis and LEFT pleural effusion again seen, slightly increased. Interval mild increase in LEFT pleural effusion. Upper lungs clear. No pneumothorax or segmental consolidation. Bones demineralized. IMPRESSION: Increased bibasilar atelectasis and LEFT pleural effusion. Enlargement of cardiac silhouette post CABG. Electronically Signed   By: Lavonia Dana M.D.   On: 07/19/2018 10:15   US Abdomen Limited Ruq  Result Date: 07/19/2018 CLINICAL DATA:  Right upper quadrant abdominal pain. EXAM: ULTRASOUND ABDOMEN LIMITED RIGHT UPPER QUADRANT COMPARISON:  Ultrasound of March 15, 2017. FINDINGS: Gallbladder: No gallstones or wall thickening visualized. No sonographic Murphy sign noted by sonographer. 3 mm polyp is noted. Common bile duct: Diameter: 5 mm which is within normal limits. Liver: No focal lesion identified. Increased echogenicity of hepatic parenchyma is noted with slightly nodular hepatic contours suggesting hepatic cirrhosis. Portal vein is patent on color Doppler imaging with normal direction of blood flow towards the liver.  IMPRESSION: Findings consistent with hepatic cirrhosis. No focal sonographic abnormality is noted within the liver. Electronically Signed   By: Marijo Conception M.D.   On: 07/19/2018 12:59    Pending Labs Unresulted Labs (From admission, onward)    Start     Ordered   07/19/18 1703  SARS Coronavirus 2 (CEPHEID- Performed in [facility] hospital lab), Hosp Order  (Symptomatic Patients Labs with Precautions )  Once,   STAT    Question:  Patient immune status  Answer:  Immunocompromised   07/19/18 1702   Signed and Held  Hemoglobin A1c  Once,   R     Signed and Held   Signed and Held  Basic metabolic panel  Tomorrow morning,   R     Signed and Held   Signed and Held  CBC  Tomorrow morning,   R     Signed and Held          Vitals/Pain Today's Vitals   07/19/18 1345 07/19/18 1512 07/19/18 1730 07/19/18 1745  BP:    (!) 160/80  Pulse: 64  (!) 52   Resp: _0 Temp:      TempSrc:      SpO2: 93%  99%   PainSc:  4       Isolation Precautions Airborne and Contact precautions  Medications Medications  sodium chloride flush (NS) 0.9 % injection 3 mL (3 mLs Intravenous Given 07/19/18 1149)  morphine 4 MG/ML injection 4 mg (4 mg Intravenous Given 07/19/18 1151)  ondansetron (ZOFRAN) injection 4 mg (4 mg Intravenous Given 07/19/18 1149)    Mobility walks with device     Focused Assessments   R Recommendations: See Admitting Provider Note  Report given to:   Additional Notes:

## 2018-07-19 NOTE — ED Provider Notes (Signed)
  Physical Exam  BP (!) 170/73   Pulse 64   Temp (!) 97.5 F (36.4 C) (Oral)   Resp 19   SpO2 93%   Physical Exam  ED Course/Procedures     Procedures  MDM  4:55 PM Patient care received from [facility] law PA at shift change, please see her note for full HPI.  Briefly, patient with a previous history of CAD, diabetes, COPD presents with right lower chest/right upper abdominal pain.  Worse with breathing, no fevers nausea diarrhea or cough.  Short of breath on exertion unchanged from baseline managed by her pulmonary doctor.  Currently on no home oxygen.  Ultrasound obtained which showed new cirrhosis. No gallbladder etiology.  Plan for Ddimer which was elevated today at 1.47 CT Angio to r/o PE was ordered, however patient reports having an anaphylaxis due to contrast when having a prior CT, received EPI.   5:00 PM Patient was ambulated by nursing staff to the bathroom, her oxygen saturation dropped to 85% after ambulation.Due to new oxygen requirement will place call for hospitalist admission.  Covid 19 test ordered.   5:26 PM Spoke to Dr. Cyndia Skeeters who will admit patient for hypoxemia.      Portions of this note were generated with [employer]. Dictation errors may occur despite best attempts at proofreading.          Janeece Fitting, PA-C 07/19/18 [place], [place], DO 07/19/18 [id number]

## 2018-07-19 NOTE — ED Notes (Signed)
Attempted report x 1

## 2018-07-19 NOTE — ED Notes (Signed)
Pt SPO2 dropped to 85% on room air after ambulation

## 2018-07-19 NOTE — ED Notes (Signed)
D-dimer collected and sent at 1545

## 2018-07-19 NOTE — ED Triage Notes (Signed)
Pt has right sided chest pain that began 1 week ago. Pt states she also has a lump under her right breast and is painful with a deep breath.

## 2018-07-19 NOTE — H&P (Signed)
History and Physical    Sharon Spears JSH:[id number] DOB: [birth year] DOA: 07/19/2018  PCP: Charolette Forward, MD Patient coming from: Home.  Chief Complaint: Right subcostal pain  HPI: Sharon Spears is a 73 y.o. female with history of CAD status post CABG in 8502, NIDDM-2, systolic CHF, asthma, moderate pulmonary hypertension and A. fib on Eliquis presenting with worsening right subcostal pain over the last 1 week.   She described the pain as tense and dull.  Pain is constant.  Worse with movement and also deep breathing.  Hip pain was about 10 out of 10 last night but improved after morphine in ED.  Reports radiation to her back on the right. She says she feels short winded with minimal ambulation.  She also reports orthopnea since her bypass surgery last year.  She denies lower extremity edema.  She also noted some swelling across the right subcostal areas.  She thinks she could have hernia.  She denies URI symptoms, fever, chills, emesis, new urinary symptoms.  Denies focal neuro symptoms.  She reports postnasal drainage and chronic cough.  She also reports nausea but no emesis.  She says she has some loose stools due to metformin.  Denies new urinary habits other than frequency due to diuretics.  She reports good compliance with her medication including Eliquis and Lasix.  She denies exposure to COVID of any sick contacts.  Denies NSAID use.  Denies previous history of blood clots.   She says she lives alone.  She denies smoking cigarettes, drinking alcohol recreational drug use.  In ED, slightly hypertensive.  Desaturated to 85% with ambulation on room air.  CBC & CMP not impressive.  BNP 580 (218 about 6 months ago) high-sensitivity troponin 21 and 23.  CXR with cardiomegaly and left-sided pleural effusion slightly increased from previous.  RUQ ultrasound with hepatic cirrhosis.  D-dimer elevated to 1.47.  VQ scan ordered due to history of anaphylaxis to contrast.  COVID-19 screen negative.  Hospitalist service was called due to hypoxemic respiratory failure.  ROS All review of system negative except for pertinent positives and negatives as history of present illness above. PMH Past Medical History:  Diagnosis Date  . Adjustment disorder with anxiety   . Asthma   . Benign essential hypertension   . Coronary artery disease   . Diabetic neuropathic arthritis ([place])   . Heart palpitations   . Uncontrolled diabetes mellitus type 2 without complications ([place])    [place] Past Surgical History:  Procedure Laterality Date  . CARDIAC SURGERY    . CESAREAN SECTION    . CORONARY ARTERY BYPASS GRAFT    . ESOPHAGEAL MANOMETRY N/A 03/06/2012   Procedure: ESOPHAGEAL MANOMETRY (EM);  Surgeon: Beryle Beams, MD;  Location: [facility];  Service: Endoscopy;  Laterality: N/A;  . TONSILLECTOMY     Fam HX Family History  Problem Relation Age of Onset  . Asthma Mother   . Alcohol abuse Father    Social Hx  reports that she has never smoked. She has never used smokeless tobacco. She reports that she does not drink alcohol or use drugs.  Allergy Allergies  Allergen Reactions  . Contrast Media [Iodinated Diagnostic Agents] Anaphylaxis    Per pt she had anaphylaxis reaction to contrast media in the past. States she couldn't breathe and they had to give her epi.   . Adhesive [Tape] Itching  . Penicillins Other (See Comments)    GI upset Has patient had a PCN reaction causing immediate rash, facial/tongue/throat swelling,  SOB or lightheadedness with hypotension: No Has patient had a PCN reaction causing severe rash involving mucus membranes or skin necrosis: No Has patient had a PCN reaction that required hospitalization: No Has patient had a PCN reaction occurring within the last 10 years: No If all of the above answers are "NO", then may proceed with Cephalosporin use.   Home Meds Prior to Admission medications   Medication Sig Start Date End Date Taking? Authorizing Provider   acetaminophen (TYLENOL) 500 MG tablet Take 1,000 mg by mouth every 6 (six) hours as needed (pain).     [provider]  albuterol (PROVENTIL) (2.5 MG/3ML) 0.083% nebulizer solution Take 3 mLs (2.5 mg total) by nebulization every 6 (six) hours as needed for wheezing or shortness of breath. 06/12/18   Fenton Foy, NP  albuterol (VENTOLIN HFA) 108 (90 Base) MCG/ACT inhaler Inhale 2 puffs into the lungs every 6 (six) hours as needed for wheezing. 06/12/18   Fenton Foy, NP  apixaban (ELIQUIS) 5 MG TABS tablet Take 1 tablet (5 mg total) by mouth 2 (two) times daily. 09/28/17   Charolette Forward, MD  azithromycin (ZITHROMAX) 250 MG tablet Take 2 tablets (500 mg) on day 1, then take 1 tablet (250 mg) on days 2-5 06/12/18   Fenton Foy, NP  azithromycin (ZITHROMAX) 250 MG tablet Take 2 tablets today and then 1 tablet daily 07/10/18   Fenton Foy, NP  famotidine (PEPCID) 20 MG tablet Take 20 mg by mouth daily before breakfast.  02/04/15   [provider]  ferrous sulfate 325 (65 FE) MG tablet Take 1 tablet (325 mg total) by mouth 3 (three) times daily with meals. 09/28/17   Charolette Forward, MD  fluticasone (FLONASE) 50 MCG/ACT nasal spray Place 2 sprays into both nostrils 2 (two) times daily. 05/04/18   Lamptey, Myrene Galas, MD  furosemide (LASIX) 20 MG tablet Take 2 tablets (40 mg total) by mouth daily. Patient taking differently: Take 20 mg by mouth 2 (two) times daily.  09/28/17   Charolette Forward, MD  losartan (COZAAR) 25 MG tablet Take 25 mg by mouth 2 (two) times daily.    [provider]  metFORMIN (GLUCOPHAGE XR) 500 MG 24 hr tablet Take 1 tablet (500 mg total) by mouth daily with breakfast. Patient taking differently: Take 500 mg by mouth 2 (two) times daily.  06/02/16   Golden Circle, FNP  metoprolol succinate (TOPROL-XL) 50 MG 24 hr tablet Take 50 mg by mouth daily. Take with or immediately following a meal.    [provider]  montelukast (SINGULAIR) 10 MG  tablet Take 1 tablet (10 mg total) by mouth at bedtime. 06/12/18   Fenton Foy, NP  Multiple Vitamin (MULTIVITAMIN WITH MINERALS) TABS tablet Take 1 tablet by mouth daily. 09/28/17   Charolette Forward, MD  nitroGLYCERIN (NITROSTAT) 0.4 MG SL tablet Place 0.4 mg under the tongue every 5 (five) minutes as needed for chest pain.     [provider]  Olopatadine HCl 0.2 % SOLN Apply 1 drop to eye daily. Patient not taking: Reported on 06/12/2018 05/04/18   Chase Picket, MD  omeprazole (PRILOSEC) 20 MG capsule Take 1 capsule (20 mg total) by mouth daily. Patient not taking: Reported on 06/12/2018 02/08/18   Lauraine Rinne, NP  predniSONE (DELTASONE) 10 MG tablet Take 35m for 5 days 01/26/18   IGarner Nash DO    Physical Exam: Vitals:   07/19/18 1345 07/19/18 1730 07/19/18 1745  07/19/18 1800  BP:   (!) 160/80 (!) 156/73  Pulse: 64 (!) 52    Resp: _0 Temp:      TempSrc:      SpO2: 93% 99%      Constitutional - resting comfortably, no acute distress Eyes - vision grossly intact. Sclera anicteric.  Nose - no gross deformity or drainage Mouth - no oral lesions noted Throat - no swelling or erythema Endo - no obvious thyromegaly CV -irregular rhythm.  Normal rate.. (+)S1S2, no murmurs; JVD to her jaw.  No peripheral edema. Resp -no increased work of breathing.  Slightly diminished aeration on the left.  Crackles over left lower lung field. GI - (+)BS, soft, non-tender, non-distended MSK - normal range of motion, no obvious deformity or edema. Skin - no obvious rashes or lesions Neuro - alert, aware, oriented to person/place/time. No gross deficit Psych - calm, normal mood and affect  Personally Reviewed Radiological Exams Dg Chest 2 View  Result Date: 07/19/2018 CLINICAL DATA:  Lump noticed at RIGHT-side of chest about a week ago, chest pain, pain radiating to back on RIGHT side, shortness of breath, history coronary disease post CABG in 2019, diabetes mellitus,  hypertension, COPD EXAM: CHEST - 2 VIEW COMPARISON:  06/12/2018 FINDINGS: Enlargement of cardiac silhouette post CABG. Mediastinal contours and pulmonary vascularity normal. Atherosclerotic calcification aorta. Bibasilar atelectasis and LEFT pleural effusion again seen, slightly increased. Interval mild increase in LEFT pleural effusion. Upper lungs clear. No pneumothorax or segmental consolidation. Bones demineralized. IMPRESSION: Increased bibasilar atelectasis and LEFT pleural effusion. Enlargement of cardiac silhouette post CABG. Electronically Signed   By: Lavonia Dana M.D.   On: 07/19/2018 10:15   US Abdomen Limited Ruq  Result Date: 07/19/2018 CLINICAL DATA:  Right upper quadrant abdominal pain. EXAM: ULTRASOUND ABDOMEN LIMITED RIGHT UPPER QUADRANT COMPARISON:  Ultrasound of March 15, 2017. FINDINGS: Gallbladder: No gallstones or wall thickening visualized. No sonographic Murphy sign noted by sonographer. 3 mm polyp is noted. Common bile duct: Diameter: 5 mm which is within normal limits. Liver: No focal lesion identified. Increased echogenicity of hepatic parenchyma is noted with slightly nodular hepatic contours suggesting hepatic cirrhosis. Portal vein is patent on color Doppler imaging with normal direction of blood flow towards the liver. IMPRESSION: Findings consistent with hepatic cirrhosis. No focal sonographic abnormality is noted within the liver. Electronically Signed   By: Marijo Conception M.D.   On: 07/19/2018 12:59     Personally Reviewed Labs: CBC: Recent Labs  Lab 07/19/18 0958  WBC 5.6  HGB 10.3*  HCT 33.3*  MCV 78.7*  PLT 161   Basic Metabolic Panel: Recent Labs  Lab 07/19/18 0958  NA 139  K 3.6  CL 106  CO2 22  GLUCOSE 168*  BUN 26*  CREATININE 0.87  CALCIUM 9.1   GFR: CrCl cannot be calculated (Unknown ideal weight.). Liver Function Tests: Recent Labs  Lab 07/19/18 1256  AST 30  ALT 26  ALKPHOS 62  BILITOT 1.0  PROT 6.8  ALBUMIN 3.4*   No results  for input(s): LIPASE, AMYLASE in the last 168 hours. No results for input(s): AMMONIA in the last 168 hours. Coagulation Profile: No results for input(s): INR, PROTIME in the last 168 hours. Cardiac Enzymes: No results for input(s): CKTOTAL, CKMB, CKMBINDEX, TROPONINI in the last 168 hours. BNP (last 3 results) No results for input(s): PROBNP in the last 8760 hours. HbA1C: No results for input(s): HGBA1C in the last 72 hours. CBG: No  results for input(s): GLUCAP in the last 168 hours. Lipid Profile: No results for input(s): CHOL, HDL, LDLCALC, TRIG, CHOLHDL, LDLDIRECT in the last 72 hours. Thyroid Function Tests: No results for input(s): TSH, T4TOTAL, FREET4, T3FREE, THYROIDAB in the last 72 hours. Anemia Panel: No results for input(s): VITAMINB12, FOLATE, FERRITIN, TIBC, IRON, RETICCTPCT in the last 72 hours. Urine analysis:    Component Value Date/Time   COLORURINE YELLOW 01/15/2018 [place] 01/15/2018 1652   LABSPEC 1.019 01/15/2018 1652   PHURINE 5.0 01/15/2018 1652   GLUCOSEU NEGATIVE 01/15/2018 1652   HGBUR SMALL (A) 01/15/2018 1652   BILIRUBINUR NEGATIVE 01/15/2018 1652   KETONESUR NEGATIVE 01/15/2018 1652   PROTEINUR 100 (A) 01/15/2018 1652   UROBILINOGEN 0.2 04/13/2017 1041   NITRITE NEGATIVE 01/15/2018 1652   LEUKOCYTESUR NEGATIVE 01/15/2018 1652    Sepsis Labs:  No leukocytosis.  Personally Reviewed EKG:  Rate controlled A. fib without acute ischemic finding.  Assessment/Plan Acute respiratory failure with hypoxemia: suspect this to be due to CHF exacerbation.  She has dyspnea and orthopnea.  Significant JVD on exam.  BNP higher than baseline.  D-dimer elevated but doubt PE or DVT on Eliquis.  She reports good compliance.  Low suspicion for infectious process.  COVID-19 negative. -Manage CHF as below -Follow-up VQ scan-ordered in ED.  Acute on chronic systolic CHF/moderate pulmonary hypertension/moderate mitral stenosis: echo in 09/2017 with EF  of 45 to 50%, moderate MS, mild to moderate TVR, PAPP to 54 mmHg. Patient with dyspnea, orthopnea, JVD and crackles mainly on left side. BNP higher than baseline.   -IV Lasix 60 mg once, then 40 mg twice daily -Daily weight, intake output and renal functions -Update echocardiogram -May consider consulting cardiology in the morning  History of CAD status post CABG: She has no chest pain.  She has marginal troponin likely due to demand ischemia from above.  EKG reassuring -Continue home cardiac meds  Permanent A. Fib: Chads vasc score of 6.  On metoprolol and Eliquis at home. -Continue home  Left-sided pleural effusion: noted on CXR.  Slightly increased from previous.  This probably related to her CABG and ongoing CHF. -May consider thoracocentesis if no improvement with diuretics.  Elevated d-dimer: Low suspicion for DVT or PE while good compliance with Eliquis. -Follow-up VQ scan-ordered in ED.  History of asthma: Doubt asthma exacerbation at this time. -PRN albuterol -May benefit from controller medications.   NIDDM-2 and on metformin at home -CBG monitoring and sliding scale insulin here  Microcytic anemia: Hgb appears to be at baseline. -Check anemia panel  DVT prophylaxis: On Eliquis  Code Status: Full code Family Communication: Updated patient's daughter over the phone.  Disposition Plan: Admit to inpatient Consults called: None Admission status: Inpatient  Severity of Illness: The appropriate patient status for this patient is INPATIENT. Inpatient status is judged to be reasonable and necessary in order to provide the required intensity of service to ensure the patient's safety. The patient's presenting symptoms, physical exam findings, and initial radiographic and laboratory data in the context of their chronic comorbidities is felt to place them at high risk for further clinical deterioration. Furthermore, it is not anticipated that the patient will be medically stable for  discharge from the hospital within 2 midnights of admission. The following factors support the patient status of inpatient.    "           The patient's presenting symptoms include dyspnea, orthopnea and RUQ pain "  The worrisome physical exam findings include prominent JVD and crackles "           The initial radiographic and laboratory data are worrisome because elevated BNP, troponin, d-dimer, cardiomegaly and left-sided pleural effusion on chest x-ray "           The chronic co-morbidities include CAD status post CABG, systolic CHF, pulmonary hypertension, diabetes, asthma and anemia of chronic disease.      I certify that at the point of admission it is my clinical judgment that the patient will require inpatient hospital care spanning beyond 2 midnights from the point of admission due to high intensity of service, high risk for further deterioration and high frequency of surveillance required.   Mercy Riding MD [facility]  If 7PM-7AM, please contact night-coverage [email or web address] Password [facility]  07/19/2018, 6:46 PM

## 2018-07-19 NOTE — Plan of Care (Signed)
  Problem: Activity: ?Goal: Capacity to carry out activities will improve ?Outcome: Progressing ?  ?

## 2018-07-20 ENCOUNTER — Inpatient Hospital Stay (HOSPITAL_COMMUNITY): Payer: Medicare Other

## 2018-07-20 ENCOUNTER — Encounter (HOSPITAL_COMMUNITY): Payer: Self-pay | Admitting: Family Medicine

## 2018-07-20 DIAGNOSIS — I5023 Acute on chronic systolic (congestive) heart failure: Secondary | ICD-10-CM

## 2018-07-20 DIAGNOSIS — E119 Type 2 diabetes mellitus without complications: Secondary | ICD-10-CM

## 2018-07-20 DIAGNOSIS — K7469 Other cirrhosis of liver: Secondary | ICD-10-CM

## 2018-07-20 DIAGNOSIS — I4891 Unspecified atrial fibrillation: Secondary | ICD-10-CM

## 2018-07-20 DIAGNOSIS — J9601 Acute respiratory failure with hypoxia: Secondary | ICD-10-CM

## 2018-07-20 HISTORY — DX: Other cirrhosis of liver: K74.69

## 2018-07-20 LAB — LIPID PANEL
Cholesterol: 159 mg/dL (ref 0–200)
HDL: 32 mg/dL — ABNORMAL LOW (ref >40)
LDL Cholesterol: 106 mg/dL — ABNORMAL HIGH (ref 0–99)
Total CHOL/HDL Ratio: 5 RATIO
Triglycerides: 103 mg/dL (ref <150)
VLDL: 21 mg/dL (ref 0–40)

## 2018-07-20 LAB — CBC
HCT: 33.4 % — ABNORMAL LOW (ref 36.0–46.0)
Hemoglobin: 10.5 g/dL — ABNORMAL LOW (ref 12.0–15.0)
MCH: 24.6 pg — ABNORMAL LOW (ref 26.0–34.0)
MCHC: 31.4 g/dL (ref 30.0–36.0)
MCV: 78.2 fL — ABNORMAL LOW (ref 80.0–100.0)
Platelets: 182 10*3/uL (ref 150–400)
RBC: 4.27 MIL/uL (ref 3.87–5.11)
RDW: 16.1 % — ABNORMAL HIGH (ref 11.5–15.5)
WBC: 6.2 10*3/uL (ref 4.0–10.5)
nRBC: 0 % (ref 0.0–0.2)

## 2018-07-20 LAB — GLUCOSE, CAPILLARY
Glucose-Capillary: 94 mg/dL (ref 70–99)
Glucose-Capillary: 99 mg/dL (ref 70–99)
Glucose-Capillary: 136 mg/dL — ABNORMAL HIGH (ref 70–99)
Glucose-Capillary: 109 mg/dL — ABNORMAL HIGH (ref 70–99)

## 2018-07-20 LAB — BASIC METABOLIC PANEL
Anion gap: 11 (ref 5–15)
BUN: 25 mg/dL — ABNORMAL HIGH (ref 8–23)
CO2: 24 mmol/L (ref 22–32)
Calcium: 8.6 mg/dL — ABNORMAL LOW (ref 8.9–10.3)
Chloride: 102 mmol/L (ref 98–111)
Creatinine, Ser: 0.98 mg/dL (ref 0.44–1.00)
GFR calc Af Amer: >60 mL/min (ref >60)
GFR calc non Af Amer: 58 mL/min — ABNORMAL LOW (ref >60)
Glucose, Bld: 101 mg/dL — ABNORMAL HIGH (ref 70–99)
Potassium: 3.8 mmol/L (ref 3.5–5.1)
Sodium: 137 mmol/L (ref 135–145)

## 2018-07-20 MED ORDER — HYDRALAZINE HCL 20 MG/ML IJ SOLN: 5.0000 mg | Freq: Four times a day (QID) | INTRAMUSCULAR | Status: DC | PRN

## 2018-07-20 NOTE — Progress Notes (Signed)
Pt. Refusing to have labs drawn at this time. Requested for lab to come back in the am.

## 2018-07-20 NOTE — Progress Notes (Signed)
  Echocardiogram 2D Echocardiogram has been performed.  Sharon Spears M 07/20/2018, 11:42 AM

## 2018-07-20 NOTE — Progress Notes (Addendum)
Pt's blood pressure as follows:   07/20/18 1754  Vitals  Temp 98 F (36.7 C)  Temp Source Oral  BP (!) 153/52  MAP (mmHg) 79  BP Location Right Arm  BP Method Automatic  Patient Position (if appropriate) Sitting  Pulse Rate (!) 54  Pulse Rate Source Monitor  Resp 18  Oxygen Therapy  SpO2 94 %  O2 Device Room Air   Pt asymptomatic. Pain level assessed: per pt statement is a 0, unless coughing to right upper abdomen quadrant.  No orders PRN for blood pressure in place.  Sarajane Jews MD paged to make aware of pt's b/p of today. Hydralazine PRN was added by MD.

## 2018-07-20 NOTE — Progress Notes (Signed)
Patient ambulated in the hallway at room air with oxygen saturation of 95%. Pt denied having shortness of breath, dizziness, or weakness.  Patient ambulated using her walker.  Pt is currently sitting at edge of the bed, at room air, call bell within reach.

## 2018-07-20 NOTE — Progress Notes (Signed)
PROGRESS NOTE  Rocsi Hazelbaker WUJ:[id number] DOB: 1945/06/09 DOA: 07/19/2018 PCP: Charolette Forward, MD  Brief History   73 year old woman mild systolic dysfunction, asthma with chronic dyspnea on exertion Presented with 1 week history of right lower chest pain and right upper abdominal pain worse with breathing.  Reported to have shortness of breath on exertion.  Unchanged, followed by her pulmonologist.  Also reported some right subcostal pain.  A & P  Acute hypoxic respiratory failure thought secondary to acute systolic CHF with JVD on exam on admission --Appears to be resolved at this point.  Will ambulate and test off oxygen.  Improvement correlates with decreasing lower extremity edema.  Acute on chronic systolic CHF complicated by moderate pulmonary hypertension and moderate mitral stenosis.  Echo 2019 LVEF 45-50% with moderate mitral stenosis --Echocardiogram pending --Appears to be responding well to Lasix although quantitative output not recorded. --Continue IV Lasix, check BMP in a.m. --Left pleural effusion is chronic and unchanged.  No further evaluation suggested.  Right-sided costal pain to the xiphoid process.  Presumably secondary to frequent coughing at home.  This correlates with exam and presumption of musculoskeletal pain. --She has no abdominal or epigastric pain.  Gallbladder appears unremarkable and no stones were seen.  LFTs were unremarkable as well as ultrasound in regard to this.  No reason to suspect infectious process.  New diagnosis of cirrhosis? --Based on ultrasound.  Asymptomatic.  LFTs unremarkable.  Recommend follow-up with PCP as an outpatient, consider outpatient GI evaluation.  Elevated d-dimer.  Unclear why this test was obtained in a 73 year old woman with multiple comorbidities.  Expect test would be nondiagnostic. --Low suspicion for DVT or PE with good compliance on Eliquis.  She is not hypoxic at this time, she has no signs or symptoms to suggest VTE  and her presentation is more consistent with acute CHF.  I do not see value in obtaining any further diagnostics.  CAD, status post CABG 2019 --No chest pain.  Minimal troponin elevation not consistent with ACS.  EKG reassuring.  No further evaluation suggested.  Asthma --Followed by Dr. Valeta Harms as an outpatient.  Appears stable.  Permanent atrial fibrillation. CHA2DS2-VASc 6. --Stable on telemetry.  Continue Eliquis  Diabetes mellitus type 2 --CBG stable.  Hold metformin here.  Continue sliding scale insulin.   Seems to be improving but still has lower extremity edema and some shortness of breath.  Continue IV diuresis today, likely will be able to discharge tomorrow if continues to improve.  DVT prophylaxis: apixaban Code Status: Full Family Communication: none Disposition Plan: home    Murray Hodgkins, MD  [facility] Direct contact: see [email or web address] (further directions at bottom of note if needed) 7PM-7AM contact night coverage as at bottom of note 07/20/2018, 12:53 PM  LOS: 1 day   Consultants  .   Procedures  .   Antibiotics  .   Interval History/Subjective  Feels better today.  Breathing okay but does get short of breath when moving around.  Less pain in the right upper chest under the breast.  Frequently coughs at home and tries to force cough.  Reports urinating many times and that her lower extremity edema is substantially improved today.  Objective   Vitals:  Vitals:   07/20/18 0912 07/20/18 1205  BP: (!) 129/55 (!) 156/78  Pulse: 60 (!) 49  Resp: 18 17  Temp: 97.7 F (36.5 C) 98.4 F (36.9 C)  SpO2: 97% 96%    Exam:  Constitutional:  . Appears calm and  comfortable .  Respiratory:  . CTA bilaterally, no w/r/r.  . Respiratory effort normal. No retractions or accessory muscle use Cardiovascular:  . RRR, no m/r/g . 1+ bilateral LE extremity edema   . Right chest wall, breast: Examined with nurse Melva as chaperon.  The overlying skin appears  unremarkable the breast appears unremarkable.  She localizes the pain over her rib cage on the right extending to the xiphoid process.  This is tender to touch.  She does not have any tenderness subcostal.  Overlying skin appears unremarkable. Abdomen:  . Soft, nontender, non-distended.  Dr. upper quadrant without significant pain with palpation. Psychiatric:  . Mental status o Mood, affect appropriate . judgment and insight appear intact    I have personally reviewed the following:   Today's Data  . BMP unremarkable . LDL 106 . Hemoglobin stable at 10.5.  Platelets and WBC within normal limits.  Lab Data  . BNP 580.5 on admission . High sensitive troponin flat, 21-23 . SARS-CoV-2 negative  Micro Data  .   Imaging  . Chest x-ray independently reviewed, modest left pleural effusion without significant change.  Compared to previous study 06/12/2018, also reviewed 1/52020 another previous chest x-rays, this pleural effusion is longstanding. . Right upper quadrant ultrasound per radiology findings consistent with hepatic cirrhosis.  Cardiology Data  . EKG atrial fibrillation, no acute changes.  Other Data  .   Scheduled Meds: . apixaban  5 mg Oral BID  . ferrous sulfate  325 mg Oral TID WC  . furosemide  40 mg Intravenous [id number]  . insulin aspart  0-5 Units Subcutaneous QHS  . insulin aspart  0-9 Units Subcutaneous TID WC  . metoprolol succinate  50 mg Oral Daily  . montelukast  10 mg Oral QHS  . senna  1 tablet Oral BID   Continuous Infusions:  Principal Problem:   Acute respiratory failure with hypoxemia (HCC) Active Problems:   Diabetes mellitus type 2, controlled (HCC)   Acute on chronic left systolic heart failure (HCC)   Other cirrhosis of liver (HCC)   Unspecified atrial fibrillation ([place])   LOS: 1 day   How to contact the [facility] Attending or Consulting provider 7A - 7P or covering provider during after hours [place], for this patient?  1. Check the care team  in [facility] and look for a) attending/consulting [facility] provider listed and b) the [facility] team listed 2. Log into [email or web address] and use [place]'s universal password to access. If you do not have the password, please contact the hospital operator. 3. Locate the [facility] provider you are looking for under [facility] and page to a number that you can be directly reached. 4. If you still have difficulty reaching the provider, please page the [facility] (Director on Call) for the Hospitalists listed on amion for assistance.

## 2018-07-20 NOTE — Progress Notes (Signed)
SATURATION QUALIFICATIONS: (This note is used to comply with regulatory documentation for home oxygen)  Patient Saturations on Room Air at Rest =94%  Patient Saturations on Room Air while Ambulating =95%

## 2018-07-21 ENCOUNTER — Encounter (HOSPITAL_COMMUNITY): Payer: Self-pay | Admitting: Family Medicine

## 2018-07-21 LAB — GLUCOSE, CAPILLARY: Glucose-Capillary: 184 mg/dL — ABNORMAL HIGH (ref 70–99)

## 2018-07-21 MED ORDER — FUROSEMIDE 20 MG PO TABS: 20.0000 mg | ORAL_TABLET | Freq: Two times a day (BID) | ORAL | Status: DC

## 2018-07-21 NOTE — Progress Notes (Signed)
Pt refused her lasix iv. Per pt statement : "my bladder is hurting, and is been hurting all night from peeing a lot. I can't take my lasix" Education given with teach back, however pt continues to refuse.  Sarajane Jews MD paged to make aware.

## 2018-07-21 NOTE — Progress Notes (Signed)
Pt was discharged home.  Discharge instructions and medication education given with teach back.  Questions and concerns answered.  Peripheral iv removed, clean dry and intact, pressure and dressing applied. Patient belongings with pt: personal walker, clothes, shoes, cell phone and her bag.   Pt was transported by nurse tech to [place] entrance were her friend will pick her up.

## 2018-07-21 NOTE — Discharge Summary (Signed)
Physician Discharge Summary  Sharon Spears ZHG:[id number] DOB: 11/01/1945 DOA: 07/19/2018  PCP: Charolette Forward, MD  Admit date: 07/19/2018 Discharge date: 07/21/2018  Recommendations for Outpatient Follow-up:   Acute on chronic systolic CHF complicated by moderate pulmonary hypertension and moderate mitral stenosis.  Echo 2019 LVEF 45-50% with moderate mitral stenosis --Echocardiogram pending, not read at time of discharge. --Please follow-up echocardiogram results  Ultrasound suggested the possibility of cirrhosis. --Based on ultrasound.  Asymptomatic.  LFTs unremarkable.  Recommend follow-up with PCP as an outpatient, consider outpatient GI evaluation, will defer to PCP.   Follow-up Information    Charolette Forward, MD. Schedule an appointment as soon as possible for a visit in 1 week.   Specialty: Cardiology Why: GO: Thurs. July 16 at 2:30pm Contact information: [address] [place] [address] [phone]            Discharge Diagnoses: Principal diagnosis is #1 1. Acute hypoxic respiratory failure thought secondary to acute systolic CHF with JVD on exam on admission 2. Acute on chronic systolic CHF complicated by moderate pulmonary hypertension and moderate mitral stenosis 3. Right-sided costal pain to the xiphoid process 4. Possible cirrhosis 5. CAD, status post CABG 2019 6. Asthma 7. Permanent atrial fibrillation 8. Diabetes mellitus type 2  Discharge Condition: improved Disposition: home  Diet recommendation: heart healthy  Filed Weights   07/20/18 0100 07/21/18 0300  Weight: 65.3 kg 63.6 kg    History of present illness:  73 year old woman mild systolic dysfunction, asthma with chronic dyspnea on exertion Presented with 1 week history of right lower chest pain and right upper abdominal pain worse with breathing.  Reported to have shortness of breath on exertion. Unchanged, followed by her pulmonologist.  Also reported some right  subcostal pain.  Hospital Course:  Patient was admitted for further treatment of acute systolic heart failure and related acute hypoxic respiratory failure.  Excellent diuresis was obtained with resolution of lower extremity edema and hypoxia.  Patient will continue on her usual dose of medications and follow-up with her cardiologist, recommend 1 week.  Echocardiogram performed 7/9 has not yet been read.  We will follow-up this result and discussed with patient or cardiologist if any changes noted.  However given her rapid improvement and no evidence to suggest ACS or ischemia, I do not think it is necessary to obtain this result prior to discharge.  Hospitalization was uncomplicated.  Individual issues as below.  Acute hypoxic respiratory failure thought secondary to acute systolic CHF with JVD on exam on admission --Resolved with treatment of acute CHF.  Acute on chronic systolic CHF complicated by moderate pulmonary hypertension and moderate mitral stenosis.  Echo 2019 LVEF 45-50% with moderate mitral stenosis --Echocardiogram pending --Resolved with aggressive diuresis.   --Left pleural effusion is chronic and unchanged.  No further evaluation suggested.  Right-sided costal pain to the xiphoid process.  Presumably secondary to frequent coughing at home.  This correlated with exam and presumption of musculoskeletal pain. --Minimal costal margin pain.  No further evaluation suggested. --Gallbladder appears unremarkable and no stones were seen.  LFTs were unremarkable as well as ultrasound in regard to this.  No reason to suspect infectious process.  New diagnosis of cirrhosis? --Based on ultrasound.  Asymptomatic.  LFTs unremarkable.  Recommend follow-up with PCP as an outpatient, consider outpatient GI evaluation.  Elevated d-dimer.  Unclear why this test was obtained in a 73 year old woman with multiple comorbidities.  Expect test would be nondiagnostic. --Low suspicion for DVT or  PE with  good compliance on Eliquis.  She is not hypoxic at this time, she has no signs or symptoms to suggest VTE and her presentation is more consistent with acute CHF.    No further diagnostics indicated.  CAD, status post CABG 2019 --No chest pain.  Minimal troponin elevation not consistent with ACS.  EKG reassuring.  No further evaluation suggested.  Asthma --Followed by Dr. Valeta Harms as an outpatient.  Appears stable.  Permanent atrial fibrillation. CHA2DS2-VASc 6. --Stable on telemetry.  Continue Eliquis  Diabetes mellitus type 2 --CBG stable.  Hold metformin here.  Continue sliding scale insulin.  Today's assessment: S: Feels well.  Had some pain in the bladder from frequent urination last night.  Lower extremity edema resolved.  Looking forward to going home.  No chest pain. O: Vitals:  Vitals:   07/21/18 0423 07/21/18 0804  BP: (!) 156/53 (!) 153/40  Pulse: (!) 51 (!) 44  Resp: 18   Temp: 97.6 F (36.4 C)   SpO2: 94%     Constitutional:   Appears calm and comfortable lying nearly flat in bed Respiratory:   CTA bilaterally anteriorly, no w/r/r.   Respiratory effort normal.  Cardiovascular:   RRR, no m/r/g  No LE extremity edema   Psychiatric:   judgement and insight appear normal  Mental status o Mood, affect appropriate  CBG stable.  Discharge Instructions  Discharge Instructions    Diet - low sodium heart healthy   Complete by: As directed    Discharge instructions   Complete by: As directed    Call your physician or seek immediate medical attention for chest pain, shortness of breath or worsening of condition.   Increase activity slowly   Complete by: As directed      Allergies as of 07/21/2018      Reactions   Contrast Media [iodinated Diagnostic Agents] Anaphylaxis   Per pt she had anaphylaxis reaction to contrast media in the past. States she couldn't breathe and they had to give her epi.    Adhesive [tape] Itching   Penicillins Other (See  Comments)   GI upset Has patient had a PCN reaction causing immediate rash, facial/tongue/throat swelling, SOB or lightheadedness with hypotension: No Has patient had a PCN reaction causing severe rash involving mucus membranes or skin necrosis: No Has patient had a PCN reaction that required hospitalization: No Has patient had a PCN reaction occurring within the last 10 years: No If all of the above answers are "NO", then may proceed with Cephalosporin use.      Medication List    STOP taking these medications   azithromycin 250 MG tablet Commonly known as: ZITHROMAX   Olopatadine HCl 0.2 % Soln   omeprazole 20 MG capsule Commonly known as: PRILOSEC   predniSONE 10 MG tablet Commonly known as: DELTASONE     TAKE these medications   acetaminophen 500 MG tablet Commonly known as: TYLENOL Take 1,000 mg by mouth every 6 (six) hours as needed (pain).   albuterol (2.5 MG/3ML) 0.083% nebulizer solution Commonly known as: PROVENTIL Take 3 mLs (2.5 mg total) by nebulization every 6 (six) hours as needed for wheezing or shortness of breath.   albuterol 108 (90 Base) MCG/ACT inhaler Commonly known as: VENTOLIN HFA Inhale 2 puffs into the lungs every 6 (six) hours as needed for wheezing.   apixaban 5 MG Tabs tablet Commonly known as: ELIQUIS Take 1 tablet (5 mg total) by mouth 2 (two) times daily.   famotidine 20  MG tablet Commonly known as: PEPCID Take 20 mg by mouth daily before breakfast.   ferrous sulfate 325 (65 FE) MG tablet Take 1 tablet (325 mg total) by mouth 3 (three) times daily with meals.   fluticasone 50 MCG/ACT nasal spray Commonly known as: FLONASE Place 2 sprays into both nostrils 2 (two) times daily.   furosemide 20 MG tablet Commonly known as: LASIX Take 1 tablet (20 mg total) by mouth 2 (two) times daily.   losartan 25 MG tablet Commonly known as: COZAAR Take 25 mg by mouth 2 (two) times daily.   metFORMIN 500 MG 24 hr tablet Commonly known as:  Glucophage XR Take 1 tablet (500 mg total) by mouth daily with breakfast. What changed: when to take this   metoprolol succinate 50 MG 24 hr tablet Commonly known as: TOPROL-XL Take 50 mg by mouth daily. Take with or immediately following a meal.   montelukast 10 MG tablet Commonly known as: SINGULAIR Take 1 tablet (10 mg total) by mouth at bedtime.   multivitamin with minerals Tabs tablet Take 1 tablet by mouth daily.   nitroGLYCERIN 0.4 MG SL tablet Commonly known as: NITROSTAT Place 0.4 mg under the tongue every 5 (five) minutes as needed for chest pain.      Allergies  Allergen Reactions   Contrast Media [Iodinated Diagnostic Agents] Anaphylaxis    Per pt she had anaphylaxis reaction to contrast media in the past. States she couldn't breathe and they had to give her epi.    Adhesive [Tape] Itching   Penicillins Other (See Comments)    GI upset Has patient had a PCN reaction causing immediate rash, facial/tongue/throat swelling, SOB or lightheadedness with hypotension: No Has patient had a PCN reaction causing severe rash involving mucus membranes or skin necrosis: No Has patient had a PCN reaction that required hospitalization: No Has patient had a PCN reaction occurring within the last 10 years: No If all of the above answers are "NO", then may proceed with Cephalosporin use.    The results of significant diagnostics from this hospitalization (including imaging, microbiology, ancillary and laboratory) are listed below for reference.    Significant Diagnostic Studies: Dg Chest 2 View  Result Date: 07/19/2018 CLINICAL DATA:  Lump noticed at RIGHT-side of chest about a week ago, chest pain, pain radiating to back on RIGHT side, shortness of breath, history coronary disease post CABG in 2019, diabetes mellitus, hypertension, COPD EXAM: CHEST - 2 VIEW COMPARISON:  06/12/2018 FINDINGS: Enlargement of cardiac silhouette post CABG. Mediastinal contours and pulmonary vascularity  normal. Atherosclerotic calcification aorta. Bibasilar atelectasis and LEFT pleural effusion again seen, slightly increased. Interval mild increase in LEFT pleural effusion. Upper lungs clear. No pneumothorax or segmental consolidation. Bones demineralized. IMPRESSION: Increased bibasilar atelectasis and LEFT pleural effusion. Enlargement of cardiac silhouette post CABG. Electronically Signed   By: Lavonia Dana M.D.   On: 07/19/2018 10:15   US Abdomen Limited Ruq  Result Date: 07/19/2018 CLINICAL DATA:  Right upper quadrant abdominal pain. EXAM: ULTRASOUND ABDOMEN LIMITED RIGHT UPPER QUADRANT COMPARISON:  Ultrasound of March 15, 2017. FINDINGS: Gallbladder: No gallstones or wall thickening visualized. No sonographic Murphy sign noted by sonographer. 3 mm polyp is noted. Common bile duct: Diameter: 5 mm which is within normal limits. Liver: No focal lesion identified. Increased echogenicity of hepatic parenchyma is noted with slightly nodular hepatic contours suggesting hepatic cirrhosis. Portal vein is patent on color Doppler imaging with normal direction of blood flow towards the liver. IMPRESSION: Findings consistent  with hepatic cirrhosis. No focal sonographic abnormality is noted within the liver. Electronically Signed   By: Marijo Conception M.D.   On: 07/19/2018 12:59    Microbiology: Recent Results (from the past 240 hour(s))  SARS Coronavirus 2 (CEPHEID- Performed in [place] hospital lab), Hosp Order     Status: None   Collection Time: 07/19/18  5:03 PM   Specimen: Nasopharyngeal Swab  Result Value Ref Range Status   SARS Coronavirus 2 NEGATIVE NEGATIVE Final    Comment: (NOTE) If result is NEGATIVE SARS-CoV-2 target nucleic acids are NOT DETECTED. The SARS-CoV-2 RNA is generally detectable in upper and lower  respiratory specimens during the acute phase of infection. The lowest  concentration of SARS-CoV-2 viral copies this assay can detect is 250  copies / mL. A negative result does not  preclude SARS-CoV-2 infection  and should not be used as the sole basis for treatment or other  patient management decisions.  A negative result may occur with  improper specimen collection / handling, submission of specimen other  than nasopharyngeal swab, presence of viral mutation(s) within the  areas targeted by this assay, and inadequate number of viral copies  (<250 copies / mL). A negative result must be combined with clinical  observations, patient history, and epidemiological information. If result is POSITIVE SARS-CoV-2 target nucleic acids are DETECTED. The SARS-CoV-2 RNA is generally detectable in upper and lower  respiratory specimens dur ing the acute phase of infection.  Positive  results are indicative of active infection with SARS-CoV-2.  Clinical  correlation with patient history and other diagnostic information is  necessary to determine patient infection status.  Positive results do  not rule out bacterial infection or co-infection with other viruses. If result is PRESUMPTIVE POSTIVE SARS-CoV-2 nucleic acids MAY BE PRESENT.   A presumptive positive result was obtained on the submitted specimen  and confirmed on repeat testing.  While 2019 novel coronavirus  (SARS-CoV-2) nucleic acids may be present in the submitted sample  additional confirmatory testing may be necessary for epidemiological  and / or clinical management purposes  to differentiate between  SARS-CoV-2 and other Sarbecovirus currently known to infect humans.  If clinically indicated additional testing with an alternate test  methodology [phone]) is advised. The SARS-CoV-2 RNA is generally  detectable in upper and lower respiratory sp ecimens during the acute  phase of infection. The expected result is Negative. Fact Sheet for Patients:  StrictlyIdeas.no Fact Sheet for Healthcare Providers: BankingDealers.co.za This test is not yet approved or cleared by  the [place] FDA and has been authorized for detection and/or diagnosis of SARS-CoV-2 by FDA under an Emergency Use Authorization (EUA).  This EUA will remain in effect (meaning this test can be used) for the duration of the COVID-19 declaration under Section 564(b)(1) of the Act, 21 U.S.C. section 360bbb-3(b)(1), unless the authorization is terminated or revoked sooner. Performed at [facility], [place] [address] [place], [place] [address]      Labs: Basic Metabolic Panel: Recent Labs  Lab 07/19/18 0958 07/20/18 0524  NA 139 137  K 3.6 3.8  CL 106 102  CO2 22 24  GLUCOSE 168* 101*  BUN 26* 25*  CREATININE 0.87 0.98  CALCIUM 9.1 8.6*   Liver Function Tests: Recent Labs  Lab 07/19/18 1256  AST 30  ALT 26  ALKPHOS 62  BILITOT 1.0  PROT 6.8  ALBUMIN 3.4*   CBC: Recent Labs  Lab 07/19/18 0958 07/20/18 0524  WBC  5.6 6.2  HGB 10.3* 10.5*  HCT 33.3* 33.4*  MCV 78.7* 78.2*  PLT 176 182    Recent Labs    09/25/17 0954 01/21/18 1505 07/19/18 0958  BNP 762.4* 217.9* 580.5*    CBG: Recent Labs  Lab 07/20/18 0701 07/20/18 1202 07/20/18 1708 07/20/18 2125 07/21/18 0621  GLUCAP 94 99 136* 109* 184*    Principal Problem:   Acute on chronic left systolic heart failure (HCC) Active Problems:   Diabetes mellitus type 2, controlled ([place])   Acute respiratory failure with hypoxemia (HCC)   Other cirrhosis of liver (HCC)   Unspecified atrial fibrillation ([place])   Time coordinating discharge: 35 minutes  Signed:  Murray Hodgkins, MD  [facility]  07/21/2018, 10:16 AM

## 2018-07-21 NOTE — Discharge Instructions (Signed)

## 2018-07-24 LAB — ECHOCARDIOGRAM COMPLETE: Weight: 2304 oz

## 2018-08-12 ENCOUNTER — Emergency Department (HOSPITAL_COMMUNITY)
Admission: EM | Admit: 2018-08-12 | Discharge: 2018-08-12 | Disposition: A | Discharge status: Home / Self Care | Payer: Medicare Other | Attending: Emergency Medicine | Admitting: Emergency Medicine

## 2018-08-12 ENCOUNTER — Emergency Department (HOSPITAL_COMMUNITY): Payer: Medicare Other

## 2018-08-12 ENCOUNTER — Emergency Department (HOSPITAL_BASED_OUTPATIENT_CLINIC_OR_DEPARTMENT_OTHER): Payer: Medicare Other

## 2018-08-12 ENCOUNTER — Encounter (HOSPITAL_COMMUNITY): Payer: Self-pay | Admitting: Emergency Medicine

## 2018-08-12 ENCOUNTER — Other Ambulatory Visit: Payer: Self-pay

## 2018-08-12 DIAGNOSIS — Z79899 Other long term (current) drug therapy: Secondary | ICD-10-CM | POA: Insufficient documentation

## 2018-08-12 DIAGNOSIS — M25572 Pain in left ankle and joints of left foot: Secondary | ICD-10-CM | POA: Diagnosis present

## 2018-08-12 DIAGNOSIS — E119 Type 2 diabetes mellitus without complications: Secondary | ICD-10-CM | POA: Insufficient documentation

## 2018-08-12 DIAGNOSIS — Z951 Presence of aortocoronary bypass graft: Secondary | ICD-10-CM | POA: Insufficient documentation

## 2018-08-12 DIAGNOSIS — Z7901 Long term (current) use of anticoagulants: Secondary | ICD-10-CM | POA: Insufficient documentation

## 2018-08-12 DIAGNOSIS — M79609 Pain in unspecified limb: Secondary | ICD-10-CM | POA: Diagnosis not present

## 2018-08-12 DIAGNOSIS — I10 Essential (primary) hypertension: Secondary | ICD-10-CM | POA: Diagnosis not present

## 2018-08-12 DIAGNOSIS — R600 Localized edema: Secondary | ICD-10-CM | POA: Insufficient documentation

## 2018-08-12 DIAGNOSIS — Z7984 Long term (current) use of oral hypoglycemic drugs: Secondary | ICD-10-CM | POA: Diagnosis not present

## 2018-08-12 DIAGNOSIS — I2510 Atherosclerotic heart disease of native coronary artery without angina pectoris: Secondary | ICD-10-CM | POA: Insufficient documentation

## 2018-08-12 DIAGNOSIS — J45909 Unspecified asthma, uncomplicated: Secondary | ICD-10-CM | POA: Diagnosis not present

## 2018-08-12 DIAGNOSIS — M25579 Pain in unspecified ankle and joints of unspecified foot: Secondary | ICD-10-CM

## 2018-08-12 LAB — BASIC METABOLIC PANEL
Anion gap: 7 (ref 5–15)
BUN: 20 mg/dL (ref 8–23)
CO2: 24 mmol/L (ref 22–32)
Calcium: 9 mg/dL (ref 8.9–10.3)
Chloride: 107 mmol/L (ref 98–111)
Creatinine, Ser: 0.86 mg/dL (ref 0.44–1.00)
GFR calc Af Amer: >60 mL/min (ref >60)
GFR calc non Af Amer: >60 mL/min (ref >60)
Glucose, Bld: 207 mg/dL — ABNORMAL HIGH (ref 70–99)
Potassium: 4.9 mmol/L (ref 3.5–5.1)
Sodium: 138 mmol/L (ref 135–145)

## 2018-08-12 LAB — CBC WITH DIFFERENTIAL/PLATELET
Abs Immature Granulocytes: 0.02 10*3/uL (ref 0.00–0.07)
Basophils Absolute: 0 10*3/uL (ref 0.0–0.1)
Basophils Relative: 0 %
Eosinophils Absolute: 0 10*3/uL (ref 0.0–0.5)
Eosinophils Relative: 0 %
HCT: 35.7 % — ABNORMAL LOW (ref 36.0–46.0)
Hemoglobin: 11.3 g/dL — ABNORMAL LOW (ref 12.0–15.0)
Immature Granulocytes: 0 %
Lymphocytes Relative: 21 %
Lymphs Abs: 1.3 10*3/uL (ref 0.7–4.0)
MCH: 24.6 pg — ABNORMAL LOW (ref 26.0–34.0)
MCHC: 31.7 g/dL (ref 30.0–36.0)
MCV: 77.8 fL — ABNORMAL LOW (ref 80.0–100.0)
Monocytes Absolute: 0.6 10*3/uL (ref 0.1–1.0)
Monocytes Relative: 10 %
Neutro Abs: 4.2 10*3/uL (ref 1.7–7.7)
Neutrophils Relative %: 69 %
Platelets: 112 10*3/uL — ABNORMAL LOW (ref 150–400)
RBC: 4.59 MIL/uL (ref 3.87–5.11)
RDW: 16.3 % — ABNORMAL HIGH (ref 11.5–15.5)
WBC: 6.1 10*3/uL (ref 4.0–10.5)
nRBC: 0 % (ref 0.0–0.2)

## 2018-08-12 MED ORDER — HYDROCODONE-ACETAMINOPHEN 5-325 MG PO TABS
1.0000 | ORAL_TABLET | Freq: Once | ORAL | Status: AC
  Administered 2018-08-12: 1 via ORAL
  Filled 2018-08-12: qty 1

## 2018-08-12 MED ORDER — PREDNISONE 20 MG PO TABS: 40.0000 mg | ORAL_TABLET | Freq: Every day | ORAL | 0 refills | Status: DC

## 2018-08-12 NOTE — ED Notes (Signed)
Patient transported to X-ray 

## 2018-08-12 NOTE — Progress Notes (Signed)
VASCULAR LAB PRELIMINARY  PRELIMINARY  PRELIMINARY  PRELIMINARY  Left lower extremity venous duplex completed.    Preliminary report:  See CV proc for preliminary results.   Gave results to Dr. Claris Gladden, [facility], RVT 08/12/2018, 7:37 AM

## 2018-08-12 NOTE — Discharge Instructions (Signed)
Take prednisone, 40 mg daily for 5 days to help with his pain  Follow-up with your doctor as needed  You do not have any blood clots on your ultrasound today.  Emergency department for severe or worsening symptoms.

## 2018-08-12 NOTE — ED Provider Notes (Signed)
Ultrasound tech is reported to preliminary results as negative which is consistent with the patient being anticoagulated.  The patient will be treated with prednisone for an inflammatory type pain, she can follow-up in the outpatient setting.   Noemi Chapel, MD 08/12/18 [phone]

## 2018-08-12 NOTE — ED Provider Notes (Signed)
[name] Provider Note   CSN: [id number] Arrival date & time: 08/12/18  0349     History   Chief Complaint Chief Complaint  Patient presents with  . Ankle Pain    left     HPI Sharon Spears is a 73 y.o. female.     The history is provided by the patient.  Ankle Pain Location:  Ankle Time since incident:  2 weeks Ankle location:  L ankle Pain details:    Quality:  Sharp   Radiates to:  Does not radiate   Severity:  Severe   Onset quality:  Gradual   Timing:  Constant   Progression:  Worsening Chronicity:  New Worsened by:  Bearing weight Ineffective treatments:  Rest Associated symptoms: no fever   Patient with history of atrial fibrillation, CAD presents with left ankle pain.  She reports about 2 weeks ago she thought she injured her ankle because it was swelling.  She saw her orthopedist Raliegh Ip, who performed x-rays, did testing and felt that it was gout.  She reports receiving a cortisone shot.  Several hours ago she had sudden worsening of the left ankle pain.  No new injury.  It hurts to walk.  No fevers or vomiting.  She reports the pain does not extend into her calf  Past Medical History:  Diagnosis Date  . Adjustment disorder with anxiety   . Asthma   . Atrial fibrillation ([place])   . Benign essential hypertension   . Coronary artery disease   . Diabetic neuropathic arthritis ([place])   . Heart palpitations   . Hypoxia 07/19/2018  . Other cirrhosis of liver ([place]) 07/20/2018  . Uncontrolled diabetes mellitus type 2 without complications [facility])     Patient Active Problem List   Diagnosis Date Noted  . Other cirrhosis of liver ([place]) 07/20/2018  . Unspecified atrial fibrillation ([place]) 07/20/2018  . Acute respiratory failure with hypoxemia ([place]) 07/19/2018  . Mild intermittent asthma with exacerbation 06/12/2018  . Cough 02/08/2018  . DOE (dyspnea on exertion) 02/08/2018  . Acute on chronic left systolic heart  failure ([place]) 09/25/2017  . Heart palpitations 06/02/2016  . Laryngitis, acute 03/20/2015  . Thrombocytopenia ([place]) 03/20/2015  . Diabetes mellitus type 2, controlled ([place]) 03/20/2015  . HTN (hypertension) 03/20/2015  . S/P CABG (coronary artery bypass graft) 03/20/2015    Past Surgical History:  Procedure Laterality Date  . CARDIAC SURGERY    . CESAREAN SECTION    . CORONARY ARTERY BYPASS GRAFT    . ESOPHAGEAL MANOMETRY N/A 03/06/2012   Procedure: ESOPHAGEAL MANOMETRY (EM);  Surgeon: Beryle Beams, MD;  Location: [facility];  Service: Endoscopy;  Laterality: N/A;  . TONSILLECTOMY       OB History   No obstetric history on file.      Home Medications    Prior to Admission medications   Medication Sig Start Date End Date Taking? Authorizing Provider  acetaminophen (TYLENOL) 500 MG tablet Take 1,000 mg by mouth every 6 (six) hours as needed (pain).     [provider]  albuterol (PROVENTIL) (2.5 MG/3ML) 0.083% nebulizer solution Take 3 mLs (2.5 mg total) by nebulization every 6 (six) hours as needed for wheezing or shortness of breath. 06/12/18   Fenton Foy, NP  albuterol (VENTOLIN HFA) 108 (90 Base) MCG/ACT inhaler Inhale 2 puffs into the lungs every 6 (six) hours as needed for wheezing. 06/12/18   Fenton Foy, NP  apixaban [place]ne [place]) 5  MG TABS tablet Take 1 tablet (5 mg total) by mouth 2 (two) times daily. 09/28/17   Charolette Forward, MD  famotidine (PEPCID) 20 MG tablet Take 20 mg by mouth daily before breakfast.  02/04/15   [provider]  ferrous sulfate 325 (65 FE) MG tablet Take 1 tablet (325 mg total) by mouth 3 (three) times daily with meals. 09/28/17   Charolette Forward, MD  fluticasone (FLONASE) 50 MCG/ACT nasal spray Place 2 sprays into both nostrils 2 (two) times daily. 05/04/18   Lamptey, Myrene Galas, MD  furosemide (LASIX) 20 MG tablet Take 1 tablet (20 mg total) by mouth 2 (two) times daily. 07/21/18   Samuella Cota, MD  losartan (COZAAR) 25 MG  tablet Take 25 mg by mouth 2 (two) times daily.    [provider]  metFORMIN (GLUCOPHAGE XR) 500 MG 24 hr tablet Take 1 tablet (500 mg total) by mouth daily with breakfast. Patient taking differently: Take 500 mg by mouth 2 (two) times daily.  06/02/16   Golden Circle, FNP  metoprolol succinate (TOPROL-XL) 50 MG 24 hr tablet Take 50 mg by mouth daily. Take with or immediately following a meal.    [provider]  montelukast (SINGULAIR) 10 MG tablet Take 1 tablet (10 mg total) by mouth at bedtime. 06/12/18   Fenton Foy, NP  Multiple Vitamin (MULTIVITAMIN WITH MINERALS) TABS tablet Take 1 tablet by mouth daily. 09/28/17   Charolette Forward, MD  nitroGLYCERIN (NITROSTAT) 0.4 MG SL tablet Place 0.4 mg under the tongue every 5 (five) minutes as needed for chest pain.     [provider]    Family History Family History  Problem Relation Age of Onset  . Asthma Mother   . Alcohol abuse Father     Social History Social History   Tobacco Use  . Smoking status: Never Smoker  . Smokeless tobacco: Never Used  Substance Use Topics  . Alcohol use: No  . Drug use: Never     Allergies   Contrast media [iodinated diagnostic agents], Adhesive [tape], and Penicillins   Review of Systems Review of Systems  Constitutional: Negative for fever.  Cardiovascular: Negative for chest pain.  Musculoskeletal: Positive for arthralgias.  All other systems reviewed and are negative.    Physical Exam Updated Vital Signs BP (!) 126/56   Pulse 95   Temp 98 F (36.7 C) (Oral)   Resp 15   Ht 1.524 m (5')   Wt 64.4 kg   SpO2 97%   BMI 27.73 kg/m   Physical Exam CONSTITUTIONAL: Elderly and frail HEAD: Normocephalic/atraumatic EYES: EOMI ENMT: Mask in place NECK: supple no meningeal signs CV:  no murmurs/rubs/gallops noted LUNGS: Lungs are clear to auscultation bilaterally, no apparent distress ABDOMEN: soft, nontender NEURO: Pt is awake/alert/appropriate, moves  all extremitiesx4.  No facial droop.   EXTREMITIES: pulses normal/equal, full ROM, diffuse tenderness to the left ankle with mild swelling.  Distal pulses intact.  No deformities.  Mild tenderness range of motion of left ankle.  Left calf tenderness noted.  See photo.  There is no thigh tenderness. Left foot is warm to touch No crepitus is noted SKIN: warm, color normal PSYCH: Anxious      Patient gave verbal permission to utilize photo for medical documentation only The image was not stored on any personal device ED Treatments / Results  Labs (all labs ordered are listed, but only abnormal results are displayed) Labs Reviewed  BASIC METABOLIC PANEL - Abnormal;  Notable for the following components:      Result Value   Glucose, Bld 207 (*)    All other components within normal limits  CBC WITH DIFFERENTIAL/PLATELET - Abnormal; Notable for the following components:   Hemoglobin 11.3 (*)    HCT 35.7 (*)    MCV 77.8 (*)    MCH 24.6 (*)    RDW 16.3 (*)    Platelets 112 (*)    All other components within normal limits    EKG None  Radiology Dg Ankle Complete Left  Result Date: 08/12/2018 CLINICAL DATA:  Left ankle pain. EXAM: LEFT ANKLE COMPLETE - 3+ VIEW COMPARISON:  None. FINDINGS: Normal anatomic alignment. No evidence for acute fracture or dislocation. Midfoot degenerative changes. Posterior and plantar calcaneal spurring. Regional soft tissues unremarkable. IMPRESSION: Degenerative changes.  No acute osseous abnormality. Electronically Signed   By: Lovey Newcomer M.D.   On: 08/12/2018 05:51    Procedures Procedures  Medications Ordered in ED Medications  HYDROcodone-acetaminophen (NORCO/VICODIN) 5-325 MG per tablet 1 tablet (1 tablet Oral Given 08/12/18 0532)     Initial Impression / Assessment and Plan / ED Course  I have reviewed the triage vital signs and the nursing notes.  Pertinent labs & imaging results that were available during my care of the patient were reviewed  by me and considered in my medical decision making (see chart for details).        5:49 AM Patient presents with recurrent left ankle pain.  No new trauma.  She has tried Tylenol and BenGay without relief Will obtain xray, labs and reassess Pt is on eliquis and is compliant 6:27 AM Patient feels improved, but still has left calf tenderness and mild edema.  Ankle xray negative Due to persistence of pain, will obtain DVT study given the patient is on Eliquis.  Of note, patient had elevated DVT on hospital stay last month, but this was not explored as patient  was on anticoagulation 6:47 AM Signout to Dr. Sabra Heck to follow-up with DVT study.  If negative patient can be discharged home, will consider prednisone for inflammatory arthritis.  Low suspicion for septic arthritis. Final Clinical Impressions(s) / ED Diagnoses   Final diagnoses:  None    ED Discharge Orders    None       Ripley Fraise, MD 08/12/18 [phone]

## 2018-08-12 NOTE — ED Notes (Signed)
Patient verbalizes understanding of discharge instructions. Opportunity for questioning and answers were provided. Armband removed by staff, pt discharged from ED.  

## 2018-08-12 NOTE — ED Notes (Signed)
ED Provider at bedside. 

## 2018-08-12 NOTE — ED Triage Notes (Signed)
Pt reports left ankle pain going on 2 weeks. Pt reports seeing her ortho doc, [facility], and being given a shot with an ace wrap. Pt reports pain is still present and unbearable.

## 2018-08-21 ENCOUNTER — Ambulatory Visit: Payer: Medicaid Other | Admitting: Pulmonary Disease

## 2018-08-21 NOTE — Progress Notes (Deleted)
Synopsis: Referred in Jan. 2020 for coughing, pain, headache by Charolette Forward, MD  Subjective:   PATIENT ID: Sharon Spears GENDER: female DOB: 1945/10/11, MRN: [id number]  No chief complaint on file.   This is a 73 year old with complaints of fever and body aches.  She was recently treated with an upper respiratory tract infection approximately month ago.  She was seen in the emergency room.  She was referred to the pulmonologist office for respiratory complaints following this.  She does have known congestive heart failure.  She is a lifelong non-smoker.  But she did spend nearly 50 years in [place].  She is concerned for air pollution and smog affect to her pulmonary function.  Over the past several weeks she has had progressive dyspnea.  She states that she has been compliant with her congestive heart failure regimen.  She has noticed some mild increase in the swelling of her legs.  She recently followed up with her cardiologist last week.  She does have cough that is productive at times, chest tightness, denies hemoptysis.  OV 08/21/2018: Back in January patient was diagnosed with human metapneumovirus.  Subsequent follow-up in clinic last seen by Lazaro Arms June 2020 prior to that Wyn Quaker in January 2020.  She has had a few ED hospitalizations for respiratory complaints.  Never smoker.  Diagnosed with asthma-like symptoms.  Did not want to try additional inhalers at last office visit.  Given antibiotic steroids and Singulair at last office visit.  CBC with differential revealed absolute eosinophil count of 0 and IgE of 49 however she was on steroids at that time.   Past Medical History:  Diagnosis Date  . Adjustment disorder with anxiety   . Asthma   . Atrial fibrillation ([place])   . Benign essential hypertension   . Coronary artery disease   . Diabetic neuropathic arthritis ([place])   . Heart palpitations   . Hypoxia 07/19/2018  . Other cirrhosis of liver ([place]) 07/20/2018  .  Uncontrolled diabetes mellitus type 2 without complications ([place])      Family History  Problem Relation Age of Onset  . Asthma Mother   . Alcohol abuse Father      Past Surgical History:  Procedure Laterality Date  . CARDIAC SURGERY    . CESAREAN SECTION    . CORONARY ARTERY BYPASS GRAFT    . ESOPHAGEAL MANOMETRY N/A 03/06/2012   Procedure: ESOPHAGEAL MANOMETRY (EM);  Surgeon: Beryle Beams, MD;  Location: [facility];  Service: Endoscopy;  Laterality: N/A;  . TONSILLECTOMY      Social History   Socioeconomic History  . Marital status: Legally Separated    Spouse name: Not on file  . Number of children: 4  . Years of education: 20  . Highest education level: Not on file  Occupational History  . Occupation: Retired  Scientific laboratory technician  . Financial resource strain: Not on file  . Food insecurity    Worry: Not on file    Inability: Not on file  . Transportation needs    Medical: Not on file    Non-medical: Not on file  Tobacco Use  . Smoking status: Never Smoker  . Smokeless tobacco: Never Used  Substance and Sexual Activity  . Alcohol use: No  . Drug use: Never  . Sexual activity: Yes  Lifestyle  . Physical activity    Days per week: Not on file    Minutes per session: Not on file  . Stress: Not on  file  Relationships  . Social Herbalist on phone: Not on file    Gets together: Not on file    Attends religious service: Not on file    Active member of club or organization: Not on file    Attends meetings of clubs or organizations: Not on file    Relationship status: Not on file  . Intimate partner violence    Fear of current or ex partner: Not on file    Emotionally abused: Not on file    Physically abused: Not on file    Forced sexual activity: Not on file  Other Topics Concern  . Not on file  Social History Narrative   Fun/Hobby: Gardening    Denies abuse and feels safe at home.      Allergies  Allergen Reactions  . Contrast Media [Iodinated  Diagnostic Agents] Anaphylaxis    Per pt she had anaphylaxis reaction to contrast media in the past. States she couldn't breathe and they had to give her epi.   . Adhesive [Tape] Itching  . Penicillins Other (See Comments)    GI upset Has patient had a PCN reaction causing immediate rash, facial/tongue/throat swelling, SOB or lightheadedness with hypotension: No Has patient had a PCN reaction causing severe rash involving mucus membranes or skin necrosis: No Has patient had a PCN reaction that required hospitalization: No Has patient had a PCN reaction occurring within the last 10 years: No If all of the above answers are "NO", then may proceed with Cephalosporin use.     Outpatient Medications Prior to Visit  Medication Sig Dispense Refill  . acetaminophen (TYLENOL) 500 MG tablet Take 1,000 mg by mouth every 6 (six) hours as needed (pain).     Marland Kitchen albuterol (PROVENTIL) (2.5 MG/3ML) 0.083% nebulizer solution Take 3 mLs (2.5 mg total) by nebulization every 6 (six) hours as needed for wheezing or shortness of breath. 75 mL 12  . albuterol (VENTOLIN HFA) 108 (90 Base) MCG/ACT inhaler Inhale 2 puffs into the lungs every 6 (six) hours as needed for wheezing. 3 Inhaler 1  . apixaban (ELIQUIS) 5 MG TABS tablet Take 1 tablet (5 mg total) by mouth 2 (two) times daily. 60 tablet 3  . famotidine (PEPCID) 20 MG tablet Take 20 mg by mouth daily before breakfast.   3  . ferrous sulfate 325 (65 FE) MG tablet Take 1 tablet (325 mg total) by mouth 3 (three) times daily with meals. 90 tablet 3  . fluticasone (FLONASE) 50 MCG/ACT nasal spray Place 2 sprays into both nostrils 2 (two) times daily. 1 g 2  . furosemide (LASIX) 20 MG tablet Take 1 tablet (20 mg total) by mouth 2 (two) times daily.    Marland Kitchen losartan (COZAAR) 25 MG tablet Take 25 mg by mouth 2 (two) times daily.    . metFORMIN (GLUCOPHAGE XR) 500 MG 24 hr tablet Take 1 tablet (500 mg total) by mouth daily with breakfast. (Patient taking differently: Take 500  mg by mouth 2 (two) times daily. ) 30 tablet 2  . metoprolol succinate (TOPROL-XL) 50 MG 24 hr tablet Take 50 mg by mouth daily. Take with or immediately following a meal.    . montelukast (SINGULAIR) 10 MG tablet Take 1 tablet (10 mg total) by mouth at bedtime. 30 tablet 11  . Multiple Vitamin (MULTIVITAMIN WITH MINERALS) TABS tablet Take 1 tablet by mouth daily. 30 tablet 3  . nitroGLYCERIN (NITROSTAT) 0.4 MG SL tablet Place 0.4 mg under  the tongue every 5 (five) minutes as needed for chest pain.     . predniSONE (DELTASONE) 20 MG tablet Take 2 tablets (40 mg total) by mouth daily. 10 tablet 0   No facility-administered medications prior to visit.     ROS   Objective:  Physical Exam   There were no vitals filed for this visit.   on RA BMI Readings from Last 3 Encounters:  08/12/18 27.73 kg/m  07/21/18 24.84 kg/m  06/12/18 25.51 kg/m   Wt Readings from Last 3 Encounters:  08/12/18 142 lb (64.4 kg)  07/21/18 140 lb 3.2 oz (63.6 kg)  06/12/18 144 lb (65.3 kg)     CBC    Component Value Date/Time   WBC 6.1 08/12/2018 0526   RBC 4.59 08/12/2018 0526   HGB 11.3 (L) 08/12/2018 0526   HCT 35.7 (L) 08/12/2018 0526   PLT 112 (L) 08/12/2018 0526   MCV 77.8 (L) 08/12/2018 0526   MCH 24.6 (L) 08/12/2018 0526   MCHC 31.7 08/12/2018 0526   RDW 16.3 (H) 08/12/2018 0526   LYMPHSABS 1.3 08/12/2018 0526   MONOABS 0.6 08/12/2018 0526   EOSABS 0.0 08/12/2018 0526   BASOSABS 0.0 08/12/2018 0526    Chest Imaging: CXR - 01/21/2018 IMPRESSION: Stable cardiomegaly and left greater than right basilar scarring. No active disease. The patient's images have been independently reviewed by me.    Pulmonary Functions Testing Results: PFT Results Latest Ref Rng & Units 02/08/2018  FVC-Pre L 1.46  FVC-Predicted Pre % 58  FVC-Post L 1.46  FVC-Predicted Post % 58  Pre FEV1/FVC % % 80  Post FEV1/FCV % % 82  FEV1-Pre L 1.17  FEV1-Predicted Pre % 62  FEV1-Post L 1.19  DLCO UNC% % 67   DLCO COR %Predicted % 112  TLC L 3.41  TLC % Predicted % 76  RV % Predicted % 92    FeNO: None   Pathology: None   Echocardiogram:  Study Conclusions  - Left ventricle: The cavity size was normal. Systolic function was   mildly reduced. The estimated ejection fraction was in the range   of 45% to 50%. Wall motion was normal; there were no regional   wall motion abnormalities. - Aortic valve: Valve area (VTI): 1.39 cm^2. Valve area (Vmax):   1.59 cm^2. Valve area (Vmean): 1.52 cm^2. - Mitral valve: Calcified annulus. Moderately calcified leaflets .   The findings are consistent with moderate stenosis. Valve area by   continuity equation (using LVOT flow): 0.84 cm^2. - Left atrium: The atrium was mildly dilated. - Right atrium: The atrium was mildly dilated. - Atrial septum: No defect or patent foramen ovale was identified. - Tricuspid valve: There was mild-moderate regurgitation. - Pulmonary arteries: Systolic pressure was moderately increased.   PA peak pressure: 54 mm Hg (S). - Pericardium, extracardiac: There was a left pleural effusion.  Heart Catheterization: no reports I can see    Assessment & Plan:   No diagnosis found.  Discussion:  This is a 73 year old with significant dyspnea on exertion, shortness of breath and a few day history of low-grade temperatures and body aches.  We will screen today with a viral respiratory panel.  However further questioning with most of her symptomatology has been going on and off for approximately a month.  She does have significant secondhand smoke exposure in air pollution exposure for living in [place] for nearly 50 years.  She does have symptoms consistent with chronic heart failure.  She is  currently managed by cardiology for this.  I think we should start with the following: We will give her 40 mg of prednisone for the next 5 days Start her on a new inhaler, Symbicort 160, 2 puffs twice daily Continue albuterol for  shortness of breath and wheezing. She needs full PFTs completed at next office visit. She can follow-up with [place] in approximately 2 weeks to make sure that she is improving. We will call her with any results from the viral respiratory screen. Patient instructed that if she continues to get worse or has recurrent fevers I would have low threshold for two-view chest x-ray.  She is to call our office if this occurs.  Follow-up appointment to be scheduled in 2 weeks with Wyn Quaker, NP.  Currently scheduled for 02/08/2018.  Greater than 50% of this patient 60-minute office visit was spent face-to-face discussing the above recommendations and treatment plan.  As well as the initiation of a new inhaler regimen.  And counseling on obtaining pulmonary function tests prior to next visit.    Current Outpatient Medications:  .  acetaminophen (TYLENOL) 500 MG tablet, Take 1,000 mg by mouth every 6 (six) hours as needed (pain). , Disp: , Rfl:  .  albuterol (PROVENTIL) (2.5 MG/3ML) 0.083% nebulizer solution, Take 3 mLs (2.5 mg total) by nebulization every 6 (six) hours as needed for wheezing or shortness of breath., Disp: 75 mL, Rfl: 12 .  albuterol (VENTOLIN HFA) 108 (90 Base) MCG/ACT inhaler, Inhale 2 puffs into the lungs every 6 (six) hours as needed for wheezing., Disp: 3 Inhaler, Rfl: 1 .  apixaban (ELIQUIS) 5 MG TABS tablet, Take 1 tablet (5 mg total) by mouth 2 (two) times daily., Disp: 60 tablet, Rfl: 3 .  famotidine (PEPCID) 20 MG tablet, Take 20 mg by mouth daily before breakfast. , Disp: , Rfl: 3 .  ferrous sulfate 325 (65 FE) MG tablet, Take 1 tablet (325 mg total) by mouth 3 (three) times daily with meals., Disp: 90 tablet, Rfl: 3 .  fluticasone (FLONASE) 50 MCG/ACT nasal spray, Place 2 sprays into both nostrils 2 (two) times daily., Disp: 1 g, Rfl: 2 .  furosemide (LASIX) 20 MG tablet, Take 1 tablet (20 mg total) by mouth 2 (two) times daily., Disp: , Rfl:  .  losartan (COZAAR) 25 MG tablet, Take  25 mg by mouth 2 (two) times daily., Disp: , Rfl:  .  metFORMIN (GLUCOPHAGE XR) 500 MG 24 hr tablet, Take 1 tablet (500 mg total) by mouth daily with breakfast. (Patient taking differently: Take 500 mg by mouth 2 (two) times daily. ), Disp: 30 tablet, Rfl: 2 .  metoprolol succinate (TOPROL-XL) 50 MG 24 hr tablet, Take 50 mg by mouth daily. Take with or immediately following a meal., Disp: , Rfl:  .  montelukast (SINGULAIR) 10 MG tablet, Take 1 tablet (10 mg total) by mouth at bedtime., Disp: 30 tablet, Rfl: 11 .  Multiple Vitamin (MULTIVITAMIN WITH MINERALS) TABS tablet, Take 1 tablet by mouth daily., Disp: 30 tablet, Rfl: 3 .  nitroGLYCERIN (NITROSTAT) 0.4 MG SL tablet, Place 0.4 mg under the tongue every 5 (five) minutes as needed for chest pain. , Disp: , Rfl:  .  predniSONE (DELTASONE) 20 MG tablet, Take 2 tablets (40 mg total) by mouth daily., Disp: 10 tablet, Rfl: 0   [name], DO [facility] 08/21/2018 8:54 AM

## 2018-09-01 ENCOUNTER — Telehealth: Payer: Self-pay | Admitting: Pulmonary Disease

## 2018-09-01 NOTE — Telephone Encounter (Signed)
Returned call to patient and advised her that she needs to keep appt with Dr. Valeta Harms next week.  Our provider today (B.Mack NP) did not recommend more antibiotics since she has had several courses of this recently and congestion continues.  He also states she needs a thorough evaluation and determine if more testing is needed and Dr. Valeta Harms would be the best at determining how to move forward.    Patient states she understands and will buy some mucinex or OTC meds for the weekend and will be at her scheduled appt here next week.  Also informed her that Dr. Valeta Harms will receive this note regarding her recent concerns.  Report to ED if condition worsens.  Nothing further needed at this time.

## 2018-09-01 NOTE — Telephone Encounter (Signed)
Returned call to patient.  Patient states she's had congestion for 2 or more months. States it gets better with antibiotics then gets worse again. Congestion and cough - white phlegm.  Congestion increasing past week.  Cannot sleep due to cough.   Uses nebulizer at night and albuterol about once a day. SOB may be a little increased but mostly due to coughing.  Nebs and albuterol inhaler don't really help cough.  Inquired about maintenance inhaler - none on list.  Patient states she just has albuterol.  Patient states she is not on any prednisone at this time.  Patient requesting antibiotic to be sent to [facility]  Patient has appt with Icard 09/05/18.  She missed previous appt and was rescheduled.  States she prefers to see him at that time.   Primary Pulmonologist: Icard  Last office visit and with whom: 06/12/18 T. NicholsMild intermittent asthma with exacerbation DOE (dyspnea on exertion) Assessment: Patient is having chest congestion today with productive cough - will treat with azithromycin and prednisone Pulmonary function testing in January did not indicate COPD, slight mid flow reversibility which could indicate asthma Patient could not tolerate Symbicort and refuses to try a different inhaler at this time She wants to continue albuterol as needed - needs refills has been out of medications Plan: Patient Instructions  Continue albuterol inhaler or nebulizer as needed every 4 - 6 hours as needed for wheezing or shortness of breath Will order chest x ray and call with results Will order azithromycin Will order prednisone Will add Singulair daily Will order lab work - CBC w/diff, IGE  Follow up: with Dr. Valeta Harms in 2 months or sooner if needed  What do we see them for (pulmonary problems): asthma/cough  Reason for call: congestion in chest  In the last month, have you been in contact with someone who was confirmed or suspected to have [place] / COVID-19?  Denies - no  travel, stays home   Do you have any of the following symptoms developed in the last 30 days? Fever: denies Cough: yes- congested white but states 'congestion increasing again but has had some for 2 months'  Shortness of breath: mild increase but mostly from coughing  When did your symptoms start?  2 months ago but congestion increasing again past week  If the patient has a fever, what is the last reading?  (use n/a if patient denies fever)  Denies - not taken Have you taken any medication to suppress a fever (ie Ibuprofen, Aleve, Tylenol)?: denies  Routed to B. Mack NP for review and recommendations.

## 2018-09-01 NOTE — Telephone Encounter (Signed)
Patient has had congestion for over 2 months.  Patient has had multiple refills of azithromycin.  We will not be refilling it today.  Patient needs to present to our office on 09/05/2018 for stated office visit with Dr. Valeta Harms.  Likely need to further evaluate patient as symptoms do not seem to be well controlled.  I would recommend that at that office visit patient be considered for sputum cultures to further evaluate patient symptoms.  Patient also may potentially have more of a neutrophilic presentation of asthma.  Patient also may benefit from chronic azithromycin for management of breathing as she is intolerant of inhalers.  If this is considered patient would also need an EKG at that office visit.  Will route to Dr. Valeta Harms as Juluis Rainier.  Wyn Quaker, FNP

## 2018-09-04 ENCOUNTER — Telehealth: Payer: Self-pay

## 2018-09-04 NOTE — Telephone Encounter (Signed)
Called patient to do their pre-visit COVID screening.  Call went to voicemail. Unable to do prescreening.

## 2018-09-05 ENCOUNTER — Ambulatory Visit (INDEPENDENT_AMBULATORY_CARE_PROVIDER_SITE_OTHER): Payer: Medicare Other | Admitting: Pulmonary Disease

## 2018-09-05 ENCOUNTER — Inpatient Hospital Stay: Payer: Medicare Other | Admitting: Internal Medicine

## 2018-09-05 ENCOUNTER — Other Ambulatory Visit: Payer: Self-pay

## 2018-09-05 ENCOUNTER — Encounter: Payer: Self-pay | Admitting: Pulmonary Disease

## 2018-09-05 VITALS — BP 124/78 | HR 72 | Temp 97.4°F | Wt 145.2 lb

## 2018-09-05 DIAGNOSIS — J4521 Mild intermittent asthma with (acute) exacerbation: Secondary | ICD-10-CM

## 2018-09-05 DIAGNOSIS — R059 Cough, unspecified: Secondary | ICD-10-CM

## 2018-09-05 DIAGNOSIS — I5023 Acute on chronic systolic (congestive) heart failure: Secondary | ICD-10-CM | POA: Diagnosis not present

## 2018-09-05 DIAGNOSIS — R0602 Shortness of breath: Secondary | ICD-10-CM | POA: Diagnosis not present

## 2018-09-05 DIAGNOSIS — R05 Cough: Secondary | ICD-10-CM

## 2018-09-05 MED ORDER — BUDESONIDE-FORMOTEROL FUMARATE 160-4.5 MCG/ACT IN AERO: 2.0000 | INHALATION_SPRAY | Freq: Two times a day (BID) | RESPIRATORY_TRACT | 0 refills | Status: DC

## 2018-09-05 NOTE — Progress Notes (Signed)
Synopsis: Referred in Jan. 2020 for coughing, pain, headache by Charolette Forward, MD  Subjective:   PATIENT ID: Sharon Spears GENDER: female DOB: 06-10-1945, MRN: [id number]  Chief Complaint  Patient presents with  . Follow-up    She reports her breathing has been good. She does report increased mucous production at night.     This is a 73 year old with complaints of fever and body aches.  She was recently treated with an upper respiratory tract infection approximately month ago.  She was seen in the emergency room.  She was referred to the pulmonologist office for respiratory complaints following this.  She does have known congestive heart failure.  She is a lifelong non-smoker.  But she did spend nearly 50 years in [place].  She is concerned for air pollution and smog affect to her pulmonary function.  Over the past several weeks she has had progressive dyspnea.  She states that she has been compliant with her congestive heart failure regimen.  She has noticed some mild increase in the swelling of her legs.  She recently followed up with her cardiologist last week.  She does have cough that is productive at times, chest tightness, denies hemoptysis.  OV 09/05/2018: Since last office visit patient has had multiple episodes of recurrent cough and congestion.  She has been seen by 1 of our nurse practitioners in March, ER April x2, office June, ER July for all similar symptoms.  Called into the office again on August 21 with complaints of congestion.  Chest imaging in June stable consistent with prior CABG and sternotomy. Chest x-ray in July with basilar atelectasis in the left pleural effusion.  Echocardiogram in July 2020 with a depressed ejection fraction 45 to 50%.  Left ventricular hypokinesis. She wakes up every night with coughing symptoms. At night time she had worse symptoms.  He has ongoing nocturnal wheezing.  She always feels like she has a tickle in her throat.  During the daytime is  better.  Nighttime is much worse.  She will occasionally steam a pot of hot water and breathe in steam in the middle of the night which helps soothe her coughing spells.   Past Medical History:  Diagnosis Date  . Adjustment disorder with anxiety   . Asthma   . Atrial fibrillation ([place])   . Benign essential hypertension   . Coronary artery disease   . Diabetic neuropathic arthritis ([place])   . Heart palpitations   . Hypoxia 07/19/2018  . Other cirrhosis of liver ([place]) 07/20/2018  . Uncontrolled diabetes mellitus type 2 without complications ([place])      Family History  Problem Relation Age of Onset  . Asthma Mother   . Alcohol abuse Father      Past Surgical History:  Procedure Laterality Date  . CARDIAC SURGERY    . CESAREAN SECTION    . CORONARY ARTERY BYPASS GRAFT    . ESOPHAGEAL MANOMETRY N/A 03/06/2012   Procedure: ESOPHAGEAL MANOMETRY (EM);  Surgeon: Beryle Beams, MD;  Location: [facility];  Service: Endoscopy;  Laterality: N/A;  . TONSILLECTOMY      Social History   Socioeconomic History  . Marital status: Legally Separated    Spouse name: Not on file  . Number of children: 4  . Years of education: 14  . Highest education level: Not on file  Occupational History  . Occupation: Retired  Scientific laboratory technician  . Financial resource strain: Not on file  . Food insecurity  Worry: Not on file    Inability: Not on file  . Transportation needs    Medical: Not on file    Non-medical: Not on file  Tobacco Use  . Smoking status: Never Smoker  . Smokeless tobacco: Never Used  Substance and Sexual Activity  . Alcohol use: No  . Drug use: Never  . Sexual activity: Yes  Lifestyle  . Physical activity    Days per week: Not on file    Minutes per session: Not on file  . Stress: Not on file  Relationships  . Social Herbalist on phone: Not on file    Gets together: Not on file    Attends religious service: Not on file    Active member of club or organization:  Not on file    Attends meetings of clubs or organizations: Not on file    Relationship status: Not on file  . Intimate partner violence    Fear of current or ex partner: Not on file    Emotionally abused: Not on file    Physically abused: Not on file    Forced sexual activity: Not on file  Other Topics Concern  . Not on file  Social History Narrative   Fun/Hobby: Gardening    Denies abuse and feels safe at home.      Allergies  Allergen Reactions  . Contrast Media [Iodinated Diagnostic Agents] Anaphylaxis    Per pt she had anaphylaxis reaction to contrast media in the past. States she couldn't breathe and they had to give her epi.   . Adhesive [Tape] Itching  . Penicillins Other (See Comments)    GI upset Has patient had a PCN reaction causing immediate rash, facial/tongue/throat swelling, SOB or lightheadedness with hypotension: No Has patient had a PCN reaction causing severe rash involving mucus membranes or skin necrosis: No Has patient had a PCN reaction that required hospitalization: No Has patient had a PCN reaction occurring within the last 10 years: No If all of the above answers are "NO", then may proceed with Cephalosporin use.     Outpatient Medications Prior to Visit  Medication Sig Dispense Refill  . albuterol (PROVENTIL) (2.5 MG/3ML) 0.083% nebulizer solution Take 3 mLs (2.5 mg total) by nebulization every 6 (six) hours as needed for wheezing or shortness of breath. 75 mL 12  . albuterol (VENTOLIN HFA) 108 (90 Base) MCG/ACT inhaler Inhale 2 puffs into the lungs every 6 (six) hours as needed for wheezing. 3 Inhaler 1  . apixaban (ELIQUIS) 5 MG TABS tablet Take 1 tablet (5 mg total) by mouth 2 (two) times daily. 60 tablet 3  . famotidine (PEPCID) 20 MG tablet Take 20 mg by mouth daily before breakfast.   3  . ferrous sulfate 325 (65 FE) MG tablet Take 1 tablet (325 mg total) by mouth 3 (three) times daily with meals. 90 tablet 3  . fluticasone (FLONASE) 50 MCG/ACT  nasal spray Place 2 sprays into both nostrils 2 (two) times daily. 1 g 2  . furosemide (LASIX) 20 MG tablet Take 1 tablet (20 mg total) by mouth 2 (two) times daily.    . isosorbide mononitrate (IMDUR) 30 MG 24 hr tablet Take 1 tablet by mouth daily.    Marland Kitchen losartan (COZAAR) 100 MG tablet Take 1 tablet by mouth daily.    . metFORMIN (GLUCOPHAGE XR) 500 MG 24 hr tablet Take 1 tablet (500 mg total) by mouth daily with breakfast. (Patient taking differently: Take 500  mg by mouth 2 (two) times daily. ) 30 tablet 2  . metoprolol succinate (TOPROL-XL) 50 MG 24 hr tablet Take 50 mg by mouth daily. Take with or immediately following a meal.    . montelukast (SINGULAIR) 10 MG tablet Take 1 tablet (10 mg total) by mouth at bedtime. 30 tablet 11  . Multiple Vitamin (MULTIVITAMIN WITH MINERALS) TABS tablet Take 1 tablet by mouth daily. 30 tablet 3  . nitroGLYCERIN (NITROSTAT) 0.4 MG SL tablet Place 0.4 mg under the tongue every 5 (five) minutes as needed for chest pain.     Marland Kitchen zolpidem (AMBIEN) 5 MG tablet Take 1 tablet by mouth at bedtime as needed.     No facility-administered medications prior to visit.     Review of Systems  Constitutional: Negative for chills, fever, malaise/fatigue and weight loss.  HENT: Negative for hearing loss, sore throat and tinnitus.   Eyes: Negative for blurred vision and double vision.  Respiratory: Positive for cough, sputum production and shortness of breath. Negative for hemoptysis, wheezing and stridor.   Cardiovascular: Negative for chest pain, palpitations, orthopnea, leg swelling and PND.  Gastrointestinal: Negative for abdominal pain, constipation, diarrhea, heartburn, nausea and vomiting.  Genitourinary: Negative for dysuria, hematuria and urgency.  Musculoskeletal: Negative for joint pain and myalgias.  Skin: Negative for itching and rash.  Neurological: Negative for dizziness, tingling, weakness and headaches.  Endo/Heme/Allergies: Negative for environmental  allergies. Does not bruise/bleed easily.  Psychiatric/Behavioral: Negative for depression. The patient is not nervous/anxious and does not have insomnia.   All other systems reviewed and are negative.    Objective:  Physical Exam Vitals signs reviewed.  Constitutional:      General: She is not in acute distress.    Appearance: She is well-developed.  HENT:     Head: Normocephalic and atraumatic.  Eyes:     General: No scleral icterus.    Conjunctiva/sclera: Conjunctivae normal.     Pupils: Pupils are equal, round, and reactive to light.  Neck:     Musculoskeletal: Neck supple.     Vascular: No JVD.     Trachea: No tracheal deviation.  Cardiovascular:     Rate and Rhythm: Normal rate and regular rhythm.     Heart sounds: Normal heart sounds. No murmur.  Pulmonary:     Effort: Pulmonary effort is normal. No tachypnea, accessory muscle usage or respiratory distress.     Breath sounds: Normal breath sounds. No stridor. No wheezing, rhonchi or rales.  Abdominal:     General: Bowel sounds are normal. There is no distension.     Palpations: Abdomen is soft.     Tenderness: There is no abdominal tenderness.  Musculoskeletal:        General: No tenderness.     Right lower leg: Edema present.     Left lower leg: Edema present.  Lymphadenopathy:     Cervical: No cervical adenopathy.  Skin:    General: Skin is warm and dry.     Capillary Refill: Capillary refill takes less than 2 seconds.     Findings: No rash.  Neurological:     Mental Status: She is alert and oriented to person, place, and time.  Psychiatric:        Behavior: Behavior normal.      Vitals:   09/05/18 1413  BP: 124/78  Pulse: 72  Temp: (!) 97.4 F (36.3 C)  TempSrc: Temporal  SpO2: 98%  Weight: 145 lb 3.2 oz (65.9 kg)   98%  on RA BMI Readings from Last 3 Encounters:  09/05/18 28.36 kg/m  08/12/18 27.73 kg/m  07/21/18 24.84 kg/m   Wt Readings from Last 3 Encounters:  09/05/18 145 lb 3.2 oz  (65.9 kg)  08/12/18 142 lb (64.4 kg)  07/21/18 140 lb 3.2 oz (63.6 kg)     CBC    Component Value Date/Time   WBC 6.1 08/12/2018 0526   RBC 4.59 08/12/2018 0526   HGB 11.3 (L) 08/12/2018 0526   HCT 35.7 (L) 08/12/2018 0526   PLT 112 (L) 08/12/2018 0526   MCV 77.8 (L) 08/12/2018 0526   MCH 24.6 (L) 08/12/2018 0526   MCHC 31.7 08/12/2018 0526   RDW 16.3 (H) 08/12/2018 0526   LYMPHSABS 1.3 08/12/2018 0526   MONOABS 0.6 08/12/2018 0526   EOSABS 0.0 08/12/2018 0526   BASOSABS 0.0 08/12/2018 0526    Chest Imaging: CXR - 01/21/2018 IMPRESSION: Stable cardiomegaly and left greater than right basilar scarring. No active disease. The patient's images have been independently reviewed by me.   Chest x-ray June and July: Consistent with prior median sternotomy history of CABG. July chest x-ray with left-sided pleural effusion.  Bibasilar atelectasis The patient's images have been independently reviewed by me.    Pulmonary Functions Testing Results: PFT Results Latest Ref Rng & Units 02/08/2018  FVC-Pre L 1.46  FVC-Predicted Pre % 58  FVC-Post L 1.46  FVC-Predicted Post % 58  Pre FEV1/FVC % % 80  Post FEV1/FCV % % 82  FEV1-Pre L 1.17  FEV1-Predicted Pre % 62  FEV1-Post L 1.19  DLCO UNC% % 67  DLCO COR %Predicted % 112  TLC L 3.41  TLC % Predicted % 76  RV % Predicted % 92    FeNO: None   Pathology: None   Echocardiogram:   Echocardiogram July 2020: Depressed ejection fraction 45 to 50%, left ventricular hypokinesis.  Elevated PA pressures  Heart Catheterization: no reports I can see    Assessment & Plan:     ICD-10-CM   1. SOB (shortness of breath)  R06.02   2. Acute on chronic left systolic heart failure (HCC)  I50.23   3. Mild intermittent asthma with exacerbation  J45.21   4. Cough  R05     Discussion:  This is a 73 year old female with ongoing complaints of nocturnal cough sputum production.  She has had asthma for several years in the past.  She does  have known cardiac disease.  I think to medically manage her asthma symptoms best as possible we will start her on a maintenance inhaler of Symbicort 160, 2 puffs twice daily with spacer. She can continue use of her albuterol for shortness of breath and wheezing Continue use of albuterol nebs for shortness of breath and wheezing Also recommend starting her on a over-the-counter intranasal steroid spray.  We will see her back in 6 to 8 weeks to check on her symptoms.  Greater than 50% of this patient's 15-minute office was spent face-to-face discussing the recommendation treatment plan.    Current Outpatient Medications:  .  albuterol (PROVENTIL) (2.5 MG/3ML) 0.083% nebulizer solution, Take 3 mLs (2.5 mg total) by nebulization every 6 (six) hours as needed for wheezing or shortness of breath., Disp: 75 mL, Rfl: 12 .  albuterol (VENTOLIN HFA) 108 (90 Base) MCG/ACT inhaler, Inhale 2 puffs into the lungs every 6 (six) hours as needed for wheezing., Disp: 3 Inhaler, Rfl: 1 .  apixaban (ELIQUIS) 5 MG TABS tablet, Take 1 tablet (5 mg total)  by mouth 2 (two) times daily., Disp: 60 tablet, Rfl: 3 .  famotidine (PEPCID) 20 MG tablet, Take 20 mg by mouth daily before breakfast. , Disp: , Rfl: 3 .  ferrous sulfate 325 (65 FE) MG tablet, Take 1 tablet (325 mg total) by mouth 3 (three) times daily with meals., Disp: 90 tablet, Rfl: 3 .  fluticasone (FLONASE) 50 MCG/ACT nasal spray, Place 2 sprays into both nostrils 2 (two) times daily., Disp: 1 g, Rfl: 2 .  furosemide (LASIX) 20 MG tablet, Take 1 tablet (20 mg total) by mouth 2 (two) times daily., Disp: , Rfl:  .  isosorbide mononitrate (IMDUR) 30 MG 24 hr tablet, Take 1 tablet by mouth daily., Disp: , Rfl:  .  losartan (COZAAR) 100 MG tablet, Take 1 tablet by mouth daily., Disp: , Rfl:  .  metFORMIN (GLUCOPHAGE XR) 500 MG 24 hr tablet, Take 1 tablet (500 mg total) by mouth daily with breakfast. (Patient taking differently: Take 500 mg by mouth 2 (two)  times daily. ), Disp: 30 tablet, Rfl: 2 .  metoprolol succinate (TOPROL-XL) 50 MG 24 hr tablet, Take 50 mg by mouth daily. Take with or immediately following a meal., Disp: , Rfl:  .  montelukast (SINGULAIR) 10 MG tablet, Take 1 tablet (10 mg total) by mouth at bedtime., Disp: 30 tablet, Rfl: 11 .  Multiple Vitamin (MULTIVITAMIN WITH MINERALS) TABS tablet, Take 1 tablet by mouth daily., Disp: 30 tablet, Rfl: 3 .  nitroGLYCERIN (NITROSTAT) 0.4 MG SL tablet, Place 0.4 mg under the tongue every 5 (five) minutes as needed for chest pain. , Disp: , Rfl:  .  zolpidem (AMBIEN) 5 MG tablet, Take 1 tablet by mouth at bedtime as needed., Disp: , Rfl:    [name], DO [facility] 09/05/2018 2:45 PM

## 2018-09-05 NOTE — Addendum Note (Signed)
Addended by: Vivia Ewing on: 09/05/2018 03:06 PM   Modules accepted: Orders

## 2018-09-05 NOTE — Progress Notes (Signed)
Patient seen in the office today and instructed on use of Symbicort 160.  Patient expressed understanding and demonstrated technique.

## 2018-09-05 NOTE — Patient Instructions (Addendum)
Thank you for visiting Dr. Valeta Harms at [facility]. Today we recommend the following:  Start symbicort 160, two puffs twice daily with spacer Continue your albuterol inhaler or nebulizer as needed for shortness of breath or wheezing.  Start flonase or over the counter steroid nasal spray  Return in about 8 weeks (around 10/31/2018).    Please do your part to reduce the spread of COVID-19.

## 2018-09-26 ENCOUNTER — Other Ambulatory Visit: Payer: Self-pay

## 2018-09-26 ENCOUNTER — Telehealth: Payer: Self-pay | Admitting: Pulmonary Disease

## 2018-09-26 MED ORDER — BUDESONIDE-FORMOTEROL FUMARATE 160-4.5 MCG/ACT IN AERO: 2.0000 | INHALATION_SPRAY | Freq: Two times a day (BID) | RESPIRATORY_TRACT | 5 refills | Status: DC

## 2018-09-26 NOTE — Telephone Encounter (Signed)
Call returned to patient, confirmed DOB, confirmed medication and pharmacy. Refill sent. Nothing further needed at this time.

## 2018-10-02 ENCOUNTER — Ambulatory Visit (INDEPENDENT_AMBULATORY_CARE_PROVIDER_SITE_OTHER): Payer: Medicare Other | Admitting: Pulmonary Disease

## 2018-10-02 ENCOUNTER — Encounter (HOSPITAL_COMMUNITY): Payer: Self-pay | Admitting: Emergency Medicine

## 2018-10-02 ENCOUNTER — Encounter: Payer: Self-pay | Admitting: Pulmonary Disease

## 2018-10-02 ENCOUNTER — Ambulatory Visit (HOSPITAL_COMMUNITY)
Admission: EM | Admit: 2018-10-02 | Discharge: 2018-10-02 | Disposition: A | Discharge status: Home / Self Care | Payer: Medicare Other | Attending: Emergency Medicine | Admitting: Emergency Medicine

## 2018-10-02 ENCOUNTER — Telehealth: Payer: Self-pay

## 2018-10-02 ENCOUNTER — Other Ambulatory Visit: Payer: Self-pay

## 2018-10-02 ENCOUNTER — Ambulatory Visit (INDEPENDENT_AMBULATORY_CARE_PROVIDER_SITE_OTHER): Payer: Medicare Other

## 2018-10-02 DIAGNOSIS — J90 Pleural effusion, not elsewhere classified: Secondary | ICD-10-CM | POA: Insufficient documentation

## 2018-10-02 DIAGNOSIS — J4540 Moderate persistent asthma, uncomplicated: Secondary | ICD-10-CM

## 2018-10-02 DIAGNOSIS — I700 Atherosclerosis of aorta: Secondary | ICD-10-CM | POA: Diagnosis not present

## 2018-10-02 DIAGNOSIS — Z20828 Contact with and (suspected) exposure to other viral communicable diseases: Secondary | ICD-10-CM | POA: Diagnosis not present

## 2018-10-02 DIAGNOSIS — K7460 Unspecified cirrhosis of liver: Secondary | ICD-10-CM | POA: Insufficient documentation

## 2018-10-02 DIAGNOSIS — Z7951 Long term (current) use of inhaled steroids: Secondary | ICD-10-CM | POA: Insufficient documentation

## 2018-10-02 DIAGNOSIS — Z79899 Other long term (current) drug therapy: Secondary | ICD-10-CM | POA: Insufficient documentation

## 2018-10-02 DIAGNOSIS — I110 Hypertensive heart disease with heart failure: Secondary | ICD-10-CM | POA: Diagnosis not present

## 2018-10-02 DIAGNOSIS — R0602 Shortness of breath: Secondary | ICD-10-CM

## 2018-10-02 DIAGNOSIS — Z7984 Long term (current) use of oral hypoglycemic drugs: Secondary | ICD-10-CM | POA: Insufficient documentation

## 2018-10-02 DIAGNOSIS — Z951 Presence of aortocoronary bypass graft: Secondary | ICD-10-CM | POA: Diagnosis not present

## 2018-10-02 DIAGNOSIS — I509 Heart failure, unspecified: Secondary | ICD-10-CM | POA: Insufficient documentation

## 2018-10-02 DIAGNOSIS — F4322 Adjustment disorder with anxiety: Secondary | ICD-10-CM | POA: Diagnosis not present

## 2018-10-02 DIAGNOSIS — R05 Cough: Secondary | ICD-10-CM

## 2018-10-02 DIAGNOSIS — Z7901 Long term (current) use of anticoagulants: Secondary | ICD-10-CM | POA: Diagnosis not present

## 2018-10-02 DIAGNOSIS — R059 Cough, unspecified: Secondary | ICD-10-CM

## 2018-10-02 DIAGNOSIS — E119 Type 2 diabetes mellitus without complications: Secondary | ICD-10-CM | POA: Diagnosis not present

## 2018-10-02 DIAGNOSIS — I2510 Atherosclerotic heart disease of native coronary artery without angina pectoris: Secondary | ICD-10-CM | POA: Diagnosis not present

## 2018-10-02 DIAGNOSIS — I4891 Unspecified atrial fibrillation: Secondary | ICD-10-CM | POA: Insufficient documentation

## 2018-10-02 MED ORDER — PREDNISONE 10 MG PO TABS: 40.0000 mg | ORAL_TABLET | Freq: Every day | ORAL | 0 refills | Status: AC

## 2018-10-02 NOTE — ED Provider Notes (Signed)
[place]    CSN: [id number] Arrival date & time: 10/02/18  1537      History   Chief Complaint Chief Complaint  Patient presents with  . Shortness of Breath    HPI Sharon Spears is a 73 y.o. female.   Patient presents with cough and shortness of breath x several months.  She reports she was acutely worse overnight and treated herself with albuterol nebulizers, Zyrtec, and Mucinex.  She denies fever, chills, sore throat, chest pain, abdominal pain, vomiting, diarrhea.  She was seen by her pulmonologist on 09/05/2018 and started on Symbicort.  She states she was told to bring in a sputum specimen but she has not done this.  Medical history significant for CAD, HTN, Asthma, CABG, CHF, DM, Acute Respiratory Failure, Cirrhosis, and A-fib.    The history is provided by the patient.    Past Medical History:  Diagnosis Date  . Adjustment disorder with anxiety   . Asthma   . Atrial fibrillation ([place])   . Benign essential hypertension   . Coronary artery disease   . Diabetic neuropathic arthritis ([place])   . Heart palpitations   . Hypoxia 07/19/2018  . Other cirrhosis of liver ([place]) 07/20/2018  . Uncontrolled diabetes mellitus type 2 without complications [facility])     Patient Active Problem List   Diagnosis Date Noted  . Other cirrhosis of liver ([place]) 07/20/2018  . Unspecified atrial fibrillation ([place]) 07/20/2018  . Acute respiratory failure with hypoxemia ([place]) 07/19/2018  . Asthma 06/12/2018  . Cough 02/08/2018  . Acute on chronic left systolic heart failure ([place]) 09/25/2017  . Thrombocytopenia ([place]) 03/20/2015  . Diabetes mellitus type 2, controlled ([place]) 03/20/2015  . HTN (hypertension) 03/20/2015  . S/P CABG (coronary artery bypass graft) 03/20/2015    Past Surgical History:  Procedure Laterality Date  . CARDIAC SURGERY    . CESAREAN SECTION    . CORONARY ARTERY BYPASS GRAFT    . ESOPHAGEAL MANOMETRY N/A 03/06/2012   Procedure: ESOPHAGEAL MANOMETRY (EM);   Surgeon: Beryle Beams, MD;  Location: [facility];  Service: Endoscopy;  Laterality: N/A;  . TONSILLECTOMY      OB History   No obstetric history on file.      Home Medications    Prior to Admission medications   Medication Sig Start Date End Date Taking? Authorizing Provider  albuterol (PROVENTIL) (2.5 MG/3ML) 0.083% nebulizer solution Take 3 mLs (2.5 mg total) by nebulization every 6 (six) hours as needed for wheezing or shortness of breath. 06/12/18  Yes Fenton Foy, NP  albuterol (VENTOLIN HFA) 108 (90 Base) MCG/ACT inhaler Inhale 2 puffs into the lungs every 6 (six) hours as needed for wheezing. 06/12/18  Yes Fenton Foy, NP  apixaban (ELIQUIS) 5 MG TABS tablet Take 1 tablet (5 mg total) by mouth 2 (two) times daily. 09/28/17  Yes Charolette Forward, MD  budesonide-formoterol (SYMBICORT) 160-4.5 MCG/ACT inhaler Inhale 2 puffs into the lungs every 12 (twelve) hours. 09/26/18  Yes Icard, Octavio Graves, DO  dextromethorphan-guaiFENesin (MUCINEX DM) 30-600 MG 12hr tablet Take 1 tablet by mouth 2 (two) times daily.   Yes [provider]  famotidine (PEPCID) 20 MG tablet Take 20 mg by mouth daily before breakfast.  02/04/15  Yes [provider]  ferrous sulfate 325 (65 FE) MG tablet Take 1 tablet (325 mg total) by mouth 3 (three) times daily with meals. 09/28/17  Yes Charolette Forward, MD  fluticasone (FLONASE) 50 MCG/ACT nasal spray Place 2 sprays  into both nostrils 2 (two) times daily. 05/04/18  Yes Lamptey, Myrene Galas, MD  furosemide (LASIX) 20 MG tablet Take 1 tablet (20 mg total) by mouth 2 (two) times daily. 07/21/18  Yes Samuella Cota, MD  isosorbide mononitrate (IMDUR) 30 MG 24 hr tablet Take 1 tablet by mouth daily. 07/12/18  Yes [provider]  losartan (COZAAR) 100 MG tablet Take 1 tablet by mouth daily. 08/16/18  Yes [provider]  metFORMIN (GLUCOPHAGE XR) 500 MG 24 hr tablet Take 1 tablet (500 mg total) by mouth daily with breakfast. Patient taking  differently: Take 500 mg by mouth 2 (two) times daily.  06/02/16  Yes Golden Circle, FNP  metoprolol succinate (TOPROL-XL) 50 MG 24 hr tablet Take 50 mg by mouth daily. Take with or immediately following a meal.   Yes [provider]  Multiple Vitamin (MULTIVITAMIN WITH MINERALS) TABS tablet Take 1 tablet by mouth daily. 09/28/17  Yes Charolette Forward, MD  montelukast (SINGULAIR) 10 MG tablet Take 1 tablet (10 mg total) by mouth at bedtime. 06/12/18   Fenton Foy, NP  nitroGLYCERIN (NITROSTAT) 0.4 MG SL tablet Place 0.4 mg under the tongue every 5 (five) minutes as needed for chest pain.     [provider]  predniSONE (DELTASONE) 10 MG tablet Take 4 tablets (40 mg total) by mouth daily for 5 days. 10/02/18 10/07/18  Sharion Balloon, NP  zolpidem (AMBIEN) 5 MG tablet Take 1 tablet by mouth at bedtime as needed. 08/16/18   [provider]    Family History Family History  Problem Relation Age of Onset  . Asthma Mother   . Alcohol abuse Father     Social History Social History   Tobacco Use  . Smoking status: Never Smoker  . Smokeless tobacco: Never Used  Substance Use Topics  . Alcohol use: No  . Drug use: Never     Allergies   Contrast media [iodinated diagnostic agents], Adhesive [tape], and Penicillins   Review of Systems Review of Systems  Constitutional: Negative for chills and fever.  HENT: Negative for ear pain and sore throat.   Eyes: Negative for pain and visual disturbance.  Respiratory: Positive for cough and shortness of breath.   Cardiovascular: Negative for chest pain and palpitations.  Gastrointestinal: Negative for abdominal pain and vomiting.  Genitourinary: Negative for dysuria and hematuria.  Musculoskeletal: Negative for arthralgias and back pain.  Skin: Negative for color change and rash.  Neurological: Negative for seizures and syncope.  All other systems reviewed and are negative.    Physical Exam Triage Vital Signs ED  Triage Vitals  Enc Vitals Group     BP 10/02/18 1622 (!) 173/78     Pulse Rate 10/02/18 1622 76     Resp 10/02/18 1622 18     Temp 10/02/18 1622 98.1 F (36.7 C)     Temp Source 10/02/18 1622 Temporal     SpO2 10/02/18 1622 96 %     Weight --      Height --      Head Circumference --      Peak Flow --      Pain Score 10/02/18 1624 8     Pain Loc --      Pain Edu? --      Excl. in [place]? --    No data found.  Updated Vital Signs BP (!) 173/78 (BP Location: Left Arm)   Pulse 76   Temp 98.1 F (36.7  C) (Temporal)   Resp 18   SpO2 96%   Visual Acuity Right Eye Distance:   Left Eye Distance:   Bilateral Distance:    Right Eye Near:   Left Eye Near:    Bilateral Near:     Physical Exam Vitals signs and nursing note reviewed.  Constitutional:      General: She is not in acute distress.    Appearance: She is well-developed. She is not ill-appearing.  HENT:     Head: Normocephalic and atraumatic.     Right Ear: Tympanic membrane normal.     Left Ear: Tympanic membrane normal.     Nose: Nose normal.     Mouth/Throat:     Mouth: Mucous membranes are moist.     Pharynx: Oropharynx is clear.  Eyes:     Conjunctiva/sclera: Conjunctivae normal.  Neck:     Musculoskeletal: Neck supple.  Cardiovascular:     Rate and Rhythm: Normal rate. Rhythm irregular.     Heart sounds: No murmur.  Pulmonary:     Effort: Pulmonary effort is normal. No respiratory distress.     Comments: Diminished in left base.  Abdominal:     General: Bowel sounds are normal.     Palpations: Abdomen is soft.     Tenderness: There is no abdominal tenderness. There is no guarding or rebound.  Skin:    General: Skin is warm and dry.     Findings: No rash.  Neurological:     Mental Status: She is alert.      UC Treatments / Results  Labs (all labs ordered are listed, but only abnormal results are displayed) Labs Reviewed  NOVEL CORONAVIRUS, NAA (HOSP ORDER, SEND-OUT TO REF LAB; TAT 18-24 HRS)     EKG   Radiology Dg Chest 2 View  Result Date: 10/02/2018 CLINICAL DATA:  Cough, low-grade fever and body aches for several months. EXAM: CHEST - 2 VIEW COMPARISON:  PA and lateral chest 07/19/2018 01/21/2018. FINDINGS: Elevation of the left hemidiaphragm is again seen. Streaky bibasilar atelectasis and a small left pleural effusion appear unchanged. There is cardiomegaly without edema. No pneumothorax. The patient is status post CABG. Aortic atherosclerosis noted. IMPRESSION: Streaky bibasilar atelectasis and small left pleural effusion appear unchanged. Electronically Signed   By: Inge Rise M.D.   On: 10/02/2018 17:29    Procedures Procedures (including critical care time)  Medications Ordered in UC Medications - No data to display  Initial Impression / Assessment and Plan / UC Course  I have reviewed the triage vital signs and the nursing notes.  Pertinent labs & imaging results that were available during my care of the patient were reviewed by me and considered in my medical decision making (see chart for details).   Cough and shortness of breath.  Chest xray shows "Streaky bibasilar atelectasis and small left pleural effusion appear unchanged."  Treating with prednisone burst.  Instructed patient to follow-up with her PCP tomorrow.  Instructed her to follow-up with her pulmonologist as scheduled.  Instructed her to go to the emergency department if she develops acute shortness of breath or other concerns.  Patient agrees to plan of care.     Final Clinical Impressions(s) / UC Diagnoses   Final diagnoses:  Shortness of breath  Cough     Discharge Instructions     Take the prednisone as prescribed.    Continue your albuterol nebulizer treatments at home as directed.  Follow-up with your primary care provider tomorrow.  Follow-up with your pulmonologist as scheduled.    Go to the emergency department if you develop acute shortness of breath or other concerns.         ED Prescriptions    Medication Sig Dispense Auth. Provider   predniSONE (DELTASONE) 10 MG tablet Take 4 tablets (40 mg total) by mouth daily for 5 days. 20 tablet Sharion Balloon, NP     PDMP not reviewed this encounter.   Sharion Balloon, NP 10/02/18 1753

## 2018-10-02 NOTE — ED Triage Notes (Addendum)
Sob for several months and has been seen by provider, but last night nothing helped Patient reports cough and phlegm.  No fever  Patient is feeling very tired  Patient keeps referencing something she has googled.    Nebulizer treatment at home did not help

## 2018-10-02 NOTE — Discharge Instructions (Addendum)
Take the prednisone as prescribed.    Continue your albuterol nebulizer treatments at home as directed.  Follow-up with your primary care provider tomorrow.  Follow-up with your pulmonologist as scheduled.    Go to the emergency department if you develop acute shortness of breath or other concerns.    Your blood pressure is elevated today at 173/78.  Please have this rechecked by your primary care provider.

## 2018-10-02 NOTE — Assessment & Plan Note (Addendum)
Plan: We will obtain sputum cultures >>> AFB, fungal, respiratory sputum May need to consider CT imaging based off of sputum culture results   If cough worsens, becomes more purulent with thick productive sputum, or you start to develop fevers please contact our office so that way we can further evaluate you as well as consider outpatient COVID testing.  If symptoms worsen please present to an emergency room or urgent care for further evaluation.  Explained to the patient multiple times that we cannot simply continue to just prescribe Z-Paks as well as prednisone tapers without further evaluation and work-up.  We will hold off on antibiotics at this time as she has had multiple Z-Paks over the last 6 months.

## 2018-10-02 NOTE — Patient Instructions (Addendum)
You were seen today by Lauraine Rinne, NP  for:   1. Cough  We will test her sputum >>> AFB, fungal, respiratory sputum  Please start using a flutter valve:  Use a flutter valve 10 breaths twice a day or 4 to 5 breaths 4-5 times a day to help clear mucus out  Please make sure that you are remaining hydrated and eating well  2. Moderate persistent asthma, unspecified whether complicated  Continue Symbicort 160 >>> 2 puffs in the morning right when you wake up, rinse out your mouth after use, 12 hours later 2 puffs, rinse after use >>> Take this daily, no matter what >>> This is not a rescue inhaler   Continue albuterol nebulizer or rescue inhaler every 6-8 hours for shortness of breath or wheezing  If symptoms worsen please contact our office.    We recommend today:  No orders of the defined types were placed in this encounter.  No orders of the defined types were placed in this encounter.  No orders of the defined types were placed in this encounter.   Follow Up:    Return in about 2 weeks (around 10/16/2018), or if symptoms worsen or fail to improve, for Follow up with Dr. Valeta Harms, Follow up with Wyn Quaker FNP-C.   Please do your part to reduce the spread of COVID-19:      Reduce your risk of any infection  and COVID19 by using the similar precautions used for avoiding the common cold or flu:  Marland Kitchen Wash your hands often with soap and warm water for at least 20 seconds.  If soap and water are not readily available, use an alcohol-based hand sanitizer with at least 60% alcohol.  . If coughing or sneezing, cover your mouth and nose by coughing or sneezing into the elbow areas of your shirt or coat, into a tissue or into your sleeve (not your hands). Langley Gauss A MASK when in public  . Avoid shaking hands with others and consider head nods or verbal greetings only. . Avoid touching your eyes, nose, or mouth with unwashed hands.  . Avoid close contact with people who are sick. .  Avoid places or events with large numbers of people in one location, like concerts or sporting events. . If you have some symptoms but not all symptoms, continue to monitor at home and seek medical attention if your symptoms worsen. . If you are having a medical emergency, call 911.   [place] / e-Visit: [email or web address]         [facility]: [facility]: [phone]                   [facility]: [phone]     It is flu season:   >>> Best ways to protect herself from the flu: Receive the yearly flu vaccine, practice good hand hygiene washing with soap and also using hand sanitizer when available, eat a nutritious meals, get adequate rest, hydrate appropriately   Please contact the office if your symptoms worsen or you have concerns that you are not improving.   Thank you for choosing [facility] for your healthcare, and for allowing [place] to partner with you on your healthcare journey. I am thankful to be able to provide care to you today.   Wyn Quaker FNP-C

## 2018-10-02 NOTE — Telephone Encounter (Signed)
Call returned to patient, confirmed DOB, she reports having increased SOB that started last night. She confirms that she is using symbicort 3x/day which she acknowledges is too much. She reports using the albuterol prn. She reports increased wheezing. She reports using the nebulizer x2 and it has not helped her SOB. Denies fever. She states her chest feels heavy and she is requesting recommendations. Tele-visit made. She is also questioning whether or not she needs a CT or MRI. I made her aware that is something the provider will have to decide. Voiced understanding. Nothing further needed at this time.

## 2018-10-02 NOTE — Assessment & Plan Note (Addendum)
T2 low or neutrophilic asthma? No peripheral eosinophilia on lab work  Plan: Continue Symbicort 160 Continue rescue inhaler and nebulized meds as indicated and needed Close follow-up with our office  We will hold off on additional steroid tapers and/or antibiotics at this time.  If symptoms worsen patient can present to an urgent care or emergency room for further evaluation and physical assessment.

## 2018-10-02 NOTE — Progress Notes (Signed)
Virtual Visit via Telephone Note  I connected with Sharon Spears on 10/02/18 at  3:00 PM EDT by telephone and verified that I am speaking with the correct person using two identifiers.  Location: Patient: Home Provider: [facility] - [address], [place], [place], [place] [address]   I discussed the limitations, risks, security and privacy concerns of performing an evaluation and management service by telephone and the availability of in person appointments. I also discussed with the patient that there may be a patient responsible charge related to this service. The patient expressed understanding and agreed to proceed.  Patient consented to consult via telephone: Yes People present and their role in pt care: Pt     History of Present Illness:  73 year old female never smoker but with prolonged smoke exposure living in [place] for over 50 years referred to our office on 01/26/2018 for prolonged dyspnea.  Also managed for asthma.  PMH: Congestive heart failure Smoker/ Smoking History: Never smoker Maintenance: Symbicort 160 Pt of: Dr. Valeta Harms  Chief complaint: Cough, patient feels she needs antibiotics and steroids  73 year old female never smoker initially referred to our office in January/2020 for prolonged dyspnea and also managed for asthma.  Patient continues to be maintained on Symbicort 160.  She is contacted our office recurrently for antibiotics as well as steroid tapers.  Patient continues to have a productive cough with mucus.  Steroids as well as antibiotics helped temporarily relieve the symptoms but they come back within a week or 2.  She reports that she is short of breath as well as fatigue.  This is her baseline.  She reports she is had the symptoms for 3 to 6 months now.  She started using the nasal steroids with Dr. Valeta Harms suggested and she started to have nosebleeding because she was taking them too often.  She was not following the  instructions.  Patient is a poor historian and is very scattered on the phone today.  Patient's phone also continues to come in and out with lots of background noise.  Patient does not have any recent sputum samples Patient does not have any recent CT axial imaging  Observations/Objective:  02/08/2018-pulmonary function test - FVC 1.46 (58% predicted), postbronchodilator ratio 82, postbronchodilator FEV1 1.19 (63% predicted), no significant bronchodilator response, slight mid flow reversibility after administration of bronchodilators, DLCO is 67 which corrects to 79 based off of hemoglobin.  06/12/2018-IgE-49 06/12/2018-CBC with differential-eosinophils relative 0.6, eosinophils absolute 0  Assessment and Plan:  Asthma T2 low or neutrophilic asthma? No peripheral eosinophilia on lab work  Plan: Continue Symbicort 160 Continue rescue inhaler and nebulized meds as indicated and needed Close follow-up with our office  We will hold off on additional steroid tapers and/or antibiotics at this time.  If symptoms worsen patient can present to an urgent care or emergency room for further evaluation and physical assessment.  Cough Plan: We will obtain sputum cultures >>> AFB, fungal, respiratory sputum May need to consider CT imaging based off of sputum culture results   If cough worsens, becomes more purulent with thick productive sputum, or you start to develop fevers please contact our office so that way we can further evaluate you as well as consider outpatient COVID testing.  If symptoms worsen please present to an emergency room or urgent care for further evaluation.  Explained to the patient multiple times that we cannot simply continue to just prescribe Z-Paks as well as prednisone tapers without further evaluation  and work-up.  We will hold off on antibiotics at this time as she has had multiple Z-Paks over the last 6 months.    Follow Up Instructions:  Return in about 2 weeks  (around 10/16/2018), or if symptoms worsen or fail to improve, for Follow up with Dr. Valeta Harms, Follow up with Wyn Quaker FNP-C.   I discussed the assessment and treatment plan with the patient. The patient was provided an opportunity to ask questions and all were answered. The patient agreed with the plan and demonstrated an understanding of the instructions.   The patient was advised to call back or seek an in-person evaluation if the symptoms worsen or if the condition fails to improve as anticipated.  I provided 28 minutes of non-face-to-face time during this encounter.  This involves multiple telephone calls to reconnect with the patient.   Lauraine Rinne, NP

## 2018-10-03 ENCOUNTER — Other Ambulatory Visit: Payer: Medicare Other

## 2018-10-03 DIAGNOSIS — R059 Cough, unspecified: Secondary | ICD-10-CM

## 2018-10-03 DIAGNOSIS — R05 Cough: Secondary | ICD-10-CM

## 2018-10-03 DIAGNOSIS — J4540 Moderate persistent asthma, uncomplicated: Secondary | ICD-10-CM

## 2018-10-04 LAB — NOVEL CORONAVIRUS, NAA (HOSP ORDER, SEND-OUT TO REF LAB; TAT 18-24 HRS): SARS-CoV-2, NAA: NOT DETECTED

## 2018-10-05 ENCOUNTER — Telehealth: Payer: Self-pay | Admitting: Pulmonary Disease

## 2018-10-05 NOTE — Telephone Encounter (Signed)
ATC pt with her lab result from 10/03/2018 (see result note), line went to voicemail, LMTCB x1.

## 2018-10-05 NOTE — Telephone Encounter (Signed)
Pt returning call for results and can be reached @ [phone].Sharon Spears

## 2018-10-05 NOTE — Progress Notes (Signed)
Preliminary results are showing that you currently have a negative AFB culture.  This is good news.  These cultures will continue to grow out over the next 6 weeks when we will receive a final sputum culture.  If we receive a positive AFB indicating Mycobacterium then we will contact you.  Wyn Quaker FNP

## 2018-10-06 ENCOUNTER — Other Ambulatory Visit: Payer: Self-pay | Admitting: *Deleted

## 2018-10-06 DIAGNOSIS — R05 Cough: Secondary | ICD-10-CM

## 2018-10-06 DIAGNOSIS — R059 Cough, unspecified: Secondary | ICD-10-CM

## 2018-10-06 DIAGNOSIS — J4540 Moderate persistent asthma, uncomplicated: Secondary | ICD-10-CM

## 2018-10-06 LAB — RESPIRATORY CULTURE OR RESPIRATORY AND SPUTUM CULTURE: MICRO NUMBER:: 912146

## 2018-10-06 NOTE — Progress Notes (Signed)
Sorry I know this 1 got confusing because they were duplicate messages.  We need to also let the patient know that her respiratory sputum culture that she provided was canceled due to the fact that you had too many skin cells in it.We are going to have to place a new order for respiratory sputum culture.  Patient will have to be provided another cup.  Patient will need to produce a sputum culture ideally in the early morning right when she is waking up.  She will need to bring this back to our office within 3 hours of producing it so that way we can try to test that.Wyn Quaker, FNP

## 2018-10-06 NOTE — Telephone Encounter (Signed)
Patient contacted with lab results to day by [facility], CMA.  Nothing further at this time.

## 2018-10-06 NOTE — Progress Notes (Signed)
Please notify patient that AFB cultures so far negative.  See previous note.  Unfortunately the patient gave an inadequate sputum sample for respiratory sputum.Please place an order for another respiratory sputum culture.Please have the patient proceed to our office and pick up a respiratory sputum cup.  Patient should produce sputum in the morning for best results.  Will need to return sputum cup to our office within 3 hours of producing sputum.Wyn Quaker, FNP

## 2018-10-08 NOTE — Progress Notes (Signed)
PCCM: Agree. Thanks for speaking with her.  [name], DO [facility] 10/08/2018 1:24 PM

## 2018-10-19 ENCOUNTER — Telehealth: Payer: Self-pay

## 2018-10-19 NOTE — Telephone Encounter (Signed)
NOTES ON FILE FROM [facility] , REFERRAL SENT TO SCHEDULING

## 2018-10-26 ENCOUNTER — Ambulatory Visit (HOSPITAL_COMMUNITY)
Admission: EM | Admit: 2018-10-26 | Discharge: 2018-10-26 | Disposition: A | Discharge status: Home / Self Care | Payer: Medicare Other | Attending: Urgent Care | Admitting: Urgent Care

## 2018-10-26 ENCOUNTER — Other Ambulatory Visit: Payer: Self-pay

## 2018-10-26 ENCOUNTER — Encounter (HOSPITAL_COMMUNITY): Payer: Self-pay

## 2018-10-26 ENCOUNTER — Telehealth: Payer: Self-pay | Admitting: Internal Medicine

## 2018-10-26 DIAGNOSIS — Z951 Presence of aortocoronary bypass graft: Secondary | ICD-10-CM | POA: Diagnosis present

## 2018-10-26 DIAGNOSIS — J029 Acute pharyngitis, unspecified: Secondary | ICD-10-CM | POA: Insufficient documentation

## 2018-10-26 DIAGNOSIS — I10 Essential (primary) hypertension: Secondary | ICD-10-CM | POA: Insufficient documentation

## 2018-10-26 DIAGNOSIS — E1165 Type 2 diabetes mellitus with hyperglycemia: Secondary | ICD-10-CM | POA: Diagnosis present

## 2018-10-26 LAB — POCT RAPID STREP A: Streptococcus, Group A Screen (Direct): NEGATIVE

## 2018-10-26 MED ORDER — CETIRIZINE HCL 10 MG PO TABS: 10.0000 mg | ORAL_TABLET | Freq: Every day | ORAL | 0 refills | Status: DC

## 2018-10-26 MED ORDER — ACETAMINOPHEN 325 MG PO TABS: 650.0000 mg | ORAL_TABLET | Freq: Once | ORAL | Status: DC

## 2018-10-26 MED ORDER — ACETAMINOPHEN 650 MG/20.3ML PO SOLN: 650.0000 mg | Freq: Three times a day (TID) | ORAL | 0 refills | Status: DC | PRN

## 2018-10-26 MED ORDER — DOXYCYCLINE HYCLATE 100 MG PO TABS: ORAL_TABLET | ORAL | 0 refills | Status: DC

## 2018-10-26 NOTE — Telephone Encounter (Signed)
On call- tearful patient complaining of very bad sore throat, asking abx. Seen at ER today. Strep pending. Zithromax declined because of potential cardiac conduction problem Recognize potential viral- patient in some distress. Symptomatic therapies suggested.  Stay hydrated.  Plan- doxycycline sent to [facility]

## 2018-10-26 NOTE — ED Provider Notes (Signed)
MRN: [id number] DOB: 21-Oct-1945  Subjective:   Sharon Spears is a 73 y.o. female presenting for acute onset of worsening throat pain, had white plaques on her tongue, throat. Now feels some neck discomfort. Has not tried anything for relief.  Patient is requesting azithromycin, states that this was given to her before and she does not want to return to the clinic.  No current facility-administered medications for this encounter.   Current Outpatient Medications:  .  albuterol (PROVENTIL) (2.5 MG/3ML) 0.083% nebulizer solution, Take 3 mLs (2.5 mg total) by nebulization every 6 (six) hours as needed for wheezing or shortness of breath., Disp: 75 mL, Rfl: 12 .  albuterol (VENTOLIN HFA) 108 (90 Base) MCG/ACT inhaler, Inhale 2 puffs into the lungs every 6 (six) hours as needed for wheezing., Disp: 3 Inhaler, Rfl: 1 .  apixaban (ELIQUIS) 5 MG TABS tablet, Take 1 tablet (5 mg total) by mouth 2 (two) times daily., Disp: 60 tablet, Rfl: 3 .  budesonide-formoterol (SYMBICORT) 160-4.5 MCG/ACT inhaler, Inhale 2 puffs into the lungs every 12 (twelve) hours., Disp: 1 Inhaler, Rfl: 5 .  dextromethorphan-guaiFENesin (MUCINEX DM) 30-600 MG 12hr tablet, Take 1 tablet by mouth 2 (two) times daily., Disp: , Rfl:  .  famotidine (PEPCID) 20 MG tablet, Take 20 mg by mouth daily before breakfast. , Disp: , Rfl: 3 .  ferrous sulfate 325 (65 FE) MG tablet, Take 1 tablet (325 mg total) by mouth 3 (three) times daily with meals., Disp: 90 tablet, Rfl: 3 .  fluticasone (FLONASE) 50 MCG/ACT nasal spray, Place 2 sprays into both nostrils 2 (two) times daily., Disp: 1 g, Rfl: 2 .  furosemide (LASIX) 20 MG tablet, Take 1 tablet (20 mg total) by mouth 2 (two) times daily., Disp: , Rfl:  .  isosorbide mononitrate (IMDUR) 30 MG 24 hr tablet, Take 1 tablet by mouth daily., Disp: , Rfl:  .  losartan (COZAAR) 100 MG tablet, Take 1 tablet by mouth daily., Disp: , Rfl:  .  metFORMIN (GLUCOPHAGE XR) 500 MG 24 hr tablet, Take 1 tablet  (500 mg total) by mouth daily with breakfast. (Patient taking differently: Take 500 mg by mouth 2 (two) times daily. ), Disp: 30 tablet, Rfl: 2 .  metoprolol succinate (TOPROL-XL) 50 MG 24 hr tablet, Take 50 mg by mouth daily. Take with or immediately following a meal., Disp: , Rfl:  .  montelukast (SINGULAIR) 10 MG tablet, Take 1 tablet (10 mg total) by mouth at bedtime., Disp: 30 tablet, Rfl: 11 .  Multiple Vitamin (MULTIVITAMIN WITH MINERALS) TABS tablet, Take 1 tablet by mouth daily., Disp: 30 tablet, Rfl: 3 .  nitroGLYCERIN (NITROSTAT) 0.4 MG SL tablet, Place 0.4 mg under the tongue every 5 (five) minutes as needed for chest pain. , Disp: , Rfl:  .  zolpidem (AMBIEN) 5 MG tablet, Take 1 tablet by mouth at bedtime as needed., Disp: , Rfl:     Allergies  Allergen Reactions  . Contrast Media [Iodinated Diagnostic Agents] Anaphylaxis    Per pt she had anaphylaxis reaction to contrast media in the past. States she couldn't breathe and they had to give her epi.   . Adhesive [Tape] Itching  . Penicillins Other (See Comments)    GI upset Has patient had a PCN reaction causing immediate rash, facial/tongue/throat swelling, SOB or lightheadedness with hypotension: No Has patient had a PCN reaction causing severe rash involving mucus membranes or skin necrosis: No Has patient had a PCN reaction that required hospitalization: No Has  patient had a PCN reaction occurring within the last 10 years: No If all of the above answers are "NO", then may proceed with Cephalosporin use.    Past Medical History:  Diagnosis Date  . Adjustment disorder with anxiety   . Asthma   . Atrial fibrillation ([place])   . Benign essential hypertension   . Coronary artery disease   . Diabetic neuropathic arthritis ([place])   . Heart palpitations   . Hypoxia 07/19/2018  . Other cirrhosis of liver ([place]) 07/20/2018  . Uncontrolled diabetes mellitus type 2 without complications      Past Surgical History:  Procedure  Laterality Date  . CARDIAC SURGERY    . CESAREAN SECTION    . CORONARY ARTERY BYPASS GRAFT    . ESOPHAGEAL MANOMETRY N/A 03/06/2012   Procedure: ESOPHAGEAL MANOMETRY (EM);  Surgeon: Beryle Beams, MD;  Location: [facility];  Service: Endoscopy;  Laterality: N/A;  . TONSILLECTOMY      Review of Systems  Constitutional: Negative for fever and malaise/fatigue.  HENT: Positive for sore throat. Negative for congestion, ear pain and sinus pain.   Eyes: Negative for discharge and redness.  Respiratory: Negative for cough, hemoptysis, shortness of breath and wheezing.   Cardiovascular: Negative for chest pain.  Gastrointestinal: Negative for abdominal pain, diarrhea, nausea and vomiting.  Genitourinary: Negative for dysuria, flank pain and hematuria.  Musculoskeletal: Positive for neck pain. Negative for myalgias.  Skin: Negative for rash.  Neurological: Negative for dizziness, weakness and headaches.  Psychiatric/Behavioral: Negative for depression and substance abuse.    Social History   Tobacco Use  . Smoking status: Never Smoker  . Smokeless tobacco: Never Used  Substance Use Topics  . Alcohol use: No  . Drug use: Never     Objective:   Vitals: BP (!) 173/61 (BP Location: Left Arm)   Pulse 90   Temp 98 F (36.7 C) (Temporal)   Resp 20   SpO2 98%   BP Readings from Last 3 Encounters:  10/26/18 (!) 173/61  10/02/18 (!) 173/78  09/05/18 124/78   BP was 163/62 on recheck by PA [facility] for left arm, sitting position, 15:10.  Physical Exam Constitutional:      General: She is not in acute distress.    Appearance: Normal appearance. She is well-developed. She is not ill-appearing.  HENT:     Head: Normocephalic and atraumatic.     Right Ear: Tympanic membrane and ear canal normal. No drainage or tenderness. No middle ear effusion. Tympanic membrane is not erythematous.     Left Ear: Tympanic membrane and ear canal normal. No drainage or tenderness.  No middle ear  effusion. Tympanic membrane is not erythematous.     Nose: Nose normal. No congestion or rhinorrhea.     Mouth/Throat:     Mouth: Mucous membranes are moist. No oral lesions.     Pharynx: No pharyngeal swelling, oropharyngeal exudate, posterior oropharyngeal erythema or uvula swelling.     Tonsils: No tonsillar exudate or tonsillar abscesses.     Comments: Significant postnasal drainage. Eyes:     General: No scleral icterus.    Extraocular Movements: Extraocular movements intact.     Right eye: Normal extraocular motion.     Left eye: Normal extraocular motion.     Conjunctiva/sclera: Conjunctivae normal.     Pupils: Pupils are equal, round, and reactive to light.  Neck:     Musculoskeletal: Normal range of motion and neck supple.  Cardiovascular:     Rate  and Rhythm: Normal rate.  Pulmonary:     Effort: Pulmonary effort is normal.  Lymphadenopathy:     Cervical: No cervical adenopathy.  Skin:    General: Skin is warm and dry.  Neurological:     General: No focal deficit present.     Mental Status: She is alert and oriented to person, place, and time.  Psychiatric:        Mood and Affect: Mood normal.        Behavior: Behavior normal.    Results for orders placed or performed during the hospital encounter of 10/26/18 (from the past 24 hour(s))  POCT rapid strep A [facility])     Status: None   Collection Time: 10/26/18  3:14 PM  Result Value Ref Range   Streptococcus, Group A Screen (Direct) NEGATIVE NEGATIVE    Assessment and Plan :   1. Sore throat   2. Essential hypertension   3. Elevated blood pressure reading in office with diagnosis of hypertension   4. Uncontrolled type 2 diabetes mellitus with hyperglycemia ([place])   5. S/P CABG (coronary artery bypass graft)     Recommended supportive care to patient.  Strep culture is pending.  She was given Tylenol in clinic.  Also recommended that she start cetirizine to help with any postnasal drainage is causing her  throat pain.  I counseled against azithromycin given potential for QT prolongation, other side effects and discussed medical indications of using antibiotics.  Patient requested a antibiotic injection in clinic which I also refused given her allergy to penicillin.  Counseled patient on potential for adverse effects with medications prescribed/recommended today, ER and return-to-clinic precautions discussed, patient verbalized understanding.    Jaynee Eagles, PA-C 10/26/18 1531

## 2018-10-26 NOTE — Discharge Instructions (Addendum)
Para el dolor de garganta o tos intente usar un t a base de miel. Use 3 cucharaditas de miel con jugo exprimido de [employer]. Coloque trozos de Pension scheme manager en 1 / 2-1 taza de agua y caliente sobre la estufa. Luego mezcle los ingredientes y repita cada 4 horas segn sea necesario. Tome Tylenol 500 mg cada 6 horas. Hidrata muy bien con al menos 2 litros de [place]. Coma comidas ligeras como sopas para [employer] electrolitos y coma frutas suaves, verduras. Comience un antihistamnico como Zyrtec, Allegra o Claritin. Puede recoger pseudoefedrina de venta libre (Sudafed) y usarla para el drenaje posnasal, la congestin nasal a una dosis de 60 mg cada 8 horas o cada 12 horas cuando lo ocupe.

## 2018-10-26 NOTE — Telephone Encounter (Signed)
Error duplication

## 2018-10-26 NOTE — ED Triage Notes (Signed)
Patient presents to Urgent Care with complaints of sore throat since a few days ago. Patient reports she has been gargling with salt water, back of throat had white patches this morning.

## 2018-10-29 LAB — CULTURE, GROUP A STREP (THRC)

## 2018-10-30 ENCOUNTER — Emergency Department (HOSPITAL_COMMUNITY)
Admission: EM | Admit: 2018-10-30 | Discharge: 2018-10-31 | Disposition: A | Discharge status: Home / Self Care | Payer: Medicare Other | Attending: Emergency Medicine | Admitting: Emergency Medicine

## 2018-10-30 ENCOUNTER — Other Ambulatory Visit: Payer: Self-pay

## 2018-10-30 ENCOUNTER — Emergency Department (HOSPITAL_COMMUNITY): Payer: Medicare Other

## 2018-10-30 ENCOUNTER — Encounter (HOSPITAL_COMMUNITY): Payer: Self-pay

## 2018-10-30 DIAGNOSIS — R519 Headache, unspecified: Secondary | ICD-10-CM | POA: Insufficient documentation

## 2018-10-30 DIAGNOSIS — Z7984 Long term (current) use of oral hypoglycemic drugs: Secondary | ICD-10-CM | POA: Insufficient documentation

## 2018-10-30 DIAGNOSIS — I10 Essential (primary) hypertension: Secondary | ICD-10-CM | POA: Diagnosis not present

## 2018-10-30 DIAGNOSIS — E119 Type 2 diabetes mellitus without complications: Secondary | ICD-10-CM | POA: Diagnosis not present

## 2018-10-30 DIAGNOSIS — Z79899 Other long term (current) drug therapy: Secondary | ICD-10-CM | POA: Diagnosis not present

## 2018-10-30 DIAGNOSIS — Z20822 Contact with and (suspected) exposure to covid-19: Secondary | ICD-10-CM

## 2018-10-30 DIAGNOSIS — Z951 Presence of aortocoronary bypass graft: Secondary | ICD-10-CM | POA: Diagnosis not present

## 2018-10-30 DIAGNOSIS — J988 Other specified respiratory disorders: Secondary | ICD-10-CM | POA: Diagnosis not present

## 2018-10-30 DIAGNOSIS — Z7901 Long term (current) use of anticoagulants: Secondary | ICD-10-CM | POA: Insufficient documentation

## 2018-10-30 DIAGNOSIS — R0602 Shortness of breath: Secondary | ICD-10-CM | POA: Diagnosis not present

## 2018-10-30 DIAGNOSIS — I259 Chronic ischemic heart disease, unspecified: Secondary | ICD-10-CM | POA: Insufficient documentation

## 2018-10-30 DIAGNOSIS — Z20828 Contact with and (suspected) exposure to other viral communicable diseases: Secondary | ICD-10-CM | POA: Insufficient documentation

## 2018-10-30 DIAGNOSIS — J069 Acute upper respiratory infection, unspecified: Secondary | ICD-10-CM

## 2018-10-30 DIAGNOSIS — R0789 Other chest pain: Secondary | ICD-10-CM | POA: Diagnosis present

## 2018-10-30 DIAGNOSIS — I4891 Unspecified atrial fibrillation: Secondary | ICD-10-CM | POA: Insufficient documentation

## 2018-10-30 LAB — TROPONIN I (HIGH SENSITIVITY): Troponin I (High Sensitivity): 13 ng/L (ref <18)

## 2018-10-30 LAB — BASIC METABOLIC PANEL
Anion gap: 11 (ref 5–15)
BUN: 22 mg/dL (ref 8–23)
CO2: 20 mmol/L — ABNORMAL LOW (ref 22–32)
Calcium: 8.6 mg/dL — ABNORMAL LOW (ref 8.9–10.3)
Chloride: 101 mmol/L (ref 98–111)
Creatinine, Ser: 0.79 mg/dL (ref 0.44–1.00)
GFR calc Af Amer: >60 mL/min (ref >60)
GFR calc non Af Amer: >60 mL/min (ref >60)
Glucose, Bld: 132 mg/dL — ABNORMAL HIGH (ref 70–99)
Potassium: 5.3 mmol/L — ABNORMAL HIGH (ref 3.5–5.1)
Sodium: 132 mmol/L — ABNORMAL LOW (ref 135–145)

## 2018-10-30 LAB — CBC
HCT: 33.5 % — ABNORMAL LOW (ref 36.0–46.0)
Hemoglobin: 10.4 g/dL — ABNORMAL LOW (ref 12.0–15.0)
MCH: 25 pg — ABNORMAL LOW (ref 26.0–34.0)
MCHC: 31 g/dL (ref 30.0–36.0)
MCV: 80.5 fL (ref 80.0–100.0)
Platelets: 230 10*3/uL (ref 150–400)
RBC: 4.16 MIL/uL (ref 3.87–5.11)
RDW: 17.4 % — ABNORMAL HIGH (ref 11.5–15.5)
WBC: 8.2 10*3/uL (ref 4.0–10.5)
nRBC: 0 % (ref 0.0–0.2)

## 2018-10-30 MED ORDER — ACETAMINOPHEN 325 MG PO TABS
650.0000 mg | ORAL_TABLET | Freq: Once | ORAL | Status: AC
  Administered 2018-10-31: 650 mg via ORAL
  Filled 2018-10-30: qty 2

## 2018-10-30 MED ORDER — SODIUM CHLORIDE 0.9 % IV BOLUS
1000.0000 mL | Freq: Once | INTRAVENOUS | Status: AC
  Administered 2018-10-31: 01:00:00 1000 mL via INTRAVENOUS

## 2018-10-30 MED ORDER — SODIUM CHLORIDE 0.9% FLUSH: 3.0000 mL | Freq: Once | INTRAVENOUS | Status: DC

## 2018-10-30 MED ORDER — PROCHLORPERAZINE EDISYLATE 10 MG/2ML IJ SOLN
5.0000 mg | Freq: Once | INTRAMUSCULAR | Status: AC
  Administered 2018-10-31: 01:00:00 5 mg via INTRAVENOUS
  Filled 2018-10-30: qty 2

## 2018-10-30 MED ORDER — DIPHENHYDRAMINE HCL 50 MG/ML IJ SOLN
12.5000 mg | Freq: Once | INTRAMUSCULAR | Status: AC
  Administered 2018-10-31: 12.5 mg via INTRAVENOUS
  Filled 2018-10-30: qty 1

## 2018-10-30 NOTE — ED Notes (Signed)
Pt taking her own tramadol

## 2018-10-30 NOTE — ED Triage Notes (Signed)
Pt reports chest pain, sob and headache for the past 2 days, pt took 1 nitro last night and 1 nitro today without relief of pain. Pt a.o, nad noted.

## 2018-10-30 NOTE — ED Provider Notes (Signed)
[place] EMERGENCY DEPARTMENT Provider Note   CSN: [id number] Arrival date & time: 10/30/18  6063     History   Chief Complaint Chief Complaint  Patient presents with  . Chest Pain  . Shortness of Breath  . Headache    HPI Sharon Spears is a 73 y.o. female.     HPI  This is a 73 year old female with a history of atrial fibrillation, coronary artery disease, diabetes who presents with headache, sore throat, chest pain.  Patient reports she has had ongoing symptoms for the last 3 to 4 days.  She was seen in urgent care on the 15th for likely viral pharyngitis.  Strep culture at that time was negative.  She states she has had continued sore throat worse with swallowing.  She also reports left-sided chest pain that feels like "pressure and tingling."  At times it is worse with breathing and coughing.  She also reports a 10 out of 10 headache.  Mostly posterior.  Denies neck stiffness.  Has not noted any fevers at home but noted to have a temperature here of 100.9.  Reports that she has been social distancing and wearing a mask.  No known sick contacts.  No loss of sense of taste or smell.  Patient has not taken anything for her symptoms.  Past Medical History:  Diagnosis Date  . Adjustment disorder with anxiety   . Asthma   . Atrial fibrillation ([place])   . Benign essential hypertension   . Coronary artery disease   . Diabetic neuropathic arthritis ([place])   . Heart palpitations   . Hypoxia 07/19/2018  . Other cirrhosis of liver ([place]) 07/20/2018  . Uncontrolled diabetes mellitus type 2 without complications     Patient Active Problem List   Diagnosis Date Noted  . Other cirrhosis of liver ([place]) 07/20/2018  . Unspecified atrial fibrillation ([place]) 07/20/2018  . Acute respiratory failure with hypoxemia ([place]) 07/19/2018  . Asthma 06/12/2018  . Cough 02/08/2018  . Acute on chronic left systolic heart failure ([place]) 09/25/2017  . Thrombocytopenia ([place]) 03/20/2015   . Diabetes mellitus type 2, controlled ([place]) 03/20/2015  . HTN (hypertension) 03/20/2015  . S/P CABG (coronary artery bypass graft) 03/20/2015    Past Surgical History:  Procedure Laterality Date  . CARDIAC SURGERY    . CESAREAN SECTION    . CORONARY ARTERY BYPASS GRAFT    . ESOPHAGEAL MANOMETRY N/A 03/06/2012   Procedure: ESOPHAGEAL MANOMETRY (EM);  Surgeon: Beryle Beams, MD;  Location: [facility];  Service: Endoscopy;  Laterality: N/A;  . TONSILLECTOMY       OB History   No obstetric history on file.      Home Medications    Prior to Admission medications   Medication Sig Start Date End Date Taking? Authorizing Provider  acetaminophen (TYLENOL) 325 MG tablet Take 2 tablets (650 mg total) by mouth every 6 (six) hours as needed for fever or headache. 10/31/18   Irelynn Schermerhorn, Barbette Hair, MD  Acetaminophen 650 MG/20.3ML SOLN Take 20.3 mLs (650 mg total) by mouth 3 (three) times daily as needed. 10/26/18   Jaynee Eagles, PA-C  albuterol (PROVENTIL) (2.5 MG/3ML) 0.083% nebulizer solution Take 3 mLs (2.5 mg total) by nebulization every 6 (six) hours as needed for wheezing or shortness of breath. 06/12/18   Fenton Foy, NP  albuterol (VENTOLIN HFA) 108 (90 Base) MCG/ACT inhaler Inhale 2 puffs into the lungs every 6 (six) hours as needed for wheezing. 06/12/18   Nils Pyle,  Kriste Basque, NP  apixaban (ELIQUIS) 5 MG TABS tablet Take 1 tablet (5 mg total) by mouth 2 (two) times daily. 09/28/17   Charolette Forward, MD  budesonide-formoterol (SYMBICORT) 160-4.5 MCG/ACT inhaler Inhale 2 puffs into the lungs every 12 (twelve) hours. 09/26/18   Icard, Octavio Graves, DO  cetirizine (ZYRTEC) 10 MG tablet Take 1 tablet (10 mg total) by mouth daily. 10/26/18   Jaynee Eagles, PA-C  dextromethorphan-guaiFENesin [facility] DM) 30-600 MG 12hr tablet Take 1 tablet by mouth 2 (two) times daily.    [provider]  doxycycline (VIBRA-TABS) 100 MG tablet 2 today then one daily 10/26/18   Baird Lyons D, MD  famotidine  (PEPCID) 20 MG tablet Take 20 mg by mouth daily before breakfast.  02/04/15   [provider]  ferrous sulfate 325 (65 FE) MG tablet Take 1 tablet (325 mg total) by mouth 3 (three) times daily with meals. 09/28/17   Charolette Forward, MD  fluticasone (FLONASE) 50 MCG/ACT nasal spray Place 2 sprays into both nostrils 2 (two) times daily. 05/04/18   Lamptey, Myrene Galas, MD  furosemide (LASIX) 20 MG tablet Take 1 tablet (20 mg total) by mouth 2 (two) times daily. 07/21/18   Samuella Cota, MD  isosorbide mononitrate (IMDUR) 30 MG 24 hr tablet Take 1 tablet by mouth daily. 07/12/18   [provider]  losartan (COZAAR) 100 MG tablet Take 1 tablet by mouth daily. 08/16/18   [provider]  metFORMIN (GLUCOPHAGE XR) 500 MG 24 hr tablet Take 1 tablet (500 mg total) by mouth daily with breakfast. Patient taking differently: Take 500 mg by mouth 2 (two) times daily.  06/02/16   Golden Circle, FNP  metoprolol succinate (TOPROL-XL) 50 MG 24 hr tablet Take 50 mg by mouth daily. Take with or immediately following a meal.    [provider]  montelukast (SINGULAIR) 10 MG tablet Take 1 tablet (10 mg total) by mouth at bedtime. 06/12/18   Fenton Foy, NP  Multiple Vitamin (MULTIVITAMIN WITH MINERALS) TABS tablet Take 1 tablet by mouth daily. 09/28/17   Charolette Forward, MD  nitroGLYCERIN (NITROSTAT) 0.4 MG SL tablet Place 0.4 mg under the tongue every 5 (five) minutes as needed for chest pain.     [provider]  zolpidem (AMBIEN) 5 MG tablet Take 1 tablet by mouth at bedtime as needed. 08/16/18   [provider]    Family History Family History  Problem Relation Age of Onset  . Asthma Mother   . Alcohol abuse Father     Social History Social History   Tobacco Use  . Smoking status: Never Smoker  . Smokeless tobacco: Never Used  Substance Use Topics  . Alcohol use: No  . Drug use: Never     Allergies   Contrast media [iodinated diagnostic agents],  Adhesive [tape], and Penicillins   Review of Systems Review of Systems  Constitutional: Positive for chills and fever.  HENT: Positive for trouble swallowing. Negative for ear pain.   Respiratory: Negative for shortness of breath.   Cardiovascular: Positive for chest pain.  Gastrointestinal: Positive for nausea. Negative for abdominal pain, diarrhea and vomiting.  Genitourinary: Negative for dysuria.  Musculoskeletal: Negative for back pain.  Skin: Negative for rash.  Neurological: Positive for headaches.  All other systems reviewed and are negative.    Physical Exam Updated Vital Signs BP (!) 117/46   Pulse 81   Temp (!) 100.9 F (38.3 C) (Oral)   Resp (!) 22  Ht 1.524 m (5')   Wt 64.4 kg   SpO2 95%   BMI 27.73 kg/m   Physical Exam Vitals signs and nursing note reviewed.  Constitutional:      Appearance: She is well-developed. She is not ill-appearing.  HENT:     Head: Normocephalic and atraumatic.     Mouth/Throat:     Mouth: Mucous membranes are moist.     Comments: Palatal petechiae noted, symmetric tonsillar enlargement bilaterally, exudate noted Eyes:     Pupils: Pupils are equal, round, and reactive to light.  Neck:     Musculoskeletal: Neck supple.     Comments: No meningismus Cardiovascular:     Rate and Rhythm: Normal rate and regular rhythm.     Heart sounds: Normal heart sounds.  Pulmonary:     Effort: Pulmonary effort is normal. No respiratory distress.     Breath sounds: No wheezing.     Comments: Tenderness to palpation over the left chest wall, no overlying skin changes or crepitus Chest:     Chest wall: Tenderness present.  Abdominal:     General: Bowel sounds are normal.     Palpations: Abdomen is soft.  Musculoskeletal:     Right lower leg: No edema.     Left lower leg: No edema.  Skin:    General: Skin is warm and dry.  Neurological:     Mental Status: She is alert and oriented to person, place, and time.  Psychiatric:        Mood  and Affect: Mood normal.      ED Treatments / Results  Labs (all labs ordered are listed, but only abnormal results are displayed) Labs Reviewed  BASIC METABOLIC PANEL - Abnormal; Notable for the following components:      Result Value   Sodium 132 (*)    Potassium 5.3 (*)    CO2 20 (*)    Glucose, Bld 132 (*)    Calcium 8.6 (*)    All other components within normal limits  CBC - Abnormal; Notable for the following components:   Hemoglobin 10.4 (*)    HCT 33.5 (*)    MCH 25.0 (*)    RDW 17.4 (*)    All other components within normal limits  URINALYSIS, ROUTINE W REFLEX MICROSCOPIC - Abnormal; Notable for the following components:   Color, Urine COLORLESS (*)    Hgb urine dipstick SMALL (*)    All other components within normal limits  GROUP A STREP BY PCR  SARS CORONAVIRUS 2 (TAT 6-24 HRS)  TROPONIN I (HIGH SENSITIVITY)  TROPONIN I (HIGH SENSITIVITY)    EKG EKG Interpretation  Date/Time:  Monday October 30 2018 19:03:35 EDT Ventricular Rate:  80 PR Interval:    QRS Duration: 92 QT Interval:  376 QTC Calculation: 433 R Axis:   90 Text Interpretation:  Atrial fibrillation with premature ventricular or aberrantly conducted complexes Rightward axis Nonspecific ST abnormality Abnormal ECG Confirmed by Thayer Jew [phone]) on 10/30/2018 11:05:19 PM   Radiology Dg Chest 2 View  Result Date: 10/30/2018 CLINICAL DATA:  Chest pain, shortness of breath, and fever for 2 days. Hypertension. Coronary artery disease. EXAM: CHEST - 2 VIEW COMPARISON:  10/02/2018 and 01/21/2018 FINDINGS: Stable mild cardiomegaly. Prior CABG and mitral valve repair noted. Left basilar pleural-parenchymal scarring is stable. Scarring in right lung base is also unchanged. No evidence of acute pulmonary infiltrate or edema. IMPRESSION: Stable mild cardiomegaly and bibasilar scarring. No acute findings. Electronically Signed   By:  Marlaine Hind M.D.   On: 10/30/2018 19:40    Procedures Procedures  (including critical care time)  Medications Ordered in ED Medications  sodium chloride flush (NS) 0.9 % injection 3 mL (3 mLs Intravenous Not Given 10/31/18 0041)  acetaminophen (TYLENOL) tablet 650 mg (650 mg Oral Given 10/31/18 0025)  prochlorperazine (COMPAZINE) injection 5 mg (5 mg Intravenous Given 10/31/18 0041)  diphenhydrAMINE (BENADRYL) injection 12.5 mg (12.5 mg Intravenous Given 10/31/18 0040)  sodium chloride 0.9 % bolus 1,000 mL (1,000 mLs Intravenous New Bag/Given 10/31/18 0046)     Initial Impression / Assessment and Plan / ED Course  I have reviewed the triage vital signs and the nursing notes.  Pertinent labs & imaging results that were available during my care of the patient were reviewed by me and considered in my medical decision making (see chart for details).        Patient presents with headache, sore throat, chest pain, upper respiratory symptoms.  She is febrile but overall nontoxic-appearing.  She has some palatal petechiae.  Recent strep testing at urgent care negative.  Patient was given a migraine cocktail.  EKG shows no evidence of ischemia and troponin x2 are negative.  Doubt ACS.  Chest x-ray without evidence of pneumonia and breath sounds are clear.  Lab work-up with mild hyponatremia.  There is also some hyperkalemia but they noted hemolysis.  Otherwise lab work is reassuring.  Patient much improved on recheck after treatment.  Suspect upper respiratory symptoms.  She was tested for Covid and that is pending.  We discussed isolation.  After history, exam, and medical workup I feel the patient has been appropriately medically screened and is safe for discharge home. Pertinent diagnoses were discussed with the patient. Patient was given return precautions.  Delainey Winstanley was evaluated in Emergency Department on 10/31/2018 for the symptoms described in the history of present illness. She was evaluated in the context of the global COVID-19 pandemic, which  necessitated consideration that the patient might be at risk for infection with the SARS-CoV-2 virus that causes COVID-19. Institutional protocols and algorithms that pertain to the evaluation of patients at risk for COVID-19 are in a state of rapid change based on information released by regulatory bodies including the CDC and federal and state organizations. These policies and algorithms were followed during the patient's care in the ED.  Final Clinical Impressions(s) / ED Diagnoses   Final diagnoses:  Viral URI  Encounter for laboratory testing for COVID-19 virus    ED Discharge Orders         Ordered    acetaminophen (TYLENOL) 325 MG tablet  Every 6 hours PRN     10/31/18 0314           Merryl Hacker, MD 10/31/18 [phone]

## 2018-10-30 NOTE — ED Notes (Signed)
Pt states she won't let me take vitals "tell the doctor to call me"

## 2018-10-31 ENCOUNTER — Ambulatory Visit: Payer: Medicare Other | Admitting: Pulmonary Disease

## 2018-10-31 LAB — URINALYSIS, ROUTINE W REFLEX MICROSCOPIC
Bacteria, UA: NONE SEEN
Bilirubin Urine: NEGATIVE
Glucose, UA: NEGATIVE mg/dL
Ketones, ur: NEGATIVE mg/dL
Leukocytes,Ua: NEGATIVE
Nitrite: NEGATIVE
Protein, ur: NEGATIVE mg/dL
Specific Gravity, Urine: 1.005 (ref 1.005–1.030)
pH: 5 (ref 5.0–8.0)

## 2018-10-31 LAB — SARS CORONAVIRUS 2 (TAT 6-24 HRS): SARS Coronavirus 2: NEGATIVE

## 2018-10-31 LAB — TROPONIN I (HIGH SENSITIVITY): Troponin I (High Sensitivity): 14 ng/L (ref <18)

## 2018-10-31 LAB — GROUP A STREP BY PCR: Group A Strep by PCR: NOT DETECTED

## 2018-10-31 MED ORDER — ACETAMINOPHEN 325 MG PO TABS: 650.0000 mg | ORAL_TABLET | Freq: Four times a day (QID) | ORAL | 0 refills | Status: DC | PRN

## 2018-10-31 NOTE — Discharge Instructions (Addendum)
You were seen today for sore throat, headache, chest pain.  Your work-up is largely reassuring.  You likely have a viral upper respiratory infection.  Covid testing is pending.  You need to isolate until Covid testing returns.  Take Tylenol or Motrin for body aches and pains or fevers.

## 2018-11-01 ENCOUNTER — Encounter: Payer: Self-pay | Admitting: Pulmonary Disease

## 2018-11-01 ENCOUNTER — Ambulatory Visit (INDEPENDENT_AMBULATORY_CARE_PROVIDER_SITE_OTHER): Payer: Medicare Other | Admitting: Pulmonary Disease

## 2018-11-01 ENCOUNTER — Other Ambulatory Visit: Payer: Self-pay

## 2018-11-01 VITALS — BP 122/80 | HR 73 | Temp 98.0°F | Ht 60.0 in | Wt 146.0 lb

## 2018-11-01 DIAGNOSIS — R0602 Shortness of breath: Secondary | ICD-10-CM | POA: Diagnosis not present

## 2018-11-01 DIAGNOSIS — Z7722 Contact with and (suspected) exposure to environmental tobacco smoke (acute) (chronic): Secondary | ICD-10-CM | POA: Diagnosis not present

## 2018-11-01 DIAGNOSIS — J4540 Moderate persistent asthma, uncomplicated: Secondary | ICD-10-CM

## 2018-11-01 DIAGNOSIS — I5023 Acute on chronic systolic (congestive) heart failure: Secondary | ICD-10-CM | POA: Diagnosis not present

## 2018-11-01 NOTE — Progress Notes (Signed)
Synopsis: Referred in Jan. 2020 for coughing, pain, headache by Charolette Forward, MD  Subjective:   PATIENT ID: Sharon Spears GENDER: female DOB: 1945-02-15, MRN: [id number]  Chief Complaint  Patient presents with   Follow-up    Recent visit in ED for SOB and headache. Covid negative yesterday. States the SOB continues.     This is a 73 year old with complaints of fever and body aches.  She was recently treated with an upper respiratory tract infection approximately month ago.  She was seen in the emergency room.  She was referred to the pulmonologist office for respiratory complaints following this.  She does have known congestive heart failure.  She is a lifelong non-smoker.  But she did spend nearly 50 years in [place].  She is concerned for air pollution and smog affect to her pulmonary function.  Over the past several weeks she has had progressive dyspnea.  She states that she has been compliant with her congestive heart failure regimen.  She has noticed some mild increase in the swelling of her legs.  She recently followed up with her cardiologist last week.  She does have cough that is productive at times, chest tightness, denies hemoptysis.  OV 09/05/2018: Since last office visit patient has had multiple episodes of recurrent cough and congestion.  She has been seen by 1 of our nurse practitioners in March, ER April x2, office June, ER July for all similar symptoms.  Called into the office again on August 21 with complaints of congestion.  Chest imaging in June stable consistent with prior CABG and sternotomy. Chest x-ray in July with basilar atelectasis in the left pleural effusion.  Echocardiogram in July 2020 with a depressed ejection fraction 45 to 50%.  Left ventricular hypokinesis. She wakes up every night with coughing symptoms. At night time she had worse symptoms.  He has ongoing nocturnal wheezing.  She always feels like she has a tickle in her throat.  During the daytime is  better.  Nighttime is much worse.  She will occasionally steam a pot of hot water and breathe in steam in the middle of the night which helps soothe her coughing spells.  OV 11/01/2018: Patient last seen in the office in August.  Unfortunately recently had an exacerbation requiring antibiotics.  Possible viral URI symptoms.  She was treated with antibiotics.  She also had a migraine was seen in the emergency room this was also treated.  She was Covid tested which was negative.  She is followed by cardiology for chronic systolic heart failure.  She takes her medications regularly.  Has a history of asthma.  Uses her Symbicort daily.  Her symptoms at this point are better.  We reviewed her pulmonary function test that were completed back in January.  We looked at her chest x-ray that was recently completed in the ER.  She still has some left-sided elevated hemidiaphragm plus scarring in the left base.  History of CABG.     Past Medical History:  Diagnosis Date   Adjustment disorder with anxiety    Asthma    Atrial fibrillation (HCC)    Benign essential hypertension    Coronary artery disease    Diabetic neuropathic arthritis ([place])    Heart palpitations    Hypoxia 07/19/2018   Other cirrhosis of liver ([place]) 07/20/2018   Uncontrolled diabetes mellitus type 2 without complications      Family History  Problem Relation Age of Onset   Asthma Mother  Alcohol abuse Father      Past Surgical History:  Procedure Laterality Date   CARDIAC SURGERY     CESAREAN SECTION     CORONARY ARTERY BYPASS GRAFT     ESOPHAGEAL MANOMETRY N/A 03/06/2012   Procedure: ESOPHAGEAL MANOMETRY (EM);  Surgeon: Beryle Beams, MD;  Location: [facility];  Service: Endoscopy;  Laterality: N/A;   TONSILLECTOMY      Social History   Socioeconomic History   Marital status: Legally Separated    Spouse name: Not on file   Number of children: 4   Years of education: 15   Highest education  level: Not on file  Occupational History   Occupation: Retired  Scientist, product/process development strain: Not on file   Food insecurity    Worry: Not on file    Inability: Not on Lexicographer needs    Medical: Not on file    Non-medical: Not on file  Tobacco Use   Smoking status: Never Smoker   Smokeless tobacco: Never Used  Substance and Sexual Activity   Alcohol use: No   Drug use: Never   Sexual activity: Yes  Lifestyle   Physical activity    Days per week: Not on file    Minutes per session: Not on file   Stress: Not on file  Relationships   Social connections    Talks on phone: Not on file    Gets together: Not on file    Attends religious service: Not on file    Active member of club or organization: Not on file    Attends meetings of clubs or organizations: Not on file    Relationship status: Not on file   Intimate partner violence    Fear of current or ex partner: Not on file    Emotionally abused: Not on file    Physically abused: Not on file    Forced sexual activity: Not on file  Other Topics Concern   Not on file  Social History Narrative   Fun/Hobby: Gardening    Denies abuse and feels safe at home.      Allergies  Allergen Reactions   Contrast Media [Iodinated Diagnostic Agents] Anaphylaxis    Per pt she had anaphylaxis reaction to contrast media in the past. States she couldn't breathe and they had to give her epi.    Adhesive [Tape] Itching   Penicillins Other (See Comments)    GI upset Has patient had a PCN reaction causing immediate rash, facial/tongue/throat swelling, SOB or lightheadedness with hypotension: No Has patient had a PCN reaction causing severe rash involving mucus membranes or skin necrosis: No Has patient had a PCN reaction that required hospitalization: No Has patient had a PCN reaction occurring within the last 10 years: No If all of the above answers are "NO", then may proceed with Cephalosporin use.       Outpatient Medications Prior to Visit  Medication Sig Dispense Refill   acetaminophen (TYLENOL) 325 MG tablet Take 2 tablets (650 mg total) by mouth every 6 (six) hours as needed for fever or headache. 30 tablet 0   Acetaminophen 650 MG/20.3ML SOLN Take 20.3 mLs (650 mg total) by mouth 3 (three) times daily as needed. 400 mL 0   albuterol (PROVENTIL) (2.5 MG/3ML) 0.083% nebulizer solution Take 3 mLs (2.5 mg total) by nebulization every 6 (six) hours as needed for wheezing or shortness of breath. 75 mL 12   albuterol (VENTOLIN HFA) 108 (90  Base) MCG/ACT inhaler Inhale 2 puffs into the lungs every 6 (six) hours as needed for wheezing. 3 Inhaler 1   apixaban (ELIQUIS) 5 MG TABS tablet Take 1 tablet (5 mg total) by mouth 2 (two) times daily. 60 tablet 3   budesonide-formoterol (SYMBICORT) 160-4.5 MCG/ACT inhaler Inhale 2 puffs into the lungs every 12 (twelve) hours. 1 Inhaler 5   cetirizine (ZYRTEC) 10 MG tablet Take 1 tablet (10 mg total) by mouth daily. 30 tablet 0   dextromethorphan-guaiFENesin (MUCINEX DM) 30-600 MG 12hr tablet Take 1 tablet by mouth 2 (two) times daily.     famotidine (PEPCID) 20 MG tablet Take 20 mg by mouth daily before breakfast.   3   ferrous sulfate 325 (65 FE) MG tablet Take 1 tablet (325 mg total) by mouth 3 (three) times daily with meals. 90 tablet 3   fluticasone (FLONASE) 50 MCG/ACT nasal spray Place 2 sprays into both nostrils 2 (two) times daily. 1 g 2   furosemide (LASIX) 20 MG tablet Take 1 tablet (20 mg total) by mouth 2 (two) times daily.     isosorbide mononitrate (IMDUR) 30 MG 24 hr tablet Take 1 tablet by mouth daily.     losartan (COZAAR) 100 MG tablet Take 1 tablet by mouth daily.     metFORMIN (GLUCOPHAGE XR) 500 MG 24 hr tablet Take 1 tablet (500 mg total) by mouth daily with breakfast. (Patient taking differently: Take 500 mg by mouth 2 (two) times daily. ) 30 tablet 2   metoprolol succinate (TOPROL-XL) 50 MG 24 hr tablet Take 50 mg by  mouth daily. Take with or immediately following a meal.     montelukast (SINGULAIR) 10 MG tablet Take 1 tablet (10 mg total) by mouth at bedtime. 30 tablet 11   Multiple Vitamin (MULTIVITAMIN WITH MINERALS) TABS tablet Take 1 tablet by mouth daily. 30 tablet 3   nitroGLYCERIN (NITROSTAT) 0.4 MG SL tablet Place 0.4 mg under the tongue every 5 (five) minutes as needed for chest pain.      zolpidem (AMBIEN) 5 MG tablet Take 1 tablet by mouth at bedtime as needed.     doxycycline (VIBRA-TABS) 100 MG tablet 2 today then one daily (Patient not taking: Reported on 11/01/2018) 8 tablet 0   No facility-administered medications prior to visit.     Review of Systems  Constitutional: Negative for chills, fever, malaise/fatigue and weight loss.  HENT: Negative for hearing loss, sore throat and tinnitus.   Eyes: Negative for blurred vision and double vision.  Respiratory: Positive for shortness of breath. Negative for cough, hemoptysis, sputum production, wheezing and stridor.   Cardiovascular: Negative for chest pain, palpitations, orthopnea, leg swelling and PND.  Gastrointestinal: Negative for abdominal pain, constipation, diarrhea, heartburn, nausea and vomiting.  Genitourinary: Negative for dysuria, hematuria and urgency.  Musculoskeletal: Negative for joint pain and myalgias.  Skin: Negative for itching and rash.  Neurological: Negative for dizziness, tingling, weakness and headaches.  Endo/Heme/Allergies: Negative for environmental allergies. Does not bruise/bleed easily.  Psychiatric/Behavioral: Negative for depression. The patient is not nervous/anxious and does not have insomnia.   All other systems reviewed and are negative.    Objective:  Physical Exam Vitals signs reviewed.  Constitutional:      General: She is not in acute distress.    Appearance: She is well-developed.  HENT:     Head: Normocephalic and atraumatic.  Eyes:     General: No scleral icterus.     Conjunctiva/sclera: Conjunctivae normal.  Pupils: Pupils are equal, round, and reactive to light.  Neck:     Musculoskeletal: Neck supple.     Vascular: No JVD.     Trachea: No tracheal deviation.  Cardiovascular:     Rate and Rhythm: Normal rate and regular rhythm.     Heart sounds: Normal heart sounds. No murmur.  Pulmonary:     Effort: Pulmonary effort is normal. No tachypnea, accessory muscle usage or respiratory distress.     Breath sounds: Normal breath sounds. No stridor. No wheezing, rhonchi or rales.  Abdominal:     General: Bowel sounds are normal. There is no distension.     Palpations: Abdomen is soft.     Tenderness: There is no abdominal tenderness.  Musculoskeletal:        General: No tenderness.  Lymphadenopathy:     Cervical: No cervical adenopathy.  Skin:    General: Skin is warm and dry.     Capillary Refill: Capillary refill takes less than 2 seconds.     Findings: No rash.  Neurological:     Mental Status: She is alert and oriented to person, place, and time.  Psychiatric:        Behavior: Behavior normal.      Vitals:   11/01/18 1440  BP: 122/80  Pulse: 73  Temp: 98 F (36.7 C)  TempSrc: Temporal  SpO2: 97%  Weight: 146 lb (66.2 kg)  Height: 5' (1.524 m)   97% on RA BMI Readings from Last 3 Encounters:  11/01/18 28.51 kg/m  10/30/18 27.73 kg/m  09/05/18 28.36 kg/m   Wt Readings from Last 3 Encounters:  11/01/18 146 lb (66.2 kg)  10/30/18 142 lb (64.4 kg)  09/05/18 145 lb 3.2 oz (65.9 kg)     CBC    Component Value Date/Time   WBC 8.2 10/30/2018 1911   RBC 4.16 10/30/2018 1911   HGB 10.4 (L) 10/30/2018 1911   HCT 33.5 (L) 10/30/2018 1911   PLT 230 10/30/2018 1911   MCV 80.5 10/30/2018 1911   MCH 25.0 (L) 10/30/2018 1911   MCHC 31.0 10/30/2018 1911   RDW 17.4 (H) 10/30/2018 1911   LYMPHSABS 1.3 08/12/2018 0526   MONOABS 0.6 08/12/2018 0526   EOSABS 0.0 08/12/2018 0526   BASOSABS 0.0 08/12/2018 0526    Chest  Imaging: CXR - 01/21/2018 IMPRESSION: Stable cardiomegaly and left greater than right basilar scarring. No active disease. The patient's images have been independently reviewed by me.   Chest x-ray June and July: Consistent with prior median sternotomy history of CABG. July chest x-ray with left-sided pleural effusion.  Bibasilar atelectasis The patient's images have been independently reviewed by me.   Chest x-ray October 2020: Mild cardiomegaly, basilar scarring in the left.  Unclear to me if there is a effusion on the left side. The patient's images have been independently reviewed by me.    Pulmonary Functions Testing Results: PFT Results Latest Ref Rng & Units 02/08/2018  FVC-Pre L 1.46  FVC-Predicted Pre % 58  FVC-Post L 1.46  FVC-Predicted Post % 58  Pre FEV1/FVC % % 80  Post FEV1/FCV % % 82  FEV1-Pre L 1.17  FEV1-Predicted Pre % 62  FEV1-Post L 1.19  DLCO UNC% % 67  DLCO COR %Predicted % 112  TLC L 3.41  TLC % Predicted % 76  RV % Predicted % 92    FeNO: None   Pathology: None   Echocardiogram:   Echocardiogram July 2020: Depressed ejection fraction 45 to 50%,  left ventricular hypokinesis.  Elevated PA pressures  Heart Catheterization: no reports I can see    Assessment & Plan:     ICD-10-CM   1. Moderate persistent asthma, unspecified whether complicated  O67.12   2. Shortness of breath  R06.02 CT CHEST HIGH RESOLUTION    CANCELED: CT Chest Wo Contrast  3. Acute on chronic left systolic heart failure (HCC)  I50.23   4. Second hand smoke exposure  Z77.22     Discussion:  73 year old moderate persistent asthma at baseline.  Recently seen in the ER for exacerbation of symptoms also called into the office was treated with antibiotics and steroids.  Also treated for migraine headache in the ER.  Chest x-ray revealed lower lobe scarring in the left side and enlarged cardiac silhouette.  Previous pulmonary function test with evidence of restriction based on  reduced FEV1 and FVC and reduced DLCO.  Plan: As for her ongoing shortness of breath symptoms I think is reasonable that we obtain further imaging of the chest. We will get a HRCT of the chest rule out any evidence of ILD. Continue Symbicort 160, 2 puffs twice daily. She can also continue her over-the-counter antihistamines and nasal spray.  Patient to return to clinic in approximately 6 to 8 weeks after CT chest has been completed.  We will review the results at that time.  Greater than 50% of this patient's 15-minute office visit was been face-to-face discussing above recommendations treatment plan.   Current Outpatient Medications:    acetaminophen (TYLENOL) 325 MG tablet, Take 2 tablets (650 mg total) by mouth every 6 (six) hours as needed for fever or headache., Disp: 30 tablet, Rfl: 0   Acetaminophen 650 MG/20.3ML SOLN, Take 20.3 mLs (650 mg total) by mouth 3 (three) times daily as needed., Disp: 400 mL, Rfl: 0   albuterol (PROVENTIL) (2.5 MG/3ML) 0.083% nebulizer solution, Take 3 mLs (2.5 mg total) by nebulization every 6 (six) hours as needed for wheezing or shortness of breath., Disp: 75 mL, Rfl: 12   albuterol (VENTOLIN HFA) 108 (90 Base) MCG/ACT inhaler, Inhale 2 puffs into the lungs every 6 (six) hours as needed for wheezing., Disp: 3 Inhaler, Rfl: 1   apixaban (ELIQUIS) 5 MG TABS tablet, Take 1 tablet (5 mg total) by mouth 2 (two) times daily., Disp: 60 tablet, Rfl: 3   budesonide-formoterol (SYMBICORT) 160-4.5 MCG/ACT inhaler, Inhale 2 puffs into the lungs every 12 (twelve) hours., Disp: 1 Inhaler, Rfl: 5   cetirizine (ZYRTEC) 10 MG tablet, Take 1 tablet (10 mg total) by mouth daily., Disp: 30 tablet, Rfl: 0   dextromethorphan-guaiFENesin (MUCINEX DM) 30-600 MG 12hr tablet, Take 1 tablet by mouth 2 (two) times daily., Disp: , Rfl:    famotidine (PEPCID) 20 MG tablet, Take 20 mg by mouth daily before breakfast. , Disp: , Rfl: 3   ferrous sulfate 325 (65 FE) MG tablet,  Take 1 tablet (325 mg total) by mouth 3 (three) times daily with meals., Disp: 90 tablet, Rfl: 3   fluticasone (FLONASE) 50 MCG/ACT nasal spray, Place 2 sprays into both nostrils 2 (two) times daily., Disp: 1 g, Rfl: 2   furosemide (LASIX) 20 MG tablet, Take 1 tablet (20 mg total) by mouth 2 (two) times daily., Disp: , Rfl:    isosorbide mononitrate (IMDUR) 30 MG 24 hr tablet, Take 1 tablet by mouth daily., Disp: , Rfl:    losartan (COZAAR) 100 MG tablet, Take 1 tablet by mouth daily., Disp: , Rfl:  metFORMIN (GLUCOPHAGE XR) 500 MG 24 hr tablet, Take 1 tablet (500 mg total) by mouth daily with breakfast. (Patient taking differently: Take 500 mg by mouth 2 (two) times daily. ), Disp: 30 tablet, Rfl: 2   metoprolol succinate (TOPROL-XL) 50 MG 24 hr tablet, Take 50 mg by mouth daily. Take with or immediately following a meal., Disp: , Rfl:    montelukast (SINGULAIR) 10 MG tablet, Take 1 tablet (10 mg total) by mouth at bedtime., Disp: 30 tablet, Rfl: 11   Multiple Vitamin (MULTIVITAMIN WITH MINERALS) TABS tablet, Take 1 tablet by mouth daily., Disp: 30 tablet, Rfl: 3   nitroGLYCERIN (NITROSTAT) 0.4 MG SL tablet, Place 0.4 mg under the tongue every 5 (five) minutes as needed for chest pain. , Disp: , Rfl:    zolpidem (AMBIEN) 5 MG tablet, Take 1 tablet by mouth at bedtime as needed., Disp: , Rfl:    [name], DO [facility] 11/01/2018 2:48 PM

## 2018-11-01 NOTE — Patient Instructions (Signed)
Thank you for visiting Dr. Valeta Harms at [facility]. Today we recommend the following: Orders Placed This Encounter  Procedures  . CT CHEST HIGH RESOLUTION   Please return after completion of the HRCT.   Return in about 6 weeks (around 12/13/2018), or if symptoms worsen or fail to improve.    Please do your part to reduce the spread of COVID-19.

## 2018-11-06 ENCOUNTER — Inpatient Hospital Stay: Admission: RE | Admit: 2018-11-06 | Payer: Medicare Other | Source: Ambulatory Visit

## 2018-11-13 ENCOUNTER — Encounter: Payer: Self-pay | Admitting: Cardiology

## 2018-11-13 ENCOUNTER — Other Ambulatory Visit: Payer: Self-pay

## 2018-11-13 ENCOUNTER — Ambulatory Visit (INDEPENDENT_AMBULATORY_CARE_PROVIDER_SITE_OTHER): Payer: Medicare Other | Admitting: Cardiology

## 2018-11-13 VITALS — BP 154/80 | HR 64 | Temp 97.2°F | Ht 61.0 in | Wt 147.3 lb

## 2018-11-13 DIAGNOSIS — I4821 Permanent atrial fibrillation: Secondary | ICD-10-CM

## 2018-11-13 DIAGNOSIS — R0609 Other forms of dyspnea: Secondary | ICD-10-CM

## 2018-11-13 DIAGNOSIS — R0600 Dyspnea, unspecified: Secondary | ICD-10-CM | POA: Diagnosis not present

## 2018-11-13 DIAGNOSIS — Z951 Presence of aortocoronary bypass graft: Secondary | ICD-10-CM

## 2018-11-13 DIAGNOSIS — Z9889 Other specified postprocedural states: Secondary | ICD-10-CM | POA: Diagnosis not present

## 2018-11-13 DIAGNOSIS — I25118 Atherosclerotic heart disease of native coronary artery with other forms of angina pectoris: Secondary | ICD-10-CM | POA: Diagnosis not present

## 2018-11-13 DIAGNOSIS — R002 Palpitations: Secondary | ICD-10-CM | POA: Diagnosis not present
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
## 2018-11-15 ENCOUNTER — Encounter: Payer: Self-pay | Admitting: Cardiology

## 2018-11-16 LAB — AFB CULTURE WITH SMEAR (NOT AT ARMC)
Acid Fast Culture: NEGATIVE
Acid Fast Smear: NEGATIVE

## 2018-11-23 ENCOUNTER — Ambulatory Visit
Admission: RE | Admit: 2018-11-23 | Discharge: 2018-11-23 | Disposition: A | Discharge status: Home / Self Care | Payer: Medicare Other | Source: Ambulatory Visit | Attending: Pulmonary Disease | Admitting: Pulmonary Disease

## 2018-11-23 ENCOUNTER — Telehealth: Payer: Self-pay | Admitting: Pulmonary Disease

## 2018-11-23 DIAGNOSIS — R0602 Shortness of breath: Secondary | ICD-10-CM

## 2018-11-23 DIAGNOSIS — E079 Disorder of thyroid, unspecified: Secondary | ICD-10-CM

## 2018-11-23 DIAGNOSIS — N2889 Other specified disorders of kidney and ureter: Secondary | ICD-10-CM

## 2018-11-23 NOTE — Telephone Encounter (Signed)
PCCM:  I attempted to call the patient at the number provided in the chart.  There was no answer.  There was an Civil engineer, contracting.  I did leave a voicemail message for her to give [place] a call back at our office telephone number [phone].  We will try to reach the patient tomorrow regarding her CT scan results.  She will likely need a referral to urology for evaluation of the renal mass that was found as well as a thyroid ultrasound to evaluate the thyroid mass.  And needs follow-up with primary care regarding both of these.  CC: PCP - Sonia Side., FNP   [name], DO [facility] 11/23/2018 5:44 PM

## 2018-11-24 NOTE — Telephone Encounter (Signed)
LMTCB

## 2018-11-27 NOTE — Telephone Encounter (Signed)
Order placed, atc patient unable to reach x2

## 2018-11-27 NOTE — Telephone Encounter (Signed)
Dr. Valeta Harms wants to talk to patient himself. Left message for patient  Will route message to Dr. Valeta Harms as a reminder

## 2018-11-27 NOTE — Telephone Encounter (Signed)
I tried to call the patient again. No answer. Just goes to VM.   IMPRESSION: 1. There are areas of parenchymal banding throughout the mid to lower lungs bilaterally, which is favored to reflect chronic post infectious or inflammatory scarring. No other imaging findings to clearly indicate interstitial lung disease. 2. Small left pleural effusion with some associated passive subsegmental atelectasis in the left lower lobe. 3. Hemorrhagic lesion in the upper pole of the left kidney, presumably an angiomyolipoma based on comparison with remote prior CT the abdomen and pelvis 07/16/2011. Consultation with Urology for further workup and management is recommended at this time. 4. Dilatation of the pulmonic trunk (3.6 cm in diameter), which may suggest underlying pulmonary arterial hypertension. 5. 2 mm nonobstructive calculus in the upper pole collecting system of left kidney. 6. Aortic atherosclerosis, in addition to left main and 3 vessel coronary artery disease. Status post median sternotomy for CABG including LIMA to the LAD. 7. 3.2 x 2.9 cm intermediate attenuation lesion in the left lobe of the thyroid gland is indeterminate. Further evaluation with nonemergent thyroid ultrasound is recommended in the near future to better evaluate this lesion and determine potential need for fine-needle aspiration.  PLAN:  Referral to Urology needs to be placed Dx: renal mass  Referral to ENT needs to be placed, Dx Thyroid Mass Please place the following referrals. Ok to let the patient know these are the reasons for referral if she calls back. I am happy to talk to her but need a time that she can be reached.   [name], DO [facility] 11/27/2018 3:56 PM

## 2018-11-27 NOTE — Telephone Encounter (Signed)
Pt returning call and would like a call back to speak with BI or his nurse about her results

## 2018-11-27 NOTE — Addendum Note (Signed)
Addended by: Amado Coe on: 11/27/2018 04:06 PM   Modules accepted: Orders

## 2018-12-01 ENCOUNTER — Ambulatory Visit (INDEPENDENT_AMBULATORY_CARE_PROVIDER_SITE_OTHER): Payer: Medicare Other | Admitting: Pulmonary Disease

## 2018-12-01 ENCOUNTER — Other Ambulatory Visit: Payer: Self-pay

## 2018-12-01 ENCOUNTER — Encounter: Payer: Self-pay | Admitting: Pulmonary Disease

## 2018-12-01 DIAGNOSIS — J4540 Moderate persistent asthma, uncomplicated: Secondary | ICD-10-CM | POA: Diagnosis not present

## 2018-12-01 DIAGNOSIS — N2889 Other specified disorders of kidney and ureter: Secondary | ICD-10-CM | POA: Insufficient documentation

## 2018-12-01 DIAGNOSIS — E079 Disorder of thyroid, unspecified: Secondary | ICD-10-CM

## 2018-12-01 DIAGNOSIS — Z7189 Other specified counseling: Secondary | ICD-10-CM

## 2018-12-01 DIAGNOSIS — R0602 Shortness of breath: Secondary | ICD-10-CM | POA: Diagnosis not present

## 2018-12-01 DIAGNOSIS — I5023 Acute on chronic systolic (congestive) heart failure: Secondary | ICD-10-CM

## 2018-12-01 DIAGNOSIS — R918 Other nonspecific abnormal finding of lung field: Secondary | ICD-10-CM | POA: Insufficient documentation

## 2018-12-01 MED ORDER — PREDNISONE 10 MG PO TABS: ORAL_TABLET | ORAL | 0 refills | Status: DC

## 2018-12-01 MED ORDER — DOXYCYCLINE HYCLATE 100 MG PO TABS: 100.0000 mg | ORAL_TABLET | Freq: Two times a day (BID) | ORAL | 0 refills | Status: DC

## 2018-12-01 NOTE — Assessment & Plan Note (Signed)
Plan: Continue to follow-up with Dr. Einar Gip

## 2018-12-01 NOTE — Assessment & Plan Note (Signed)
Discussed these results with patient today  Plan: Continue forward with the ENT referral Continue forward with urology referral Continue forward with primary care follow-up

## 2018-12-01 NOTE — Patient Instructions (Addendum)
You were seen today by Lauraine Rinne, NP  for:   1. Moderate persistent asthma  - doxycycline (VIBRA-TABS) 100 MG tablet; Take 1 tablet (100 mg total) by mouth 2 (two) times daily.  Dispense: 14 tablet; Refill: 0 - predniSONE (DELTASONE) 10 MG tablet; 4 tabs for 2 days, then 3 tabs for 2 days, 2 tabs for 2 days, then 1 tab for 2 days, then stop  Dispense: 20 tablet; Refill: 0  Continue Symbicort 160 >>> 2 puffs in the morning right when you wake up, rinse out your mouth after use, 12 hours later 2 puffs, rinse after use >>> Take this daily, no matter what >>> This is not a rescue inhaler   Only use your albuterol as a rescue medication to be used if you can't catch your breath by resting or doing a relaxed purse lip breathing pattern.  - The less you use it, the better it will work when you need it. - Ok to use up to 2 puffs  every 4 hours if you must but call for immediate appointment if use goes up over your usual need - Don't leave home without it !!  (think of it like the spare tire for your car)    2. Shortness of breath  Continue follow up with cardiology  3. Thyroid mass  Referral already placed to [facility] ear nose [facility] throat  Schedule an appointment with primary care  4. Renal mass  Referral already placed for urology  Schedule appointment with primary care   We recommend today:  No orders of the defined types were placed in this encounter.  No orders of the defined types were placed in this encounter.  Meds ordered this encounter  Medications  . doxycycline (VIBRA-TABS) 100 MG tablet    Sig: Take 1 tablet (100 mg total) by mouth 2 (two) times daily.    Dispense:  14 tablet    Refill:  0  . predniSONE (DELTASONE) 10 MG tablet    Sig: 4 tabs for 2 days, then 3 tabs for 2 days, 2 tabs for 2 days, then 1 tab for 2 days, then stop    Dispense:  20 tablet    Refill:  0    Follow Up:    Return in about 6 weeks (around 01/12/2019), or if symptoms worsen or fail  to improve, for Follow up with Dr. Valeta Harms.   Please do your part to reduce the spread of COVID-19:      Reduce your risk of any infection  and COVID19 by using the similar precautions used for avoiding the common cold or flu:  Marland Kitchen Wash your hands often with soap and warm water for at least 20 seconds.  If soap and water are not readily available, use an alcohol-based hand sanitizer with at least 60% alcohol.  . If coughing or sneezing, cover your mouth and nose by coughing or sneezing into the elbow areas of your shirt or coat, into a tissue or into your sleeve (not your hands). Langley Gauss A MASK when in public  . Avoid shaking hands with others and consider head nods or verbal greetings only. . Avoid touching your eyes, nose, or mouth with unwashed hands.  . Avoid close contact with people who are sick. . Avoid places or events with large numbers of people in one location, like concerts or sporting events. . If you have some symptoms but not all symptoms, continue to monitor at home and seek medical attention  if your symptoms worsen. . If you are having a medical emergency, call 911.   [place] / e-Visit: [email or web address]         [facility]: [facility]: [phone]                   [facility]: [phone]     It is flu season:   >>> Best ways to protect herself from the flu: Receive the yearly flu vaccine, practice good hand hygiene washing with soap and also using hand sanitizer when available, eat a nutritious meals, get adequate rest, hydrate appropriately   Please contact the office if your symptoms worsen or you have concerns that you are not improving.   Thank you for choosing [facility] for your healthcare, and for allowing [place] to partner with you on your healthcare journey. I am thankful to be able to  provide care to you today.   Wyn Quaker FNP-C
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
## 2018-12-01 NOTE — Assessment & Plan Note (Signed)
T2 lower neutrophilic asthma  Plan: Continue Symbicort 160 Prednisone taper today Doxycycline today  Close follow-up in our office to ensure patient's plan of care is working If symptoms worsen patient needs to present to the emergency room

## 2018-12-01 NOTE — Assessment & Plan Note (Signed)
Plan: Complete follow-up with the ENT

## 2018-12-01 NOTE — Progress Notes (Signed)
Virtual Visit via Telephone Note  I connected with Sharon Spears on 12/01/18 at  2:30 PM EST by telephone and verified that I am speaking with the correct person using two identifiers.  Location: Patient: Home Provider: [facility] - [address], [place], [place], [place] [address]   I discussed the limitations, risks, security and privacy concerns of performing an evaluation and management service by telephone and the availability of in person appointments. I also discussed with the patient that there may be a patient responsible charge related to this service. The patient expressed understanding and agreed to proceed.  Patient consented to consult via telephone: Yes People present and their role in pt care: Pt   History of Present Illness: 73 year old female never smoker but with prolonged smoke exposure living in [place] for over 50 years referred to our office on 01/26/2018 for prolonged dyspnea.  Also managed for asthma.  PMH: Congestive heart failure Smoker/ Smoking History: Never smoker Maintenance: Symbicort 160 Pt of: Dr. Valeta Harms  Chief complaint: Recent CT results   73 year old female never smoker followed in our office for asthma.  She also has ongoing dyspnea and recurrent bronchitis-like episodes.  She recently completed a high-resolution CT scan that did not reveal interstitial lung disease but does show likely postinflammatory scarring.  She is also recently established with cardiology Dr. Einar Gip.  She has not followed back up with primary care.  Her high-resolution CT chest did show a thyroid mass as well as a renal mass.  She has been referred to urology as well as to ear nose and throat regarding this.  She has not followed up with primary care.   Patient reports that she has had 48 hours of cough and congestion.  She has had increased nasal drainage as well as sinus pressure over the last week.  She denies fevers.  She is unable to cough up the mucus.   She has increased eustachian tube pressure.  She reports that she feels like it is hard to breathe and that her lungs are too tight.  She reports that she is wheezing.  She is using her rescue inhaler about every 6 hours.  Patient continues to emphasize today that all of her breathing symptoms have worsened ever since her CABG.  Unfortunately patient continues to have issues with using her phone as if she is in her trailer then her phone does not work.  This continues to be an ongoing barrier for the patient as well as for follow-up with her care.   Patient denies any familial history of pulmonary fibrosis.  She does report that her mother who was a former smoker did have emphysema.  Observations/Objective:  02/08/2018-pulmonary function test - FVC 1.46 (58% predicted), postbronchodilator ratio 82, postbronchodilator FEV1 1.19 (63% predicted), no significant bronchodilator response, slight mid flow reversibility after administration of bronchodilators, DLCO is 67 which corrects to 79 based off of hemoglobin.  06/12/2018-IgE-49 06/12/2018-CBC with differential-eosinophils relative 0.6, eosinophils absolute 0  11/23/2018-CT chest high-res-there are areas of parenchymal banding throughout the mid to lower lungs bilaterally which favored to reflect chronic postinfectious or inflammatory scarring, no imaging findings to clearly indicate interstitial lung disease, small left pleural effusion, hemorrhagic lesion in the upper pole of the left kidney, presumably a Angio myolipoma based on comparison with remote prior CT the abdomen pelvis from 07/16/2011.  Consultation with urology for further work-up and management is recommended.  Dilatation of the pulmonic trunk which may suggest pulmonary arterial  hypertension, 2 mm nonobstructive calculus in the upper pole collecting system of left kidney, aortic arthrosclerosis, 3.2 x 2.9 cm intermediate attenuation lesion in the left lobe of the thyroid gland is  indeterminate, further evaluation with nonemergent thyroid ultrasound is recommended in the near future to better evaluate this lesion and determine potential need for fine-needle aspiration  07/20/2018-echocardiogram-LV has mildly reduced systolic function, EF of 45 to 50%, cavity size normal, right ventricle has normal systolic function, no increase in right ventricular wall thickness, left atrial size was mildly dilated, right atrial size was mildly dilated, moderate thickening of mitral valve leaflet, moderate calcification of the mitral valve leaflet  Assessment and Plan:  Asthma T2 lower neutrophilic asthma  Plan: Continue Symbicort 160 Prednisone taper today Doxycycline today  Close follow-up in our office to ensure patient's plan of care is working If symptoms worsen patient needs to present to the emergency room  Abnormal findings on diagnostic imaging of lung Discussed these results with patient today  Plan: Continue forward with the ENT referral Continue forward with urology referral Continue forward with primary care follow-up  Renal mass Plan: Complete follow-up with urology  Thyroid mass Plan: Complete follow-up with the ENT  Shortness of breath Plan: Continue Symbicort 160 Continue to work with cardiology Pulmonary trunk on CT imaging does look like it may favor pulmonary arterial hypertension, most recent echocardiogram does not indicate RV dysfunction  Acute on chronic left systolic heart failure ([place]) Plan: Continue to follow-up with Dr. Einar Gip  Complex care coordination Patient with complex care needs Patient struggles with telephone communication with our office as well as other offices.  She reports that her cell phone does not work when she is located in her trailer.  This makes it difficult for care management.  Patient is aware that she has pending referrals to urology as well as ear nose and throat.  Patient is aware she needs to schedule an  appointment with primary care.   Follow Up Instructions:  Return in about 6 weeks (around 01/12/2019), or if symptoms worsen or fail to improve, for Follow up with Dr. Valeta Harms.   I discussed the assessment and treatment plan with the patient. The patient was provided an opportunity to ask questions and all were answered. The patient agreed with the plan and demonstrated an understanding of the instructions.   The patient was advised to call back or seek an in-person evaluation if the symptoms worsen or if the condition fails to improve as anticipated.  I provided 32 minutes of non-face-to-face time during this encounter.   Lauraine Rinne, NP

## 2018-12-01 NOTE — Assessment & Plan Note (Signed)
Plan: Continue Symbicort 160 Continue to work with cardiology Pulmonary trunk on CT imaging does look like it may favor pulmonary arterial hypertension, most recent echocardiogram does not indicate RV dysfunction

## 2018-12-01 NOTE — Assessment & Plan Note (Signed)
Plan: Complete follow-up with urology

## 2018-12-05 ENCOUNTER — Ambulatory Visit (HOSPITAL_COMMUNITY)
Admission: EM | Admit: 2018-12-05 | Discharge: 2018-12-05 | Disposition: A | Discharge status: Home / Self Care | Payer: Medicare Other | Attending: Family Medicine | Admitting: Family Medicine

## 2018-12-05 ENCOUNTER — Encounter (HOSPITAL_COMMUNITY): Payer: Self-pay | Admitting: Emergency Medicine

## 2018-12-05 ENCOUNTER — Other Ambulatory Visit: Payer: Self-pay

## 2018-12-05 DIAGNOSIS — J029 Acute pharyngitis, unspecified: Secondary | ICD-10-CM | POA: Diagnosis present

## 2018-12-05 LAB — POCT RAPID STREP A: Streptococcus, Group A Screen (Direct): NEGATIVE

## 2018-12-05 MED ORDER — MAGIC MOUTHWASH W/LIDOCAINE: 5.0000 mL | Freq: Three times a day (TID) | ORAL | 0 refills | Status: DC | PRN

## 2018-12-05 MED ORDER — ONDANSETRON 4 MG PO TBDP: 4.0000 mg | ORAL_TABLET | Freq: Three times a day (TID) | ORAL | 0 refills | Status: DC | PRN

## 2018-12-05 NOTE — ED Triage Notes (Signed)
Patient seen 10/26/2018 at [facility]  Patient seen by pcp 12/01/2018 Patient reports pcp treating her with prednisone and doxycycline-started taking 12/01/2018.  Patient reports she is not feeling any better.   Patient has sore throat, cough, minimal stomach pain.  Painful swallowing.  99.3 temp.

## 2018-12-05 NOTE — ED Provider Notes (Signed)
[facility]    CSN: [id number] Arrival date & time: 12/05/18  1217      History   Chief Complaint Chief Complaint  Patient presents with  . Sore Throat    HPI Sharon Spears is a 73 y.o. female history of asthma, A. fib, hypertension, CAD, DM type II, previous tonsillectomy, presenting today for evaluation of sore throat.  Patient states that over the past 2 days she has developed sore throat and a swollen gland.  She has also noticed a blister developed to the right side of her tongue.  She has been using peroxide without relief of symptoms.  She has also had a cough, but she has been following up with pulmonology for this.  She recently started taking a course of prednisone as well as doxycycline 4 days ago.  She is having difficulty keeping the doxycycline down as she vomits after taking this.  Feels the prednisone is helping.  Temperature has been up to 99.3..  Overall patient has had similar symptoms off and on for the past month.  HPI  Past Medical History:  Diagnosis Date  . Adjustment disorder with anxiety   . Asthma   . Atrial fibrillation ([place])   . Benign essential hypertension   . Coronary artery disease   . Diabetic neuropathic arthritis ([place])   . Heart palpitations   . Hypoxia 07/19/2018  . Other cirrhosis of liver ([place]) 07/20/2018  . Uncontrolled diabetes mellitus type 2 without complications     Patient Active Problem List   Diagnosis Date Noted  . Thyroid mass 12/01/2018  . Renal mass 12/01/2018  . Abnormal findings on diagnostic imaging of lung 12/01/2018  . Complex care coordination 12/01/2018  . Other cirrhosis of liver ([place]) 07/20/2018  . Unspecified atrial fibrillation ([place]) 07/20/2018  . Acute respiratory failure with hypoxemia ([place]) 07/19/2018  . Asthma 06/12/2018  . Cough 02/08/2018  . Shortness of breath 02/08/2018  . Acute on chronic left systolic heart failure ([place]) 09/25/2017  . Thrombocytopenia ([place]) 03/20/2015  . Diabetes  mellitus type 2, controlled ([place]) 03/20/2015  . HTN (hypertension) 03/20/2015  . S/P CABG (coronary artery bypass graft) 03/20/2015    Past Surgical History:  Procedure Laterality Date  . CARDIAC SURGERY    . CESAREAN SECTION    . CORONARY ARTERY BYPASS GRAFT  07/06/2017   Redo-sternotomy and CABG 2 with LIMA to LAD, SVG to OM, and a a closure, mitral valve repair with #30 mm [place] ring on 07/06/2017  . ESOPHAGEAL MANOMETRY N/A 03/06/2012   Procedure: ESOPHAGEAL MANOMETRY (EM);  Surgeon: Beryle Beams, MD;  Location: [facility];  Service: Endoscopy;  Laterality: N/A;  . TONSILLECTOMY      OB History   No obstetric history on file.      Home Medications    Prior to Admission medications   Medication Sig Start Date End Date Taking? Authorizing Provider  acetaminophen (TYLENOL) 325 MG tablet Take 2 tablets (650 mg total) by mouth every 6 (six) hours as needed for fever or headache. 10/31/18   Horton, Barbette Hair, MD  albuterol (PROVENTIL) (2.5 MG/3ML) 0.083% nebulizer solution Take 3 mLs (2.5 mg total) by nebulization every 6 (six) hours as needed for wheezing or shortness of breath. 06/12/18   Fenton Foy, NP  albuterol (VENTOLIN HFA) 108 (90 Base) MCG/ACT inhaler Inhale 2 puffs into the lungs every 6 (six) hours as needed for wheezing. 06/12/18   Fenton Foy, NP  apixaban (ELIQUIS) 5 MG TABS  tablet Take 1 tablet (5 mg total) by mouth 2 (two) times daily. Patient taking differently: Take 5 mg by mouth 2 (two) times daily. STOPPED TEMPORARILY, INTERACTION WITH ANTIBIOTIC 09/28/17   Charolette Forward, MD  ASPIRIN 81 81 MG EC tablet Take 81 mg by mouth. occas 07/24/18   [provider]  cetirizine (ZYRTEC) 10 MG tablet Take 1 tablet (10 mg total) by mouth daily. Patient taking differently: Take 10 mg by mouth as needed.  10/26/18   Jaynee Eagles, PA-C  doxycycline (VIBRA-TABS) 100 MG tablet Take 1 tablet (100 mg total) by mouth 2 (two) times daily. 12/01/18   Lauraine Rinne,  NP  famotidine (PEPCID) 20 MG tablet Take 20 mg by mouth daily before breakfast.  02/04/15   [provider]  ferrous sulfate 325 (65 FE) MG tablet Take 1 tablet (325 mg total) by mouth 3 (three) times daily with meals. Patient taking differently: Take 325 mg by mouth daily.  09/28/17   Charolette Forward, MD  fluticasone (FLONASE) 50 MCG/ACT nasal spray Place 2 sprays into both nostrils 2 (two) times daily. Patient taking differently: Place 2 sprays into both nostrils as needed.  05/04/18   Lamptey, Myrene Galas, MD  furosemide (LASIX) 20 MG tablet Take 1 tablet (20 mg total) by mouth 2 (two) times daily. 07/21/18   Samuella Cota, MD  isosorbide mononitrate (IMDUR) 30 MG 24 hr tablet Take 1 tablet by mouth as needed.  07/12/18   [provider]  losartan (COZAAR) 100 MG tablet Take 1 tablet by mouth daily. 08/16/18   [provider]  magic mouthwash w/lidocaine SOLN Take 5 mLs by mouth 3 (three) times daily as needed for mouth pain. 12/05/18   ,  C, PA-C  metFORMIN (GLUCOPHAGE XR) 500 MG 24 hr tablet Take 1 tablet (500 mg total) by mouth daily with breakfast. Patient taking differently: Take 500 mg by mouth 2 (two) times daily.  06/02/16   Golden Circle, FNP  montelukast (SINGULAIR) 10 MG tablet Take 1 tablet (10 mg total) by mouth at bedtime. Patient taking differently: Take 10 mg by mouth as needed.  06/12/18   Fenton Foy, NP  Multiple Vitamin (MULTIVITAMIN WITH MINERALS) TABS tablet Take 1 tablet by mouth daily. Patient taking differently: Take 1 tablet by mouth. occas 09/28/17   Charolette Forward, MD  nitroGLYCERIN (NITROSTAT) 0.4 MG SL tablet Place 0.4 mg under the tongue every 5 (five) minutes as needed for chest pain.     [provider]  Olopatadine HCl 0.2 % SOLN Apply   into affected eye  as needed 07/24/18   [provider]  ondansetron (ZOFRAN ODT) 4 MG disintegrating tablet Take 1 tablet (4 mg total) by mouth every 8 (eight) hours as  needed for nausea or vomiting. 12/05/18   ,  C, PA-C  predniSONE (DELTASONE) 10 MG tablet 4 tabs for 2 days, then 3 tabs for 2 days, 2 tabs for 2 days, then 1 tab for 2 days, then stop 12/01/18   Lauraine Rinne, NP  traMADol (ULTRAM) 50 MG tablet as needed. 10/23/18   [provider]    Family History Family History  Problem Relation Age of Onset  . Asthma Mother   . Alcohol abuse Father     Social History Social History   Tobacco Use  . Smoking status: Never Smoker  . Smokeless tobacco: Never Used  Substance Use Topics  . Alcohol use: No  . Drug use: Never  Allergies   Contrast media [iodinated diagnostic agents], Adhesive [tape], and Penicillins   Review of Systems Review of Systems  Constitutional: Negative for activity change, appetite change, chills, fatigue and fever.  HENT: Positive for mouth sores and sore throat. Negative for congestion, ear pain, rhinorrhea, sinus pressure and trouble swallowing.   Eyes: Negative for discharge and redness.  Respiratory: Positive for cough. Negative for chest tightness and shortness of breath.   Cardiovascular: Negative for chest pain.  Gastrointestinal: Negative for abdominal pain, diarrhea, nausea and vomiting.  Musculoskeletal: Negative for myalgias.  Skin: Negative for rash.  Neurological: Negative for dizziness, light-headedness and headaches.     Physical Exam Triage Vital Signs ED Triage Vitals  Enc Vitals Group     BP 12/05/18 1314 (!) 177/82     Pulse Rate 12/05/18 1314 96     Resp 12/05/18 1314 (!) 26     Temp 12/05/18 1314 99.3 F (37.4 C)     Temp Source 12/05/18 1314 Oral     SpO2 12/05/18 1349 97 %     Weight --      Height --      Head Circumference --      Peak Flow --      Pain Score 12/05/18 1306 9     Pain Loc --      Pain Edu? --      Excl. in [place]? --    No data found.  Updated Vital Signs BP (!) 177/82 (BP Location: Right Arm)   Pulse 96   Temp 99.3 F (37.4 C)  (Oral)   Resp (!) 26   SpO2 97%   Visual Acuity Right Eye Distance:   Left Eye Distance:   Bilateral Distance:    Right Eye Near:   Left Eye Near:    Bilateral Near:     Physical Exam Vitals signs and nursing note reviewed.  Constitutional:      General: She is not in acute distress.    Appearance: She is well-developed.  HENT:     Head: Normocephalic and atraumatic.     Ears:     Comments: Bilateral ears without tenderness to palpation of external auricle, tragus and mastoid, EAC's without erythema or swelling, TM's with good bony landmarks and cone of light. Non erythematous.     Mouth/Throat:     Comments: Oral mucosa pink and moist, no tonsillar enlargement or exudate. Posterior pharynx patent and nonerythematous, no uvula deviation or swelling. Normal phonation.  Erythematous lesion with gray center on right side of tongue Eyes:     Conjunctiva/sclera: Conjunctivae normal.  Neck:     Musculoskeletal: Neck supple.     Comments: Palpable right tonsillar lymphadenopathy Cardiovascular:     Rate and Rhythm: Normal rate and regular rhythm.     Heart sounds: No murmur.  Pulmonary:     Effort: Pulmonary effort is normal. No respiratory distress.     Breath sounds: Normal breath sounds.     Comments: Breathing comfortably at rest, CTABL, no wheezing, rales or other adventitious sounds auscultated Abdominal:     Palpations: Abdomen is soft.     Tenderness: There is no abdominal tenderness.  Skin:    General: Skin is warm and dry.  Neurological:     Mental Status: She is alert.      UC Treatments / Results  Labs (all labs ordered are listed, but only abnormal results are displayed) Labs Reviewed  CULTURE, GROUP A STREP [facility])  POCT RAPID  STREP A    EKG   Radiology No results found.  Procedures Procedures (including critical care time)  Medications Ordered in UC Medications - No data to display  Initial Impression / Assessment and Plan / UC Course  I  have reviewed the triage vital signs and the nursing notes.  Pertinent labs & imaging results that were available during my care of the patient were reviewed by me and considered in my medical decision making (see chart for details).     Oropharynx exam unremarkable, strep test negative.  Does have aphthous ulcer appearing lesion to right side of tongue.  No nuchal rigidity.  Vital signs stable.  Currently on doxycycline and prednisone.  Will provide Magic mouthwash to use as needed to help with discomfort related to aphthous ulcer, would expect self resolution over the next 1 to 2 weeks.  Provided Zofran to use prior to taking doxycycline to prevent nausea/vomiting side effects.  Continue course of prednisone.  Advised to follow-up with PCP if tonsillar swelling persisting for further evaluation as well as patient had recent CT recommending follow-up ultrasound of thyroid.  Discussed strict return precautions. Patient verbalized understanding and is agreeable with plan.  Final Clinical Impressions(s) / UC Diagnoses   Final diagnoses:  Sore throat     Discharge Instructions     Sore Throat  Your rapid strep tested Negative today. We will send for a culture and call in about 2 days if results are positive. For now we will treat your sore throat as a virus with symptom management.   Please continue Tylenol or Ibuprofen for fever and pain. May try salt water gargles, cepacol lozenges, throat spray, or OTC cold relief medicine for throat discomfort. If you also have congestion take a daily anti-histamine like Zyrtec, Claritin, and a oral decongestant to help with post nasal drip that may be irritating your throat.   Stay hydrated and drink plenty of fluids to keep your throat coated relieve irritation.     ED Prescriptions    Medication Sig Dispense Auth. Provider   magic mouthwash w/lidocaine SOLN Take 5 mLs by mouth 3 (three) times daily as needed for mouth pain. 75 mL ,  C,  PA-C   ondansetron (ZOFRAN ODT) 4 MG disintegrating tablet Take 1 tablet (4 mg total) by mouth every 8 (eight) hours as needed for nausea or vomiting. 20 tablet , [place] C, PA-C     PDMP not reviewed this encounter.   Janith Lima, PA-C 12/05/18 1452

## 2018-12-05 NOTE — Telephone Encounter (Signed)
Pt aware. Recent tele-visit. Nothing further needed at this time.

## 2018-12-05 NOTE — Discharge Instructions (Signed)
Sore Throat  Your rapid strep tested Negative today. We will send for a culture and call in about 2 days if results are positive. For now we will treat your sore throat as a virus with symptom management.   Please continue Tylenol or Ibuprofen for fever and pain. May try salt water gargles, cepacol lozenges, throat spray, or OTC cold relief medicine for throat discomfort. If you also have congestion take a daily anti-histamine like Zyrtec, Claritin, and a oral decongestant to help with post nasal drip that may be irritating your throat.   Stay hydrated and drink plenty of fluids to keep your throat coated relieve irritation.

## 2018-12-07 LAB — CULTURE, GROUP A STREP (THRC)

## 2018-12-07 NOTE — Progress Notes (Signed)
PCCM: Thanks for speaking with her.  [name], DO [facility] 12/07/2018 5:33 PM

## 2018-12-10 ENCOUNTER — Emergency Department (HOSPITAL_COMMUNITY): Payer: Medicare Other

## 2018-12-10 ENCOUNTER — Encounter (HOSPITAL_COMMUNITY): Payer: Self-pay | Admitting: Emergency Medicine

## 2018-12-10 ENCOUNTER — Emergency Department (HOSPITAL_COMMUNITY)
Admission: EM | Admit: 2018-12-10 | Discharge: 2018-12-10 | Disposition: A | Discharge status: Home / Self Care | Payer: Medicare Other | Attending: Emergency Medicine | Admitting: Emergency Medicine

## 2018-12-10 DIAGNOSIS — J189 Pneumonia, unspecified organism: Secondary | ICD-10-CM | POA: Diagnosis not present

## 2018-12-10 DIAGNOSIS — E119 Type 2 diabetes mellitus without complications: Secondary | ICD-10-CM | POA: Diagnosis not present

## 2018-12-10 DIAGNOSIS — Z79899 Other long term (current) drug therapy: Secondary | ICD-10-CM | POA: Diagnosis not present

## 2018-12-10 DIAGNOSIS — J45909 Unspecified asthma, uncomplicated: Secondary | ICD-10-CM | POA: Diagnosis not present

## 2018-12-10 DIAGNOSIS — R509 Fever, unspecified: Secondary | ICD-10-CM | POA: Diagnosis present

## 2018-12-10 DIAGNOSIS — Z951 Presence of aortocoronary bypass graft: Secondary | ICD-10-CM | POA: Diagnosis not present

## 2018-12-10 DIAGNOSIS — Z7984 Long term (current) use of oral hypoglycemic drugs: Secondary | ICD-10-CM | POA: Insufficient documentation

## 2018-12-10 DIAGNOSIS — I5022 Chronic systolic (congestive) heart failure: Secondary | ICD-10-CM | POA: Diagnosis not present

## 2018-12-10 DIAGNOSIS — I110 Hypertensive heart disease with heart failure: Secondary | ICD-10-CM | POA: Insufficient documentation

## 2018-12-10 LAB — URINALYSIS, ROUTINE W REFLEX MICROSCOPIC
Bacteria, UA: NONE SEEN
Bilirubin Urine: NEGATIVE
Glucose, UA: NEGATIVE mg/dL
Hgb urine dipstick: NEGATIVE
Ketones, ur: NEGATIVE mg/dL
Leukocytes,Ua: NEGATIVE
Nitrite: NEGATIVE
Protein, ur: 100 mg/dL — AB
Specific Gravity, Urine: 1.019 (ref 1.005–1.030)
pH: 7 (ref 5.0–8.0)

## 2018-12-10 LAB — CBC WITH DIFFERENTIAL/PLATELET
Abs Immature Granulocytes: 0.03 10*3/uL (ref 0.00–0.07)
Basophils Absolute: 0 10*3/uL (ref 0.0–0.1)
Basophils Relative: 0 %
Eosinophils Absolute: 0 10*3/uL (ref 0.0–0.5)
Eosinophils Relative: 0 %
HCT: 36 % (ref 36.0–46.0)
Hemoglobin: 11.4 g/dL — ABNORMAL LOW (ref 12.0–15.0)
Immature Granulocytes: 1 %
Lymphocytes Relative: 18 %
Lymphs Abs: 1.2 10*3/uL (ref 0.7–4.0)
MCH: 24.5 pg — ABNORMAL LOW (ref 26.0–34.0)
MCHC: 31.7 g/dL (ref 30.0–36.0)
MCV: 77.3 fL — ABNORMAL LOW (ref 80.0–100.0)
Monocytes Absolute: 0.5 10*3/uL (ref 0.1–1.0)
Monocytes Relative: 7 %
Neutro Abs: 4.9 10*3/uL (ref 1.7–7.7)
Neutrophils Relative %: 74 %
Platelets: 167 10*3/uL (ref 150–400)
RBC: 4.66 MIL/uL (ref 3.87–5.11)
RDW: 16.4 % — ABNORMAL HIGH (ref 11.5–15.5)
WBC: 6.6 10*3/uL (ref 4.0–10.5)
nRBC: 0 % (ref 0.0–0.2)

## 2018-12-10 LAB — COMPREHENSIVE METABOLIC PANEL
ALT: 41 U/L (ref 0–44)
AST: 38 U/L (ref 15–41)
Albumin: 3.2 g/dL — ABNORMAL LOW (ref 3.5–5.0)
Alkaline Phosphatase: 60 U/L (ref 38–126)
Anion gap: 10 (ref 5–15)
BUN: 18 mg/dL (ref 8–23)
CO2: 23 mmol/L (ref 22–32)
Calcium: 8.6 mg/dL — ABNORMAL LOW (ref 8.9–10.3)
Chloride: 102 mmol/L (ref 98–111)
Creatinine, Ser: 0.81 mg/dL (ref 0.44–1.00)
GFR calc Af Amer: >60 mL/min (ref >60)
GFR calc non Af Amer: >60 mL/min (ref >60)
Glucose, Bld: 136 mg/dL — ABNORMAL HIGH (ref 70–99)
Potassium: 4.2 mmol/L (ref 3.5–5.1)
Sodium: 135 mmol/L (ref 135–145)
Total Bilirubin: 1.1 mg/dL (ref 0.3–1.2)
Total Protein: 7.5 g/dL (ref 6.5–8.1)

## 2018-12-10 LAB — LACTIC ACID, PLASMA: Lactic Acid, Venous: 1.5 mmol/L (ref 0.5–1.9)

## 2018-12-10 MED ORDER — LEVOFLOXACIN 750 MG PO TABS
750.0000 mg | ORAL_TABLET | Freq: Once | ORAL | Status: AC
  Administered 2018-12-10: 750 mg via ORAL
  Filled 2018-12-10: qty 1

## 2018-12-10 MED ORDER — LEVOFLOXACIN 500 MG PO TABS: 500.0000 mg | ORAL_TABLET | Freq: Every day | ORAL | 0 refills | Status: DC

## 2018-12-10 MED ORDER — SODIUM CHLORIDE 0.9% FLUSH: 3.0000 mL | Freq: Once | INTRAVENOUS | Status: DC

## 2018-12-10 NOTE — ED Notes (Signed)
Patient verbalizes understanding of discharge instructions. Opportunity for questioning and answers were provided. Armband removed by staff, pt discharged from ED

## 2018-12-10 NOTE — ED Provider Notes (Signed)
[facility] Provider Note   CSN: [id number] Arrival date & time: 12/10/18  0802     History   Chief Complaint Chief Complaint  Patient presents with  . Fever    HPI Sharon Spears is a 73 y.o. female.     HPI Patient presents with concern of fever, nausea, generalized discomfort. Patient has multiple medical issues, states that she takes her medication as directed. She also notes that over the past 2 weeks or so she has had intermittent fever, sore throat, nausea, without vomiting, without diarrhea, without abdominal pain, chest pain. She has been seen in urgent care, has started on doxycycline, which not appreciably change in her condition. Prior to the onset of this illness she was in her usual state of health, denies recent medication change, diet change, activity change. Past Medical History:  Diagnosis Date  . Adjustment disorder with anxiety   . Asthma   . Atrial fibrillation ([place])   . Benign essential hypertension   . Coronary artery disease   . Diabetic neuropathic arthritis ([place])   . Heart palpitations   . Hypoxia 07/19/2018  . Other cirrhosis of liver ([place]) 07/20/2018  . Uncontrolled diabetes mellitus type 2 without complications     Patient Active Problem List   Diagnosis Date Noted  . Thyroid mass 12/01/2018  . Renal mass 12/01/2018  . Abnormal findings on diagnostic imaging of lung 12/01/2018  . Complex care coordination 12/01/2018  . Other cirrhosis of liver ([place]) 07/20/2018  . Unspecified atrial fibrillation ([place]) 07/20/2018  . Acute respiratory failure with hypoxemia ([place]) 07/19/2018  . Asthma 06/12/2018  . Cough 02/08/2018  . Shortness of breath 02/08/2018  . Acute on chronic left systolic heart failure ([place]) 09/25/2017  . Thrombocytopenia ([place]) 03/20/2015  . Diabetes mellitus type 2, controlled ([place]) 03/20/2015  . HTN (hypertension) 03/20/2015  . S/P CABG (coronary artery bypass graft) 03/20/2015     Past Surgical History:  Procedure Laterality Date  . CARDIAC SURGERY    . CESAREAN SECTION    . CORONARY ARTERY BYPASS GRAFT  07/06/2017   Redo-sternotomy and CABG 2 with LIMA to LAD, SVG to OM, and a a closure, mitral valve repair with #30 mm [place] ring on 07/06/2017  . ESOPHAGEAL MANOMETRY N/A 03/06/2012   Procedure: ESOPHAGEAL MANOMETRY (EM);  Surgeon: Beryle Beams, MD;  Location: [facility];  Service: Endoscopy;  Laterality: N/A;  . TONSILLECTOMY       OB History   No obstetric history on file.      Home Medications    Prior to Admission medications   Medication Sig Start Date End Date Taking? Authorizing Provider  acetaminophen (TYLENOL) 325 MG tablet Take 2 tablets (650 mg total) by mouth every 6 (six) hours as needed for fever or headache. 10/31/18   Horton, Barbette Hair, MD  albuterol (PROVENTIL) (2.5 MG/3ML) 0.083% nebulizer solution Take 3 mLs (2.5 mg total) by nebulization every 6 (six) hours as needed for wheezing or shortness of breath. 06/12/18   Fenton Foy, NP  albuterol (VENTOLIN HFA) 108 (90 Base) MCG/ACT inhaler Inhale 2 puffs into the lungs every 6 (six) hours as needed for wheezing. 06/12/18   Fenton Foy, NP  apixaban (ELIQUIS) 5 MG TABS tablet Take 1 tablet (5 mg total) by mouth 2 (two) times daily. Patient taking differently: Take 5 mg by mouth 2 (two) times daily. STOPPED TEMPORARILY, INTERACTION WITH ANTIBIOTIC 09/28/17   Charolette Forward, MD  ASPIRIN 81 81 MG  EC tablet Take 81 mg by mouth. occas 07/24/18   [provider]  cetirizine (ZYRTEC) 10 MG tablet Take 1 tablet (10 mg total) by mouth daily. Patient taking differently: Take 10 mg by mouth as needed.  10/26/18   Jaynee Eagles, PA-C  doxycycline (VIBRA-TABS) 100 MG tablet Take 1 tablet (100 mg total) by mouth 2 (two) times daily. 12/01/18   Lauraine Rinne, NP  famotidine (PEPCID) 20 MG tablet Take 20 mg by mouth daily before breakfast.  02/04/15   [provider]  ferrous sulfate  325 (65 FE) MG tablet Take 1 tablet (325 mg total) by mouth 3 (three) times daily with meals. Patient taking differently: Take 325 mg by mouth daily.  09/28/17   Charolette Forward, MD  fluticasone (FLONASE) 50 MCG/ACT nasal spray Place 2 sprays into both nostrils 2 (two) times daily. Patient taking differently: Place 2 sprays into both nostrils as needed.  05/04/18   Lamptey, Myrene Galas, MD  furosemide (LASIX) 20 MG tablet Take 1 tablet (20 mg total) by mouth 2 (two) times daily. 07/21/18   Samuella Cota, MD  isosorbide mononitrate (IMDUR) 30 MG 24 hr tablet Take 1 tablet by mouth as needed.  07/12/18   [provider]  losartan (COZAAR) 100 MG tablet Take 1 tablet by mouth daily. 08/16/18   [provider]  magic mouthwash w/lidocaine SOLN Take 5 mLs by mouth 3 (three) times daily as needed for mouth pain. 12/05/18   Wieters, Hallie C, PA-C  metFORMIN (GLUCOPHAGE XR) 500 MG 24 hr tablet Take 1 tablet (500 mg total) by mouth daily with breakfast. Patient taking differently: Take 500 mg by mouth 2 (two) times daily.  06/02/16   Golden Circle, FNP  montelukast (SINGULAIR) 10 MG tablet Take 1 tablet (10 mg total) by mouth at bedtime. Patient taking differently: Take 10 mg by mouth as needed.  06/12/18   Fenton Foy, NP  Multiple Vitamin (MULTIVITAMIN WITH MINERALS) TABS tablet Take 1 tablet by mouth daily. Patient taking differently: Take 1 tablet by mouth. occas 09/28/17   Charolette Forward, MD  nitroGLYCERIN (NITROSTAT) 0.4 MG SL tablet Place 0.4 mg under the tongue every 5 (five) minutes as needed for chest pain.     [provider]  Olopatadine HCl 0.2 % SOLN Apply   into affected eye  as needed 07/24/18   [provider]  ondansetron (ZOFRAN ODT) 4 MG disintegrating tablet Take 1 tablet (4 mg total) by mouth every 8 (eight) hours as needed for nausea or vomiting. 12/05/18   Wieters, Hallie C, PA-C  predniSONE (DELTASONE) 10 MG tablet 4 tabs for 2 days, then 3 tabs for  2 days, 2 tabs for 2 days, then 1 tab for 2 days, then stop 12/01/18   Lauraine Rinne, NP  traMADol (ULTRAM) 50 MG tablet as needed. 10/23/18   [provider]    Family History Family History  Problem Relation Age of Onset  . Asthma Mother   . Alcohol abuse Father     Social History Social History   Tobacco Use  . Smoking status: Never Smoker  . Smokeless tobacco: Never Used  Substance Use Topics  . Alcohol use: No  . Drug use: Never   From [place], raised in [place], now living in [place].  Allergies   Contrast media [iodinated diagnostic agents], Adhesive [tape], and Penicillins   Review of Systems Review of Systems  Constitutional:  Per HPI, otherwise negative  HENT:       Per HPI, otherwise negative  Respiratory:       Per HPI, otherwise negative  Cardiovascular:       Per HPI, otherwise negative  Gastrointestinal: Negative for vomiting.  Endocrine:       Negative aside from HPI  Genitourinary:       Neg aside from HPI   Musculoskeletal:       Per HPI, otherwise negative  Skin: Negative.   Neurological: Negative for syncope.     Physical Exam Updated Vital Signs BP (!) 141/79   Pulse 74   Temp 98.3 F (36.8 C) (Oral)   Resp 18   SpO2 98%   Physical Exam Vitals signs and nursing note reviewed.  Constitutional:      General: She is not in acute distress.    Appearance: She is well-developed.  HENT:     Head: Normocephalic and atraumatic.  Eyes:     Conjunctiva/sclera: Conjunctivae normal.  Cardiovascular:     Rate and Rhythm: Normal rate and regular rhythm.  Pulmonary:     Effort: Pulmonary effort is normal. No respiratory distress.     Breath sounds: Normal breath sounds. No stridor.  Abdominal:     General: There is no distension.  Skin:    General: Skin is warm and dry.  Neurological:     Mental Status: She is alert and oriented to person, place, and time.     Cranial Nerves: No cranial nerve deficit.       ED Treatments / Results  Labs (all labs ordered are listed, but only abnormal results are displayed) Labs Reviewed  COMPREHENSIVE METABOLIC PANEL - Abnormal; Notable for the following components:      Result Value   Glucose, Bld 136 (*)    Calcium 8.6 (*)    Albumin 3.2 (*)    All other components within normal limits  CBC WITH DIFFERENTIAL/PLATELET - Abnormal; Notable for the following components:   Hemoglobin 11.4 (*)    MCV 77.3 (*)    MCH 24.5 (*)    RDW 16.4 (*)    All other components within normal limits  URINALYSIS, ROUTINE W REFLEX MICROSCOPIC - Abnormal; Notable for the following components:   Protein, ur 100 (*)    All other components within normal limits  LACTIC ACID, PLASMA  LACTIC ACID, PLASMA     Radiology Dg Chest 2 View  Result Date: 12/10/2018 CLINICAL DATA:  Fever EXAM: CHEST - 2 VIEW COMPARISON:  October 30, 2018 chest radiograph and chest CT November 23, 2018 FINDINGS: There is a focal area of airspace opacity in the left mid lung, concerning for pneumonia. There is a persistent left pleural effusion with areas of scarring and atelectasis in the lung bases. There is cardiomegaly with pulmonary vascularity normal. No adenopathy. There is postoperative change in the mediastinum, stable. No adenopathy. No bone lesions. IMPRESSION: Small area of airspace opacity in the left mid lung, consistent with focal pneumonia. Persistent left pleural effusion with scarring and atelectasis in the lung bases. Stable cardiomegaly. Electronically Signed   By: Lowella Grip III M.D.   On: 12/10/2018 08:43    Procedures Procedures (including critical care time)  Medications Ordered in ED Medications  sodium chloride flush (NS) 0.9 % injection 3 mL (3 mLs Intravenous Not Given 12/10/18 1118)  levofloxacin (LEVAQUIN) tablet 500 mg (has no administration in time range)     Initial Impression / Assessment and Plan /  ED Course  I have reviewed the triage vital signs and  the nursing notes.  Pertinent labs & imaging results that were available during my care of the patient were reviewed by me and considered in my medical decision making (see chart for details).    11:44 AM Patient in no distress, has eaten something, without complication, has no hypoxia, no tachycardia, tachypnea. Findings generally reassuring aside from x-ray concerning for pneumonia. This would explain the patient's intermittent fever, generalized discomfort, though she does not describe substantial respiratory complaints. Patient is on doxycycline, but this is likely insufficient for appropriate antibiotic coverage for pneumonia. Patient amenable to starting additional antibiotics here, discharged with close outpatient follow-up.  Absent evidence for bacteremia, sepsis, distress, hemodynamic instability, patient is appropriate for discharge.  Final Clinical Impressions(s) / ED Diagnoses   Final diagnoses:  Community acquired pneumonia of left lower lobe of lung    ED Discharge Orders         Ordered    levofloxacin (LEVAQUIN) 500 MG tablet  Daily     12/10/18 1147           Carmin Muskrat, MD 12/10/18 1147

## 2018-12-10 NOTE — ED Triage Notes (Signed)
Pt seen at [facility] and PCP for same. Pt states she has had a fever for about a month. Had Covid tests that were negative. States she has frequent urination and cannot control her bladder. Pt also endorses a sore throat and blisters on the tongue.

## 2018-12-10 NOTE — Discharge Instructions (Signed)
As discussed, your evaluation today has been largely reassuring.  But, it is important that you monitor your condition carefully, and do not hesitate to return to the ED if you develop new, or concerning changes in your condition.  You have been diagnosed with pneumonia, and are starting a course of antibiotics.  Please follow-up with your physician for appropriate ongoing care.

## 2018-12-11 ENCOUNTER — Other Ambulatory Visit: Payer: Self-pay

## 2018-12-11 ENCOUNTER — Ambulatory Visit (INDEPENDENT_AMBULATORY_CARE_PROVIDER_SITE_OTHER): Payer: Medicare Other | Admitting: Cardiology

## 2018-12-11 DIAGNOSIS — I25118 Atherosclerotic heart disease of native coronary artery with other forms of angina pectoris: Secondary | ICD-10-CM | POA: Diagnosis not present

## 2018-12-11 DIAGNOSIS — R0609 Other forms of dyspnea: Secondary | ICD-10-CM

## 2018-12-11 DIAGNOSIS — I4821 Permanent atrial fibrillation: Secondary | ICD-10-CM | POA: Diagnosis not present

## 2018-12-11 DIAGNOSIS — Z951 Presence of aortocoronary bypass graft: Secondary | ICD-10-CM

## 2018-12-11 DIAGNOSIS — Z9889 Other specified postprocedural states: Secondary | ICD-10-CM
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
## 2018-12-13 ENCOUNTER — Telehealth: Payer: Self-pay | Admitting: Pulmonary Disease

## 2018-12-13 ENCOUNTER — Encounter: Payer: Self-pay | Admitting: Pulmonary Disease

## 2018-12-13 ENCOUNTER — Ambulatory Visit (INDEPENDENT_AMBULATORY_CARE_PROVIDER_SITE_OTHER): Payer: Medicare Other | Admitting: Pulmonary Disease

## 2018-12-13 ENCOUNTER — Other Ambulatory Visit: Payer: Self-pay

## 2018-12-13 VITALS — Temp 97.2°F | Ht 61.0 in | Wt 142.0 lb

## 2018-12-13 DIAGNOSIS — J189 Pneumonia, unspecified organism: Secondary | ICD-10-CM

## 2018-12-13 DIAGNOSIS — E079 Disorder of thyroid, unspecified: Secondary | ICD-10-CM | POA: Diagnosis not present

## 2018-12-13 DIAGNOSIS — F339 Major depressive disorder, recurrent, unspecified: Secondary | ICD-10-CM

## 2018-12-13 DIAGNOSIS — J4540 Moderate persistent asthma, uncomplicated: Secondary | ICD-10-CM

## 2018-12-13 DIAGNOSIS — N2889 Other specified disorders of kidney and ureter: Secondary | ICD-10-CM

## 2018-12-13 NOTE — Telephone Encounter (Signed)
LMTCB  Unfortunatley he is not on the dpr to discuss any health information. Unless he is with the patient and she can give a verbal until we have her add him to the dpr no information can be discussed.

## 2018-12-13 NOTE — Progress Notes (Signed)
Synopsis: Referred in Jan. 2020 for coughing, pain, headache by Sharon Forward, MD  Subjective:   PATIENT ID: Sharon Spears GENDER: female DOB: 1945-07-30, MRN: [id number]  Chief Complaint  Patient presents with  . Follow-up    This is a 73 year old with complaints of fever and body aches.  She was recently treated with an upper respiratory tract infection approximately month ago.  She was seen in the emergency room.  She was referred to the pulmonologist office for respiratory complaints following this.  She does have known congestive heart failure.  She is a lifelong non-smoker.  But she did spend nearly 50 years in [place].  She is concerned for air pollution and smog affect to her pulmonary function.  Over the past several weeks she has had progressive dyspnea.  She states that she has been compliant with her congestive heart failure regimen.  She has noticed some mild increase in the swelling of her legs.  She recently followed up with her cardiologist last week.  She does have cough that is productive at times, chest tightness, denies hemoptysis.  OV 09/05/2018: Since last office visit patient has had multiple episodes of recurrent cough and congestion.  She has been seen by 1 of our nurse practitioners in March, ER April x2, office June, ER July for all similar symptoms.  Called into the office again on August 21 with complaints of congestion.  Chest imaging in June stable consistent with prior CABG and sternotomy. Chest x-ray in July with basilar atelectasis in the left pleural effusion.  Echocardiogram in July 2020 with a depressed ejection fraction 45 to 50%.  Left ventricular hypokinesis. She wakes up every night with coughing symptoms. At night time she had worse symptoms.  He has ongoing nocturnal wheezing.  She always feels like she has a tickle in her throat.  During the daytime is better.  Nighttime is much worse.  She will occasionally steam a pot of hot water and breathe in  steam in the middle of the night which helps soothe her coughing spells.  OV 11/01/2018: Patient last seen in the office in August.  Unfortunately recently had an exacerbation requiring antibiotics.  Possible viral URI symptoms.  She was treated with antibiotics.  She also had a migraine was seen in the emergency room this was also treated.  She was Covid tested which was negative.  She is followed by cardiology for chronic systolic heart failure.  She takes her medications regularly.  Has a history of asthma.  Uses her Symbicort daily.  Her symptoms at this point are better.  We reviewed her pulmonary function test that were completed back in January.  We looked at her chest x-ray that was recently completed in the ER.  She still has some left-sided elevated hemidiaphragm plus scarring in the left base.  History of CABG.  OV 12/13/2018: This is Sharon Spears here today for follow-up after recent ER visit.  Unfortunately found to have left upper lobe infiltrate.  Was treated for community-acquired pneumonia and given Levaquin.  Overall at this time she is breathing much better.  She denies any fevers chills night sweats weight loss.  No cough no sputum production.  She is sleeping better.  She was given doxycycline and prednisone via telemetry visit with Sharon Quaker, NP.  Patient unfortunately had insomnia.  She thinks this was related to the doxycycline however more likely related to the prednisone dosing.  However she is breathing better.  She does present  very tearful today and very sad that she is unable to visit with family.  She has a daughter in [place] who is a Marine scientist and a son who is a [place] [employer] in [place].  She spends most of the time alone and she is very sad about not being able to be with people.  But she understands the importance of this right now.       Past Medical History:  Diagnosis Date  . Adjustment disorder with anxiety   . Asthma   . Atrial fibrillation ([place])   . Benign  essential hypertension   . Coronary artery disease   . Diabetic neuropathic arthritis ([place])   . Heart palpitations   . Hypoxia 07/19/2018  . Other cirrhosis of liver ([place]) 07/20/2018  . Uncontrolled diabetes mellitus type 2 without complications      Family History  Problem Relation Age of Onset  . Asthma Mother   . Alcohol abuse Father      Past Surgical History:  Procedure Laterality Date  . CARDIAC SURGERY    . CESAREAN SECTION    . CORONARY ARTERY BYPASS GRAFT  07/06/2017   Redo-sternotomy and CABG 2 with LIMA to LAD, SVG to OM, and a a closure, mitral valve repair with #30 mm [place] ring on 07/06/2017  . ESOPHAGEAL MANOMETRY N/A 03/06/2012   Procedure: ESOPHAGEAL MANOMETRY (EM);  Surgeon: Beryle Beams, MD;  Location: [facility];  Service: Endoscopy;  Laterality: N/A;  . TONSILLECTOMY      Social History   Socioeconomic History  . Marital status: Legally Separated    Spouse name: Not on file  . Number of children: 4  . Years of education: 52  . Highest education level: Not on file  Occupational History  . Occupation: Retired  Scientific laboratory technician  . Financial resource strain: Not on file  . Food insecurity    Worry: Not on file    Inability: Not on file  . Transportation needs    Medical: Not on file    Non-medical: Not on file  Tobacco Use  . Smoking status: Never Smoker  . Smokeless tobacco: Never Used  Substance and Sexual Activity  . Alcohol use: No  . Drug use: Never  . Sexual activity: Yes  Lifestyle  . Physical activity    Days per week: Not on file    Minutes per session: Not on file  . Stress: Not on file  Relationships  . Social Herbalist on phone: Not on file    Gets together: Not on file    Attends religious service: Not on file    Active member of club or organization: Not on file    Attends meetings of clubs or organizations: Not on file    Relationship status: Not on file  . Intimate partner violence    Fear of current or ex  partner: Not on file    Emotionally abused: Not on file    Physically abused: Not on file    Forced sexual activity: Not on file  Other Topics Concern  . Not on file  Social History Narrative   Fun/Hobby: Gardening    Denies abuse and feels safe at home.    1 son deceased     Allergies  Allergen Reactions  . Contrast Media [Iodinated Diagnostic Agents] Anaphylaxis    Per pt she had anaphylaxis reaction to contrast media in the past. States she couldn't breathe and they had to give  her epi.   . Adhesive [Tape] Itching  . Penicillins Other (See Comments)    GI upset Has patient had a PCN reaction causing immediate rash, facial/tongue/throat swelling, SOB or lightheadedness with hypotension: No Has patient had a PCN reaction causing severe rash involving mucus membranes or skin necrosis: No Has patient had a PCN reaction that required hospitalization: No Has patient had a PCN reaction occurring within the last 10 years: No If all of the above answers are "NO", then may proceed with Cephalosporin use.     Outpatient Medications Prior to Visit  Medication Sig Dispense Refill  . acetaminophen (TYLENOL) 325 MG tablet Take 2 tablets (650 mg total) by mouth every 6 (six) hours as needed for fever or headache. 30 tablet 0  . albuterol (PROVENTIL) (2.5 MG/3ML) 0.083% nebulizer solution Take 3 mLs (2.5 mg total) by nebulization every 6 (six) hours as needed for wheezing or shortness of breath. 75 mL 12  . albuterol (VENTOLIN HFA) 108 (90 Base) MCG/ACT inhaler Inhale 2 puffs into the lungs every 6 (six) hours as needed for wheezing. 3 Inhaler 1  . apixaban (ELIQUIS) 5 MG TABS tablet Take 1 tablet (5 mg total) by mouth 2 (two) times daily. (Patient taking differently: Take 5 mg by mouth 2 (two) times daily. STOPPED TEMPORARILY, INTERACTION WITH ANTIBIOTIC) 60 tablet 3  . ASPIRIN 81 81 MG EC tablet Take 81 mg by mouth. occas    . cetirizine (ZYRTEC) 10 MG tablet Take 1 tablet (10 mg total) by mouth  daily. (Patient taking differently: Take 10 mg by mouth as needed. ) 30 tablet 0  . doxycycline (VIBRA-TABS) 100 MG tablet Take 1 tablet (100 mg total) by mouth 2 (two) times daily. 14 tablet 0  . famotidine (PEPCID) 20 MG tablet Take 20 mg by mouth daily before breakfast.   3  . ferrous sulfate 325 (65 FE) MG tablet Take 1 tablet (325 mg total) by mouth 3 (three) times daily with meals. (Patient taking differently: Take 325 mg by mouth daily. ) 90 tablet 3  . fluticasone (FLONASE) 50 MCG/ACT nasal spray Place 2 sprays into both nostrils 2 (two) times daily. (Patient taking differently: Place 2 sprays into both nostrils as needed. ) 1 g 2  . furosemide (LASIX) 20 MG tablet Take 1 tablet (20 mg total) by mouth 2 (two) times daily.    . isosorbide mononitrate (IMDUR) 30 MG 24 hr tablet Take 1 tablet by mouth as needed.     Marland Kitchen levofloxacin (LEVAQUIN) 500 MG tablet Take 1 tablet (500 mg total) by mouth daily. 7 tablet 0  . losartan (COZAAR) 100 MG tablet Take 1 tablet by mouth daily.    . magic mouthwash w/lidocaine SOLN Take 5 mLs by mouth 3 (three) times daily as needed for mouth pain. 75 mL 0  . metFORMIN (GLUCOPHAGE XR) 500 MG 24 hr tablet Take 1 tablet (500 mg total) by mouth daily with breakfast. (Patient taking differently: Take 500 mg by mouth 2 (two) times daily. ) 30 tablet 2  . montelukast (SINGULAIR) 10 MG tablet Take 1 tablet (10 mg total) by mouth at bedtime. (Patient taking differently: Take 10 mg by mouth as needed. ) 30 tablet 11  . Multiple Vitamin (MULTIVITAMIN WITH MINERALS) TABS tablet Take 1 tablet by mouth daily. (Patient taking differently: Take 1 tablet by mouth. occas) 30 tablet 3  . nitroGLYCERIN (NITROSTAT) 0.4 MG SL tablet Place 0.4 mg under the tongue every 5 (five) minutes as needed for  chest pain.     Marland Kitchen Olopatadine HCl 0.2 % SOLN Apply   into affected eye  as needed    . ondansetron (ZOFRAN ODT) 4 MG disintegrating tablet Take 1 tablet (4 mg total) by mouth every 8 (eight)  hours as needed for nausea or vomiting. 20 tablet 0  . predniSONE (DELTASONE) 10 MG tablet 4 tabs for 2 days, then 3 tabs for 2 days, 2 tabs for 2 days, then 1 tab for 2 days, then stop 20 tablet 0  . traMADol (ULTRAM) 50 MG tablet as needed.     No facility-administered medications prior to visit.     Review of Systems  Constitutional: Negative for chills, fever, malaise/fatigue and weight loss.  HENT: Negative for hearing loss, sore throat and tinnitus.   Eyes: Negative for blurred vision and double vision.  Respiratory: Positive for shortness of breath. Negative for cough, hemoptysis, sputum production, wheezing and stridor.   Cardiovascular: Negative for chest pain, palpitations, orthopnea, leg swelling and PND.  Gastrointestinal: Negative for abdominal pain, constipation, diarrhea, heartburn, nausea and vomiting.  Genitourinary: Negative for dysuria, hematuria and urgency.  Musculoskeletal: Negative for joint pain and myalgias.  Skin: Negative for itching and rash.  Neurological: Negative for dizziness, tingling, weakness and headaches.  Endo/Heme/Allergies: Negative for environmental allergies. Does not bruise/bleed easily.  Psychiatric/Behavioral: Negative for depression. The patient is not nervous/anxious and does not have insomnia.   All other systems reviewed and are negative.    Objective:  Physical Exam Vitals signs reviewed.  Constitutional:      General: She is not in acute distress.    Appearance: She is well-developed.  HENT:     Head: Normocephalic and atraumatic.  Eyes:     General: No scleral icterus.    Conjunctiva/sclera: Conjunctivae normal.     Pupils: Pupils are equal, round, and reactive to light.  Neck:     Musculoskeletal: Neck supple.     Vascular: No JVD.     Trachea: No tracheal deviation.  Cardiovascular:     Rate and Rhythm: Normal rate and regular rhythm.     Heart sounds: Normal heart sounds. No murmur.  Pulmonary:     Effort: Pulmonary  effort is normal. No tachypnea, accessory muscle usage or respiratory distress.     Breath sounds: No stridor. No wheezing, rhonchi or rales.     Comments: Diminished breath sounds bilaterally no crackles no wheeze Abdominal:     General: Bowel sounds are normal. There is no distension.     Palpations: Abdomen is soft.     Tenderness: There is no abdominal tenderness.  Musculoskeletal:        General: No tenderness.  Lymphadenopathy:     Cervical: No cervical adenopathy.  Skin:    General: Skin is warm and dry.     Capillary Refill: Capillary refill takes less than 2 seconds.     Findings: No rash.  Neurological:     Mental Status: She is alert and oriented to person, place, and time.  Psychiatric:        Behavior: Behavior normal.      Vitals:   12/13/18 1107  Temp: (!) 97.2 F (36.2 C)  TempSrc: Temporal  Weight: 142 lb (64.4 kg)  Height: _0  (1.549 m)     on RA BMI Readings from Last 3 Encounters:  12/13/18 26.83 kg/m  11/13/18 27.83 kg/m  11/01/18 28.51 kg/m   Wt Readings from Last 3 Encounters:  12/13/18 142 lb (64.4  kg)  11/13/18 147 lb 4.8 oz (66.8 kg)  11/01/18 146 lb (66.2 kg)     CBC    Component Value Date/Time   WBC 6.6 12/10/2018 0829   RBC 4.66 12/10/2018 0829   HGB 11.4 (L) 12/10/2018 0829   HCT 36.0 12/10/2018 0829   PLT 167 12/10/2018 0829   MCV 77.3 (L) 12/10/2018 0829   MCH 24.5 (L) 12/10/2018 0829   MCHC 31.7 12/10/2018 0829   RDW 16.4 (H) 12/10/2018 0829   LYMPHSABS 1.2 12/10/2018 0829   MONOABS 0.5 12/10/2018 0829   EOSABS 0.0 12/10/2018 0829   BASOSABS 0.0 12/10/2018 0829    Chest Imaging: CXR - 01/21/2018 IMPRESSION: Stable cardiomegaly and left greater than right basilar scarring. No active disease. The patient's images have been independently reviewed by me.   Chest x-ray June and July: Consistent with prior median sternotomy history of CABG. July chest x-ray with left-sided pleural effusion.  Bibasilar atelectasis  The patient's images have been independently reviewed by me.   Chest x-ray October 2020: Mild cardiomegaly, basilar scarring in the left.  Unclear to me if there is a effusion on the left side. The patient's images have been independently reviewed by me  HRCT chest 11/23/2018: Lower lobe scarring parenchymal banding.  Known previous sternotomy. Small left pleural effusion Hemorrhagic lesion within the left kidney, large 3.2 x 2.9 cm thyroid gland the lesion. The patient's images have been independently reviewed by me.    Chest x-ray 12/10/2018: Left upper lobe infiltrate The patient's images have been independently reviewed by me.       Pulmonary Functions Testing Results: PFT Results Latest Ref Rng & Units 02/08/2018  FVC-Pre L 1.46  FVC-Predicted Pre % 58  FVC-Post L 1.46  FVC-Predicted Post % 58  Pre FEV1/FVC % % 80  Post FEV1/FCV % % 82  FEV1-Pre L 1.17  FEV1-Predicted Pre % 62  FEV1-Post L 1.19  DLCO UNC% % 67  DLCO COR %Predicted % 112  TLC L 3.41  TLC % Predicted % 76  RV % Predicted % 92    FeNO: None   Pathology: None   Echocardiogram:   Echocardiogram July 2020: Depressed ejection fraction 45 to 50%, left ventricular hypokinesis.  Elevated PA pressures  Heart Catheterization: no reports I can see    Assessment & Plan:     ICD-10-CM   1. Moderate persistent asthma, unspecified whether complicated  E95.28   2. Thyroid mass  E07.9   3. Renal mass  N28.89   4. Community acquired pneumonia of left upper lobe of lung  J18.9 Chest X-ray 2 View  5. Episode of recurrent major depressive disorder, unspecified depression episode severity ([place])  F33.9     Discussion:  This is a 73 year old with a history of moderate persistent asthma.  Recent emergency room visit for left upper lobe pneumonia.  Treated with levofloxacin.  At this time doing well.  Recent HRCT imaging of the chest with no evidence of ILD however we did find some lower lobe likely post  inflammatory scarring.  She does have a hemorrhagic lesion within the left kidney as well as a thyroid gland lesion. Plan: Referrals have already been placed to urology as well as ear nose and throat. I have encouraged her to follow-up with this.  We will also have our PCC's reach out to her to help coordinate scheduling for these appointments. She can continue her current inhaler regimen, Symbicort 160, 2 puffs twice daily She needs to complete  her course of levofloxacin.  She can return to clinic in 6 to 8 weeks with a repeat two-view chest x-ray.  This has been ordered prior to the office visit.  Greater than 50% of this patient's 25-minute office visit face-to-face discussing above recommendations and treatment plan.  As well as review of previous CT imaging and chest x-ray imaging from the emergency room.  Current Outpatient Medications:  .  acetaminophen (TYLENOL) 325 MG tablet, Take 2 tablets (650 mg total) by mouth every 6 (six) hours as needed for fever or headache., Disp: 30 tablet, Rfl: 0 .  albuterol (PROVENTIL) (2.5 MG/3ML) 0.083% nebulizer solution, Take 3 mLs (2.5 mg total) by nebulization every 6 (six) hours as needed for wheezing or shortness of breath., Disp: 75 mL, Rfl: 12 .  albuterol (VENTOLIN HFA) 108 (90 Base) MCG/ACT inhaler, Inhale 2 puffs into the lungs every 6 (six) hours as needed for wheezing., Disp: 3 Inhaler, Rfl: 1 .  apixaban (ELIQUIS) 5 MG TABS tablet, Take 1 tablet (5 mg total) by mouth 2 (two) times daily. (Patient taking differently: Take 5 mg by mouth 2 (two) times daily. STOPPED TEMPORARILY, INTERACTION WITH ANTIBIOTIC), Disp: 60 tablet, Rfl: 3 .  ASPIRIN 81 81 MG EC tablet, Take 81 mg by mouth. occas, Disp: , Rfl:  .  cetirizine (ZYRTEC) 10 MG tablet, Take 1 tablet (10 mg total) by mouth daily. (Patient taking differently: Take 10 mg by mouth as needed. ), Disp: 30 tablet, Rfl: 0 .  doxycycline (VIBRA-TABS) 100 MG tablet, Take 1 tablet (100 mg total) by mouth  2 (two) times daily., Disp: 14 tablet, Rfl: 0 .  famotidine (PEPCID) 20 MG tablet, Take 20 mg by mouth daily before breakfast. , Disp: , Rfl: 3 .  ferrous sulfate 325 (65 FE) MG tablet, Take 1 tablet (325 mg total) by mouth 3 (three) times daily with meals. (Patient taking differently: Take 325 mg by mouth daily. ), Disp: 90 tablet, Rfl: 3 .  fluticasone (FLONASE) 50 MCG/ACT nasal spray, Place 2 sprays into both nostrils 2 (two) times daily. (Patient taking differently: Place 2 sprays into both nostrils as needed. ), Disp: 1 g, Rfl: 2 .  furosemide (LASIX) 20 MG tablet, Take 1 tablet (20 mg total) by mouth 2 (two) times daily., Disp: , Rfl:  .  isosorbide mononitrate (IMDUR) 30 MG 24 hr tablet, Take 1 tablet by mouth as needed. , Disp: , Rfl:  .  levofloxacin (LEVAQUIN) 500 MG tablet, Take 1 tablet (500 mg total) by mouth daily., Disp: 7 tablet, Rfl: 0 .  losartan (COZAAR) 100 MG tablet, Take 1 tablet by mouth daily., Disp: , Rfl:  .  magic mouthwash w/lidocaine SOLN, Take 5 mLs by mouth 3 (three) times daily as needed for mouth pain., Disp: 75 mL, Rfl: 0 .  metFORMIN (GLUCOPHAGE XR) 500 MG 24 hr tablet, Take 1 tablet (500 mg total) by mouth daily with breakfast. (Patient taking differently: Take 500 mg by mouth 2 (two) times daily. ), Disp: 30 tablet, Rfl: 2 .  montelukast (SINGULAIR) 10 MG tablet, Take 1 tablet (10 mg total) by mouth at bedtime. (Patient taking differently: Take 10 mg by mouth as needed. ), Disp: 30 tablet, Rfl: 11 .  Multiple Vitamin (MULTIVITAMIN WITH MINERALS) TABS tablet, Take 1 tablet by mouth daily. (Patient taking differently: Take 1 tablet by mouth. occas), Disp: 30 tablet, Rfl: 3 .  nitroGLYCERIN (NITROSTAT) 0.4 MG SL tablet, Place 0.4 mg under the tongue every 5 (five) minutes  as needed for chest pain. , Disp: , Rfl:  .  Olopatadine HCl 0.2 % SOLN, Apply   into affected eye  as needed, Disp: , Rfl:  .  ondansetron (ZOFRAN ODT) 4 MG disintegrating tablet, Take 1 tablet (4 mg  total) by mouth every 8 (eight) hours as needed for nausea or vomiting., Disp: 20 tablet, Rfl: 0 .  predniSONE (DELTASONE) 10 MG tablet, 4 tabs for 2 days, then 3 tabs for 2 days, 2 tabs for 2 days, then 1 tab for 2 days, then stop, Disp: 20 tablet, Rfl: 0 .  traMADol (ULTRAM) 50 MG tablet, as needed., Disp: , Rfl:    [name], DO [facility] 12/13/2018 11:30 AM

## 2018-12-13 NOTE — Patient Instructions (Addendum)
Thank you for visiting Dr. Valeta Harms at [facility]. Today we recommend the following:  Orders Placed This Encounter  Procedures  . Chest X-ray 2 View   We need to schedule your ENT referral and Urology referral. Repeat CXR in 6-8 weeks Follow up with me after CXR.  Complete current antibiotic regimen.  Return in about 8 weeks (around 02/07/2019) for with APP or Dr. Valeta Harms.    Please do your part to reduce the spread of COVID-19.

## 2018-12-14 NOTE — Telephone Encounter (Signed)
lmtcb for [employer].

## 2018-12-19 ENCOUNTER — Telehealth: Payer: Self-pay | Admitting: Pulmonary Disease

## 2018-12-19 NOTE — Telephone Encounter (Signed)
PCC's can you follow up on appts. I see the referral but there isnt an appt scheduled for both ENT and Urology. Please advise.

## 2018-12-19 NOTE — Telephone Encounter (Signed)
Called and spoke with pt's son Cori Razor. Pt told him after the OV with BI, she was unsure what BI was talking about in regards to her diagnosis and is wanting to have BI call her son.  Cori Razor can be reached at [phone] to have pt's last OV discussed with him. Routing to Dr. Valeta Harms.

## 2018-12-19 NOTE — Telephone Encounter (Signed)
Spoke with pt, she states she would like the covid vaccine so she can travel to [place] to see her kids. She saw on the news that [facility] may already have them. 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 She stated she really needed the vaccine but I advised her to call back to see if we have them. I couldn't assure the length of time. Nothing further is needed.

## 2018-12-19 NOTE — Telephone Encounter (Signed)
PCCM:   I called and spoke with the patients son hector. Someone, please call hector to inform him of the patients up coming appts with ENT and Urology. He would like to be updated with these times. He is in [place] and trying to help his mother coordinate. She can be forgetful at times.   CC: PCCs   [name], DO [place] [facility] 12/19/2018 2:27 PM

## 2018-12-20 NOTE — Telephone Encounter (Signed)
[facility] they have both left messages for this pt to call them to set up these appts' I have called the son and given him both phone numbers to those offices and pt can call them to set up her appt Sharon Spears

## 2018-12-21 ENCOUNTER — Other Ambulatory Visit: Payer: Self-pay | Admitting: Pulmonary Disease

## 2018-12-21 DIAGNOSIS — J4540 Moderate persistent asthma, uncomplicated: Secondary | ICD-10-CM

## 2018-12-22 ENCOUNTER — Telehealth: Payer: Self-pay | Admitting: Pulmonary Disease

## 2018-12-22 ENCOUNTER — Other Ambulatory Visit: Payer: Self-pay | Admitting: Pulmonary Disease

## 2018-12-22 DIAGNOSIS — J4540 Moderate persistent asthma, uncomplicated: Secondary | ICD-10-CM

## 2018-12-22 NOTE — Telephone Encounter (Signed)
Left message for patient to call back.

## 2018-12-25 ENCOUNTER — Other Ambulatory Visit: Payer: Self-pay | Admitting: Otolaryngology

## 2018-12-25 DIAGNOSIS — E079 Disorder of thyroid, unspecified: Secondary | ICD-10-CM

## 2018-12-25 NOTE — Telephone Encounter (Signed)
ATC pt, no answer. Left message for pt to call back.  

## 2018-12-25 NOTE — Telephone Encounter (Signed)
Patient is returning phone call. Patient phone number is [phone]. 

## 2018-12-26 NOTE — Telephone Encounter (Signed)
No she cannot stay on oral prednisone. She needs to use her inhalers. If she is having recurrent symptoms she should have a televisit.   BLI

## 2018-12-26 NOTE — Telephone Encounter (Signed)
Patient states having symptoms of cough and mucus.  Pharmacy is [facility] [place].  Declined televisit.  Patient phone number is [phone].

## 2018-12-26 NOTE — Telephone Encounter (Signed)
Attempted to call pt but unable to reach. Left message for pt to return call. 

## 2018-12-26 NOTE — Telephone Encounter (Signed)
ATC pt x 2. Line rang several times before going to busy signal. WCB.

## 2018-12-26 NOTE — Telephone Encounter (Signed)
Called and spoke with pt who stated she finished the previous prednisone Rx and due to it helping with her cough, she is requesting another Rx of prednisone. Pt stated when she coughs she is coughing up white phlegm.  Pt denies any fever but is unable to check her temp as she does not have a thermometer. Pt said she does not feel feverish. Pt said she has had some headaches.   Due to the prednisone helping, Dr. Valeta Harms please advise if you are okay with [place] sending an Rx for prednisone to pharmacy for pt.

## 2018-12-27 ENCOUNTER — Ambulatory Visit (INDEPENDENT_AMBULATORY_CARE_PROVIDER_SITE_OTHER): Payer: Medicare Other | Admitting: Pulmonary Disease

## 2018-12-27 ENCOUNTER — Encounter: Payer: Self-pay | Admitting: Pulmonary Disease

## 2018-12-27 ENCOUNTER — Other Ambulatory Visit: Payer: Self-pay

## 2018-12-27 DIAGNOSIS — N2889 Other specified disorders of kidney and ureter: Secondary | ICD-10-CM

## 2018-12-27 DIAGNOSIS — Z7189 Other specified counseling: Secondary | ICD-10-CM

## 2018-12-27 DIAGNOSIS — R059 Cough, unspecified: Secondary | ICD-10-CM

## 2018-12-27 DIAGNOSIS — R918 Other nonspecific abnormal finding of lung field: Secondary | ICD-10-CM | POA: Diagnosis not present

## 2018-12-27 DIAGNOSIS — R05 Cough: Secondary | ICD-10-CM

## 2018-12-27 DIAGNOSIS — E079 Disorder of thyroid, unspecified: Secondary | ICD-10-CM

## 2018-12-27 DIAGNOSIS — J4540 Moderate persistent asthma, uncomplicated: Secondary | ICD-10-CM | POA: Diagnosis not present

## 2018-12-27 MED ORDER — AZITHROMYCIN 250 MG PO TABS: ORAL_TABLET | ORAL | 0 refills | Status: DC

## 2018-12-27 MED ORDER — PREDNISONE 10 MG PO TABS: ORAL_TABLET | ORAL | 0 refills | Status: DC

## 2018-12-27 NOTE — Progress Notes (Signed)
PCCM: Thanks for seeing her [name], DO [place] [facility] 12/27/2018 4:45 PM

## 2018-12-27 NOTE — Assessment & Plan Note (Signed)
T2 low, likely neutrophilic asthma  Plan: Z-Pak today Short course of prednisone today Outpatient Covid testing If negative for Covid then likely patient needs close follow-up in our office for further evaluation If symptoms worsen patient needs to present to the emergency room Continue Symbicort 160

## 2018-12-27 NOTE — Assessment & Plan Note (Signed)
It is reassuring the patient is afebrile today Patient does have worsened body aches Patient does have increased cough and congestion Patient is also having bouts of dyspnea and shortness of breath Patient does not have the ability to check her oxygen levels at home  Plan: Would like for patient to obtain outpatient Covid testing  I reviewed the information below with the patient.  I have also provided the telephone number for her to call and schedule Covid testing.  I have also provided the address to [facility].  Patient reports that she will contact the below number as soon as she gets off the phone to schedule her Covid test.  To Schedule COVID19 testing:   Please have the patient text "COVID" to [phone]        OR      log onto the Internet and access [email or web address] to make an online appointment for Covid testing  If you are unable to text or unable to access the website to make an online appointment then you can contact [phone] to get assistance with scheduling a Covid test over the phone.  Please contact our office if they are unable to get you scheduled for a Covid test within a timely manner.   COVID Testing Site Locations (For sick patients only, pre-procedure is done differently)  . [place] [address] [place], [place] [address] . [place]  (on [employer])  o Nature conservation officer  . [place] [facility], [facility] and [facility] will be open from 10 a.m. - 3 p.m.        If patient is negative for Covid the patient will need to have close follow-up in our office with an in person evaluation and likely chest x-ray imaging.

## 2018-12-27 NOTE — Assessment & Plan Note (Signed)
Plan: Complete follow-up with ear nose and throat Complete scheduled thyroid ultrasound on 01/03/2019

## 2018-12-27 NOTE — Progress Notes (Signed)
Virtual Visit via Telephone Note  I connected with Sharon Spears on 12/27/18 at 11:00 AM EST by telephone and verified that I am speaking with the correct person using two identifiers.  Location: Patient: Home Provider: Office [facility] - [address], [place], [place], [place] [address]   I discussed the limitations, risks, security and privacy concerns of performing an evaluation and management service by telephone and the availability of in person appointments. I also discussed with the patient that there may be a patient responsible charge related to this service. The patient expressed understanding and agreed to proceed.  Patient consented to consult via telephone: Yes People present and their role in pt care: Pt    History of Present Illness:  73 year old female never smoker but with prolonged smoke exposure living in [place] for over 50 years referred to our office on 01/26/2018 for prolonged dyspnea.  Also managed for asthma.  PMH: Congestive heart failure Smoker/ Smoking History: Never smoker Maintenance: Symbicort 160 Pt of: Dr. Valeta Harms  Chief complaint: Cough, hard time sleeping  73 year old female never smoker followed in our office for dyspnea as well as suspected asthma.  Patient is prone to frequent exacerbations.  Patient contacted our office on 12/27/2018 reporting that she is a cough and a hard time sleeping.  Since last being seen in our office she has completed a consult with ENT on 12/22/2018 at [facility].  They ordered a ultrasound of her thyroid.  Recommended GERD diet.  Patient was also treated on 12/10/2018 for community-acquired pneumonia.  Patient was treated with Levaquin at that time.  She was requested to follow-up with our office in January with a repeat chest x-ray and a follow-up with Dr. Valeta Harms or an APP.  Patient completing follow-up with our office today over the phone.  She contact her office on 12/27/2018 reporting that she has had  increased shortness of breath, cough and congestion for 3 days.  She denies fevers.  She has not checked her temperature yet.  While on the phone I instructed the patient to check her temperature she reports that her thermometer is reading 98.4 F.  She does unfortunately continue to have body aches.  She reports these are slightly worse than usual.  She denies loss of sense of smell or taste.  She has completed recent follow-up with urology she does not remember the medical provider she saw but it was [facility].  We will request these records today.  She reports that they would like for her to complete a CT on 01/04/2019.  She reports that is scheduled.  She is also completed follow-up with ear nose and throat as outlined above.  She has a scheduled ultrasound of her thyroid on 01/03/2019.  Patient reports that she continues to have choking episodes at night as well as during the day.  Anxiety is a component of this.  She reports that her anxiety worsens this.  She denies acid reflux symptoms.  She reports adherence to her acid reflux diet as well as her medications.  Patient continues to struggle with primary care follow-up.  She reports that she will no longer be seen her primary care provider Dustin Folks, FNP as he has no longer in network at [facility].  She is waiting for follow-up from [place] [facility] for a list of her primary care providers.  Overall telephonic exam is difficult to gather as patient continues to have a scattered thought process and needs reassurance and  redirection often.   Observations/Objective:  12/27/2018 - temp - 98.4  02/08/2018-pulmonary function test - FVC 1.46 (58% predicted), postbronchodilator ratio 82, postbronchodilator FEV1 1.19 (63% predicted), no significant bronchodilator response, slight mid flow reversibility after administration of bronchodilators, DLCO is 67 which corrects to 79 based off of hemoglobin.  06/12/2018-IgE-49 06/12/2018-CBC with  differential-eosinophils relative 0.6, eosinophils absolute 0  11/23/2018-CT chest high-res-there are areas of parenchymal banding throughout the mid to lower lungs bilaterally which favored to reflect chronic postinfectious or inflammatory scarring, no imaging findings to clearly indicate interstitial lung disease, small left pleural effusion, hemorrhagic lesion in the upper pole of the left kidney, presumably a Angio myolipoma based on comparison with remote prior CT the abdomen pelvis from 07/16/2011.  Consultation with urology for further work-up and management is recommended.  Dilatation of the pulmonic trunk which may suggest pulmonary arterial hypertension, 2 mm nonobstructive calculus in the upper pole collecting system of left kidney, aortic arthrosclerosis, 3.2 x 2.9 cm intermediate attenuation lesion in the left lobe of the thyroid gland is indeterminate, further evaluation with nonemergent thyroid ultrasound is recommended in the near future to better evaluate this lesion and determine potential need for fine-needle aspiration  07/20/2018-echocardiogram-LV has mildly reduced systolic function, EF of 45 to 50%, cavity size normal, right ventricle has normal systolic function, no increase in right ventricular wall thickness, left atrial size was mildly dilated, right atrial size was mildly dilated, moderate thickening of mitral valve leaflet, moderate calcification of the mitral valve leaflet  Social History   Tobacco Use  Smoking Status Never Smoker  Smokeless Tobacco Never Used   Immunization History  Administered Date(s) Administered  . Influenza,inj,Quad PF,6+ Mos 11/21/2015      Assessment and Plan:  Asthma T2 low, likely neutrophilic asthma  Plan: Z-Pak today Short course of prednisone today Outpatient Covid testing If negative for Covid then likely patient needs close follow-up in our office for further evaluation If symptoms worsen patient needs to present to the emergency  room Continue Symbicort 160  Abnormal findings on diagnostic imaging of lung Plan: Continue follow-up with urology Continue follow-up with ear nose and throat Order placed today for new primary care provider in the [facility] health system  Advice given about COVID-19 virus infection It is reassuring the patient is afebrile today Patient does have worsened body aches Patient does have increased cough and congestion Patient is also having bouts of dyspnea and shortness of breath Patient does not have the ability to check her oxygen levels at home  Plan: Would like for patient to obtain outpatient Covid testing  I reviewed the information below with the patient.  I have also provided the telephone number for her to call and schedule Covid testing.  I have also provided the address to [facility].  Patient reports that she will contact the below number as soon as she gets off the phone to schedule her Covid test.  To Schedule COVID19 testing:   Please have the patient text "COVID" to [phone]        OR      log onto the Internet and access [email or web address] to make an online appointment for Covid testing  If you are unable to text or unable to access the website to make an online appointment then you can contact [phone] to get assistance with scheduling a Covid test over the phone.  Please contact our office if they are unable to get you scheduled for a Covid test within a timely manner.  COVID Testing Site Locations (For sick patients only, pre-procedure is done differently)  . [place] [address] [place], [place] [address] . [place]  (on [employer])  o Nature conservation officer  . [place] [facility], [facility] and [facility] will be open from 10 a.m. - 3 p.m.        If patient is negative for Covid the patient will need to have close follow-up in  our office with an in person evaluation and likely chest x-ray imaging.  Complex care coordination Patient continues to struggle with medical care follow-up Patient has complex care needs Patient has scattered thought over telephonic exam today Patient's son has been helping patient with follow-up Patient reports that she has scheduled follow-up with ear nose and throat, scheduled thyroid ultrasound on 01/03/2019 Patient reports she is scheduled follow-up with urology, will request these records today, scheduled CT on 01/04/2019 I have placed a referral for primary care so the patient could establish within the [facility] system  Cough Plan: Z-Pak today Short course of prednisone Obtain outpatient Covid testing  Renal mass Plan: Continue follow-up with urology We will request records from urology today Complete CT as ordered by urology on 01/04/2019  Thyroid mass Plan: Complete follow-up with ear nose and throat Complete scheduled thyroid ultrasound on 01/03/2019   Follow Up Instructions:  Return in about 4 weeks (around 01/24/2019), or if symptoms worsen or fail to improve, for Follow up with Wyn Quaker FNP-C, Follow up with Dr. Valeta Harms.   I discussed the assessment and treatment plan with the patient. The patient was provided an opportunity to ask questions and all were answered. The patient agreed with the plan and demonstrated an understanding of the instructions.   The patient was advised to call back or seek an in-person evaluation if the symptoms worsen or if the condition fails to improve as anticipated.  I provided 35 minutes of non-face-to-face time during this encounter.   Lauraine Rinne, NP

## 2018-12-27 NOTE — Telephone Encounter (Signed)
Called spoke with patient who had televisit with Aaron Edelman NP today 12/27/18 and pred taper was sent to the pharmacy.  Nothing further needed; will sign off.

## 2018-12-27 NOTE — Assessment & Plan Note (Signed)
Plan: Continue follow-up with urology Continue follow-up with ear nose and throat Order placed today for new primary care provider in the [facility] health system

## 2018-12-27 NOTE — Patient Instructions (Signed)
You were seen today by Sharon Rinne, NP  for:   1. Moderate persistent asthma  - Ambulatory referral to Internal Medicine  Continue Symbicort 160 >>> 2 puffs in the morning right when you wake up, rinse out your mouth after use, 12 hours later 2 puffs, rinse after use >>> Take this daily, no matter what >>> This is not a rescue inhaler   2. Cough  - Novel Coronavirus, NAA ([facility]) - azithromycin (ZITHROMAX) 250 MG tablet; 555m (two tablets) today, then 2525m(1 tablet) for the next 4 days  Dispense: 6 tablet; Refill: 0 - predniSONE (DELTASONE) 10 MG tablet; 4 tabs for 2 days, then 3 tabs for 2 days, 2 tabs for 2 days, then 1 tab for 2 days, then stop  Dispense: 20 tablet; Refill: 0  3. Advice given about COVID-19 virus infection  We have placed an order for you to receive an outpatient Covid test.  Please follow the instructions listed below.  To Schedule COVID19 testing:   Please have the patient text "COVID" to [phone]        OR      log onto the Internet and access [email or web address] make an online appointment for Covid testing  If you are unable to text or unable to access the website to make an online appointment then you can contact [phone] get assistance with scheduling a Covid test over the phone.  Please contact our office if they are unable to get you scheduled for a Covid test within a timely manner.   COVID Testing Site Locations (For sick patients only, pre-procedure is done differently)  . [place] [id number] [place] [employer] [address] [place] conservation officer. [place] from 10 a.m. - 3 p.m.     4. Complex care coordination  Continue to work with your son on follow-up regarding your medical needs We have placed a referral to a new primary care  provider Continue to follow-up with ear nose and throat as well as with urology  5. Abnormal findings on diagnostic imaging of lung  Continue follow-up with ear nose and throat and urology  6. Renal mass  Complete CT scheduled on 01/04/2019 Continue to follow-up with urology  7. Thyroid mass  Complete thyroid ultrasound on 01/03/2019 Continue to follow-up with ear nose and throat   We recommend today:  Orders Placed This Encounter  Procedures  . Novel Coronavirus, NAA ([facility])    Order Specific Question:   Is this test for diagnosis or screening    Answer:   Diagnosis of ill patient    Order Specific Question:   Symptomatic for COVID-19 as defined by CDC    Answer:   Yes    Order Specific Question:   Date of Symptom Onset    Answer:   12/24/2018    Order Specific Question:   Hospitalized for COVID-19    Answer:   No    Order Specific Question:   Admitted to ICU for COVID-19    Answer:   No    Order Specific Question:   Previously tested for COVID-19    Answer:   Yes    Order Specific Question:   Resident in a congregate (group) care setting  Answer:   No    Order Specific Question:   Is the patient student?    Answer:   No    Order Specific Question:   Employed in healthcare setting    Answer:   No    Order Specific Question:   Pregnant    Answer:   No  . Ambulatory referral to Internal Medicine    Referral Priority:   Routine    Referral Type:   Consultation    Referral Reason:   Specialty Services Required    Requested Specialty:   Internal Medicine    Number of Visits Requested:   1   Orders Placed This Encounter  Procedures  . Novel Coronavirus, NAA ([facility])  . Ambulatory referral to Internal Medicine   Meds ordered this encounter  Medications  . azithromycin (ZITHROMAX) 250 MG tablet    Sig: 580m (two tablets) today, then 2526m(1 tablet) for the next 4 days    Dispense:  6 tablet    Refill:  0  . predniSONE (DELTASONE) 10 MG tablet    Sig: 4  tabs for 2 days, then 3 tabs for 2 days, 2 tabs for 2 days, then 1 tab for 2 days, then stop    Dispense:  20 tablet    Refill:  0    Follow Up:    Return in about 4 weeks (around 01/24/2019), or if symptoms worsen or fail to improve, for Follow up with BrWyn QuakerNP-C, Follow up with Dr. IcValeta Harms If you were negative for Covid then we will likely have a earlier follow-up in person with chest x-ray imaging with BrWyn QuakerFNP   Please do your part to reduce the spread of COVID-19:      Reduce your risk of any infection  and COVID19 by using the similar precautions used for avoiding the common cold or flu:  . Marland Kitchenash your hands often with soap and warm water for at least 20 seconds.  If soap and water are not readily available, use an alcohol-based hand sanitizer with at least 60% alcohol.  . If coughing or sneezing, cover your mouth and nose by coughing or sneezing into the elbow areas of your shirt or coat, into a tissue or into your sleeve (not your hands). . Langley Gauss MASK when in public  . Avoid shaking hands with others and consider head nods or verbal greetings only. . Avoid touching your eyes, nose, or mouth with unwashed hands.  . Avoid close contact with people who are sick. . Avoid places or events with large numbers of people in one location, like concerts or sporting events. . If you have some symptoms but not all symptoms, continue to monitor at home and seek medical attention if your symptoms worsen. . If you are having a medical emergency, call 911.   AD[place]on e-Visit: [email or web address]       [facility]: [facility]: [phone]                 [facility]: [phone]   It is flu season:   >>> Best ways to protect herself from the flu: Receive the yearly flu vaccine, practice good hand hygiene washing  with soap and also using hand sanitizer when available, eat a nutritious meals, get adequate rest, hydrate appropriately   Please contact the office if your symptoms  worsen or you have concerns that you are not improving.   Thank you for choosing [facility] for your healthcare, and for allowing [place] to partner with you on your healthcare journey. I am thankful to be able to provide care to you today.   Wyn Quaker FNP-C

## 2018-12-27 NOTE — Assessment & Plan Note (Signed)
Patient continues to struggle with medical care follow-up Patient has complex care needs Patient has scattered thought over telephonic exam today Patient's son has been helping patient with follow-up Patient reports that she has scheduled follow-up with ear nose and throat, scheduled thyroid ultrasound on 01/03/2019 Patient reports she is scheduled follow-up with urology, will request these records today, scheduled CT on 01/04/2019 I have placed a referral for primary care so the patient could establish within the [facility] health system

## 2018-12-27 NOTE — Assessment & Plan Note (Signed)
Plan: Continue follow-up with urology We will request records from urology today Complete CT as ordered by urology on 01/04/2019

## 2018-12-27 NOTE — Assessment & Plan Note (Signed)
Plan: Z-Pak today Short course of prednisone Obtain outpatient Covid testing

## 2018-12-28 ENCOUNTER — Ambulatory Visit: Payer: Medicare Other | Attending: Internal Medicine

## 2018-12-28 DIAGNOSIS — Z20822 Contact with and (suspected) exposure to covid-19: Secondary | ICD-10-CM

## 2018-12-28 NOTE — Addendum Note (Signed)
Addended by: Harrell Gave on: 12/28/2018 10:13 AM   Modules accepted: Orders

## 2018-12-30 LAB — NOVEL CORONAVIRUS, NAA: SARS-CoV-2, NAA: NOT DETECTED

## 2019-01-02 ENCOUNTER — Telehealth: Payer: Self-pay | Admitting: Pulmonary Disease

## 2019-01-02 NOTE — Telephone Encounter (Signed)
01/02/2019 0802  Patient's Covid test is negative.  This is great news.  Patient has upcoming follow-up in January which she needs to keep with our office.  If patient symptoms continue to persist she needs to present to our office for a chest x-ray and evaluation.  Can be scheduled with APP or Dr. Valeta Harms.  Wyn Quaker, FNP

## 2019-01-03 ENCOUNTER — Ambulatory Visit
Admission: RE | Admit: 2019-01-03 | Discharge: 2019-01-03 | Disposition: A | Discharge status: Home / Self Care | Payer: Medicare Other | Source: Ambulatory Visit | Attending: Otolaryngology | Admitting: Otolaryngology

## 2019-01-03 DIAGNOSIS — E079 Disorder of thyroid, unspecified: Secondary | ICD-10-CM

## 2019-01-08 ENCOUNTER — Telehealth: Payer: Self-pay | Admitting: Pulmonary Disease

## 2019-01-08 DIAGNOSIS — R059 Cough, unspecified: Secondary | ICD-10-CM

## 2019-01-08 DIAGNOSIS — R05 Cough: Secondary | ICD-10-CM

## 2019-01-08 MED ORDER — PANTOPRAZOLE SODIUM 40 MG PO TBEC: DELAYED_RELEASE_TABLET | ORAL | 0 refills | Status: DC

## 2019-01-08 MED ORDER — PREDNISONE 10 MG PO TABS: ORAL_TABLET | ORAL | 0 refills | Status: DC

## 2019-01-08 NOTE — Telephone Encounter (Signed)
Spoke with patient. She is aware of recommendations. Will go ahead and call in Protonix 33m and prednisone. She verbalized understanding of instructions.   She has an appt to see BI on 1/13.   Nothing further needed at time of call.

## 2019-01-08 NOTE — Telephone Encounter (Signed)
Med list says already on zyrtec so rec  Pantoprazole (protonix) 40 mg   Take  30-60 min before first meal of the day and change Pepcid (famotidine) to   20 mg one after supper.    Prednisone 10 mg take  4 each am x 2 days,   2 each am x 2 days,  1 each am x 2 days and stop   Ov 2 weeks - call sooner if needed

## 2019-01-08 NOTE — Telephone Encounter (Signed)
Spoke with patient. She stated that she is dealing with post nasal drip. He stated that the drainage is so much and thick that she is choking from it. She denied any body aches or fevers. Also denied being around anyone else who has been sick. She is finished with all antibiotics. She wants to know if she can have something called in for her.   MW, since [place] nor Dr. Valeta Harms are in office, can you please advise? Thanks!

## 2019-01-11 NOTE — Telephone Encounter (Signed)
Patient scheduled to see Dr. Valeta Harms on 01/24/2019 at 2:15 PM.  Nothing further is needed at this time.  Wyn Quaker, FNP

## 2019-01-15 ENCOUNTER — Other Ambulatory Visit: Payer: Self-pay | Admitting: Otolaryngology

## 2019-01-15 DIAGNOSIS — E041 Nontoxic single thyroid nodule: Secondary | ICD-10-CM

## 2019-01-16 ENCOUNTER — Other Ambulatory Visit: Payer: Self-pay

## 2019-01-16 ENCOUNTER — Encounter (HOSPITAL_COMMUNITY): Payer: Self-pay | Admitting: *Deleted

## 2019-01-16 ENCOUNTER — Other Ambulatory Visit: Payer: Self-pay | Admitting: Cardiology

## 2019-01-16 DIAGNOSIS — J449 Chronic obstructive pulmonary disease, unspecified: Secondary | ICD-10-CM | POA: Diagnosis not present

## 2019-01-16 DIAGNOSIS — R1310 Dysphagia, unspecified: Secondary | ICD-10-CM | POA: Insufficient documentation

## 2019-01-16 DIAGNOSIS — Z951 Presence of aortocoronary bypass graft: Secondary | ICD-10-CM | POA: Insufficient documentation

## 2019-01-16 DIAGNOSIS — I2510 Atherosclerotic heart disease of native coronary artery without angina pectoris: Secondary | ICD-10-CM | POA: Diagnosis not present

## 2019-01-16 DIAGNOSIS — I4891 Unspecified atrial fibrillation: Secondary | ICD-10-CM | POA: Insufficient documentation

## 2019-01-16 DIAGNOSIS — Z7982 Long term (current) use of aspirin: Secondary | ICD-10-CM | POA: Diagnosis not present

## 2019-01-16 DIAGNOSIS — Y999 Unspecified external cause status: Secondary | ICD-10-CM | POA: Diagnosis not present

## 2019-01-16 DIAGNOSIS — I10 Essential (primary) hypertension: Secondary | ICD-10-CM | POA: Diagnosis not present

## 2019-01-16 DIAGNOSIS — Z7901 Long term (current) use of anticoagulants: Secondary | ICD-10-CM | POA: Insufficient documentation

## 2019-01-16 DIAGNOSIS — Z79899 Other long term (current) drug therapy: Secondary | ICD-10-CM | POA: Insufficient documentation

## 2019-01-16 DIAGNOSIS — Y939 Activity, unspecified: Secondary | ICD-10-CM | POA: Diagnosis not present

## 2019-01-16 DIAGNOSIS — Y929 Unspecified place or not applicable: Secondary | ICD-10-CM | POA: Diagnosis not present

## 2019-01-16 DIAGNOSIS — Y33XXXA Other specified events, undetermined intent, initial encounter: Secondary | ICD-10-CM | POA: Insufficient documentation

## 2019-01-16 DIAGNOSIS — E119 Type 2 diabetes mellitus without complications: Secondary | ICD-10-CM | POA: Insufficient documentation

## 2019-01-16 DIAGNOSIS — T17208A Unspecified foreign body in pharynx causing other injury, initial encounter: Secondary | ICD-10-CM | POA: Diagnosis present

## 2019-01-16 NOTE — ED Triage Notes (Addendum)
Pt leaned room, sink and door handles prior to being seen.  3 weeks of feeling like something is sticking her throat, difficult to swallow and sleep at night, sticks toothbrush down throat to try and get relief. Has recent been dx with Thyroid problems. Pt poor historian, wants staff to know she took a shower prior to coming in, she is not too old to be clean. She rambles in conversation

## 2019-01-17 ENCOUNTER — Emergency Department (HOSPITAL_COMMUNITY)
Admission: EM | Admit: 2019-01-17 | Discharge: 2019-01-17 | Disposition: A | Discharge status: Home / Self Care | Payer: Medicare Other | Attending: Emergency Medicine | Admitting: Emergency Medicine

## 2019-01-17 DIAGNOSIS — R1310 Dysphagia, unspecified: Secondary | ICD-10-CM

## 2019-01-17 HISTORY — DX: Chronic obstructive pulmonary disease, unspecified: J44.9

## 2019-01-17 MED ORDER — LIDOCAINE VISCOUS HCL 2 % MT SOLN
15.0000 mL | Freq: Once | OROMUCOSAL | Status: AC
  Administered 2019-01-17: 15 mL via ORAL
  Filled 2019-01-17: qty 15

## 2019-01-17 MED ORDER — SUCRALFATE 1 GM/10ML PO SUSP: 1.0000 g | Freq: Three times a day (TID) | ORAL | 0 refills | Status: DC

## 2019-01-17 MED ORDER — ALUM & MAG HYDROXIDE-SIMETH 200-200-20 MG/5ML PO SUSP
30.0000 mL | Freq: Once | ORAL | Status: AC
  Administered 2019-01-17: 30 mL via ORAL
  Filled 2019-01-17: qty 30

## 2019-01-17 MED ORDER — BENZOCAINE 20 % MT AERO: INHALATION_SPRAY | Freq: Once | OROMUCOSAL | Status: AC

## 2019-01-17 NOTE — ED Notes (Signed)
Pt said the medicine made her vomit and she would like something minty

## 2019-01-17 NOTE — Discharge Instructions (Signed)
Continue your daily medications and take Carafate as prescribed. Follow up with ENT for evaluation of ongoing pain when swallowing. You may return for any other new or concerning symptoms.

## 2019-01-17 NOTE — ED Provider Notes (Signed)
[facility] DEPT Provider Note   CSN: [id number] Arrival date & time: 01/16/19  1407     History Chief Complaint  Patient presents with  . Foreign Body Throat    Sharon Spears is a 74 y.o. female.   74 y/o female with hx of COPD, Afib (on Eliquis), CAD, and adjustment d/o with anxiety presents to the ED for evaluation of odynophagia.  She has been experiencing odynophagia x2 months.  This was initially evaluated by her primary care doctor who ordered a chest CT.  She has since undergone chest x-ray as well as ultrasound of her thyroid for evaluation of a thyroid nodule.  Patient states that she continues to experience a sensation of foreign body in her throat.  When the sensation is present, she feels as though it is difficult for her to swallow and difficult for her to breathe.  She tries to alleviate her symptoms by taking a toothbrush and sticking it down her throat with warm water.  Her symptoms tend to be worse at nighttime making it difficult for her to sleep.  Symptoms have been evaluated outpatient by Dr. Blenda Nicely of ENT.  She has not yet been able to follow-up in her office for additional recommendations.  The patient has been compliant with her daily medications including Protonix.  No fevers, drooling, voice changes.  No specific worsening prompting ED visit tonight.  The history is provided by the patient. No language interpreter was used.       Past Medical History:  Diagnosis Date  . Adjustment disorder with anxiety   . Asthma   . Atrial fibrillation ([place])   . Benign essential hypertension   . COPD (chronic obstructive pulmonary disease) ([place])   . Coronary artery disease   . Diabetic neuropathic arthritis ([place])   . Heart palpitations   . Hypoxia 07/19/2018  . Other cirrhosis of liver ([place]) 07/20/2018  . Uncontrolled diabetes mellitus type 2 without complications     Patient Active Problem List   Diagnosis Date Noted  . Advice given  about COVID-19 virus infection 12/27/2018  . Thyroid mass 12/01/2018  . Renal mass 12/01/2018  . Abnormal findings on diagnostic imaging of lung 12/01/2018  . Complex care coordination 12/01/2018  . Other cirrhosis of liver ([place]) 07/20/2018  . Unspecified atrial fibrillation ([place]) 07/20/2018  . Acute respiratory failure with hypoxemia ([place]) 07/19/2018  . Asthma 06/12/2018  . Cough 02/08/2018  . Shortness of breath 02/08/2018  . Acute on chronic left systolic heart failure ([place]) 09/25/2017  . Thrombocytopenia ([place]) 03/20/2015  . Diabetes mellitus type 2, controlled ([place]) 03/20/2015  . HTN (hypertension) 03/20/2015  . S/P CABG (coronary artery bypass graft) 03/20/2015    Past Surgical History:  Procedure Laterality Date  . CARDIAC SURGERY    . CESAREAN SECTION    . CORONARY ARTERY BYPASS GRAFT  07/06/2017   Redo-sternotomy and CABG 2 with LIMA to LAD, SVG to OM, and a a closure, mitral valve repair with #30 mm [place] ring on 07/06/2017  . ESOPHAGEAL MANOMETRY N/A 03/06/2012   Procedure: ESOPHAGEAL MANOMETRY (EM);  Surgeon: Beryle Beams, MD;  Location: [facility];  Service: Endoscopy;  Laterality: N/A;  . TONSILLECTOMY       OB History   No obstetric history on file.     Family History  Problem Relation Age of Onset  . Asthma Mother   . Alcohol abuse Father     Social History   Tobacco Use  . Smoking  status: Never Smoker  . Smokeless tobacco: Never Used  Substance Use Topics  . Alcohol use: No  . Drug use: Never    Home Medications Prior to Admission medications   Medication Sig Start Date End Date Taking? Authorizing Provider  acetaminophen (TYLENOL) 325 MG tablet Take 2 tablets (650 mg total) by mouth every 6 (six) hours as needed for fever or headache. 10/31/18   Horton, Barbette Hair, MD  albuterol (PROVENTIL) (2.5 MG/3ML) 0.083% nebulizer solution Take 3 mLs (2.5 mg total) by nebulization every 6 (six) hours as needed for wheezing or shortness of breath.  06/12/18   Fenton Foy, NP  albuterol (VENTOLIN HFA) 108 (90 Base) MCG/ACT inhaler Inhale 2 puffs into the lungs every 6 (six) hours as needed for wheezing. 06/12/18   Fenton Foy, NP  apixaban (ELIQUIS) 5 MG TABS tablet Take 1 tablet (5 mg total) by mouth 2 (two) times daily. Patient taking differently: Take 5 mg by mouth 2 (two) times daily. STOPPED TEMPORARILY, INTERACTION WITH ANTIBIOTIC 09/28/17   Charolette Forward, MD  ASPIRIN 81 81 MG EC tablet Take 81 mg by mouth. occas 07/24/18   [provider]  cetirizine (ZYRTEC) 10 MG tablet Take 1 tablet (10 mg total) by mouth daily. Patient taking differently: Take 10 mg by mouth as needed.  10/26/18   Jaynee Eagles, PA-C  famotidine (PEPCID) 20 MG tablet Take 20 mg by mouth daily before breakfast.  02/04/15   [provider]  ferrous sulfate 325 (65 FE) MG tablet Take 1 tablet (325 mg total) by mouth 3 (three) times daily with meals. Patient taking differently: Take 325 mg by mouth daily.  09/28/17   Charolette Forward, MD  fluticasone (FLONASE) 50 MCG/ACT nasal spray Place 2 sprays into both nostrils 2 (two) times daily. Patient taking differently: Place 2 sprays into both nostrils as needed.  05/04/18   Lamptey, Myrene Galas, MD  furosemide (LASIX) 20 MG tablet Take 1 tablet (20 mg total) by mouth 2 (two) times daily. 07/21/18   Samuella Cota, MD  isosorbide mononitrate (IMDUR) 30 MG 24 hr tablet Take 1 tablet by mouth as needed.  07/12/18   [provider]  levofloxacin (LEVAQUIN) 500 MG tablet Take 1 tablet (500 mg total) by mouth daily. 12/10/18   Carmin Muskrat, MD  losartan (COZAAR) 100 MG tablet Take 1 tablet by mouth daily. 08/16/18   [provider]  magic mouthwash w/lidocaine SOLN Take 5 mLs by mouth 3 (three) times daily as needed for mouth pain. 12/05/18   Wieters, Hallie C, PA-C  metFORMIN (GLUCOPHAGE XR) 500 MG 24 hr tablet Take 1 tablet (500 mg total) by mouth daily with breakfast. Patient taking  differently: Take 500 mg by mouth 2 (two) times daily.  06/02/16   Golden Circle, FNP  montelukast (SINGULAIR) 10 MG tablet Take 1 tablet (10 mg total) by mouth at bedtime. Patient taking differently: Take 10 mg by mouth as needed.  06/12/18   Fenton Foy, NP  Multiple Vitamin (MULTIVITAMIN WITH MINERALS) TABS tablet Take 1 tablet by mouth daily. Patient taking differently: Take 1 tablet by mouth. occas 09/28/17   Charolette Forward, MD  nitroGLYCERIN (NITROSTAT) 0.4 MG SL tablet Place 0.4 mg under the tongue every 5 (five) minutes as needed for chest pain.     [provider]  Olopatadine HCl 0.2 % SOLN Apply   into affected eye  as needed 07/24/18   [provider]  ondansetron (ZOFRAN ODT) 4  MG disintegrating tablet Take 1 tablet (4 mg total) by mouth every 8 (eight) hours as needed for nausea or vomiting. 12/05/18   Wieters, Hallie C, PA-C  pantoprazole (PROTONIX) 40 MG tablet Take 1 tablet daily before first meal 01/08/19   Tanda Rockers, MD  predniSONE (DELTASONE) 10 MG tablet 4 tabs for 2 days, then 3 tabs for 2 days, 2 tabs for 2 days, then 1 tab for 2 days, then stop 12/27/18   Lauraine Rinne, NP  predniSONE (DELTASONE) 10 MG tablet Take 4 tabs x 2 days, 2 tabs x 2 days, 1 tab x 2 days and then stop 01/08/19   Tanda Rockers, MD  sucralfate (CARAFATE) 1 GM/10ML suspension Take 10 mLs (1 g total) by mouth 4 (four) times daily -  with meals and at bedtime. 01/17/19   Antonietta Breach, PA-C  traMADol (ULTRAM) 50 MG tablet as needed. 10/23/18   [provider]    Allergies    Contrast media [iodinated diagnostic agents], Adhesive [tape], and Penicillins  Review of Systems   Review of Systems  Ten systems reviewed and are negative for acute change, except as noted in the HPI.    Physical Exam Updated Vital Signs BP (!) 188/74 (BP Location: Left Arm)   Pulse 61   Temp 98.2 F (36.8 C) (Oral)   Resp 16   Ht _0  (1.575 m)   Wt 64.4 kg   SpO2 99%   BMI 25.97  kg/m   Physical Exam Vitals and nursing note reviewed.  Constitutional:      General: She is not in acute distress.    Appearance: She is well-developed. She is not diaphoretic.     Comments: Nontoxic appearing  HENT:     Head: Normocephalic and atraumatic.     Mouth/Throat:     Mouth: Mucous membranes are moist.     Comments: Normal phonation. Oropharynx clear without edema or visible FB. Tongue protrusion is normal. No evidence of trauma to the posterior oropharynx. Tolerating secretions with discomfort. Eyes:     General: No scleral icterus.    Conjunctiva/sclera: Conjunctivae normal.  Pulmonary:     Effort: Pulmonary effort is normal. No respiratory distress.     Breath sounds: No stridor.     Comments: Respirations even and unlabored. No stridor. Musculoskeletal:        General: Normal range of motion.     Cervical back: Normal range of motion.  Skin:    General: Skin is warm and dry.     Coloration: Skin is not pale.     Findings: No erythema or rash.  Neurological:     General: No focal deficit present.     Mental Status: She is alert and oriented to person, place, and time.     Coordination: Coordination normal.     Comments: Ambulatory with steady gait.  Psychiatric:        Behavior: Behavior normal.     ED Results / Procedures / Treatments   Labs (all labs ordered are listed, but only abnormal results are displayed) Labs Reviewed - No data to display  EKG None  Radiology No results found.  Procedures Procedures (including critical care time)  Medications Ordered in ED Medications  Benzocaine (HURRCAINE) 20 % mouth spray (has no administration in time range)  alum & mag hydroxide-simeth (MAALOX/MYLANTA) 200-200-20 MG/5ML suspension 30 mL (30 mLs Oral Given 01/17/19 0319)    And  lidocaine (XYLOCAINE) 2 % viscous mouth solution  15 mL (15 mLs Oral Given 01/17/19 0319)    ED Course  I have reviewed the triage vital signs and the nursing  notes.  Pertinent labs & imaging results that were available during my care of the patient were reviewed by me and considered in my medical decision making (see chart for details).    MDM Rules/Calculators/A&P                       74 year old female presenting for evaluation of chronic odynophagia.  She has been evaluated for this in the emergency department as well as by her primary care doctor and Dr. Blenda Nicely of ENT.  Has not yet been able to follow-up with Dr. Blenda Nicely in the office.  No specific worsening prompting ED visit tonight.  It seems, based on encounter, that patient reported tonight out of desperation for management of ongoing symptoms.  She is not experiencing any shortness of breath or difficulty tolerating her secretions.  Her phonation is normal.  There is no edema, trauma, foreign body noted in the posterior oropharynx.  Reassuring oxygen saturations over prolonged ED wait.    Do not feel further emergent work-up is presently indicated.  She has been ordered to receive a GI cocktail for symptom control.  Hurricaine spray also used for pain.  Given reflux history, will add Carafate to existing regimen.  She has been encouraged to follow-up with ENT for ongoing assessment.  Return precautions discussed and provided. Patient discharged in stable condition with no unaddressed concerns.   Final Clinical Impression(s) / ED Diagnoses Final diagnoses:  Odynophagia    Rx / DC Orders ED Discharge Orders         Ordered    sucralfate (CARAFATE) 1 GM/10ML suspension  3 times daily with meals & bedtime     01/17/19 0349           Antonietta Breach, PA-C 01/17/19 0411    Ripley Fraise, MD 01/17/19 [phone]

## 2019-01-18 ENCOUNTER — Encounter: Payer: Self-pay | Admitting: Cardiology

## 2019-01-21 ENCOUNTER — Other Ambulatory Visit: Payer: Self-pay | Admitting: Internal Medicine

## 2019-01-22 ENCOUNTER — Other Ambulatory Visit: Payer: Self-pay | Admitting: Urology

## 2019-01-22 DIAGNOSIS — D1771 Benign lipomatous neoplasm of kidney: Secondary | ICD-10-CM

## 2019-01-24 ENCOUNTER — Ambulatory Visit
Admission: RE | Admit: 2019-01-24 | Discharge: 2019-01-24 | Disposition: A | Discharge status: Home / Self Care | Payer: Medicare Other | Source: Ambulatory Visit | Attending: Urology | Admitting: Urology

## 2019-01-24 ENCOUNTER — Ambulatory Visit (INDEPENDENT_AMBULATORY_CARE_PROVIDER_SITE_OTHER): Payer: Medicare Other | Admitting: Pulmonary Disease

## 2019-01-24 ENCOUNTER — Encounter: Payer: Self-pay | Admitting: Pulmonary Disease

## 2019-01-24 ENCOUNTER — Other Ambulatory Visit: Payer: Self-pay

## 2019-01-24 VITALS — BP 118/72 | HR 74 | Temp 97.2°F | Ht 62.0 in | Wt 147.6 lb

## 2019-01-24 DIAGNOSIS — N2889 Other specified disorders of kidney and ureter: Secondary | ICD-10-CM | POA: Diagnosis not present

## 2019-01-24 DIAGNOSIS — I4821 Permanent atrial fibrillation: Secondary | ICD-10-CM

## 2019-01-24 DIAGNOSIS — D1771 Benign lipomatous neoplasm of kidney: Secondary | ICD-10-CM

## 2019-01-24 DIAGNOSIS — E079 Disorder of thyroid, unspecified: Secondary | ICD-10-CM

## 2019-01-24 DIAGNOSIS — F339 Major depressive disorder, recurrent, unspecified: Secondary | ICD-10-CM

## 2019-01-24 DIAGNOSIS — J441 Chronic obstructive pulmonary disease with (acute) exacerbation: Secondary | ICD-10-CM

## 2019-01-24 DIAGNOSIS — I5023 Acute on chronic systolic (congestive) heart failure: Secondary | ICD-10-CM

## 2019-01-24 DIAGNOSIS — J4540 Moderate persistent asthma, uncomplicated: Secondary | ICD-10-CM

## 2019-01-24 DIAGNOSIS — J9601 Acute respiratory failure with hypoxia: Secondary | ICD-10-CM

## 2019-01-24 DIAGNOSIS — D696 Thrombocytopenia, unspecified: Secondary | ICD-10-CM

## 2019-01-24 DIAGNOSIS — I25118 Atherosclerotic heart disease of native coronary artery with other forms of angina pectoris: Secondary | ICD-10-CM

## 2019-01-24 DIAGNOSIS — E1169 Type 2 diabetes mellitus with other specified complication: Secondary | ICD-10-CM

## 2019-01-24 DIAGNOSIS — K7469 Other cirrhosis of liver: Secondary | ICD-10-CM

## 2019-01-24 MED ORDER — IPRATROPIUM-ALBUTEROL 0.5-2.5 (3) MG/3ML IN SOLN: 3.0000 mL | RESPIRATORY_TRACT | 12 refills | Status: DC | PRN

## 2019-01-24 MED ORDER — BUDESONIDE 0.25 MG/2ML IN SUSP: 0.2500 mg | Freq: Two times a day (BID) | RESPIRATORY_TRACT | 12 refills | Status: DC

## 2019-01-24 MED ORDER — ARFORMOTEROL TARTRATE 15 MCG/2ML IN NEBU: 15.0000 ug | INHALATION_SOLUTION | Freq: Two times a day (BID) | RESPIRATORY_TRACT | 6 refills | Status: DC

## 2019-01-24 NOTE — Consult Note (Signed)
Chief Complaint:  5.6 cm left renal angiomyolipoma  Referring Physician(s): Winter,Christopher Marjory Lies  History of Present Illness: Sharon Spears is a 74 y.o. female who was found to have a hemorrhagic lesion by chest CT 11/23/2018 during her work-up for shortness of breath.  The left renal lesion was incompletely imaged.  Therefore a complete CT was performed of the abdomen and pelvis with contrast.  This confirms a 5.6 cm left kidney lesion with fat and soft tissue attenuation compatible with an AML.  There is hyperdense attenuation within the lesion suggesting internal hemorrhage.  No retroperitoneal or perinephric hemorrhage appreciated.  No other acute renal abnormality.  Telephone health visit today to review angioembolization electively of the angiomyolipoma. Past Medical History:  Diagnosis Date  . Adjustment disorder with anxiety   . Asthma   . Atrial fibrillation ([place])   . Benign essential hypertension   . COPD (chronic obstructive pulmonary disease) ([place])   . Coronary artery disease   . Diabetic neuropathic arthritis ([place])   . Heart palpitations   . Hypoxia 07/19/2018  . Other cirrhosis of liver ([place]) 07/20/2018  . Uncontrolled diabetes mellitus type 2 without complications     Past Surgical History:  Procedure Laterality Date  . CARDIAC SURGERY    . CESAREAN SECTION    . CORONARY ARTERY BYPASS GRAFT  07/06/2017   Redo-sternotomy and CABG 2 with LIMA to LAD, SVG to OM, and a a closure, mitral valve repair with #30 mm [place] ring on 07/06/2017  . ESOPHAGEAL MANOMETRY N/A 03/06/2012   Procedure: ESOPHAGEAL MANOMETRY (EM);  Surgeon: Beryle Beams, MD;  Location: [facility];  Service: Endoscopy;  Laterality: N/A;  . TONSILLECTOMY      Allergies: Contrast media [iodinated diagnostic agents], Adhesive [tape], and Penicillins  Medications: Prior to Admission medications   Medication Sig Start Date End Date Taking? Authorizing Provider  acetaminophen  (TYLENOL) 325 MG tablet Take 2 tablets (650 mg total) by mouth every 6 (six) hours as needed for fever or headache. 10/31/18   Horton, Barbette Hair, MD  albuterol (PROVENTIL) (2.5 MG/3ML) 0.083% nebulizer solution Take 3 mLs (2.5 mg total) by nebulization every 6 (six) hours as needed for wheezing or shortness of breath. 06/12/18   Fenton Foy, NP  albuterol (VENTOLIN HFA) 108 (90 Base) MCG/ACT inhaler Inhale 2 puffs into the lungs every 6 (six) hours as needed for wheezing. 06/12/18   Fenton Foy, NP  apixaban (ELIQUIS) 5 MG TABS tablet Take 1 tablet (5 mg total) by mouth 2 (two) times daily. 09/28/17   Charolette Forward, MD  arformoterol (BROVANA) 15 MCG/2ML NEBU Take 2 mLs (15 mcg total) by nebulization 2 (two) times daily. 01/24/19   Icard, Octavio Graves, DO  ASPIRIN 81 81 MG EC tablet Take 81 mg by mouth. occas 07/24/18   [provider]  budesonide (PULMICORT) 0.25 MG/2ML nebulizer solution Take 2 mLs (0.25 mg total) by nebulization 2 (two) times daily. 01/24/19   Icard, Octavio Graves, DO  cetirizine (ZYRTEC) 10 MG tablet Take 1 tablet (10 mg total) by mouth daily. Patient taking differently: Take 10 mg by mouth as needed.  10/26/18   Jaynee Eagles, PA-C  diphenhydrAMINE (BENADRYL) 12.5 MG/5ML elixir Take 12.5 mg by mouth daily as needed.  12/05/18   [provider]  famotidine (PEPCID) 20 MG tablet Take 20 mg by mouth daily before breakfast.  02/04/15   [provider]  ferrous sulfate 325 (65 FE) MG tablet Take  1 tablet (325 mg total) by mouth 3 (three) times daily with meals. Patient taking differently: Take 325 mg by mouth daily.  09/28/17   Charolette Forward, MD  fluticasone (FLONASE) 50 MCG/ACT nasal spray Place 2 sprays into both nostrils 2 (two) times daily. Patient taking differently: Place 2 sprays into both nostrils as needed.  05/04/18   Lamptey, Myrene Galas, MD  furosemide (LASIX) 20 MG tablet Take 1 tablet (20 mg total) by mouth 2 (two) times daily. 07/21/18   Samuella Cota, MD  ipratropium-albuterol (DUONEB) 0.5-2.5 (3) MG/3ML SOLN Take 3 mLs by nebulization every 4 (four) hours as needed. 01/24/19   Icard, Octavio Graves, DO  isosorbide mononitrate (IMDUR) 30 MG 24 hr tablet Take 1 tablet by mouth as needed.  07/12/18   [provider]  losartan (COZAAR) 100 MG tablet Take 1 tablet by mouth daily. 08/16/18   [provider]  magic mouthwash w/lidocaine SOLN Take 5 mLs by mouth 3 (three) times daily as needed for mouth pain. 12/05/18   Wieters, Hallie C, PA-C  metFORMIN (GLUCOPHAGE XR) 500 MG 24 hr tablet Take 1 tablet (500 mg total) by mouth daily with breakfast. Patient taking differently: Take 500 mg by mouth 2 (two) times daily.  06/02/16   Golden Circle, FNP  montelukast (SINGULAIR) 10 MG tablet Take 1 tablet (10 mg total) by mouth at bedtime. Patient taking differently: Take 10 mg by mouth as needed.  06/12/18   Fenton Foy, NP  Multiple Vitamin (MULTIVITAMIN WITH MINERALS) TABS tablet Take 1 tablet by mouth daily. Patient taking differently: Take 1 tablet by mouth. occas 09/28/17   Charolette Forward, MD  nitroGLYCERIN (NITROSTAT) 0.4 MG SL tablet Place 0.4 mg under the tongue every 5 (five) minutes as needed for chest pain.     [provider]  Olopatadine HCl 0.2 % SOLN Apply   into affected eye  as needed 07/24/18   [provider]  ondansetron (ZOFRAN ODT) 4 MG disintegrating tablet Take 1 tablet (4 mg total) by mouth every 8 (eight) hours as needed for nausea or vomiting. 12/05/18   Wieters, Hallie C, PA-C  pantoprazole (PROTONIX) 40 MG tablet Take 1 tablet daily before first meal 01/08/19   Tanda Rockers, MD  sucralfate (CARAFATE) 1 GM/10ML suspension Take 10 mLs (1 g total) by mouth 4 (four) times daily -  with meals and at bedtime. 01/17/19   Antonietta Breach, PA-C  traMADol (ULTRAM) 50 MG tablet as needed. 10/23/18   [provider]     Family History  Problem Relation Age of Onset  . Asthma Mother   . Alcohol  abuse Father     Social History   Socioeconomic History  . Marital status: Legally Separated    Spouse name: Not on file  . Number of children: 4  . Years of education: 59  . Highest education level: Not on file  Occupational History  . Occupation: Retired  Tobacco Use  . Smoking status: Never Smoker  . Smokeless tobacco: Never Used  Substance and Sexual Activity  . Alcohol use: No  . Drug use: Never  . Sexual activity: Yes  Other Topics Concern  . Not on file  Social History Narrative   Fun/Hobby: Gardening    Denies abuse and feels safe at home.    1 son deceased   Social Determinants of Health   Financial Resource Strain:   . Difficulty of Paying Living Expenses: Not on file  Food  Insecurity:   . Worried About Charity fundraiser in the Last Year: Not on file  . Ran Out of Food in the Last Year: Not on file  Transportation Needs:   . Lack of Transportation (Medical): Not on file  . Lack of Transportation (Non-Medical): Not on file  Physical Activity:   . Days of Exercise per Week: Not on file  . Minutes of Exercise per Session: Not on file  Stress:   . Feeling of Stress : Not on file  Social Connections:   . Frequency of Communication with Friends and Family: Not on file  . Frequency of Social Gatherings with Friends and Family: Not on file  . Attends Religious Services: Not on file  . Active Member of Clubs or Organizations: Not on file  . Attends Archivist Meetings: Not on file  . Marital Status: Not on file     Review of Systems  Review of Systems: A 12 point ROS discussed and pertinent positives are indicated in the HPI above.  All other systems are negative.  Physical Exam No direct physical exam was performed, telephone visit only today with the patient and her daughter Vital Signs: There were no vitals taken for this visit.  Imaging: US THYROID  Result Date: 01/03/2019 CLINICAL DATA:  Thyroid mass seen on chest CT. EXAM: THYROID  ULTRASOUND TECHNIQUE: Ultrasound examination of the thyroid gland and adjacent soft tissues was performed. COMPARISON:  Chest CT 11/23/2018 FINDINGS: Parenchymal Echotexture: Mildly heterogenous Isthmus: 0.4 cm Right lobe: 4.0 x 1.4 x 1.2 cm Left lobe: 5.0 x 2.5 x 2.6 cm _________________________________________________________ Estimated total number of nodules >/= 1 cm: 1 Number of spongiform nodules >/=  2 cm not described below (TR1): 0 Number of mixed cystic and solid nodules >/= 1.5 cm not described below (TR2): 0 _________________________________________________________ Nodule # 1: Location: Right; Superior Maximum size: 0.9 cm; Other 2 dimensions: 0.8 x 0.5 cm Composition: solid/almost completely solid (2) Echogenicity: hypoechoic (2) Shape: not taller-than-wide (0) Margins: smooth (0) Echogenic foci: none (0) ACR TI-RADS total points: 4. ACR TI-RADS risk category: TR4 (4-6 points). ACR TI-RADS recommendations: Given size (<0.9 cm) and appearance, this nodule does NOT meet TI-RADS criteria for biopsy or dedicated follow-up. _________________________________________________________ Nodule # 1: Location: Left; Mid Maximum size: 4.2 cm; Other 2 dimensions: 4.2 x 2.5 cm Composition: solid/almost completely solid (2) Echogenicity: hypoechoic (2) Shape: not taller-than-wide (0) Margins: ill-defined (0) Echogenic foci: none (0) ACR TI-RADS total points: 4. ACR TI-RADS risk category: TR4 (4-6 points). ACR TI-RADS recommendations: **Given size (>/= 1.5 cm) and appearance, fine needle aspiration of this moderately suspicious nodule should be considered based on TI-RADS criteria. _________________________________________________________ No discrete right thyroid nodule. IMPRESSION: Dominant left thyroid nodule measures 4.2 cm. This nodule meets criteria for ultrasound-guided biopsy. The above is in keeping with the ACR TI-RADS recommendations - J Am Coll Radiol 2017;14:587-595. Electronically Signed   By: Markus Daft  M.D.   On: 01/03/2019 16:44    Labs:  CBC: Recent Labs    07/20/18 0524 08/12/18 0526 10/30/18 1911 12/10/18 0829  WBC 6.2 6.1 8.2 6.6  HGB 10.5* 11.3* 10.4* 11.4*  HCT 33.4* 35.7* 33.5* 36.0  PLT 182 112* 230 167    COAGS: No results for input(s): INR, APTT in the last 8760 hours.  BMP: Recent Labs    07/20/18 0524 08/12/18 0526 10/30/18 1911 12/10/18 0829  NA 137 138 132* 135  K 3.8 4.9 5.3* 4.2  CL 102 107 101  102  CO2 24 24 20* 23  GLUCOSE 101* 207* 132* 136*  BUN 25* _0 CALCIUM 8.6* 9.0 8.6* 8.6*  CREATININE 0.98 0.86 0.79 0.81  GFRNONAA 58* >60 >60 >60  GFRAA >60 >60 >60 >60    LIVER FUNCTION TESTS: Recent Labs    07/19/18 1256 12/10/18 0829  BILITOT 1.0 1.1  AST 30 38  ALT 26 41  ALKPHOS 62 60  PROT 6.8 7.5  ALBUMIN 3.4* 3.2*     Assessment and Plan:  5.6 cm left kidney angiomyolipoma with evidence of internal hemorrhage and significant increase in size when compared to 2013.  At this size, there is increased risk of spontaneous hemorrhage.  Recommendations are to embolize angiomyolipomas greater than 4 cm to decrease the risk of spontaneous hemorrhage.  The angioembolization procedure was reviewed in detail.  The procedure, risk, benefits and alternatives were reviewed.  The expected goals, recovery, outcome, and continued surveillance were reviewed.  All questions were addressed.  She understands this is an elective procedure that can be performed as an outpatient.  Plan: Scheduled for elective left renal angioembolization of the AML at [facility] in the next few weeks.  Thank you for this interesting consult.  I greatly enjoyed meeting Sharon Spears and look forward to participating in their care.  A copy of this report was sent to the requesting provider on this date.  Electronically Signed: Greggory Keen 01/24/2019, 4:11 PM   I spent a total of  40 Minutes   in remote  clinical consultation, greater than 50% of which was  counseling/coordinating care for this patient with a 5.3 cm left AML.    Visit type: Audio only (telephone). Audio (no video) only due to patient's lack of internet/smartphone capability. Alternative for in-person consultation at [facility], [address], [place], [place]. This visit type was conducted due to national recommendations for restrictions regarding the COVID-19 Pandemic (e.g. social distancing).  This format is felt to be most appropriate for this patient at this time.  All issues noted in this document were discussed and addressed.

## 2019-01-24 NOTE — Patient Instructions (Signed)
Thank you for visiting Dr. Valeta Harms at [facility]. Today we recommend the following:  Start pulmicort 231mg twice daily nebulized  Start brovana twice daily nebulized  Continue duonebs every 6 hours as needed   Return in about 3 weeks (around 02/14/2019) for with APP, BWyn Quaker NP.    Please do your part to reduce the spread of COVID-19.

## 2019-01-24 NOTE — Progress Notes (Signed)
Synopsis: Referred in Jan. 2020 for coughing, pain, headache by Sonia Side., FNP  Subjective:   PATIENT ID: Sharon Spears GENDER: female DOB: 1945/01/31, MRN: [id number]  Chief Complaint  Patient presents with  . Follow-up    Patient reports still having sob with exertion and alot of sputum that chokes her.     This is a 74 year old with complaints of fever and body aches.  She was recently treated with an upper respiratory tract infection approximately month ago.  She was seen in the emergency room.  She was referred to the pulmonologist office for respiratory complaints following this.  She does have known congestive heart failure.  She is a lifelong non-smoker.  But she did spend nearly 50 years in [place].  She is concerned for air pollution and smog affect to her pulmonary function.  Over the past several weeks she has had progressive dyspnea.  She states that she has been compliant with her congestive heart failure regimen.  She has noticed some mild increase in the swelling of her legs.  She recently followed up with her cardiologist last week.  She does have cough that is productive at times, chest tightness, denies hemoptysis.  OV 09/05/2018: Since last office visit patient has had multiple episodes of recurrent cough and congestion.  She has been seen by 1 of our nurse practitioners in March, ER April x2, office June, ER July for all similar symptoms.  Called into the office again on August 21 with complaints of congestion.  Chest imaging in June stable consistent with prior CABG and sternotomy. Chest x-ray in July with basilar atelectasis in the left pleural effusion.  Echocardiogram in July 2020 with a depressed ejection fraction 45 to 50%.  Left ventricular hypokinesis. She wakes up every night with coughing symptoms. At night time she had worse symptoms.  He has ongoing nocturnal wheezing.  She always feels like she has a tickle in her throat.  During the daytime is better.   Nighttime is much worse.  She will occasionally steam a pot of hot water and breathe in steam in the middle of the night which helps soothe her coughing spells.  OV 11/01/2018: Patient last seen in the office in August.  Unfortunately recently had an exacerbation requiring antibiotics.  Possible viral URI symptoms.  She was treated with antibiotics.  She also had a migraine was seen in the emergency room this was also treated.  She was Covid tested which was negative.  She is followed by cardiology for chronic systolic heart failure.  She takes her medications regularly.  Has a history of asthma.  Uses her Symbicort daily.  Her symptoms at this point are better.  We reviewed her pulmonary function test that were completed back in January.  We looked at her chest x-ray that was recently completed in the ER.  She still has some left-sided elevated hemidiaphragm plus scarring in the left base.  History of CABG.  OV 12/13/2018: This is Ms. Hoefer here today for follow-up after recent ER visit.  Unfortunately found to have left upper lobe infiltrate.  Was treated for community-acquired pneumonia and given Levaquin.  Overall at this time she is breathing much better.  She denies any fevers chills night sweats weight loss.  No cough no sputum production.  She is sleeping better.  She was given doxycycline and prednisone via telemetry visit with Wyn Quaker, NP.  Patient unfortunately had insomnia.  She thinks this was related to  the doxycycline however more likely related to the prednisone dosing.  However she is breathing better.  She does present very tearful today and very sad that she is unable to visit with family.  She has a daughter in [place] who is a Marine scientist and a son who is a [place] [employer] in [place].  She spends most of the time alone and she is very sad about not being able to be with people.  But she understands the importance of this right now.    OV 01/24/2019: Patient doing better today.  Recently  treated for exacerbation with steroids again.  She routinely feels better after treatment with steroids.  Unfortunately she does not use a regular maintenance inhaler.  She finds several excuses not to use her Symbicort inhaler.  When she does use it she does feel better but routinely forgets to use it then will have exacerbations requiring shortness of breath and steroids again.  She is very anxious at baseline and feels anxious when she is home alone.  I believe this plays some into her symptoms.  Today overall feeling better than she was before.  Did have follow-up with ENT with planned biopsy of the thyroid nodule.  Additionally has a renal mass that has seen urology with plans for routine image follow-up.  At least this is what she told me.  She states to me that she prefers using her nebulizer than using her inhaler she does not feel like she is able to operate the inhalers appropriately.    Past Medical History:  Diagnosis Date  . Adjustment disorder with anxiety   . Asthma   . Atrial fibrillation ([place])   . Benign essential hypertension   . COPD (chronic obstructive pulmonary disease) ([place])   . Coronary artery disease   . Diabetic neuropathic arthritis ([place])   . Heart palpitations   . Hypoxia 07/19/2018  . Other cirrhosis of liver ([place]) 07/20/2018  . Uncontrolled diabetes mellitus type 2 without complications      Family History  Problem Relation Age of Onset  . Asthma Mother   . Alcohol abuse Father      Past Surgical History:  Procedure Laterality Date  . CARDIAC SURGERY    . CESAREAN SECTION    . CORONARY ARTERY BYPASS GRAFT  07/06/2017   Redo-sternotomy and CABG 2 with LIMA to LAD, SVG to OM, and a a closure, mitral valve repair with #30 mm [place] ring on 07/06/2017  . ESOPHAGEAL MANOMETRY N/A 03/06/2012   Procedure: ESOPHAGEAL MANOMETRY (EM);  Surgeon: Beryle Beams, MD;  Location: [facility];  Service: Endoscopy;  Laterality: N/A;  . TONSILLECTOMY      Social  History   Socioeconomic History  . Marital status: Legally Separated    Spouse name: Not on file  . Number of children: 4  . Years of education: 37  . Highest education level: Not on file  Occupational History  . Occupation: Retired  Tobacco Use  . Smoking status: Never Smoker  . Smokeless tobacco: Never Used  Substance and Sexual Activity  . Alcohol use: No  . Drug use: Never  . Sexual activity: Yes  Other Topics Concern  . Not on file  Social History Narrative   Fun/Hobby: Gardening    Denies abuse and feels safe at home.    1 son deceased   Social Determinants of Health   Financial Resource Strain:   . Difficulty of Paying Living Expenses: Not on file  Food  Insecurity:   . Worried About Charity fundraiser in the Last Year: Not on file  . Ran Out of Food in the Last Year: Not on file  Transportation Needs:   . Lack of Transportation (Medical): Not on file  . Lack of Transportation (Non-Medical): Not on file  Physical Activity:   . Days of Exercise per Week: Not on file  . Minutes of Exercise per Session: Not on file  Stress:   . Feeling of Stress : Not on file  Social Connections:   . Frequency of Communication with Friends and Family: Not on file  . Frequency of Social Gatherings with Friends and Family: Not on file  . Attends Religious Services: Not on file  . Active Member of Clubs or Organizations: Not on file  . Attends Archivist Meetings: Not on file  . Marital Status: Not on file  Intimate Partner Violence:   . Fear of Current or Ex-Partner: Not on file  . Emotionally Abused: Not on file  . Physically Abused: Not on file  . Sexually Abused: Not on file     Allergies  Allergen Reactions  . Contrast Media [Iodinated Diagnostic Agents] Anaphylaxis    Per pt she had anaphylaxis reaction to contrast media in the past. States she couldn't breathe and they had to give her epi.   . Adhesive [Tape] Itching  . Penicillins Other (See Comments)     GI upset Has patient had a PCN reaction causing immediate rash, facial/tongue/throat swelling, SOB or lightheadedness with hypotension: No Has patient had a PCN reaction causing severe rash involving mucus membranes or skin necrosis: No Has patient had a PCN reaction that required hospitalization: No Has patient had a PCN reaction occurring within the last 10 years: No If all of the above answers are "NO", then may proceed with Cephalosporin use.     Outpatient Medications Prior to Visit  Medication Sig Dispense Refill  . acetaminophen (TYLENOL) 325 MG tablet Take 2 tablets (650 mg total) by mouth every 6 (six) hours as needed for fever or headache. 30 tablet 0  . albuterol (PROVENTIL) (2.5 MG/3ML) 0.083% nebulizer solution Take 3 mLs (2.5 mg total) by nebulization every 6 (six) hours as needed for wheezing or shortness of breath. 75 mL 12  . albuterol (VENTOLIN HFA) 108 (90 Base) MCG/ACT inhaler Inhale 2 puffs into the lungs every 6 (six) hours as needed for wheezing. 3 Inhaler 1  . apixaban (ELIQUIS) 5 MG TABS tablet Take 1 tablet (5 mg total) by mouth 2 (two) times daily. 60 tablet 3  . ASPIRIN 81 81 MG EC tablet Take 81 mg by mouth. occas    . cetirizine (ZYRTEC) 10 MG tablet Take 1 tablet (10 mg total) by mouth daily. (Patient taking differently: Take 10 mg by mouth as needed. ) 30 tablet 0  . diphenhydrAMINE (BENADRYL) 12.5 MG/5ML elixir Take 12.5 mg by mouth daily as needed.     . famotidine (PEPCID) 20 MG tablet Take 20 mg by mouth daily before breakfast.   3  . ferrous sulfate 325 (65 FE) MG tablet Take 1 tablet (325 mg total) by mouth 3 (three) times daily with meals. (Patient taking differently: Take 325 mg by mouth daily. ) 90 tablet 3  . fluticasone (FLONASE) 50 MCG/ACT nasal spray Place 2 sprays into both nostrils 2 (two) times daily. (Patient taking differently: Place 2 sprays into both nostrils as needed. ) 1 g 2  . furosemide (LASIX) 20 MG  tablet Take 1 tablet (20 mg total) by  mouth 2 (two) times daily.    . isosorbide mononitrate (IMDUR) 30 MG 24 hr tablet Take 1 tablet by mouth as needed.     Marland Kitchen losartan (COZAAR) 100 MG tablet Take 1 tablet by mouth daily.    . magic mouthwash w/lidocaine SOLN Take 5 mLs by mouth 3 (three) times daily as needed for mouth pain. 75 mL 0  . metFORMIN (GLUCOPHAGE XR) 500 MG 24 hr tablet Take 1 tablet (500 mg total) by mouth daily with breakfast. (Patient taking differently: Take 500 mg by mouth 2 (two) times daily. ) 30 tablet 2  . montelukast (SINGULAIR) 10 MG tablet Take 1 tablet (10 mg total) by mouth at bedtime. (Patient taking differently: Take 10 mg by mouth as needed. ) 30 tablet 11  . Multiple Vitamin (MULTIVITAMIN WITH MINERALS) TABS tablet Take 1 tablet by mouth daily. (Patient taking differently: Take 1 tablet by mouth. occas) 30 tablet 3  . nitroGLYCERIN (NITROSTAT) 0.4 MG SL tablet Place 0.4 mg under the tongue every 5 (five) minutes as needed for chest pain.     Marland Kitchen Olopatadine HCl 0.2 % SOLN Apply   into affected eye  as needed    . ondansetron (ZOFRAN ODT) 4 MG disintegrating tablet Take 1 tablet (4 mg total) by mouth every 8 (eight) hours as needed for nausea or vomiting. 20 tablet 0  . pantoprazole (PROTONIX) 40 MG tablet Take 1 tablet daily before first meal 30 tablet 0  . sucralfate (CARAFATE) 1 GM/10ML suspension Take 10 mLs (1 g total) by mouth 4 (four) times daily -  with meals and at bedtime. 420 mL 0  . traMADol (ULTRAM) 50 MG tablet as needed.    Marland Kitchen levofloxacin (LEVAQUIN) 500 MG tablet Take 1 tablet (500 mg total) by mouth daily. 7 tablet 0  . predniSONE (DELTASONE) 10 MG tablet 4 tabs for 2 days, then 3 tabs for 2 days, 2 tabs for 2 days, then 1 tab for 2 days, then stop 20 tablet 0  . predniSONE (DELTASONE) 10 MG tablet Take 4 tabs x 2 days, 2 tabs x 2 days, 1 tab x 2 days and then stop 14 tablet 0   No facility-administered medications prior to visit.    Review of Systems  Constitutional: Negative for chills,  fever, malaise/fatigue and weight loss.  HENT: Negative for hearing loss, sore throat and tinnitus.   Eyes: Negative for blurred vision and double vision.  Respiratory: Positive for cough and shortness of breath. Negative for hemoptysis, sputum production, wheezing and stridor.   Cardiovascular: Negative for chest pain, palpitations, orthopnea, leg swelling and PND.  Gastrointestinal: Negative for abdominal pain, constipation, diarrhea, heartburn, nausea and vomiting.  Genitourinary: Negative for dysuria, hematuria and urgency.  Musculoskeletal: Negative for joint pain and myalgias.  Skin: Negative for itching and rash.  Neurological: Negative for dizziness, tingling, weakness and headaches.  Endo/Heme/Allergies: Negative for environmental allergies. Does not bruise/bleed easily.  Psychiatric/Behavioral: Negative for depression. The patient is not nervous/anxious and does not have insomnia.   All other systems reviewed and are negative.    Objective:  Physical Exam Vitals reviewed.  Constitutional:      General: She is not in acute distress.    Appearance: She is well-developed.  HENT:     Head: Normocephalic and atraumatic.  Eyes:     General: No scleral icterus.    Conjunctiva/sclera: Conjunctivae normal.     Pupils: Pupils are equal, round,  and reactive to light.  Neck:     Vascular: No JVD.     Trachea: No tracheal deviation.  Cardiovascular:     Rate and Rhythm: Normal rate and regular rhythm.     Heart sounds: Normal heart sounds. No murmur.  Pulmonary:     Effort: Pulmonary effort is normal. No tachypnea, accessory muscle usage or respiratory distress.     Breath sounds: No stridor. No wheezing, rhonchi or rales.  Abdominal:     General: Bowel sounds are normal. There is no distension.     Palpations: Abdomen is soft.     Tenderness: There is no abdominal tenderness.  Musculoskeletal:        General: No tenderness.     Cervical back: Neck supple.  Lymphadenopathy:       Cervical: No cervical adenopathy.  Skin:    General: Skin is warm and dry.     Capillary Refill: Capillary refill takes less than 2 seconds.     Findings: No rash.  Neurological:     Mental Status: She is alert and oriented to person, place, and time.  Psychiatric:        Behavior: Behavior normal.      Vitals:   01/24/19 1420  BP: 118/72  Pulse: 74  Temp: (!) 97.2 F (36.2 C)  TempSrc: Temporal  SpO2: 96%  Weight: 147 lb 9.6 oz (67 kg)  Height: _0  (1.575 m)   96% on RA BMI Readings from Last 3 Encounters:  01/24/19 27.00 kg/m  01/16/19 25.97 kg/m  12/13/18 26.83 kg/m   Wt Readings from Last 3 Encounters:  01/24/19 147 lb 9.6 oz (67 kg)  01/16/19 142 lb (64.4 kg)  12/13/18 142 lb (64.4 kg)     CBC    Component Value Date/Time   WBC 6.6 12/10/2018 0829   RBC 4.66 12/10/2018 0829   HGB 11.4 (L) 12/10/2018 0829   HCT 36.0 12/10/2018 0829   PLT 167 12/10/2018 0829   MCV 77.3 (L) 12/10/2018 0829   MCH 24.5 (L) 12/10/2018 0829   MCHC 31.7 12/10/2018 0829   RDW 16.4 (H) 12/10/2018 0829   LYMPHSABS 1.2 12/10/2018 0829   MONOABS 0.5 12/10/2018 0829   EOSABS 0.0 12/10/2018 0829   BASOSABS 0.0 12/10/2018 0829    Chest Imaging: CXR - 01/21/2018 IMPRESSION: Stable cardiomegaly and left greater than right basilar scarring. No active disease. The patient's images have been independently reviewed by me.   Chest x-ray June and July: Consistent with prior median sternotomy history of CABG. July chest x-ray with left-sided pleural effusion.  Bibasilar atelectasis The patient's images have been independently reviewed by me.   Chest x-ray October 2020: Mild cardiomegaly, basilar scarring in the left.  Unclear to me if there is a effusion on the left side. The patient's images have been independently reviewed by me  HRCT chest 11/23/2018: Lower lobe scarring parenchymal banding.  Known previous sternotomy. Small left pleural effusion Hemorrhagic lesion  within the left kidney, large 3.2 x 2.9 cm thyroid gland the lesion. The patient's images have been independently reviewed by me.    Chest x-ray 12/10/2018: Left upper lobe infiltrate The patient's images have been independently reviewed by me.       Pulmonary Functions Testing Results: PFT Results Latest Ref Rng & Units 02/08/2018  FVC-Pre L 1.46  FVC-Predicted Pre % 58  FVC-Post L 1.46  FVC-Predicted Post % 58  Pre FEV1/FVC % % 80  Post FEV1/FCV % % 82  FEV1-Pre L 1.17  FEV1-Predicted Pre % 62  FEV1-Post L 1.19  DLCO UNC% % 67  DLCO COR %Predicted % 112  TLC L 3.41  TLC % Predicted % 76  RV % Predicted % 92    FeNO: None   Pathology: None   Echocardiogram:   Echocardiogram July 2020: Depressed ejection fraction 45 to 50%, left ventricular hypokinesis.  Elevated PA pressures  Heart Catheterization: no reports I can see    Assessment & Plan:     ICD-10-CM   1. Moderate persistent asthma, unspecified whether complicated  N46.27   2. Renal mass  N28.89   3. Thyroid mass  E07.9   4. Acute on chronic left systolic heart failure (HCC)  I50.23     Discussion:  74 year old with history of moderate persistent asthma.  History of community-acquired pneumonia.  Was treated with levofloxacin a month ago.  Recent exacerbation again requiring steroids and antibiotics.  She has HRCT with no evidence of ILD.  She does have inflammatory scarring in the lower lobe.  Of note a kidney lesion and thyroid lesion was found on last imaging.  She is referred to specialist to see this.  From a respiratory standpoint she continues to use her inhalers periodically.  She likes to be on oral prednisone because it makes her breathe better but she understands that there are side effects to being on oral prednisone.  Plan: Since she has had ongoing exacerbations and relative noncompliance with inhaler use as she has difficulty with these inhalers as well I think it probably best to switch her to  nebulizer. We will switch her to Pulmicort twice daily Brovana twice daily Continue albuterol plus ipratropium use as needed as needed for shortness of breath wheezing.  Patient return to clinic in 3 weeks for a phone visit with Wyn Quaker, NP.    Current Outpatient Medications:  .  acetaminophen (TYLENOL) 325 MG tablet, Take 2 tablets (650 mg total) by mouth every 6 (six) hours as needed for fever or headache., Disp: 30 tablet, Rfl: 0 .  albuterol (PROVENTIL) (2.5 MG/3ML) 0.083% nebulizer solution, Take 3 mLs (2.5 mg total) by nebulization every 6 (six) hours as needed for wheezing or shortness of breath., Disp: 75 mL, Rfl: 12 .  albuterol (VENTOLIN HFA) 108 (90 Base) MCG/ACT inhaler, Inhale 2 puffs into the lungs every 6 (six) hours as needed for wheezing., Disp: 3 Inhaler, Rfl: 1 .  apixaban (ELIQUIS) 5 MG TABS tablet, Take 1 tablet (5 mg total) by mouth 2 (two) times daily., Disp: 60 tablet, Rfl: 3 .  ASPIRIN 81 81 MG EC tablet, Take 81 mg by mouth. occas, Disp: , Rfl:  .  cetirizine (ZYRTEC) 10 MG tablet, Take 1 tablet (10 mg total) by mouth daily. (Patient taking differently: Take 10 mg by mouth as needed. ), Disp: 30 tablet, Rfl: 0 .  diphenhydrAMINE (BENADRYL) 12.5 MG/5ML elixir, Take 12.5 mg by mouth daily as needed. , Disp: , Rfl:  .  famotidine (PEPCID) 20 MG tablet, Take 20 mg by mouth daily before breakfast. , Disp: , Rfl: 3 .  ferrous sulfate 325 (65 FE) MG tablet, Take 1 tablet (325 mg total) by mouth 3 (three) times daily with meals. (Patient taking differently: Take 325 mg by mouth daily. ), Disp: 90 tablet, Rfl: 3 .  fluticasone (FLONASE) 50 MCG/ACT nasal spray, Place 2 sprays into both nostrils 2 (two) times daily. (Patient taking differently: Place 2 sprays into both nostrils as needed. ), Disp:  1 g, Rfl: 2 .  furosemide (LASIX) 20 MG tablet, Take 1 tablet (20 mg total) by mouth 2 (two) times daily., Disp: , Rfl:  .  isosorbide mononitrate (IMDUR) 30 MG 24 hr tablet, Take 1  tablet by mouth as needed. , Disp: , Rfl:  .  losartan (COZAAR) 100 MG tablet, Take 1 tablet by mouth daily., Disp: , Rfl:  .  magic mouthwash w/lidocaine SOLN, Take 5 mLs by mouth 3 (three) times daily as needed for mouth pain., Disp: 75 mL, Rfl: 0 .  metFORMIN (GLUCOPHAGE XR) 500 MG 24 hr tablet, Take 1 tablet (500 mg total) by mouth daily with breakfast. (Patient taking differently: Take 500 mg by mouth 2 (two) times daily. ), Disp: 30 tablet, Rfl: 2 .  montelukast (SINGULAIR) 10 MG tablet, Take 1 tablet (10 mg total) by mouth at bedtime. (Patient taking differently: Take 10 mg by mouth as needed. ), Disp: 30 tablet, Rfl: 11 .  Multiple Vitamin (MULTIVITAMIN WITH MINERALS) TABS tablet, Take 1 tablet by mouth daily. (Patient taking differently: Take 1 tablet by mouth. occas), Disp: 30 tablet, Rfl: 3 .  nitroGLYCERIN (NITROSTAT) 0.4 MG SL tablet, Place 0.4 mg under the tongue every 5 (five) minutes as needed for chest pain. , Disp: , Rfl:  .  Olopatadine HCl 0.2 % SOLN, Apply   into affected eye  as needed, Disp: , Rfl:  .  ondansetron (ZOFRAN ODT) 4 MG disintegrating tablet, Take 1 tablet (4 mg total) by mouth every 8 (eight) hours as needed for nausea or vomiting., Disp: 20 tablet, Rfl: 0 .  pantoprazole (PROTONIX) 40 MG tablet, Take 1 tablet daily before first meal, Disp: 30 tablet, Rfl: 0 .  sucralfate (CARAFATE) 1 GM/10ML suspension, Take 10 mLs (1 g total) by mouth 4 (four) times daily -  with meals and at bedtime., Disp: 420 mL, Rfl: 0 .  traMADol (ULTRAM) 50 MG tablet, as needed., Disp: , Rfl:    [name], DO [facility] 01/24/2019 2:42 PM

## 2019-01-29 ENCOUNTER — Other Ambulatory Visit (HOSPITAL_COMMUNITY): Payer: Self-pay | Admitting: Interventional Radiology

## 2019-01-29 DIAGNOSIS — D1771 Benign lipomatous neoplasm of kidney: Secondary | ICD-10-CM

## 2019-01-31 ENCOUNTER — Ambulatory Visit
Admission: RE | Admit: 2019-01-31 | Discharge: 2019-01-31 | Disposition: A | Discharge status: Home / Self Care | Payer: Medicare Other | Source: Ambulatory Visit | Attending: Otolaryngology | Admitting: Otolaryngology

## 2019-01-31 ENCOUNTER — Other Ambulatory Visit (HOSPITAL_COMMUNITY)
Admission: RE | Admit: 2019-01-31 | Discharge: 2019-01-31 | Disposition: A | Discharge status: Home / Self Care | Payer: Medicare Other | Source: Ambulatory Visit | Attending: Otolaryngology | Admitting: Otolaryngology

## 2019-01-31 DIAGNOSIS — E041 Nontoxic single thyroid nodule: Secondary | ICD-10-CM | POA: Diagnosis present

## 2019-02-01 LAB — CYTOLOGY - NON PAP

## 2019-02-07 ENCOUNTER — Telehealth: Payer: Self-pay | Admitting: Pulmonary Disease

## 2019-02-07 ENCOUNTER — Telehealth: Payer: Self-pay | Admitting: Acute Care

## 2019-02-07 DIAGNOSIS — J069 Acute upper respiratory infection, unspecified: Secondary | ICD-10-CM

## 2019-02-07 MED ORDER — ZITHROMAX Z-PAK 250 MG PO TABS: ORAL_TABLET | ORAL | 0 refills | Status: DC

## 2019-02-07 NOTE — Telephone Encounter (Signed)
Pt called the on call line at 6:40 with complaints of thick yellow sputum that she cannot cough up.  She also complains of a sore throat. Patient has not been taking Mucinex to thin secretions. She has not been using her nebulizer treatments. Prescribed a Z-Pak, and asked patient to use Mucinex with a full glass of water to help thin secretions. Triage please follow-up with patient in the morning.  She states that because it is so late she will not be able to go to the pharmacy tonight, but that she will go tomorrow to get the medication. I have encouraged her to use other throat soothing measures, such as hot tea and honey, sips of cool water, and throat lozenges. Please give her a call in the morning and make sure she got the medication, and schedule her for a follow-up appointment as she may need throat culture if she does not show improvement. She denied any fever, sick contact ,or alteration in her sense of taste or smell. We also may need to consider Covid testing if she does not improve. Thanks so much

## 2019-02-07 NOTE — Telephone Encounter (Signed)
Agree pt needs televisit to further evaluate symptoms. If symptoms are acutely concerning we could consider a resp clinic appt to further evaluate pts needs in person.   Wyn Quaker FNP

## 2019-02-07 NOTE — Telephone Encounter (Signed)
Spoke with the pt  She is c/o prod cough with yellow sputum x 2 days  She denies any increased SOB, wheezing, chills, fever, body aches, loss of taste/smell  She states her ears feel stopped up She states her temp today is 97.0  She denies any sick contacts or recent travel  She is asking for abx  Has upcoming appt with Dr Valeta Harms on 02/14/2019  Please advise, thanks!  Allergies  Allergen Reactions  . Contrast Media [Iodinated Diagnostic Agents] Anaphylaxis    Per pt she had anaphylaxis reaction to contrast media in the past. States she couldn't breathe and they had to give her epi.   . Adhesive [Tape] Itching  . Penicillins Other (See Comments)    GI upset Has patient had a PCN reaction causing immediate rash, facial/tongue/throat swelling, SOB or lightheadedness with hypotension: No Has patient had a PCN reaction causing severe rash involving mucus membranes or skin necrosis: No Has patient had a PCN reaction that required hospitalization: No Has patient had a PCN reaction occurring within the last 10 years: No If all of the above answers are "NO", then may proceed with Cephalosporin use.

## 2019-02-07 NOTE — Telephone Encounter (Signed)
PCCM:  The patient is constantly on Abx and this is unlikely bacterial sinus disease. She has congestion frequently and worsening respiratory symptoms due to non-compliance with medication regimens and self-medication. I doubt abx are indicated at this time.   Can we set up a tele-visit with BM for tomorrow?   [facility] 02/07/2019 7:59 PM

## 2019-02-08 ENCOUNTER — Telehealth: Payer: Self-pay | Admitting: Pulmonary Disease

## 2019-02-08 MED ORDER — ZITHROMAX Z-PAK 250 MG PO TABS: ORAL_TABLET | ORAL | 0 refills | Status: DC

## 2019-02-08 NOTE — Telephone Encounter (Signed)
I have already called her LMTCB- needs televisit per Aaron Edelman and Dr Valeta Harms

## 2019-02-08 NOTE — Progress Notes (Signed)
Virtual Visit via Telephone Note  I connected with Sharon Spears on 02/09/19 at 10:00 AM EST by telephone and verified that I am speaking with the correct person using two identifiers.  Location: Patient: Home Provider: [facility] - [address], [place], [place], [place] [address]   I discussed the limitations, risks, security and privacy concerns of performing an evaluation and management service by telephone and the availability of in person appointments. I also discussed with the patient that there may be a patient responsible charge related to this service. The patient expressed understanding and agreed to proceed.  Patient consented to consult via telephone: Yes People present and their role in pt care: Pt   History of Present Illness:  74 year old female never smoker but with prolonged smoke exposure living in [place] for over 50 years referred to our office on 01/26/2018 for prolonged dyspnea.  Also managed for asthma.  PMH: Congestive heart failure Smoker/ Smoking History: Never smoker Maintenance: Brovana, Pulmicort Pt of: Dr. Valeta Harms  Chief complaint: Cough, nasal congestion  74 year old female never smoker followed in our office for asthma.  Patient contacted our office earlier this week reporting that she has had increased nasal congestion, cough and mucus.  Patient was requested to have a follow-up with our office.  Patient called back and spoke with our triage line prior to being scheduled and was treated with azithromycin and a course of prednisone.  Patient completing televisit with our office today to follow-up regarding her symptoms.  She reports she is feeling better since starting azithromycin and prednisone.  She reports that azithromycin is "the only thing that works".  She continues to have persistent nasal drainage as well as increased mucus production in the mornings.  She is currently using Flonase and Zyrtec.  She is not using sinus rinses.   She reports that sinus rinses do help her but she does not do them regularly.  Patient reports that she does not have follow-up scheduled with cardiology or ear nose and throat.  She was last seen by ear nose and throat while she was in the emergency room earlier this month with trouble swallowing.  She is status post a thyroid nodule biopsy.  She has not followed up with ear nose and throat to get those results yet.  Patient reports that she is plans in February/2021 for a procedure by urology.  She believes that her urologist is Dr. Lovena Neighbours with [facility].  A referral was placed in December/2020 for primary care.  Patient still has not established with a new primary care doctor.  Observations/Objective:  02/08/2018-pulmonary function test - FVC 1.46 (58% predicted), postbronchodilator ratio 82, postbronchodilator FEV1 1.19 (63% predicted), no significant bronchodilator response, slight mid flow reversibility after administration of bronchodilators, DLCO is 67 which corrects to 79 based off of hemoglobin.  06/12/2018-IgE-49 06/12/2018-CBC with differential-eosinophils relative 0.6, eosinophils absolute 0  11/23/2018-CT chest high-res-there are areas of parenchymal banding throughout the mid to lower lungs bilaterally which favored to reflect chronic postinfectious or inflammatory scarring, no imaging findings to clearly indicate interstitial lung disease, small left pleural effusion, hemorrhagic lesion in the upper pole of the left kidney, presumably a Angio myolipoma based on comparison with remote prior CT the abdomen pelvis from 07/16/2011.  Consultation with urology for further work-up and management is recommended.  Dilatation of the pulmonic trunk which may suggest pulmonary arterial hypertension, 2 mm nonobstructive calculus in the upper pole collecting system of left kidney, aortic arthrosclerosis,  3.2 x 2.9 cm intermediate attenuation lesion in the left lobe of the thyroid gland is  indeterminate, further evaluation with nonemergent thyroid ultrasound is recommended in the near future to better evaluate this lesion and determine potential need for fine-needle aspiration  07/20/2018-echocardiogram-LV has mildly reduced systolic function, EF of 45 to 50%, cavity size normal, right ventricle has normal systolic function, no increase in right ventricular wall thickness, left atrial size was mildly dilated, right atrial size was mildly dilated, moderate thickening of mitral valve leaflet, moderate calcification of the mitral valve leaflet   Social History   Tobacco Use  Smoking Status Never Smoker  Smokeless Tobacco Never Used   Immunization History  Administered Date(s) Administered  . Influenza, High Dose Seasonal PF 09/24/2018  . Influenza,inj,Quad PF,6+ Mos 11/21/2015  . 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  She continues to call on the triage line or after hours to get azithromycin.  She is averaging antibiotic use every 3 to 4 weeks.  I do not believe that that is helpful or appropriate.  I explained to the patient that there is a low chance that these are actual bacterial cases requiring antibiotics.  I believe she needs to improve her nasal regimen.  Plan: Finish Z-Pak and prednisone that were already started Start nasal saline rinses 2 times daily prior to Flonase use Continue Pulmicort Continue probiotic Keep follow-up with our office for in person evaluation  Renal mass Plan: Keep regular follow-up with [facility] Dr. Lovena Neighbours  Complex care coordination  Patient continues to struggle with medical care follow-up Patient is  complex care needs Patient is difficult to manage telephonically as she is a very scattered thought context over telephonic out reaches  Plan: Our office will personally contacted primary care office to get the patient scheduled I personally went online and requested the patient be scheduled with a provider at [facility] >>> Patient has been scheduled for PCP follow-up on 04/05/2019 at 11 AM with Dr. Einar Pheasant  I emphasized to the patient that she needs to contact cardiology as well as ear nose and throat for follow-up that she admits that she is forgotten to do that  I reminded the patient to keep follow-up with urology as planned  Patient to keep follow-up with our office to ensure that symptoms are improving   Thyroid mass Status post nodule biopsy  Plan: Schedule appointment with ear nose and throat to review biopsy results   Follow Up Instructions:  Return in about 2 weeks (around 02/23/2019), or if symptoms worsen or fail to improve, for Follow up with Dr. Valeta Harms.   I discussed the assessment and treatment plan with the patient. The patient was provided an opportunity to ask questions and all were answered. The patient agreed with the plan and demonstrated an understanding of the instructions.   The patient was advised to call back or seek an in-person evaluation if the symptoms worsen or if the condition fails to improve as anticipated.  I provided 45 minutes of non-face-to-face time during this encounter.   Lauraine Rinne, NP

## 2019-02-08 NOTE — Telephone Encounter (Signed)
[facility] called, pt was at pharmacy. Zpak was not e-scribed, it was printed. Spoke with tech at pharmacy and sent in zpak as directed by SG on 02/07/19.  Will await call back from pt to schedule visit. (See phone note from 02/07/19).

## 2019-02-08 NOTE — Telephone Encounter (Signed)
ATC, NA LMTCB

## 2019-02-08 NOTE — Telephone Encounter (Signed)
Spoke with pt and scheduled her for televisit with NP for eval as requested on previous phone notes  Nothing further needed

## 2019-02-08 NOTE — Addendum Note (Signed)
Addended by: Collier Salina on: 02/08/2019 12:48 PM   Modules accepted: Orders

## 2019-02-09 ENCOUNTER — Other Ambulatory Visit: Payer: Self-pay

## 2019-02-09 ENCOUNTER — Encounter: Payer: Self-pay | Admitting: Pulmonary Disease

## 2019-02-09 ENCOUNTER — Ambulatory Visit (INDEPENDENT_AMBULATORY_CARE_PROVIDER_SITE_OTHER): Payer: Medicare Other | Admitting: Pulmonary Disease

## 2019-02-09 DIAGNOSIS — Z7189 Other specified counseling: Secondary | ICD-10-CM

## 2019-02-09 DIAGNOSIS — J4540 Moderate persistent asthma, uncomplicated: Secondary | ICD-10-CM | POA: Diagnosis not present

## 2019-02-09 DIAGNOSIS — I5023 Acute on chronic systolic (congestive) heart failure: Secondary | ICD-10-CM

## 2019-02-09 DIAGNOSIS — E079 Disorder of thyroid, unspecified: Secondary | ICD-10-CM

## 2019-02-09 DIAGNOSIS — N2889 Other specified disorders of kidney and ureter: Secondary | ICD-10-CM

## 2019-02-09 NOTE — Assessment & Plan Note (Signed)
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  She continues to call on the triage line or after hours to get azithromycin.  She is averaging antibiotic use every 3 to 4 weeks.  I do not believe that that is helpful or appropriate.  I explained to the patient that there is a low chance that these are actual bacterial cases requiring antibiotics.  I believe she needs to improve her nasal regimen.  Plan: Finish Z-Pak and prednisone that were already started Start nasal saline rinses 2 times daily prior to Flonase use Continue Pulmicort Continue probiotic Keep follow-up with our office for in person evaluation

## 2019-02-09 NOTE — Assessment & Plan Note (Signed)
Plan: Keep regular follow-up with [facility] Dr. Winter 

## 2019-02-09 NOTE — Assessment & Plan Note (Signed)
Status post nodule biopsy  Plan: Schedule appointment with ear nose and throat to review biopsy results

## 2019-02-09 NOTE — Assessment & Plan Note (Signed)
  Patient continues to struggle with medical care follow-up Patient is complex care needs Patient is difficult to manage telephonically as she is a very scattered thought context over telephonic out reaches  Plan: Our office will personally contacted primary care office to get the patient scheduled I personally went online and requested the patient be scheduled with a provider at [facility] >>> Patient has been scheduled for PCP follow-up on 04/05/2019 at 11 AM with Dr. Einar Pheasant  I emphasized to the patient that she needs to contact cardiology as well as ear nose and throat for follow-up that she admits that she is forgotten to do that  I reminded the patient to keep follow-up with urology as planned  Patient to keep follow-up with our office to ensure that symptoms are improving

## 2019-02-09 NOTE — Assessment & Plan Note (Signed)
Plan: Contact cardiology today for follow-up visit Continue to follow-up with cardiology

## 2019-02-09 NOTE — Patient Instructions (Addendum)
You were seen today by Lauraine Rinne, NP  for:   1. Moderate persistent asthma, unspecified whether complicated  - Ambulatory referral to Internal Medicine  Okay to finish azithromycin and prednisone that you already started  I do not believe that you need additional doses of antibiotics until you have in person evaluation.  I do not believe you should be treated telephonically.  You are receiving way too many antibiotics it is not safe.  Continue Pulmicort nebs Continue Brovana nebs  Continue Zyrtec Continue Flonase  Start nasal saline rinses twice daily, prior to Flonase use  2. Renal mass  Keep follow-up with urology Dr. Lovena Neighbours  3. Complex care coordination  You have been scheduled for a primary care follow-up with Dr. Einar Pheasant at [facility] on 04/05/2019 at 11 AM  4. Thyroid mass  Schedule follow-up with ear nose and throat  5. Acute on chronic left systolic heart failure ([place])  Schedule follow-up with cardiology   We recommend today:  Orders Placed This Encounter  Procedures  . Ambulatory referral to Internal Medicine    Referral Priority:   Routine    Referral Type:   Consultation    Referral Reason:   Specialty Services Required    Requested Specialty:   Internal Medicine    Number of Visits Requested:   1   Orders Placed This Encounter  Procedures  . Ambulatory referral to Internal Medicine   No orders of the defined types were placed in this encounter.   Follow Up:    Return in about 2 weeks (around 02/23/2019), or if symptoms worsen or fail to improve, for Follow up with Dr. Valeta Harms.   Please do your part to reduce the spread of COVID-19:      Reduce your risk of any infection  and COVID19 by using the similar precautions used for avoiding the common cold or flu:  Marland Kitchen Wash your hands often with soap and warm water for at least 20 seconds.  If soap and water are not readily available, use an alcohol-based hand sanitizer with at least 60%  alcohol.  . If coughing or sneezing, cover your mouth and nose by coughing or sneezing into the elbow areas of your shirt or coat, into a tissue or into your sleeve (not your hands). Langley Gauss A MASK when in public  . Avoid shaking hands with others and consider head nods or verbal greetings only. . Avoid touching your eyes, nose, or mouth with unwashed hands.  . Avoid close contact with people who are sick. . Avoid places or events with large numbers of people in one location, like concerts or sporting events. . If you have some symptoms but not all symptoms, continue to monitor at home and seek medical attention if your symptoms worsen. . If you are having a medical emergency, call 911.   [place] / e-Visit: [email or web address]         [facility]: [facility]: [phone]                   [facility]: [phone]     It is flu season:   >>> Best ways to protect herself from the flu: Receive the yearly flu vaccine, practice good hand hygiene washing with soap and also using hand sanitizer when available, eat a nutritious meals, get adequate rest, hydrate appropriately   Please contact the office if your  symptoms worsen or you have concerns that you are not improving.   Thank you for choosing [facility] for your healthcare, and for allowing [place] to partner with you on your healthcare journey. I am thankful to be able to provide care to you today.   Wyn Quaker FNP-C

## 2019-02-13 NOTE — Progress Notes (Signed)
PCCM: I agree. She needs to establish with a PCP.  [name], DO [facility] 02/13/2019 5:06 PM

## 2019-02-14 ENCOUNTER — Encounter: Payer: Self-pay | Admitting: Pulmonary Disease

## 2019-02-14 ENCOUNTER — Other Ambulatory Visit: Payer: Self-pay

## 2019-02-14 ENCOUNTER — Ambulatory Visit (INDEPENDENT_AMBULATORY_CARE_PROVIDER_SITE_OTHER): Payer: Medicare Other | Admitting: Pulmonary Disease

## 2019-02-14 VITALS — BP 130/80 | HR 66 | Temp 98.3°F | Ht 62.0 in | Wt 150.8 lb

## 2019-02-14 DIAGNOSIS — R0600 Dyspnea, unspecified: Secondary | ICD-10-CM

## 2019-02-14 DIAGNOSIS — Z5181 Encounter for therapeutic drug level monitoring: Secondary | ICD-10-CM | POA: Insufficient documentation

## 2019-02-14 DIAGNOSIS — J4550 Severe persistent asthma, uncomplicated: Secondary | ICD-10-CM

## 2019-02-14 DIAGNOSIS — R05 Cough: Secondary | ICD-10-CM

## 2019-02-14 DIAGNOSIS — R0609 Other forms of dyspnea: Secondary | ICD-10-CM

## 2019-02-14 DIAGNOSIS — R059 Cough, unspecified: Secondary | ICD-10-CM

## 2019-02-14 DIAGNOSIS — J411 Mucopurulent chronic bronchitis: Secondary | ICD-10-CM | POA: Diagnosis not present

## 2019-02-14 DIAGNOSIS — Z7189 Other specified counseling: Secondary | ICD-10-CM

## 2019-02-14 MED ORDER — AZITHROMYCIN 250 MG PO TABS: 250.0000 mg | ORAL_TABLET | ORAL | 0 refills | Status: DC

## 2019-02-14 NOTE — Progress Notes (Signed)
Synopsis: Referred in Jan. 2020 for coughing, pain, headache by Sonia Side., FNP  Subjective:   PATIENT ID: Sharon Spears GENDER: female DOB: Jun 18, 1945, MRN: [id number]  Chief Complaint  Patient presents with  . Follow-up    f/u Moderate persistent asthma. Patient having SOB with exertion    This is a 74 year old with complaints of fever and body aches.  She was recently treated with an upper respiratory tract infection approximately month ago.  She was seen in the emergency room.  She was referred to the pulmonologist office for respiratory complaints following this.  She does have known congestive heart failure.  She is a lifelong non-smoker.  But she did spend nearly 50 years in [place].  She is concerned for air pollution and smog affect to her pulmonary function.  Over the past several weeks she has had progressive dyspnea.  She states that she has been compliant with her congestive heart failure regimen.  She has noticed some mild increase in the swelling of her legs.  She recently followed up with her cardiologist last week.  She does have cough that is productive at times, chest tightness, denies hemoptysis.  OV 09/05/2018: Since last office visit patient has had multiple episodes of recurrent cough and congestion.  She has been seen by 1 of our nurse practitioners in March, ER April x2, office June, ER July for all similar symptoms.  Called into the office again on August 21 with complaints of congestion.  Chest imaging in June stable consistent with prior CABG and sternotomy. Chest x-ray in July with basilar atelectasis in the left pleural effusion.  Echocardiogram in July 2020 with a depressed ejection fraction 45 to 50%.  Left ventricular hypokinesis. She wakes up every night with coughing symptoms. At night time she had worse symptoms.  He has ongoing nocturnal wheezing.  She always feels like she has a tickle in her throat.  During the daytime is better.  Nighttime is much  worse.  She will occasionally steam a pot of hot water and breathe in steam in the middle of the night which helps soothe her coughing spells.  OV 11/01/2018: Patient last seen in the office in August.  Unfortunately recently had an exacerbation requiring antibiotics.  Possible viral URI symptoms.  She was treated with antibiotics.  She also had a migraine was seen in the emergency room this was also treated.  She was Covid tested which was negative.  She is followed by cardiology for chronic systolic heart failure.  She takes her medications regularly.  Has a history of asthma.  Uses her Symbicort daily.  Her symptoms at this point are better.  We reviewed her pulmonary function test that were completed back in January.  We looked at her chest x-ray that was recently completed in the ER.  She still has some left-sided elevated hemidiaphragm plus scarring in the left base.  History of CABG.  OV 12/13/2018: This is Sharon Spears here today for follow-up after recent ER visit.  Unfortunately found to have left upper lobe infiltrate.  Was treated for community-acquired pneumonia and given Levaquin.  Overall at this time she is breathing much better.  She denies any fevers chills night sweats weight loss.  No cough no sputum production.  She is sleeping better.  She was given doxycycline and prednisone via telemetry visit with Wyn Quaker, NP.  Patient unfortunately had insomnia.  She thinks this was related to the doxycycline however more likely related  to the prednisone dosing.  However she is breathing better.  She does present very tearful today and very sad that she is unable to visit with family.  She has a daughter in [place] who is a Marine scientist and a son who is a [place] [employer] in [place].  She spends most of the time alone and she is very sad about not being able to be with people.  But she understands the importance of this right now.    OV 01/24/2019: Patient doing better today.  Recently treated for  exacerbation with steroids again.  She routinely feels better after treatment with steroids.  Unfortunately she does not use a regular maintenance inhaler.  She finds several excuses not to use her Symbicort inhaler.  When she does use it she does feel better but routinely forgets to use it then will have exacerbations requiring shortness of breath and steroids again.  She is very anxious at baseline and feels anxious when she is home alone.  I believe this plays some into her symptoms.  Today overall feeling better than she was before.  Did have follow-up with ENT with planned biopsy of the thyroid nodule.  Additionally has a renal mass that has seen urology with plans for routine image follow-up.  At least this is what she told me.  She states to me that she prefers using her nebulizer than using her inhaler she does not feel like she is able to operate the inhalers appropriately.  OV 02/14/2019: Recently seen by telephone visit.  Treated for a sinus infection.  Today, still having recurrent exacerbations and steroids recently requiring antibiotics.  Recently completed another course due to chest congestion nasal congestion sinus issues.  She recently had a biopsy of the thyroid nodule.  She has follow-up with [facility]NT for this.  At this time denies congestion fevers cough night sweats weight loss or chills.    Past Medical History:  Diagnosis Date  . Adjustment disorder with anxiety   . Asthma   . Atrial fibrillation ([place])   . Benign essential hypertension   . COPD (chronic obstructive pulmonary disease) ([place])   . Coronary artery disease   . Diabetic neuropathic arthritis ([place])   . Heart palpitations   . Hypoxia 07/19/2018  . Other cirrhosis of liver ([place]) 07/20/2018  . Uncontrolled diabetes mellitus type 2 without complications      Family History  Problem Relation Age of Onset  . Asthma Mother   . Alcohol abuse Father      Past Surgical History:  Procedure Laterality Date  .  CARDIAC SURGERY    . CESAREAN SECTION    . CORONARY ARTERY BYPASS GRAFT  07/06/2017   Redo-sternotomy and CABG 2 with LIMA to LAD, SVG to OM, and a a closure, mitral valve repair with #30 mm [place] ring on 07/06/2017  . ESOPHAGEAL MANOMETRY N/A 03/06/2012   Procedure: ESOPHAGEAL MANOMETRY (EM);  Surgeon: Beryle Beams, MD;  Location: [facility] ENDOSCOPY;  Service: Endoscopy;  Laterality: N/A;  . TONSILLECTOMY      Social History   Socioeconomic History  . Marital status: Legally Separated    Spouse name: Not on file  . Number of children: 4  . Years of education: 65  . Highest education level: Not on file  Occupational History  . Occupation: Retired  Tobacco Use  . Smoking status: Never Smoker  . Smokeless tobacco: Never Used  Substance and Sexual Activity  . Alcohol use: No  .  Drug use: Never  . Sexual activity: Yes  Other Topics Concern  . Not on file  Social History Narrative   Fun/Hobby: Gardening    Denies abuse and feels safe at home.    1 son deceased   Social Determinants of Health   Financial Resource Strain:   . Difficulty of Paying Living Expenses: Not on file  Food Insecurity:   . Worried About Charity fundraiser in the Last Year: Not on file  . Ran Out of Food in the Last Year: Not on file  Transportation Needs:   . Lack of Transportation (Medical): Not on file  . Lack of Transportation (Non-Medical): Not on file  Physical Activity:   . Days of Exercise per Week: Not on file  . Minutes of Exercise per Session: Not on file  Stress:   . Feeling of Stress : Not on file  Social Connections:   . Frequency of Communication with Friends and Family: Not on file  . Frequency of Social Gatherings with Friends and Family: Not on file  . Attends Religious Services: Not on file  . Active Member of Clubs or Organizations: Not on file  . Attends Archivist Meetings: Not on file  . Marital Status: Not on file  Intimate Partner Violence:   . Fear of Current  or Ex-Partner: Not on file  . Emotionally Abused: Not on file  . Physically Abused: Not on file  . Sexually Abused: Not on file     Allergies  Allergen Reactions  . Contrast Media [Iodinated Diagnostic Agents] Anaphylaxis    Per pt she had anaphylaxis reaction to contrast media in the past. States she couldn't breathe and they had to give her epi.   . Adhesive [Tape] Itching  . Penicillins Other (See Comments)    GI upset Has patient had a PCN reaction causing immediate rash, facial/tongue/throat swelling, SOB or lightheadedness with hypotension: No Has patient had a PCN reaction causing severe rash involving mucus membranes or skin necrosis: No Has patient had a PCN reaction that required hospitalization: No Has patient had a PCN reaction occurring within the last 10 years: No If all of the above answers are "NO", then may proceed with Cephalosporin use.     Outpatient Medications Prior to Visit  Medication Sig Dispense Refill  . acetaminophen (TYLENOL) 325 MG tablet Take 2 tablets (650 mg total) by mouth every 6 (six) hours as needed for fever or headache. 30 tablet 0  . albuterol (PROVENTIL) (2.5 MG/3ML) 0.083% nebulizer solution Take 3 mLs (2.5 mg total) by nebulization every 6 (six) hours as needed for wheezing or shortness of breath. 75 mL 12  . albuterol (VENTOLIN HFA) 108 (90 Base) MCG/ACT inhaler Inhale 2 puffs into the lungs every 6 (six) hours as needed for wheezing. 3 Inhaler 1  . apixaban (ELIQUIS) 5 MG TABS tablet Take 1 tablet (5 mg total) by mouth 2 (two) times daily. 60 tablet 3  . arformoterol (BROVANA) 15 MCG/2ML NEBU Take 2 mLs (15 mcg total) by nebulization 2 (two) times daily. 120 mL 6  . Blood Glucose Monitoring Suppl (ACCU-CHEK AVIVA PLUS) w/Device KIT USE TO CHECK BLOOD SUGARS ONCE DAILY    . budesonide (PULMICORT) 0.25 MG/2ML nebulizer solution Take 2 mLs (0.25 mg total) by nebulization 2 (two) times daily. 120 mL 12  . cetirizine (ZYRTEC) 10 MG tablet Take 1  tablet (10 mg total) by mouth daily. (Patient taking differently: Take 10 mg by mouth as needed. )  30 tablet 0  . diphenhydrAMINE (BENADRYL) 12.5 MG/5ML elixir Take 12.5 mg by mouth daily as needed.     . famotidine (PEPCID) 20 MG tablet Take 20 mg by mouth daily before breakfast.   3  . ferrous sulfate 325 (65 FE) MG tablet Take 1 tablet (325 mg total) by mouth 3 (three) times daily with meals. (Patient taking differently: Take 325 mg by mouth daily. ) 90 tablet 3  . fluticasone (FLONASE) 50 MCG/ACT nasal spray Place 2 sprays into both nostrils 2 (two) times daily. (Patient taking differently: Place 2 sprays into both nostrils as needed. ) 1 g 2  . furosemide (LASIX) 20 MG tablet Take 1 tablet (20 mg total) by mouth 2 (two) times daily.    Marland Kitchen glucose blood (ACCU-CHEK AVIVA PLUS) test strip USE WITH GLUCOMETER TO CHECK BLOOD SUGARS ONCE DAILY    . isosorbide mononitrate (IMDUR) 30 MG 24 hr tablet Take 1 tablet by mouth as needed.     . magic mouthwash w/lidocaine SOLN Take 5 mLs by mouth 3 (three) times daily as needed for mouth pain. 75 mL 0  . metFORMIN (GLUCOPHAGE XR) 500 MG 24 hr tablet Take 1 tablet (500 mg total) by mouth daily with breakfast. (Patient taking differently: Take 500 mg by mouth 2 (two) times daily. ) 30 tablet 2  . montelukast (SINGULAIR) 10 MG tablet Take 1 tablet (10 mg total) by mouth at bedtime. (Patient taking differently: Take 10 mg by mouth as needed. ) 30 tablet 11  . Multiple Vitamin (MULTIVITAMIN WITH MINERALS) TABS tablet Take 1 tablet by mouth daily. (Patient taking differently: Take 1 tablet by mouth. occas) 30 tablet 3  . nitroGLYCERIN (NITROSTAT) 0.4 MG SL tablet Place 0.4 mg under the tongue every 5 (five) minutes as needed for chest pain.     . pantoprazole (PROTONIX) 40 MG tablet Take 1 tablet daily before first meal 30 tablet 0  . Olopatadine HCl 0.2 % SOLN Apply   into affected eye  as needed    . ZITHROMAX Z-PAK 250 MG tablet Take two tablets today Then take  one tablet daily until gone. 6 each 0  . ipratropium-albuterol (DUONEB) 0.5-2.5 (3) MG/3ML SOLN Take 3 mLs by nebulization every 4 (four) hours as needed. (Patient not taking: Reported on 02/14/2019) 360 mL 12   No facility-administered medications prior to visit.    Review of Systems  Constitutional: Negative for chills, fever, malaise/fatigue and weight loss.  HENT: Negative for hearing loss, sore throat and tinnitus.   Eyes: Negative for blurred vision and double vision.  Respiratory: Negative for cough, hemoptysis, sputum production, shortness of breath, wheezing and stridor.   Cardiovascular: Negative for chest pain, palpitations, orthopnea, leg swelling and PND.  Gastrointestinal: Negative for abdominal pain, constipation, diarrhea, heartburn, nausea and vomiting.  Genitourinary: Negative for dysuria, hematuria and urgency.  Musculoskeletal: Positive for back pain and myalgias. Negative for joint pain.  Skin: Negative for itching and rash.  Neurological: Negative for dizziness, tingling, weakness and headaches.  Endo/Heme/Allergies: Negative for environmental allergies. Does not bruise/bleed easily.  Psychiatric/Behavioral: Negative for depression. The patient is not nervous/anxious and does not have insomnia.   All other systems reviewed and are negative.    Objective:  Physical Exam Vitals reviewed.  Constitutional:      General: She is not in acute distress.    Appearance: She is well-developed.     Comments: Elderly fm   HENT:     Head: Normocephalic and atraumatic.  Eyes:     General: No scleral icterus.    Conjunctiva/sclera: Conjunctivae normal.     Pupils: Pupils are equal, round, and reactive to light.  Neck:     Vascular: No JVD.     Trachea: No tracheal deviation.  Cardiovascular:     Rate and Rhythm: Normal rate. Rhythm irregular.     Heart sounds: Normal heart sounds. No murmur.  Pulmonary:     Effort: Pulmonary effort is normal. No tachypnea, accessory  muscle usage or respiratory distress.     Breath sounds: No stridor. Rhonchi present. No wheezing or rales.  Musculoskeletal:        General: No tenderness.     Cervical back: Neck supple.  Lymphadenopathy:     Cervical: No cervical adenopathy.  Skin:    General: Skin is warm and dry.     Capillary Refill: Capillary refill takes less than 2 seconds.     Findings: No rash.  Neurological:     Mental Status: She is alert and oriented to person, place, and time.  Psychiatric:        Behavior: Behavior normal.      Vitals:   02/14/19 1050  BP: 130/80  Pulse: 66  Temp: 98.3 F (36.8 C)  TempSrc: Temporal  SpO2: 98%  Weight: 150 lb 12.8 oz (68.4 kg)  Height: _0  (1.575 m)   98% on RA BMI Readings from Last 3 Encounters:  02/14/19 27.58 kg/m  01/24/19 27.00 kg/m  01/16/19 25.97 kg/m   Wt Readings from Last 3 Encounters:  02/14/19 150 lb 12.8 oz (68.4 kg)  01/24/19 147 lb 9.6 oz (67 kg)  01/16/19 142 lb (64.4 kg)     CBC    Component Value Date/Time   WBC 6.6 12/10/2018 0829   RBC 4.66 12/10/2018 0829   HGB 11.4 (L) 12/10/2018 0829   HCT 36.0 12/10/2018 0829   PLT 167 12/10/2018 0829   MCV 77.3 (L) 12/10/2018 0829   MCH 24.5 (L) 12/10/2018 0829   MCHC 31.7 12/10/2018 0829   RDW 16.4 (H) 12/10/2018 0829   LYMPHSABS 1.2 12/10/2018 0829   MONOABS 0.5 12/10/2018 0829   EOSABS 0.0 12/10/2018 0829   BASOSABS 0.0 12/10/2018 0829    Chest Imaging: CXR - 01/21/2018 IMPRESSION: Stable cardiomegaly and left greater than right basilar scarring. No active disease. The patient's images have been independently reviewed by me.   Chest x-ray June and July: Consistent with prior median sternotomy history of CABG. July chest x-ray with left-sided pleural effusion.  Bibasilar atelectasis The patient's images have been independently reviewed by me.   Chest x-ray October 2020: Mild cardiomegaly, basilar scarring in the left.  Unclear to me if there is a effusion on the  left side. The patient's images have been independently reviewed by me  HRCT chest 11/23/2018: Lower lobe scarring parenchymal banding.  Known previous sternotomy. Small left pleural effusion Hemorrhagic lesion within the left kidney, large 3.2 x 2.9 cm thyroid gland the lesion. The patient's images have been independently reviewed by me.    Chest x-ray 12/10/2018: Left upper lobe infiltrate The patient's images have been independently reviewed by me.       Pulmonary Functions Testing Results: PFT Results Latest Ref Rng & Units 02/08/2018  FVC-Pre L 1.46  FVC-Predicted Pre % 58  FVC-Post L 1.46  FVC-Predicted Post % 58  Pre FEV1/FVC % % 80  Post FEV1/FCV % % 82  FEV1-Pre L 1.17  FEV1-Predicted Pre % 62  FEV1-Post L 1.19  DLCO UNC% % 67  DLCO COR %Predicted % 112  TLC L 3.41  TLC % Predicted % 76  RV % Predicted % 92    FeNO: None   Pathology: None   Echocardiogram:   Echocardiogram July 2020: Depressed ejection fraction 45 to 50%, left ventricular hypokinesis.  Elevated PA pressures  Heart Catheterization: no reports I can see    Assessment & Plan:     ICD-10-CM   1. Medication monitoring encounter  Z51.81 EKG 12-Lead  2. Severe persistent asthma, unspecified whether complicated  A07.62   3. Complex care coordination  Z71.89   4. DOE (dyspnea on exertion)  R06.00   5. Cough  R05   6. Mucopurulent chronic bronchitis (HCC)  J41.1 Respiratory or Resp and Sputum Culture    AFB Culture & Smear    Fungus Culture & Smear    Discussion:  This is a 74 year old with severe persistent asthma symptoms, recurrent chronic bronchitis.  She has been treated on and off with antibiotics and steroids.  Recent HRCT with no evidence of ILD she does have inflammatory lower lobe scarring.  She has a follow-up with her kidney lesion and thyroid lesion from previous imaging already scheduled.  She has been on multiple courses of oral prednisone.  We would like to help prevent  this.  Plan: Due to her recurrent exacerbations and somewhat noncompliance with inhaler use we will place her on azithromycin 250 mg Monday Wednesday Friday due to recurrent chronic bronchitis symptoms. We will obtain an EKG today in the office to look at her QTC 454, atrial fibrillation.  EKG reviewed today in the office We will also obtain sputum cultures today. I think she should continue on nebulized therapy Pulmicort twice daily, Brovana twice daily And she can continue the use of her albuterol ipratropium nebulized for shortness of breath and wheezing.  Discussed today the risks of sensorineural hearing loss on chronic azithromycin therapy as well as the potential for prolonged QT.  Patient agrees and accepts these risks she had several questions regarding this but she felt like it was worth trying.  I explained that these risks are very small but we need to at least discuss them.  Greater than 50% of this patient's 35-minute of visit was spent face-to-face discussing above recommendations and treatment plan.  As well as review of recent images, as well as review of ECG completed in today's office visit.  [facility] 02/14/2019 11:02 AM

## 2019-02-14 NOTE — Patient Instructions (Addendum)
Thank you for visiting Dr. Valeta Harms at [facility]. Today we recommend the following: Orders Placed This Encounter  Procedures  . EKG 12-Lead   Continue Pulmicort, Brovana, duo nebs with nebulizer machine. Start azithromycin Monday Wednesday Friday, 250 mg Sputum cultures today EKG 12-lead to look at QTC today  Return in about 3 weeks (around 03/07/2019) for with Wyn Quaker, NP .    Please do your part to reduce the spread of COVID-19.

## 2019-02-15 ENCOUNTER — Other Ambulatory Visit: Payer: Self-pay | Admitting: Student

## 2019-02-15 ENCOUNTER — Other Ambulatory Visit: Payer: Self-pay | Admitting: Radiology

## 2019-02-16 ENCOUNTER — Ambulatory Visit (HOSPITAL_COMMUNITY)
Admission: RE | Admit: 2019-02-16 | Discharge: 2019-02-16 | Disposition: A | Discharge status: Home / Self Care | Payer: Medicare Other | Source: Ambulatory Visit | Attending: Radiology | Admitting: Radiology

## 2019-02-16 ENCOUNTER — Ambulatory Visit (HOSPITAL_COMMUNITY)
Admission: RE | Admit: 2019-02-16 | Discharge: 2019-02-16 | Disposition: A | Discharge status: Home / Self Care | Payer: Medicare Other | Source: Ambulatory Visit | Attending: Interventional Radiology | Admitting: Interventional Radiology

## 2019-02-16 ENCOUNTER — Other Ambulatory Visit (HOSPITAL_COMMUNITY): Payer: Self-pay | Admitting: Interventional Radiology

## 2019-02-16 ENCOUNTER — Other Ambulatory Visit: Payer: Self-pay

## 2019-02-16 DIAGNOSIS — K7460 Unspecified cirrhosis of liver: Secondary | ICD-10-CM | POA: Insufficient documentation

## 2019-02-16 DIAGNOSIS — Z880 Allergy status to penicillin: Secondary | ICD-10-CM | POA: Diagnosis not present

## 2019-02-16 DIAGNOSIS — Z91041 Radiographic dye allergy status: Secondary | ICD-10-CM | POA: Insufficient documentation

## 2019-02-16 DIAGNOSIS — Z7984 Long term (current) use of oral hypoglycemic drugs: Secondary | ICD-10-CM | POA: Diagnosis not present

## 2019-02-16 DIAGNOSIS — D1771 Benign lipomatous neoplasm of kidney: Secondary | ICD-10-CM | POA: Insufficient documentation

## 2019-02-16 DIAGNOSIS — I2510 Atherosclerotic heart disease of native coronary artery without angina pectoris: Secondary | ICD-10-CM | POA: Insufficient documentation

## 2019-02-16 DIAGNOSIS — Z951 Presence of aortocoronary bypass graft: Secondary | ICD-10-CM | POA: Insufficient documentation

## 2019-02-16 DIAGNOSIS — Z7901 Long term (current) use of anticoagulants: Secondary | ICD-10-CM | POA: Insufficient documentation

## 2019-02-16 DIAGNOSIS — I10 Essential (primary) hypertension: Secondary | ICD-10-CM | POA: Insufficient documentation

## 2019-02-16 DIAGNOSIS — I4891 Unspecified atrial fibrillation: Secondary | ICD-10-CM | POA: Insufficient documentation

## 2019-02-16 DIAGNOSIS — E1140 Type 2 diabetes mellitus with diabetic neuropathy, unspecified: Secondary | ICD-10-CM | POA: Insufficient documentation

## 2019-02-16 DIAGNOSIS — J449 Chronic obstructive pulmonary disease, unspecified: Secondary | ICD-10-CM | POA: Diagnosis not present

## 2019-02-16 DIAGNOSIS — Z7951 Long term (current) use of inhaled steroids: Secondary | ICD-10-CM | POA: Insufficient documentation

## 2019-02-16 DIAGNOSIS — Z79899 Other long term (current) drug therapy: Secondary | ICD-10-CM | POA: Diagnosis not present

## 2019-02-16 DIAGNOSIS — R0989 Other specified symptoms and signs involving the circulatory and respiratory systems: Secondary | ICD-10-CM | POA: Insufficient documentation

## 2019-02-16 DIAGNOSIS — Z01818 Encounter for other preprocedural examination: Secondary | ICD-10-CM

## 2019-02-16 HISTORY — PX: IR US GUIDE VASC ACCESS RIGHT: IMG2390

## 2019-02-16 HISTORY — PX: IR ANGIOGRAM SELECTIVE EACH ADDITIONAL VESSEL: IMG667

## 2019-02-16 HISTORY — PX: IR RENAL SELECTIVE  UNI INC S&I MOD SED: IMG654

## 2019-02-16 LAB — CBC WITH DIFFERENTIAL/PLATELET
Abs Immature Granulocytes: 0.03 10*3/uL (ref 0.00–0.07)
Basophils Absolute: 0 10*3/uL (ref 0.0–0.1)
Basophils Relative: 0 %
Eosinophils Absolute: 0 10*3/uL (ref 0.0–0.5)
Eosinophils Relative: 0 %
HCT: 38.5 % (ref 36.0–46.0)
Hemoglobin: 11.9 g/dL — ABNORMAL LOW (ref 12.0–15.0)
Immature Granulocytes: 1 %
Lymphocytes Relative: 11 %
Lymphs Abs: 0.5 10*3/uL — ABNORMAL LOW (ref 0.7–4.0)
MCH: 23.9 pg — ABNORMAL LOW (ref 26.0–34.0)
MCHC: 30.9 g/dL (ref 30.0–36.0)
MCV: 77.3 fL — ABNORMAL LOW (ref 80.0–100.0)
Monocytes Absolute: 0.1 10*3/uL (ref 0.1–1.0)
Monocytes Relative: 1 %
Neutro Abs: 3.6 10*3/uL (ref 1.7–7.7)
Neutrophils Relative %: 87 %
Platelets: 147 10*3/uL — ABNORMAL LOW (ref 150–400)
RBC: 4.98 MIL/uL (ref 3.87–5.11)
RDW: 16.5 % — ABNORMAL HIGH (ref 11.5–15.5)
WBC: 4.1 10*3/uL (ref 4.0–10.5)
nRBC: 0 % (ref 0.0–0.2)

## 2019-02-16 LAB — BASIC METABOLIC PANEL
Anion gap: 11 (ref 5–15)
BUN: 24 mg/dL — ABNORMAL HIGH (ref 8–23)
CO2: 20 mmol/L — ABNORMAL LOW (ref 22–32)
Calcium: 9.5 mg/dL (ref 8.9–10.3)
Chloride: 103 mmol/L (ref 98–111)
Creatinine, Ser: 0.92 mg/dL (ref 0.44–1.00)
GFR calc Af Amer: >60 mL/min (ref >60)
GFR calc non Af Amer: >60 mL/min (ref >60)
Glucose, Bld: 241 mg/dL — ABNORMAL HIGH (ref 70–99)
Potassium: 4.1 mmol/L (ref 3.5–5.1)
Sodium: 134 mmol/L — ABNORMAL LOW (ref 135–145)

## 2019-02-16 LAB — PROTIME-INR
INR: 1.1 (ref 0.8–1.2)
Prothrombin Time: 14.4 seconds (ref 11.4–15.2)

## 2019-02-16 MED ORDER — MIDAZOLAM HCL 2 MG/2ML IJ SOLN
INTRAMUSCULAR | Status: AC | PRN
  Administered 2019-02-16 (×6): 1 mg via INTRAVENOUS

## 2019-02-16 MED ORDER — VANCOMYCIN HCL IN DEXTROSE 1-5 GM/200ML-% IV SOLN
1000.0000 mg | Freq: Once | INTRAVENOUS | Status: AC
  Administered 2019-02-16: 1000 mg via INTRAVENOUS

## 2019-02-16 MED ORDER — LACTATED RINGERS IV SOLN: INTRAVENOUS | Status: DC

## 2019-02-16 MED ORDER — FENTANYL CITRATE (PF) 100 MCG/2ML IJ SOLN
INTRAMUSCULAR | Status: AC
  Filled 2019-02-16: qty 4

## 2019-02-16 MED ORDER — LIDOCAINE HCL 1 % IJ SOLN
INTRAMUSCULAR | Status: AC
  Filled 2019-02-16: qty 20

## 2019-02-16 MED ORDER — MIDAZOLAM HCL 2 MG/2ML IJ SOLN
INTRAMUSCULAR | Status: AC
  Filled 2019-02-16: qty 2

## 2019-02-16 MED ORDER — DIPHENHYDRAMINE HCL 50 MG/ML IJ SOLN
50.0000 mg | Freq: Once | INTRAMUSCULAR | Status: AC
  Administered 2019-02-16: 12:00:00 50 mg via INTRAVENOUS
  Filled 2019-02-16: qty 1

## 2019-02-16 MED ORDER — MIDAZOLAM HCL 2 MG/2ML IJ SOLN
INTRAMUSCULAR | Status: AC
  Filled 2019-02-16: qty 4

## 2019-02-16 MED ORDER — VANCOMYCIN HCL IN DEXTROSE 1-5 GM/200ML-% IV SOLN
INTRAVENOUS | Status: AC
  Filled 2019-02-16: qty 200

## 2019-02-16 MED ORDER — IOHEXOL 300 MG/ML  SOLN
100.0000 mL | Freq: Once | INTRAMUSCULAR | Status: AC | PRN
  Administered 2019-02-16: 100 mL via INTRA_ARTERIAL

## 2019-02-16 MED ORDER — LIDOCAINE HCL (PF) 1 % IJ SOLN
INTRAMUSCULAR | Status: AC | PRN
  Administered 2019-02-16: 10 mL

## 2019-02-16 MED ORDER — FENTANYL CITRATE (PF) 100 MCG/2ML IJ SOLN
INTRAMUSCULAR | Status: AC | PRN
  Administered 2019-02-16 (×4): 50 ug via INTRAVENOUS

## 2019-02-16 MED ORDER — EPINEPHRINE PF 1 MG/ML IJ SOLN
INTRAMUSCULAR | Status: AC
  Filled 2019-02-16: qty 1

## 2019-02-16 MED ORDER — IOHEXOL 300 MG/ML  SOLN
100.0000 mL | Freq: Once | INTRAMUSCULAR | Status: AC | PRN
  Administered 2019-02-16: 11:00:00 10 mL via INTRA_ARTERIAL

## 2019-02-16 NOTE — Procedures (Signed)
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

## 2019-02-16 NOTE — H&P (Signed)
Referring Physician(s): [place]  Supervising Physician: Daryll Brod  Patient Status:  [facility]  Chief Complaint: Left renal angiomyolipoma   Subjective: Patient familiar to IR service from recent tele visit Dr. Annamaria Boots on 01/24/2019 to discuss treatment options for a 5.6 cm left renal angiomyolipoma.  She was deemed an appropriate candidate for catheter directed embolization of the angiomyolipoma and presents today for the procedure.  See previous consult for additional medical history.  She currently denies fever, headache, chest pain, back pain, nausea, vomiting or bleeding.  She does have some chronic dyspnea with exertion, occasional cough, and some occasional right upper quadrant discomfort.  She is anxious.  Past Medical History:  Diagnosis Date  . Adjustment disorder with anxiety   . Asthma   . Atrial fibrillation ([employer])   . Benign essential hypertension   . COPD (chronic obstructive pulmonary disease) ([place])   . Coronary artery disease   . Diabetic neuropathic arthritis ([place])   . Heart palpitations   . Hypoxia 07/19/2018  . Other cirrhosis of liver ([place]) 07/20/2018  . Uncontrolled diabetes mellitus type 2 without complications    Past Surgical History:  Procedure Laterality Date  . CARDIAC SURGERY    . CESAREAN SECTION    . CORONARY ARTERY BYPASS GRAFT  07/06/2017   Redo-sternotomy and CABG 2 with LIMA to LAD, SVG to OM, and a a closure, mitral valve repair with #30 mm [place] ring on 07/06/2017  . ESOPHAGEAL MANOMETRY N/A 03/06/2012   Procedure: ESOPHAGEAL MANOMETRY (EM);  Surgeon: Beryle Beams, MD;  Location: [facility] END[facility]COPY;  Service: Endoscopy;  Laterality: N/A;  . TONSILLECTOMY         Allergies: Contrast media [iodinated diagnostic agents], Adhesive [tape], and Penicillins  Medications: Prior to Admission medications   Medication Sig Start Date End Date Taking? Authorizing Provider  acetaminophen (TYLENOL) 325 MG tablet Take 2 tablets (650 mg total) by  mouth every 6 (six) hours as needed for fever or headache. 10/31/18   Horton, Barbette Hair, MD  albuterol (PROVENTIL) (2.5 MG/3ML) 0.083% nebulizer solution Take 3 mLs (2.5 mg total) by nebulization every 6 (six) hours as needed for wheezing or shortness of breath. 06/12/18   Fenton Foy, NP  albuterol (VENTOLIN HFA) 108 (90 Base) MCG/ACT inhaler Inhale 2 puffs into the lungs every 6 (six) hours as needed for wheezing. 06/12/18   Fenton Foy, NP  apixaban (ELIQUIS) 5 MG TABS tablet Take 1 tablet (5 mg total) by mouth 2 (two) times daily. 09/28/17   Charolette Forward, MD  arformoterol (BROVANA) 15 MCG/2ML NEBU Take 2 mLs (15 mcg total) by nebulization 2 (two) times daily. 01/24/19   [name], DO  azithromycin (ZITHROMAX) 250 MG tablet Take 1 tablet (250 mg total) by mouth every Monday, Wednesday, and Friday. 02/14/19 05/15/19  [name], DO  Blood Glucose Monitoring Suppl (ACCU-CHEK AVIVA PLUS) w/Device KIT USE TO CHECK BLOOD SUGARS ONCE DAILY 10/10/18   [provider]  budesonide (PULMICORT) 0.25 MG/2ML nebulizer solution Take 2 mLs (0.25 mg total) by nebulization 2 (two) times daily. 01/24/19   Icard, Octavio Graves, DO  cetirizine (ZYRTEC) 10 MG tablet Take 1 tablet (10 mg total) by mouth daily. Patient taking differently: Take 10 mg by mouth as needed.  10/26/18   Jaynee Eagles, PA-C  diphenhydrAMINE (BENADRYL) 12.5 MG/5ML elixir Take 12.5 mg by mouth daily as needed.  12/05/18   [provider]  famotidine (PEPCID) 20 MG tablet Take 20 mg by mouth  daily before breakfast.  02/04/15   [provider]  ferrous sulfate 325 (65 FE) MG tablet Take 1 tablet (325 mg total) by mouth 3 (three) times daily with meals. Patient taking differently: Take 325 mg by mouth daily.  09/28/17   Charolette Forward, MD  fluticasone (FLONASE) 50 MCG/ACT nasal spray Place 2 sprays into both nostrils 2 (two) times daily. Patient taking differently: Place 2 sprays into both nostrils as needed.   05/04/18   Lamptey, Myrene Galas, MD  furosemide (LASIX) 20 MG tablet Take 1 tablet (20 mg total) by mouth 2 (two) times daily. 07/21/18   Samuella Cota, MD  glucose blood (ACCU-CHEK AVIVA PLUS) test strip USE WITH GLUCOMETER TO CHECK BLOOD SUGARS ONCE DAILY 10/10/18   [provider]  ipratropium-albuterol (DUONEB) 0.5-2.5 (3) MG/3ML SOLN Take 3 mLs by nebulization every 4 (four) hours as needed. Patient not taking: Reported on 02/14/2019 01/24/19   June Leap L, DO  isosorbide mononitrate (IMDUR) 30 MG 24 hr tablet Take 1 tablet by mouth as needed.  07/12/18   [provider]  magic mouthwash w/lidocaine SOLN Take 5 mLs by mouth 3 (three) times daily as needed for mouth pain. 12/05/18   Wieters, Hallie C, PA-C  metFORMIN (GLUCOPHAGE XR) 500 MG 24 hr tablet Take 1 tablet (500 mg total) by mouth daily with breakfast. Patient taking differently: Take 500 mg by mouth 2 (two) times daily.  06/02/16   [place] Circle, FNP  montelukast (SINGULAIR) 10 MG tablet Take 1 tablet (10 mg total) by mouth at bedtime. Patient taking differently: Take 10 mg by mouth as needed.  06/12/18   Fenton Foy, NP  Multiple Vitamin (MULTIVITAMIN WITH MINERALS) TABS tablet Take 1 tablet by mouth daily. Patient taking differently: Take 1 tablet by mouth. occas 09/28/17   Charolette Forward, MD  nitroGLYCERIN (NITROSTAT) 0.4 MG SL tablet Place 0.4 mg under the tongue every 5 (five) minutes as needed for chest pain.     [provider]  pantoprazole (PROTONIX) 40 MG tablet Take 1 tablet daily before first meal 01/08/19   Tanda Rockers, MD     Vital Signs: Blood pressure 185/88, heart rate 72, temp 98.2, respirations 16, O2 sat 99% room air   Physical Exam awake, alert.  Chest with distant BS bilat and sl more dim left base.  Heart with normal rate, irregular rhythm.  Abdomen soft, positive bowel sounds, mild right upper quadrant tenderness to palpation.  No significant lower extremity edema, intact  distal pulses.  Imaging: No results found.  Labs:  CBC: Recent Labs    07/20/18 0524 08/12/18 0526 10/30/18 1911 12/10/18 0829  WBC 6.2 6.1 8.2 6.6  HGB 10.5* 11.3* 10.4* 11.4*  HCT 33.4* 35.7* 33.5* 36.0  PLT 182 112* 230 167    COAGS: No results for input(s): INR, APTT in the last 8760 hours.  BMP: Recent Labs    07/20/18 0524 08/12/18 0526 10/30/18 1911 12/10/18 0829  NA 137 138 132* 135  K 3.8 4.9 5.3* 4.2  CL 102 107 101 102  CO2 24 24 20* 23  GLUCOSE 101* 207* 132* 136*  BUN 25* _0 CALCIUM 8.6* 9.0 8.6* 8.6*  CREATININE 0.98 0.86 0.79 0.81  GFRNONAA 58* >60 >60 >60  GFRAA >60 >60 >60 >60    LIVER FUNCTION TESTS: Recent Labs    07/19/18 1256 12/10/18 0829  BILITOT 1.0 1.1  AST 30 38  ALT 26 41  ALKPHOS 62  60  PROT 6.8 7.5  ALBUMIN 3.4* 3.2*    Assessment and Plan: Patient with history of an enlarging 5.6 cm left renal angiomyolipoma with evidence of internal hemorrhage since 2013.  Patient underwent tele consult with Dr. Annamaria Boots on 01/24/2019 to discuss treatment options. She was deemed an appropriate candidate for catheter directed embolization of the angiomyolipoma and presents today for the procedure.Risks and benefits of procedure were discussed with the patient including, but not limited to bleeding, infection, vascular injury or contrast induced renal failure.  This interventional procedure involves the use of X-rays and because of the nature of the planned procedure, it is possible that we will have prolonged use of X-ray fluoroscopy.  Potential radiation risks to you include (but are not limited to) the following: - A slightly elevated risk for cancer  several years later in life. This risk is typically less than 0.5% percent. This risk is low in comparison to the normal incidence of human cancer, which is 33% for women and 50% for men according to the [place]. - Radiation induced injury can include skin redness,  resembling a rash, tissue breakdown / ulcers and hair loss (which can be temporary or permanent).   The likelihood of either of these occurring depends on the difficulty of the procedure and whether you are sensitive to radiation due to previous procedures, disease, or genetic conditions.   IF your procedure requires a prolonged use of radiation, you will be notified and given written instructions for further action.  It is your responsibility to monitor the irradiated area for the 2 weeks following the procedure and to notify your physician if you are concerned that you have suffered a radiation induced injury.    All of the patient's questions were answered, patient is agreeable to proceed.  Consent signed and in chart.  Patient also noted to have contrast media allergy and has been premedicated with 13-hour prednisone/Benadryl prep.  LABS pending  Electronically Signed: D. Rowe Robert, PA-C 02/16/2019, 8:50 AM   I spent a total of 30 minutes at the the patient's bedside AND on the patient's hospital floor or unit, greater than 50% of which was counseling/coordinating care for left renal arteriogram with embolization of left renal angiomyolipoma

## 2019-02-16 NOTE — Progress Notes (Signed)
Pt. Returned from IR at 62- RN provided report. Pt. Scratching on chest and head.  No rash noted.  Pt. A/O denies pain. 1130 cool cloth to areas where pt. Was scratching no rash noted.  1145 contacted Dr. Annamaria Boots- informed of pt. Continues to scratch at upper chest; shoulders and head.  Slight reddened area- no whelps or large rash noted.   BP slightly elevated. Orders for IV Benedryl.

## 2019-02-16 NOTE — Discharge Instructions (Addendum)
Embolization, Care After This sheet gives you information about how to care for yourself after your procedure. Your health care provider may also give you more specific instructions. If you have problems or questions, contact your health care provider. What can I expect after the procedure? After the procedure, it is common to have pain or bruising at the area where your catheter was inserted (insertion site).   Follow these instructions at home: Insertion site care   If the site starts to bleed, lie down flat and put pressure on the site. If the bleeding does not stop, get help right away. This is a medical emergency.  Follow instructions from your health care provider about how to take care of your insertion site. Make sure you: ? Wash your hands with soap and water before and after you change your bandage (dressing). If soap and water are not available, use hand sanitizer. ? Change your dressing as told by your health care provider. ? Leave stitches (sutures), skin glue, or adhesive strips in place. These skin closures may need to stay in place for 2 weeks or longer. If adhesive strip edges start to loosen and curl up, you may trim the loose edges. Do not remove adhesive strips completely unless your health care provider tells you to do that.  Keep the insertion site clean. To do this: ? Gently wash the site with plain soap and water. ? Pat the area dry with a clean towel. ? Do not rub the site. This may cause bleeding.  Check your insertion site every day for signs of infection. Check for: ? Redness, swelling, or pain. ? Fluid or blood. ? Warmth. ? Pus or a bad smell.  Do not take baths, swim, or use a hot tub until your health care provider approves. You may be allowed to shower 24-48 hours after the procedure. Activity   Do not lift anything that is heavier than 10 lb (4.5 kg), or the limit that you are told, until your health care provider says that it is safe.  Do not drive  for 24 hours if you were given a sedative during your procedure.  Return to your normal activities as told by your health care provider. Ask your health care provider what activities are safe for you. General instructions  Take over-the-counter and prescription medicines only as told by your health care provider.  Drink enough fluid to keep your urine pale yellow.  Take over-the-counter or prescription medicines.  Keep all follow-up visits as told by your health care provider. This is important. Contact a health care provider if:  You have pain that gets worse or does not get better with medicine.  You have a fever.  You have redness, swelling, or pain around your insertion site.  You have fluid or blood coming from your insertion site.  Your insertion site is warm to the touch.  You have pus or a bad smell coming from your insertion site.  You have nausea or vomiting. Get help right away if:  The insertion area swells very quickly.  The insertion area is bleeding, and the bleeding does not stop after you hold steady pressure on the area.  The area near or just beyond the insertion site becomes pale, cool, tingly, or numb.  You faint or feel like you might faint.  You have chest pain.  You have trouble breathing.  You feel weak or have trouble moving your arms or legs.  You have problems with balance, speech,  or vision. These symptoms may represent a serious problem that is an emergency. Do not wait to see if the symptoms will go away. Get medical help right away. Call your local emergency services (911 in the [place] Do not drive yourself to the hospital. Summary  After your procedure, it is common to have pain or bruising at the area where your catheter was inserted (insertion site).  Do not drive for 24 hours if you were given a sedative during your procedure.  Follow instructions from your health care provider about how to take care of your insertion  site.  Contact a health care provider if you have a fever or if you have redness, swelling, or pain around your insertion site. Keep all follow-up visits as told by your health care provider. This is important.    This information is not intended to replace advice given to you by your health care provider. Make sure you discuss any questions you have with your health care provider. Document Revised: 05/17/2018 Document Reviewed: 08/29/2017 Elsevier Patient Education  [place].   Moderate Conscious Sedation, Adult, Care After These instructions provide you with information about caring for yourself after your procedure. Your health care provider may also give you more specific instructions. Your treatment has been planned according to current medical practices, but problems sometimes occur. Call your health care provider if you have any problems or questions after your procedure. What can I expect after the procedure? After your procedure, it is common:  To feel sleepy for several hours.  To feel clumsy and have poor balance for several hours.  To have poor judgment for several hours.  To vomit if you eat too soon. Follow these instructions at home: For at least 24 hours after the procedure:   Do not: ? Participate in activities where you could fall or become injured. ? Drive. ? Use heavy machinery. ? Drink alcohol. ? Take sleeping pills or medicines that cause drowsiness. ? Make important decisions or sign legal documents. ? Take care of children on your own.  Rest. Eating and drinking  Follow the diet recommended by your health care provider.  If you vomit: ? Drink water, juice, or soup when you can drink without vomiting. ? Make sure you have little or no nausea before eating solid foods. General instructions  Have a responsible adult stay with you until you are awake and alert.  Take over-the-counter and prescription medicines only as told by your health  care provider.  If you smoke, do not smoke without supervision.  Keep all follow-up visits as told by your health care provider. This is important. Contact a health care provider if:  You keep feeling nauseous or you keep vomiting.  You feel light-headed.  You develop a rash.  You have a fever. Get help right away if:  You have trouble breathing. This information is not intended to replace advice given to you by your health care provider. Make sure you discuss any questions you have with your health care provider. Document Revised: 12/10/2016 Document Reviewed: 04/19/2015 Elsevier Patient Education  2020 [employer].

## 2019-02-20 ENCOUNTER — Telehealth (HOSPITAL_COMMUNITY): Payer: Self-pay | Admitting: Interventional Radiology

## 2019-02-20 NOTE — Progress Notes (Signed)
Telephone calls to the Pt and her daughter(esther hernandez) today  reexplained the renal angio and findings to the patientand her daughter today.  And that no embolization was needed at the time of angio because no abnormal vascularity could be identified to the 5 cm left renal AML.  She seems to have a better understanding.  I think the left renal AML vessels may have thrombosed as a result of the recent intralesion hemorrhage demonstrated on the recent CT.  rec followup CT in 6 months

## 2019-02-26 ENCOUNTER — Other Ambulatory Visit (HOSPITAL_COMMUNITY): Payer: Self-pay | Admitting: Interventional Radiology

## 2019-02-28 ENCOUNTER — Other Ambulatory Visit: Payer: Self-pay | Admitting: Interventional Radiology

## 2019-02-28 DIAGNOSIS — D1771 Benign lipomatous neoplasm of kidney: Secondary | ICD-10-CM

## 2019-03-06 NOTE — Progress Notes (Signed)
_0  ID: Sharon Spears, female    DOB: July 31, 1945, 74 y.o.   MRN: [id number]  Chief Complaint  Patient presents with  . Follow-up    3wk f/u for asthma. States her breathing has gotten worse since last visit. Non-productive cough. Increased chest tightness.     Referring provider: Sonia Side., FNP  HPI:  74 year old female never smoker but with prolonged smoke exposure living in [place] for over 50 years referred to our office on 01/26/2018 for prolonged dyspnea.  Also managed for asthma.  PMH: Congestive heart failure Smoker/ Smoking History: Never smoker Maintenance: Brovana, Pulmicort, Azithromycin MWF Pt of: Dr. Valeta Harms  03/07/2019  - Visit   74 year old female never smoker followed in our office for asthma.  Patient continues to have recurrent asthma exacerbations as well as dyspnea on exertion.  Throughout this work-up she has had a high-resolution CT chest which revealed a renal mass which she is followed by Dr. Lovena Neighbours at [facility] urology.  Next follow-up scheduled with them is in April/2021.  Is also revealed a thyroid mass.  She has been established with [facility] Dr. Scherrie Gerlach.  Unfortunately patient no longer has a primary care provider.  We have gotten the patient scheduled as a new patient to establish care with Dr. Einar Pheasant on 04/05/2019 11 AM.  Patient is currently scheduled to receive the Covid vaccine tomorrow for her first dose.  Patient reporting today she is had some increased sore throat.  She notices that the symptoms worsen when she use her Pulmicort nebs.  She is only been using half of the dosing of her Pulmicort nebs.  She remains adherent to Brovana nebulized meds twice daily.  She continues to be adherent to azithromycin Monday Wednesday Friday.  [facility] try to reach the patient yesterday regarding the results of her thyroid work-up.  They were unsuccessful in reaching her.  I personally contacted their office to get her scheduled for a  close follow-up.  This is been scheduled on 03/16/2019 at 3:20 PM.  There continues to be confusion regarding the patient's work-up with her specialist.  I believe this is likely due to the fact that the patient is typically going to these follow-ups on her own.     Questionaires / Pulmonary Flowsheets:   ACT:  Asthma Control Test ACT Total Score  01/24/2019 11    Tests:   02/08/2018-pulmonary function test - FVC 1.46 (58% predicted), postbronchodilator ratio 82, postbronchodilator FEV1 1.19 (63% predicted), no significant bronchodilator response, slight mid flow reversibility after administration of bronchodilators, DLCO is 67 which corrects to 79 based off of hemoglobin.  06/12/2018-IgE-49 06/12/2018-CBC with differential-eosinophils relative 0.6, eosinophils absolute 0  11/23/2018-CT chest high-res-there are areas of parenchymal banding throughout the mid to lower lungs bilaterally which favored to reflect chronic postinfectious or inflammatory scarring, no imaging findings to clearly indicate interstitial lung disease, small left pleural effusion, hemorrhagic lesion in the upper pole of the left kidney, presumably a Angio myolipoma based on comparison with remote prior CT the abdomen pelvis from 07/16/2011.  Consultation with urology for further work-up and management is recommended.  Dilatation of the pulmonic trunk which may suggest pulmonary arterial hypertension, 2 mm nonobstructive calculus in the upper pole collecting system of left kidney, aortic arthrosclerosis, 3.2 x 2.9 cm intermediate attenuation lesion in the left lobe of the thyroid gland is indeterminate, further evaluation with nonemergent thyroid ultrasound is recommended in the near future to better evaluate this lesion and determine potential need  for fine-needle aspiration  07/20/2018-echocardiogram-LV has mildly reduced systolic function, EF of 45 to 50%, cavity size normal, right ventricle has normal systolic function, no increase  in right ventricular wall thickness, left atrial size was mildly dilated, right atrial size was mildly dilated, moderate thickening of mitral valve leaflet, moderate calcification of the mitral valve leaflet  FENO:  No results found for: NITRICOXIDE  PFT: PFT Results Latest Ref Rng & Units 02/08/2018  FVC-Pre L 1.46  FVC-Predicted Pre % 58  FVC-Post L 1.46  FVC-Predicted Post % 58  Pre FEV1/FVC % % 80  Post FEV1/FCV % % 82  FEV1-Pre L 1.17  FEV1-Predicted Pre % 62  FEV1-Post L 1.19  DLCO UNC% % 67  DLCO COR %Predicted % 112  TLC L 3.41  TLC % Predicted % 76  RV % Predicted % 92    WALK:  No flowsheet data found.  Imaging: DG Chest 1 View  Result Date: 02/16/2019 CLINICAL DATA:  Preoperative evaluation for left renal artery embolization, diabetes, hypertension EXAM: CHEST  1 VIEW COMPARISON:  12/10/2018 FINDINGS: Single frontal view of the chest demonstrates stable postsurgical changes from median sternotomy. The cardiac silhouette is enlarged but stable. Chronic small left pleural effusion versus pleural thickening. Bibasilar areas of scarring. Chronic central vascular congestion. No acute airspace disease or pneumothorax. IMPRESSION: 1. Chronic small left pleural effusion versus pleural thickening. 2. Chronic central vascular congestion.  No acute airspace disease. Electronically Signed   By: Randa Ngo M.D.   On: 02/16/2019 09:21   IR Angiogram Renal Left Selective  Result Date: 02/16/2019 INDICATION: 5 cm left renal angiomyolipoma with evidence of intra lesion subacute hemorrhage by CT 01/04/2019. EXAM: Ultrasound guidance for vascular access Left renal artery catheterization and angiogram Four additional after basic peripheral renal segmental artery micro catheterizations and angiograms MEDICATIONS: Vancomycin 1 g. The antibiotic was administered within 1 hour of the procedure ANESTHESIA/SEDATION: Moderate (conscious) sedation was employed during this procedure. A total of  Versed 6.0 mg and Fentanyl 200 mcg was administered intravenously. Moderate Sedation Time: 56 minutes. The patient's level of consciousness and vital signs were monitored continuously by radiology nursing throughout the procedure under my direct supervision. CONTRAST:  100 cc Omnipaque 300 FLUOROSCOPY TIME:  Fluoroscopy Time: 8 minutes 18 seconds (1216 mGy). COMPLICATIONS: None immediate. PROCEDURE: Informed consent was obtained from the patient following explanation of the procedure, risks, benefits and alternatives. The patient understands, agrees and consents for the procedure. All questions were addressed. A time out was performed prior to the initiation of the procedure. Maximal barrier sterile technique utilized including caps, mask, sterile gowns, sterile gloves, large sterile drape, hand hygiene, and Betadine prep. Under sterile conditions and local anesthesia, ultrasound micropuncture access performed of the patent right common femoral artery. Ultrasound images obtained for documentation of the patent right common femoral artery access. Five French sheath inserted over a guidewire. Five French C2 catheter utilized to select the left renal artery. Selective initial main left renal artery angiogram performed. Left main renal artery: Mild ostial stenosis estimated at less than 50%. Minor wall irregularity related to atherosclerotic change. Main left renal artery is patent. Superior and inferior divisions are widely patent. Segmental and peripheral vasculature are all patent. With delayed imaging the nephrogram demonstrates a central hilar angiographic defect which correlates with the location of the 5 cm angiomyolipoma by CTA. No suspicious vascularity upon initial angiogram. C2 catheter advanced over the Bentson guidewire into the superior division of the left renal artery. Selective left renal  angiogram performed. Superior division: Widely patent superior division, segmental, and peripheral branching  vascularity. No suspicious tumor vascularity appreciated. Renegade high flow microcatheter advanced into the superior division within an inferior segmental branch as well as a superior segmental branch. Additional after basic peripheral renal angiograms performed from these 2 locations. Again, no suspicious tortuous tumor vascularity demonstrated that warrants embolization. Inferior division: Renegade high flow catheter retracted and advanced into main inferior division. 2 additional after basic branches of the inferior division were performed. Magnification angiograms performed. Again, no tumor vascularity appreciated. Catheter was retracted into the main inferior division. Additional inferior division angiogram again confirms no definite suspicious tumor vascularity. Therefore, embolization was not performed or deemed necessary. Catheter and microcatheter removed. Hemostasis obtained with the ExoSeal device. No immediate complication. Patient tolerated the procedure well. IMPRESSION: Successful left renal angiogram with interrogation of several additional after basic segmental branches from the superior and inferior divisions. Despite this arterial vascular search, no significant abnormal tumor vascularity was identified that warrants embolization. Electronically Signed   By: Jerilynn Mages.  Shick M.D.   On: 02/16/2019 11:48   IR Angiogram Selective Each Additional Vessel  Result Date: 02/16/2019 INDICATION: 5 cm left renal angiomyolipoma with evidence of intra lesion subacute hemorrhage by CT 01/04/2019. EXAM: Ultrasound guidance for vascular access Left renal artery catheterization and angiogram Four additional after basic peripheral renal segmental artery micro catheterizations and angiograms MEDICATIONS: Vancomycin 1 g. The antibiotic was administered within 1 hour of the procedure ANESTHESIA/SEDATION: Moderate (conscious) sedation was employed during this procedure. A total of Versed 6.0 mg and Fentanyl 200 mcg was  administered intravenously. Moderate Sedation Time: 56 minutes. The patient's level of consciousness and vital signs were monitored continuously by radiology nursing throughout the procedure under my direct supervision. CONTRAST:  100 cc Omnipaque 300 FLUOROSCOPY TIME:  Fluoroscopy Time: 8 minutes 18 seconds (1216 mGy). COMPLICATIONS: None immediate. PROCEDURE: Informed consent was obtained from the patient following explanation of the procedure, risks, benefits and alternatives. The patient understands, agrees and consents for the procedure. All questions were addressed. A time out was performed prior to the initiation of the procedure. Maximal barrier sterile technique utilized including caps, mask, sterile gowns, sterile gloves, large sterile drape, hand hygiene, and Betadine prep. Under sterile conditions and local anesthesia, ultrasound micropuncture access performed of the patent right common femoral artery. Ultrasound images obtained for documentation of the patent right common femoral artery access. Five French sheath inserted over a guidewire. Five French C2 catheter utilized to select the left renal artery. Selective initial main left renal artery angiogram performed. Left main renal artery: Mild ostial stenosis estimated at less than 50%. Minor wall irregularity related to atherosclerotic change. Main left renal artery is patent. Superior and inferior divisions are widely patent. Segmental and peripheral vasculature are all patent. With delayed imaging the nephrogram demonstrates a central hilar angiographic defect which correlates with the location of the 5 cm angiomyolipoma by CTA. No suspicious vascularity upon initial angiogram. C2 catheter advanced over the Bentson guidewire into the superior division of the left renal artery. Selective left renal angiogram performed. Superior division: Widely patent superior division, segmental, and peripheral branching vascularity. No suspicious tumor vascularity  appreciated. Renegade high flow microcatheter advanced into the superior division within an inferior segmental branch as well as a superior segmental branch. Additional after basic peripheral renal angiograms performed from these 2 locations. Again, no suspicious tortuous tumor vascularity demonstrated that warrants embolization. Inferior division: Renegade high flow catheter retracted and advanced into main inferior  division. 2 additional after basic branches of the inferior division were performed. Magnification angiograms performed. Again, no tumor vascularity appreciated. Catheter was retracted into the main inferior division. Additional inferior division angiogram again confirms no definite suspicious tumor vascularity. Therefore, embolization was not performed or deemed necessary. Catheter and microcatheter removed. Hemostasis obtained with the ExoSeal device. No immediate complication. Patient tolerated the procedure well. IMPRESSION: Successful left renal angiogram with interrogation of several additional after basic segmental branches from the superior and inferior divisions. Despite this arterial vascular search, no significant abnormal tumor vascularity was identified that warrants embolization. Electronically Signed   By: Jerilynn Mages.  Shick M.D.   On: 02/16/2019 11:48   IR Angiogram Selective Each Additional Vessel  Result Date: 02/16/2019 INDICATION: 5 cm left renal angiomyolipoma with evidence of intra lesion subacute hemorrhage by CT 01/04/2019. EXAM: Ultrasound guidance for vascular access Left renal artery catheterization and angiogram Four additional after basic peripheral renal segmental artery micro catheterizations and angiograms MEDICATIONS: Vancomycin 1 g. The antibiotic was administered within 1 hour of the procedure ANESTHESIA/SEDATION: Moderate (conscious) sedation was employed during this procedure. A total of Versed 6.0 mg and Fentanyl 200 mcg was administered intravenously. Moderate Sedation  Time: 56 minutes. The patient's level of consciousness and vital signs were monitored continuously by radiology nursing throughout the procedure under my direct supervision. CONTRAST:  100 cc Omnipaque 300 FLUOROSCOPY TIME:  Fluoroscopy Time: 8 minutes 18 seconds (1216 mGy). COMPLICATIONS: None immediate. PROCEDURE: Informed consent was obtained from the patient following explanation of the procedure, risks, benefits and alternatives. The patient understands, agrees and consents for the procedure. All questions were addressed. A time out was performed prior to the initiation of the procedure. Maximal barrier sterile technique utilized including caps, mask, sterile gowns, sterile gloves, large sterile drape, hand hygiene, and Betadine prep. Under sterile conditions and local anesthesia, ultrasound micropuncture access performed of the patent right common femoral artery. Ultrasound images obtained for documentation of the patent right common femoral artery access. Five French sheath inserted over a guidewire. Five French C2 catheter utilized to select the left renal artery. Selective initial main left renal artery angiogram performed. Left main renal artery: Mild ostial stenosis estimated at less than 50%. Minor wall irregularity related to atherosclerotic change. Main left renal artery is patent. Superior and inferior divisions are widely patent. Segmental and peripheral vasculature are all patent. With delayed imaging the nephrogram demonstrates a central hilar angiographic defect which correlates with the location of the 5 cm angiomyolipoma by CTA. No suspicious vascularity upon initial angiogram. C2 catheter advanced over the Bentson guidewire into the superior division of the left renal artery. Selective left renal angiogram performed. Superior division: Widely patent superior division, segmental, and peripheral branching vascularity. No suspicious tumor vascularity appreciated. Renegade high flow microcatheter  advanced into the superior division within an inferior segmental branch as well as a superior segmental branch. Additional after basic peripheral renal angiograms performed from these 2 locations. Again, no suspicious tortuous tumor vascularity demonstrated that warrants embolization. Inferior division: Renegade high flow catheter retracted and advanced into main inferior division. 2 additional after basic branches of the inferior division were performed. Magnification angiograms performed. Again, no tumor vascularity appreciated. Catheter was retracted into the main inferior division. Additional inferior division angiogram again confirms no definite suspicious tumor vascularity. Therefore, embolization was not performed or deemed necessary. Catheter and microcatheter removed. Hemostasis obtained with the ExoSeal device. No immediate complication. Patient tolerated the procedure well. IMPRESSION: Successful left renal angiogram with interrogation of  several additional after basic segmental branches from the superior and inferior divisions. Despite this arterial vascular search, no significant abnormal tumor vascularity was identified that warrants embolization. Electronically Signed   By: Jerilynn Mages.  Shick M.D.   On: 02/16/2019 11:48   IR Angiogram Selective Each Additional Vessel  Result Date: 02/16/2019 INDICATION: 5 cm left renal angiomyolipoma with evidence of intra lesion subacute hemorrhage by CT 01/04/2019. EXAM: Ultrasound guidance for vascular access Left renal artery catheterization and angiogram Four additional after basic peripheral renal segmental artery micro catheterizations and angiograms MEDICATIONS: Vancomycin 1 g. The antibiotic was administered within 1 hour of the procedure ANESTHESIA/SEDATION: Moderate (conscious) sedation was employed during this procedure. A total of Versed 6.0 mg and Fentanyl 200 mcg was administered intravenously. Moderate Sedation Time: 56 minutes. The patient's level of  consciousness and vital signs were monitored continuously by radiology nursing throughout the procedure under my direct supervision. CONTRAST:  100 cc Omnipaque 300 FLUOROSCOPY TIME:  Fluoroscopy Time: 8 minutes 18 seconds (1216 mGy). COMPLICATIONS: None immediate. PROCEDURE: Informed consent was obtained from the patient following explanation of the procedure, risks, benefits and alternatives. The patient understands, agrees and consents for the procedure. All questions were addressed. A time out was performed prior to the initiation of the procedure. Maximal barrier sterile technique utilized including caps, mask, sterile gowns, sterile gloves, large sterile drape, hand hygiene, and Betadine prep. Under sterile conditions and local anesthesia, ultrasound micropuncture access performed of the patent right common femoral artery. Ultrasound images obtained for documentation of the patent right common femoral artery access. Five French sheath inserted over a guidewire. Five French C2 catheter utilized to select the left renal artery. Selective initial main left renal artery angiogram performed. Left main renal artery: Mild ostial stenosis estimated at less than 50%. Minor wall irregularity related to atherosclerotic change. Main left renal artery is patent. Superior and inferior divisions are widely patent. Segmental and peripheral vasculature are all patent. With delayed imaging the nephrogram demonstrates a central hilar angiographic defect which correlates with the location of the 5 cm angiomyolipoma by CTA. No suspicious vascularity upon initial angiogram. C2 catheter advanced over the Bentson guidewire into the superior division of the left renal artery. Selective left renal angiogram performed. Superior division: Widely patent superior division, segmental, and peripheral branching vascularity. No suspicious tumor vascularity appreciated. Renegade high flow microcatheter advanced into the superior division within  an inferior segmental branch as well as a superior segmental branch. Additional after basic peripheral renal angiograms performed from these 2 locations. Again, no suspicious tortuous tumor vascularity demonstrated that warrants embolization. Inferior division: Renegade high flow catheter retracted and advanced into main inferior division. 2 additional after basic branches of the inferior division were performed. Magnification angiograms performed. Again, no tumor vascularity appreciated. Catheter was retracted into the main inferior division. Additional inferior division angiogram again confirms no definite suspicious tumor vascularity. Therefore, embolization was not performed or deemed necessary. Catheter and microcatheter removed. Hemostasis obtained with the ExoSeal device. No immediate complication. Patient tolerated the procedure well. IMPRESSION: Successful left renal angiogram with interrogation of several additional after basic segmental branches from the superior and inferior divisions. Despite this arterial vascular search, no significant abnormal tumor vascularity was identified that warrants embolization. Electronically Signed   By: Jerilynn Mages.  Shick M.D.   On: 02/16/2019 11:48   IR Angiogram Selective Each Additional Vessel  Result Date: 02/16/2019 INDICATION: 5 cm left renal angiomyolipoma with evidence of intra lesion subacute hemorrhage by CT 01/04/2019. EXAM: Ultrasound guidance for  vascular access Left renal artery catheterization and angiogram Four additional after basic peripheral renal segmental artery micro catheterizations and angiograms MEDICATIONS: Vancomycin 1 g. The antibiotic was administered within 1 hour of the procedure ANESTHESIA/SEDATION: Moderate (conscious) sedation was employed during this procedure. A total of Versed 6.0 mg and Fentanyl 200 mcg was administered intravenously. Moderate Sedation Time: 56 minutes. The patient's level of consciousness and vital signs were monitored  continuously by radiology nursing throughout the procedure under my direct supervision. CONTRAST:  100 cc Omnipaque 300 FLUOROSCOPY TIME:  Fluoroscopy Time: 8 minutes 18 seconds (1216 mGy). COMPLICATIONS: None immediate. PROCEDURE: Informed consent was obtained from the patient following explanation of the procedure, risks, benefits and alternatives. The patient understands, agrees and consents for the procedure. All questions were addressed. A time out was performed prior to the initiation of the procedure. Maximal barrier sterile technique utilized including caps, mask, sterile gowns, sterile gloves, large sterile drape, hand hygiene, and Betadine prep. Under sterile conditions and local anesthesia, ultrasound micropuncture access performed of the patent right common femoral artery. Ultrasound images obtained for documentation of the patent right common femoral artery access. Five French sheath inserted over a guidewire. Five French C2 catheter utilized to select the left renal artery. Selective initial main left renal artery angiogram performed. Left main renal artery: Mild ostial stenosis estimated at less than 50%. Minor wall irregularity related to atherosclerotic change. Main left renal artery is patent. Superior and inferior divisions are widely patent. Segmental and peripheral vasculature are all patent. With delayed imaging the nephrogram demonstrates a central hilar angiographic defect which correlates with the location of the 5 cm angiomyolipoma by CTA. No suspicious vascularity upon initial angiogram. C2 catheter advanced over the Bentson guidewire into the superior division of the left renal artery. Selective left renal angiogram performed. Superior division: Widely patent superior division, segmental, and peripheral branching vascularity. No suspicious tumor vascularity appreciated. Renegade high flow microcatheter advanced into the superior division within an inferior segmental branch as well as a  superior segmental branch. Additional after basic peripheral renal angiograms performed from these 2 locations. Again, no suspicious tortuous tumor vascularity demonstrated that warrants embolization. Inferior division: Renegade high flow catheter retracted and advanced into main inferior division. 2 additional after basic branches of the inferior division were performed. Magnification angiograms performed. Again, no tumor vascularity appreciated. Catheter was retracted into the main inferior division. Additional inferior division angiogram again confirms no definite suspicious tumor vascularity. Therefore, embolization was not performed or deemed necessary. Catheter and microcatheter removed. Hemostasis obtained with the ExoSeal device. No immediate complication. Patient tolerated the procedure well. IMPRESSION: Successful left renal angiogram with interrogation of several additional after basic segmental branches from the superior and inferior divisions. Despite this arterial vascular search, no significant abnormal tumor vascularity was identified that warrants embolization. Electronically Signed   By: Jerilynn Mages.  Shick M.D.   On: 02/16/2019 11:48   IR US Guide Vasc Access Right  Result Date: 02/16/2019 INDICATION: 5 cm left renal angiomyolipoma with evidence of intra lesion subacute hemorrhage by CT 01/04/2019. EXAM: Ultrasound guidance for vascular access Left renal artery catheterization and angiogram Four additional after basic peripheral renal segmental artery micro catheterizations and angiograms MEDICATIONS: Vancomycin 1 g. The antibiotic was administered within 1 hour of the procedure ANESTHESIA/SEDATION: Moderate (conscious) sedation was employed during this procedure. A total of Versed 6.0 mg and Fentanyl 200 mcg was administered intravenously. Moderate Sedation Time: 56 minutes. The patient's level of consciousness and vital signs were monitored continuously by  radiology nursing throughout the procedure  under my direct supervision. CONTRAST:  100 cc Omnipaque 300 FLUOROSCOPY TIME:  Fluoroscopy Time: 8 minutes 18 seconds (1216 mGy). COMPLICATIONS: None immediate. PROCEDURE: Informed consent was obtained from the patient following explanation of the procedure, risks, benefits and alternatives. The patient understands, agrees and consents for the procedure. All questions were addressed. A time out was performed prior to the initiation of the procedure. Maximal barrier sterile technique utilized including caps, mask, sterile gowns, sterile gloves, large sterile drape, hand hygiene, and Betadine prep. Under sterile conditions and local anesthesia, ultrasound micropuncture access performed of the patent right common femoral artery. Ultrasound images obtained for documentation of the patent right common femoral artery access. Five French sheath inserted over a guidewire. Five French C2 catheter utilized to select the left renal artery. Selective initial main left renal artery angiogram performed. Left main renal artery: Mild ostial stenosis estimated at less than 50%. Minor wall irregularity related to atherosclerotic change. Main left renal artery is patent. Superior and inferior divisions are widely patent. Segmental and peripheral vasculature are all patent. With delayed imaging the nephrogram demonstrates a central hilar angiographic defect which correlates with the location of the 5 cm angiomyolipoma by CTA. No suspicious vascularity upon initial angiogram. C2 catheter advanced over the Bentson guidewire into the superior division of the left renal artery. Selective left renal angiogram performed. Superior division: Widely patent superior division, segmental, and peripheral branching vascularity. No suspicious tumor vascularity appreciated. Renegade high flow microcatheter advanced into the superior division within an inferior segmental branch as well as a superior segmental branch. Additional after basic peripheral  renal angiograms performed from these 2 locations. Again, no suspicious tortuous tumor vascularity demonstrated that warrants embolization. Inferior division: Renegade high flow catheter retracted and advanced into main inferior division. 2 additional after basic branches of the inferior division were performed. Magnification angiograms performed. Again, no tumor vascularity appreciated. Catheter was retracted into the main inferior division. Additional inferior division angiogram again confirms no definite suspicious tumor vascularity. Therefore, embolization was not performed or deemed necessary. Catheter and microcatheter removed. Hemostasis obtained with the ExoSeal device. No immediate complication. Patient tolerated the procedure well. IMPRESSION: Successful left renal angiogram with interrogation of several additional after basic segmental branches from the superior and inferior divisions. Despite this arterial vascular search, no significant abnormal tumor vascularity was identified that warrants embolization. Electronically Signed   By: Jerilynn Mages.  Shick M.D.   On: 02/16/2019 11:48    Lab Results:  CBC    Component Value Date/Time   WBC 4.1 02/16/2019 0819   RBC 4.98 02/16/2019 0819   HGB 11.9 (L) 02/16/2019 0819   HCT 38.5 02/16/2019 0819   PLT 147 (L) 02/16/2019 0819   MCV 77.3 (L) 02/16/2019 0819   MCH 23.9 (L) 02/16/2019 0819   MCHC 30.9 02/16/2019 0819   RDW 16.5 (H) 02/16/2019 0819   LYMPHSABS 0.5 (L) 02/16/2019 0819   MONOABS 0.1 02/16/2019 0819   EOSABS 0.0 02/16/2019 0819   BASOSABS 0.0 02/16/2019 0819    BMET    Component Value Date/Time   NA 134 (L) 02/16/2019 0819   K 4.1 02/16/2019 0819   CL 103 02/16/2019 0819   CO2 20 (L) 02/16/2019 0819   GLUCOSE 241 (H) 02/16/2019 0819   BUN 24 (H) 02/16/2019 0819   CREATININE 0.92 02/16/2019 0819   CALCIUM 9.5 02/16/2019 0819   GFRNONAA >60 02/16/2019 0819   GFRAA >60 02/16/2019 0819    BNP  Component Value Date/Time    BNP 580.5 (H) 07/19/2018 0958    ProBNP No results found for: PROBNP  Specialty Problems      Pulmonary Problems   Cough   Shortness of breath   Asthma   Acute respiratory failure with hypoxemia (HCC)   COPD exacerbation (HCC)   Severe persistent asthma      Allergies  Allergen Reactions  . Contrast Media [Iodinated Diagnostic Agents] Anaphylaxis    Per pt she had anaphylaxis reaction to contrast media in the past. States she couldn't breathe and they had to give her epi.   . Adhesive [Tape] Itching  . Penicillins Other (See Comments)    GI upset Has patient had a PCN reaction causing immediate rash, facial/tongue/throat swelling, SOB or lightheadedness with hypotension: No Has patient had a PCN reaction causing severe rash involving mucus membranes or skin necrosis: No Has patient had a PCN reaction that required hospitalization: No Has patient had a PCN reaction occurring within the last 10 years: No If all of the above answers are "NO", then may proceed with Cephalosporin use.    Immunization History  Administered Date(s) Administered  . Influenza, High Dose Seasonal PF 09/24/2018  . Influenza,inj,Quad PF,6+ Mos 11/21/2015  . PPD Test 05/20/2014    Past Medical History:  Diagnosis Date  . Adjustment disorder with anxiety   . Asthma   . Atrial fibrillation ([place])   . Benign essential hypertension   . COPD (chronic obstructive pulmonary disease) ([place])   . Coronary artery disease   . Diabetic neuropathic arthritis ([place])   . Heart palpitations   . Hypoxia 07/19/2018  . Other cirrhosis of liver ([place]) 07/20/2018  . Uncontrolled diabetes mellitus type 2 without complications     Tobacco History: Social History   Tobacco Use  Smoking Status Never Smoker  Smokeless Tobacco Never Used   Counseling given: Not Answered   Continue to not smoke  Outpatient Encounter Medications as of 03/07/2019  Medication Sig  . acetaminophen (TYLENOL) 325 MG tablet Take 2  tablets (650 mg total) by mouth every 6 (six) hours as needed for fever or headache.  . albuterol (PROVENTIL) (2.5 MG/3ML) 0.083% nebulizer solution Take 3 mLs (2.5 mg total) by nebulization every 6 (six) hours as needed for wheezing or shortness of breath.  Marland Kitchen albuterol (VENTOLIN HFA) 108 (90 Base) MCG/ACT inhaler Inhale 2 puffs into the lungs every 6 (six) hours as needed for wheezing.  Marland Kitchen apixaban (ELIQUIS) 5 MG TABS tablet Take 1 tablet (5 mg total) by mouth 2 (two) times daily.  Marland Kitchen arformoterol (BROVANA) 15 MCG/2ML NEBU Take 2 mLs (15 mcg total) by nebulization 2 (two) times daily.  Marland Kitchen azithromycin (ZITHROMAX) 250 MG tablet Take 1 tablet (250 mg total) by mouth every Monday, Wednesday, and Friday.  . Blood Glucose Monitoring Suppl (ACCU-CHEK AVIVA PLUS) w/Device KIT USE TO CHECK BLOOD SUGARS ONCE DAILY  . budesonide (PULMICORT) 0.25 MG/2ML nebulizer solution Take 2 mLs (0.25 mg total) by nebulization 2 (two) times daily.  . cetirizine (ZYRTEC) 10 MG tablet Take 1 tablet (10 mg total) by mouth daily.  . diphenhydrAMINE (BENADRYL) 12.5 MG/5ML elixir Take 12.5 mg by mouth daily as needed.   . famotidine (PEPCID) 20 MG tablet Take 20 mg by mouth daily before breakfast.   . ferrous sulfate 325 (65 FE) MG tablet Take 1 tablet (325 mg total) by mouth 3 (three) times daily with meals. (Patient taking differently: Take 325 mg by mouth daily. )  .  fluticasone (FLONASE) 50 MCG/ACT nasal spray Place 2 sprays into both nostrils 2 (two) times daily. (Patient taking differently: Place 2 sprays into both nostrils as needed. )  . furosemide (LASIX) 20 MG tablet Take 1 tablet (20 mg total) by mouth 2 (two) times daily.  Marland Kitchen glucose blood (ACCU-CHEK AVIVA PLUS) test strip USE WITH GLUCOMETER TO CHECK BLOOD SUGARS ONCE DAILY  . ipratropium-albuterol (DUONEB) 0.5-2.5 (3) MG/3ML SOLN Take 3 mLs by nebulization every 4 (four) hours as needed.  . isosorbide mononitrate (IMDUR) 30 MG 24 hr tablet Take 1 tablet by mouth as  needed.   . magic mouthwash w/lidocaine SOLN Take 5 mLs by mouth 3 (three) times daily as needed for mouth pain.  . metFORMIN (GLUCOPHAGE XR) 500 MG 24 hr tablet Take 1 tablet (500 mg total) by mouth daily with breakfast. (Patient taking differently: Take 500 mg by mouth 2 (two) times daily. )  . montelukast (SINGULAIR) 10 MG tablet Take 1 tablet (10 mg total) by mouth at bedtime. (Patient taking differently: Take 10 mg by mouth as needed. )  . Multiple Vitamin (MULTIVITAMIN WITH MINERALS) TABS tablet Take 1 tablet by mouth daily. (Patient taking differently: Take 1 tablet by mouth. occas)  . nitroGLYCERIN (NITROSTAT) 0.4 MG SL tablet Place 0.4 mg under the tongue every 5 (five) minutes as needed for chest pain.   . pantoprazole (PROTONIX) 40 MG tablet Take 1 tablet daily before first meal  . [DISCONTINUED] arformoterol (BROVANA) 15 MCG/2ML NEBU Take 2 mLs (15 mcg total) by nebulization 2 (two) times daily.  . [DISCONTINUED] budesonide (PULMICORT) 0.25 MG/2ML nebulizer solution Take 2 mLs (0.25 mg total) by nebulization 2 (two) times daily.  . [DISCONTINUED] cetirizine (ZYRTEC) 10 MG tablet Take 1 tablet (10 mg total) by mouth daily. (Patient taking differently: Take 10 mg by mouth as needed. )  . nystatin (MYCOSTATIN) 100000 UNIT/ML suspension Take 5 mLs (500,000 Units total) by mouth 4 (four) times daily.   No facility-administered encounter medications on file as of 03/07/2019.     Review of Systems  Review of Systems  Constitutional: Positive for fatigue. Negative for activity change and fever.  HENT: Positive for congestion and mouth sores. Negative for sinus pressure, sinus pain and sore throat.   Respiratory: Positive for cough and shortness of breath. Negative for wheezing.   Cardiovascular: Negative for chest pain and palpitations.  Gastrointestinal: Negative for diarrhea, nausea and vomiting.  Musculoskeletal: Negative for arthralgias.  Neurological: Negative for dizziness.    Psychiatric/Behavioral: Negative for sleep disturbance. The patient is not nervous/anxious.      Physical Exam  BP 120/70 (BP Location: Left Arm, Patient Position: Sitting, Cuff Size: Normal)   Pulse 71   Temp 98.4 F (36.9 C) (Temporal)   Ht _0  (1.575 m)   Wt 153 lb (69.4 kg)   SpO2 97% Comment: on RA  BMI 27.98 kg/m   Wt Readings from Last 5 Encounters:  03/07/19 153 lb (69.4 kg)  02/16/19 150 lb 12.7 oz (68.4 kg)  02/14/19 150 lb 12.8 oz (68.4 kg)  01/24/19 147 lb 9.6 oz (67 kg)  01/16/19 142 lb (64.4 kg)    BMI Readings from Last 5 Encounters:  03/07/19 27.98 kg/m  02/16/19 27.58 kg/m  02/14/19 27.58 kg/m  01/24/19 27.00 kg/m  01/16/19 25.97 kg/m     Physical Exam Vitals and nursing note reviewed.  Constitutional:      General: She is not in acute distress.    Appearance: Normal appearance. She  is normal weight.  HENT:     Head: Normocephalic and atraumatic.     Right Ear: Tympanic membrane, ear canal and external ear normal. There is no impacted cerumen.     Left Ear: Tympanic membrane, ear canal and external ear normal. There is no impacted cerumen.     Nose: Rhinorrhea present. No congestion.     Mouth/Throat:     Comments: Oral thrush  Eyes:     Pupils: Pupils are equal, round, and reactive to light.  Cardiovascular:     Rate and Rhythm: Normal rate and regular rhythm.     Pulses: Normal pulses.     Heart sounds: Normal heart sounds. No murmur.  Pulmonary:     Effort: Pulmonary effort is normal. No respiratory distress.     Breath sounds: Normal breath sounds. No decreased air movement. No decreased breath sounds, wheezing or rales.  Musculoskeletal:     Cervical back: Normal range of motion.  Skin:    General: Skin is warm and dry.     Capillary Refill: Capillary refill takes less than 2 seconds.  Neurological:     General: No focal deficit present.     Mental Status: She is alert and oriented to person, place, and time. Mental status is  at baseline.     Gait: Gait normal.  Psychiatric:        Mood and Affect: Mood is anxious and depressed. Affect is flat.        Behavior: Behavior normal.        Thought Content: Thought content normal.        Cognition and Memory: Cognition is impaired.        Judgment: Judgment normal.       Assessment & Plan:   Renal mass Plan: Keep regular follow-up [facility] Dr. Lovena Neighbours  Abnormal findings on diagnostic imaging of lung Plan: Continue to work with urology-Dr. Lovena Neighbours Personally contacted [facility] to get patient scheduled for follow-up with ear nose and throat MD on 03/16/2019 at 3:20 PM  Thyroid mass Family is under the impression the patient needs to see an endocrinologist for management of her thyroid There is significant confusion regarding the understanding of the patient's thyroid mass Patient is status post nodule biopsy [facility] try to reach the patient on 03/06/2019 to discuss results as well as follow-up they are unable to reach the patient  Plan: I personally contacted [facility] to get the patient scheduled with our office on 03/16/2019 at 3:20 PM I explained to the patient that she needs to keep this appointment and it would be best for a family member also to attend that with a are all in the same page with work-up  Complex care coordination  Patient continues to struggle with medical care follow-up Patient has complex care needs I attempted to reach the patient's daughter [name] over the phone today, left voicemail I personally contacted [facility]NT to get the patient scheduled for follow-up with our office on 03/16/2019 at 3:20 PM We have already has an office personally contacted on the behalf of the patient to get her established with a new primary care provider which is scheduled for 04/05/2019 at 11 AM  Plan: Patient has been scheduled for PCP follow-up on 04/05/2019 at 11 AM with Dr. Einar Pheasant  Patient has upcoming scheduled  [facility] follow-up on 03/16/2019  Patient reports that her neck scheduled follow-up [facility] is in April/2021 with Dr. Lovena Neighbours  I emphasized  the patient needs to keep these follow-ups and would be in her best interest for her to also have a family member or friend attend with her so that way she can have further support with coordination of her medical needs.  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  Patient reporting that she has not been using her Pulmicort nebs completely.  She has been only taking a half dose of these.  She continues to be adherent to Brovana nebs as well as Monday Wednesday Friday azithromycin.  We will treat her today for oral thrush.  Plan: Continue Monday Wednesday Friday azithromycin Continue Brovana Resume using Pulmicort nebs Continue probiotic Follow-up in 6 weeks  Thrush Plan: Nystatin rinse today    Return in about 6 weeks (around 04/18/2019), or if symptoms worsen or fail to improve, for Follow up with Dr. Valeta Harms.   Lauraine Rinne, NP 03/07/2019   This appointment required 45 minutes of patient care (this includes precharting, chart review, review of results, face-to-face care, etc.).

## 2019-03-07 ENCOUNTER — Telehealth: Payer: Self-pay | Admitting: Pulmonary Disease

## 2019-03-07 ENCOUNTER — Encounter: Payer: Self-pay | Admitting: Pulmonary Disease

## 2019-03-07 ENCOUNTER — Other Ambulatory Visit: Payer: Self-pay

## 2019-03-07 ENCOUNTER — Ambulatory Visit (INDEPENDENT_AMBULATORY_CARE_PROVIDER_SITE_OTHER): Payer: Medicare Other | Admitting: Pulmonary Disease

## 2019-03-07 VITALS — BP 120/70 | HR 71 | Temp 98.4°F | Ht 62.0 in | Wt 153.0 lb

## 2019-03-07 DIAGNOSIS — R918 Other nonspecific abnormal finding of lung field: Secondary | ICD-10-CM

## 2019-03-07 DIAGNOSIS — J4540 Moderate persistent asthma, uncomplicated: Secondary | ICD-10-CM

## 2019-03-07 DIAGNOSIS — Z7189 Other specified counseling: Secondary | ICD-10-CM | POA: Diagnosis not present

## 2019-03-07 DIAGNOSIS — B370 Candidal stomatitis: Secondary | ICD-10-CM | POA: Insufficient documentation

## 2019-03-07 DIAGNOSIS — E079 Disorder of thyroid, unspecified: Secondary | ICD-10-CM

## 2019-03-07 DIAGNOSIS — N2889 Other specified disorders of kidney and ureter: Secondary | ICD-10-CM

## 2019-03-07 MED ORDER — CETIRIZINE HCL 10 MG PO TABS: 10.0000 mg | ORAL_TABLET | Freq: Every day | ORAL | 6 refills | Status: DC

## 2019-03-07 MED ORDER — NYSTATIN 100000 UNIT/ML MT SUSP: 5.0000 mL | Freq: Four times a day (QID) | OROMUCOSAL | 0 refills | Status: DC

## 2019-03-07 MED ORDER — BUDESONIDE 0.25 MG/2ML IN SUSP: 0.2500 mg | Freq: Two times a day (BID) | RESPIRATORY_TRACT | 12 refills | Status: DC

## 2019-03-07 MED ORDER — ARFORMOTEROL TARTRATE 15 MCG/2ML IN NEBU: 15.0000 ug | INHALATION_SOLUTION | Freq: Two times a day (BID) | RESPIRATORY_TRACT | 6 refills | Status: DC

## 2019-03-07 NOTE — Progress Notes (Signed)
PCCM: thanks for speaking with her.  [facility] 03/07/2019 5:48 PM

## 2019-03-07 NOTE — Telephone Encounter (Signed)
Spoke with [name]  I advised that Sharon Spears will return to clinic 03/08/19  I asked if there was a specific time she would like to be reached or a time that would be best and she states no, she works from home and will try her best to answer  You can call her at [phone] Thanks!

## 2019-03-07 NOTE — Assessment & Plan Note (Signed)
Plan: Continue to work with urology-Dr. Lovena Neighbours Personally contacted [facility] ENT to get patient scheduled for follow-up with ear nose and throat MD on 03/16/2019 at 3:20 PM

## 2019-03-07 NOTE — Assessment & Plan Note (Signed)
Plan: Nystatin rinse today

## 2019-03-07 NOTE — Assessment & Plan Note (Signed)
Plan: Keep regular follow-up with [facility] Dr. Lovena Neighbours

## 2019-03-07 NOTE — Assessment & Plan Note (Signed)
  Patient continues to struggle with medical care follow-up Patient has complex care needs I attempted to reach the patient's daughter Sharon Spears over the phone today, left voicemail I personally contacted [facility] to get the patient scheduled for follow-up with our office on 03/16/2019 at 3:20 PM We have already has an office personally contacted on the behalf of the patient to get her established with a new primary care provider which is scheduled for 04/05/2019 at 11 AM  Plan: Patient has been scheduled for PCP follow-up on 04/05/2019 at 11 AM with Sharon Spears  Patient has upcoming scheduled [facility] follow-up on 03/16/2019  Patient reports that her neck scheduled follow-up [facility] is in April/2021 with Sharon Spears  I emphasized the patient needs to keep these follow-ups and would be in her best interest for her to also have a family member or friend attend with her so that way she can have further support with coordination of her medical needs.

## 2019-03-07 NOTE — Assessment & Plan Note (Signed)
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  Patient reporting that she has not been using her Pulmicort nebs completely.  She has been only taking a half dose of these.  She continues to be adherent to Brovana nebs as well as Monday Wednesday Friday azithromycin.  We will treat her today for oral thrush.  Plan: Continue Monday Wednesday Friday azithromycin Continue Brovana Resume using Pulmicort nebs Continue probiotic Follow-up in 6 weeks

## 2019-03-07 NOTE — Patient Instructions (Addendum)
You were seen today by Lauraine Rinne, NP  for:   1. Moderate persistent asthma, unspecified whether complicated  - budesonide (PULMICORT) 0.25 MG/2ML nebulizer solution; Take 2 mLs (0.25 mg total) by nebulization 2 (two) times daily.  Dispense: 120 mL; Refill: 12 - arformoterol (BROVANA) 15 MCG/2ML NEBU; Take 2 mLs (15 mcg total) by nebulization 2 (two) times daily.  Dispense: 120 mL; Refill: 6 - cetirizine (ZYRTEC) 10 MG tablet; Take 1 tablet (10 mg total) by mouth daily.  Dispense: 30 tablet; Refill: 6  Continue Monday Wednesday Friday azithromycin Continue Pulmicort nebs twice daily Continue Brovana nebs twice daily  Continue daily Zyrtec  Rinse her mouth out after using her nebulized medications   2. Complex care coordination  I attempted to call your daughter [name] regarding your care I have left her a voicemail  I personally called [facility] to get you scheduled for a follow-up appointment with them on 03/16/2019 at 3:20 PM  We have also already previously personally gotten you scheduled for a primary care appt on 04/05/2019 at 11 AM  I believe it would be beneficial for [name] to come to both your [facility] appointments as well as your primary care appointment in the next appointment with our office in 6 weeks  3. Thyroid mass  Follow-up with [facility] on 03/16/2019 at 3:20 PM   We recommend today:   Meds ordered this encounter  Medications  . budesonide (PULMICORT) 0.25 MG/2ML nebulizer solution    Sig: Take 2 mLs (0.25 mg total) by nebulization 2 (two) times daily.    Dispense:  120 mL    Refill:  12    Dx: J45.40  . arformoterol (BROVANA) 15 MCG/2ML NEBU    Sig: Take 2 mLs (15 mcg total) by nebulization 2 (two) times daily.    Dispense:  120 mL    Refill:  6    Dx: J45.40  . cetirizine (ZYRTEC) 10 MG tablet    Sig: Take 1 tablet (10 mg total) by mouth daily.    Dispense:  30 tablet    Refill:  6    Follow Up:    Return in about 6 weeks (around  04/18/2019), or if symptoms worsen or fail to improve, for Follow up with Dr. Valeta Harms.   Please do your part to reduce the spread of COVID-19:      Reduce your risk of any infection  and COVID19 by using the similar precautions used for avoiding the common cold or flu:  Marland Kitchen Wash your hands often with soap and warm water for at least 20 seconds.  If soap and water are not readily available, use an alcohol-based hand sanitizer with at least 60% alcohol.  . If coughing or sneezing, cover your mouth and nose by coughing or sneezing into the elbow areas of your shirt or coat, into a tissue or into your sleeve (not your hands). Langley Gauss A MASK when in public  . Avoid shaking hands with others and consider head nods or verbal greetings only. . Avoid touching your eyes, nose, or mouth with unwashed hands.  . Avoid close contact with people who are sick. . Avoid places or events with large numbers of people in one location, like concerts or sporting events. . If you have some symptoms but not all symptoms, continue to monitor at home and seek medical attention if your symptoms worsen. . If you are having a medical emergency, call 911.   ADDITIONAL HEALTHCARE OPTIONS FOR  [place] / e-Visit: [email or web address]         [facility]: [facility]: [phone]                   [facility]: [phone]     It is flu season:   >>> Best ways to protect herself from the flu: Receive the yearly flu vaccine, practice good hand hygiene washing with soap and also using hand sanitizer when available, eat a nutritious meals, get adequate rest, hydrate appropriately   Please contact the office if your symptoms worsen or you have concerns that you are not improving.   Thank you for choosing [facility] for your healthcare, and for allowing [place] to partner with you on your healthcare  journey. I am thankful to be able to provide care to you today.   Wyn Quaker FNP-C

## 2019-03-07 NOTE — Assessment & Plan Note (Signed)
Family is under the impression the patient needs to see an endocrinologist for management of her thyroid There is significant confusion regarding the understanding of the patient's thyroid mass Patient is status post nodule biopsy [facility] ENT try to reach the patient on 03/06/2019 to discuss results as well as follow-up they are unable to reach the patient  Plan: I personally contacted [facility]NT to get the patient scheduled with our office on 03/16/2019 at 3:20 PM I explained to the patient that she needs to keep this appointment and it would be best for a family member also to attend that with a are all in the same page with work-up

## 2019-03-08 ENCOUNTER — Encounter: Payer: Self-pay | Admitting: Pulmonary Disease

## 2019-03-08 ENCOUNTER — Ambulatory Visit: Payer: Medicare Other | Attending: Internal Medicine

## 2019-03-08 DIAGNOSIS — Z23 Encounter for immunization: Secondary | ICD-10-CM

## 2019-03-08 NOTE — Telephone Encounter (Signed)
03/08/2019  I was able to connect with [name] the patient's daughter via telephone today.  She was thankful for the call.  We were able to update [name] regarding the current work-up as well as upcoming appointments.  [name] struggles with the same barriers that our office has with the patient.  I have encouraged [name] to attend the upcoming appointments to make sure we are all on the same page as well as to help clarify future family and friends support for the patient to ensure that no appointments or work-ups are missed.  [name] reported that she is the way that she understands is with [facility] the patient is supposed to have a 86-monthfollow-up in June/July 2021.  I have explained ESherlynn Stallsthat I have gotten the patient [facility]20 PM.  She reports that she will be able to attend that appointment.  [facility] I also explained the patient is scheduled for a new primary care appointment on 04/05/2019 at 174AM.  Patient's daughter reports that she will not be able to make that appointment as it is difficult for her to make morning appointments work for her schedule.  She is also concerned about the location of that appointment.  I explained that this is where the patient wanted to establish care.  I have encouraged him to keep the primary care appointment they can always transfer to another lobe our system that is closer for the daughter to attend appointments in the future.  ESherlynn Stallsreported that she would prefer afternoon appointments in the future so that way she can attend with the patient.  We will send this message to triage to make sure that we can reach ESherlynn Stallstoday to reschedule patient's upcoming pulmonary appointment for an afternoon appointment slot with Dr. IValeta Harms  BWyn QuakerFNP

## 2019-03-08 NOTE — Progress Notes (Signed)
Covid-19 Vaccination Clinic  Name:  Sharon Spears    MRN: [id number] DOB: 1945-08-06  03/08/2019  Sharon Spears was observed post Covid-19 immunization for 15 minutes without incidence. She was provided with Vaccine Information Sheet and instruction to access the V-Safe system.   Sharon Spears was instructed to call 911 with any severe reactions post vaccine: Marland Kitchen Difficulty breathing  . Swelling of your face and throat  . A fast heartbeat  . A bad rash all over your body  . Dizziness and weakness    Immunizations Administered    Name Date Dose VIS Date Route   Pfizer COVID-19 Vaccine 03/08/2019  1:38 PM 0.3 mL 12/22/2018 Intramuscular   Manufacturer: [place]   Lot: [id number]   [place]: [id number]

## 2019-03-08 NOTE — Telephone Encounter (Signed)
lmtcb for pt.  

## 2019-03-08 NOTE — Telephone Encounter (Signed)
I spoke with Sharon Spears and have rescheduled the 04/25/19 appt to 2 pm.

## 2019-03-09 NOTE — Telephone Encounter (Signed)
lmtcb X2 for pt. Called pharmacy, who verified that a PA was needed for [place]. Initiated PA via [email or web address]:  Key: [id number] PA has been sent to plan, and a determination is expected within 3 days.  Will keep in triage for follow-up.

## 2019-03-12 MED ORDER — ARFORMOTEROL TARTRATE 15 MCG/2ML IN NEBU: 15.0000 ug | INHALATION_SOLUTION | Freq: Two times a day (BID) | RESPIRATORY_TRACT | 6 refills | Status: DC

## 2019-03-12 NOTE — Telephone Encounter (Signed)
Checked Cover My Meds. PA has been denied through Medicare Part D, but approved through Medicare Part B. Garlon Hatchet is denied for coverage under your Medicare Part D benefit. Drugs covered under your Medicare Part A and/or Part B are excluded from Part D prescription coverage under Medicare rules. However, you have satisfied the Part B criteria, and Brovana 24mg/2ml solution for inhalation, use as directed, is approved through 01/11/2020 under your Medicare Part B benefit. If you think Medicare Part D should cover this drug for you, you may appeal. Prescription will need to be printed and sent to DME. Will route to triage for follow up as the computer I am working at does not print prescriptions.

## 2019-03-12 NOTE — Telephone Encounter (Signed)
Per chart, pt uses [facility] as DME.  Their rx's do not need to be printed and faxed, they can be directly escribed to their mail order pharmacy, [facility].  This has been sent.   Spoke with pt, expressed understanding.  Nothing further needed at this time- will close encounter.

## 2019-03-14 ENCOUNTER — Emergency Department (HOSPITAL_COMMUNITY): Payer: Medicare Other

## 2019-03-14 ENCOUNTER — Emergency Department (HOSPITAL_COMMUNITY)
Admission: EM | Admit: 2019-03-14 | Discharge: 2019-03-14 | Disposition: A | Discharge status: Home / Self Care | Payer: Medicare Other | Attending: Emergency Medicine | Admitting: Emergency Medicine

## 2019-03-14 DIAGNOSIS — Z7984 Long term (current) use of oral hypoglycemic drugs: Secondary | ICD-10-CM | POA: Insufficient documentation

## 2019-03-14 DIAGNOSIS — M436 Torticollis: Secondary | ICD-10-CM | POA: Diagnosis not present

## 2019-03-14 DIAGNOSIS — R05 Cough: Secondary | ICD-10-CM | POA: Insufficient documentation

## 2019-03-14 DIAGNOSIS — J449 Chronic obstructive pulmonary disease, unspecified: Secondary | ICD-10-CM | POA: Insufficient documentation

## 2019-03-14 DIAGNOSIS — R110 Nausea: Secondary | ICD-10-CM | POA: Diagnosis not present

## 2019-03-14 DIAGNOSIS — E119 Type 2 diabetes mellitus without complications: Secondary | ICD-10-CM | POA: Insufficient documentation

## 2019-03-14 DIAGNOSIS — I4891 Unspecified atrial fibrillation: Secondary | ICD-10-CM | POA: Insufficient documentation

## 2019-03-14 DIAGNOSIS — R519 Headache, unspecified: Secondary | ICD-10-CM | POA: Diagnosis not present

## 2019-03-14 DIAGNOSIS — Z79899 Other long term (current) drug therapy: Secondary | ICD-10-CM | POA: Insufficient documentation

## 2019-03-14 DIAGNOSIS — I2510 Atherosclerotic heart disease of native coronary artery without angina pectoris: Secondary | ICD-10-CM | POA: Diagnosis not present

## 2019-03-14 DIAGNOSIS — M542 Cervicalgia: Secondary | ICD-10-CM | POA: Diagnosis present

## 2019-03-14 DIAGNOSIS — Z7901 Long term (current) use of anticoagulants: Secondary | ICD-10-CM | POA: Diagnosis not present

## 2019-03-14 LAB — CBC WITH DIFFERENTIAL/PLATELET
Abs Immature Granulocytes: 0.02 10*3/uL (ref 0.00–0.07)
Basophils Absolute: 0 10*3/uL (ref 0.0–0.1)
Basophils Relative: 0 %
Eosinophils Absolute: 0 10*3/uL (ref 0.0–0.5)
Eosinophils Relative: 0 %
HCT: 35.7 % — ABNORMAL LOW (ref 36.0–46.0)
Hemoglobin: 10.9 g/dL — ABNORMAL LOW (ref 12.0–15.0)
Immature Granulocytes: 0 %
Lymphocytes Relative: 20 %
Lymphs Abs: 1.1 10*3/uL (ref 0.7–4.0)
MCH: 24.2 pg — ABNORMAL LOW (ref 26.0–34.0)
MCHC: 30.5 g/dL (ref 30.0–36.0)
MCV: 79.2 fL — ABNORMAL LOW (ref 80.0–100.0)
Monocytes Absolute: 0.6 10*3/uL (ref 0.1–1.0)
Monocytes Relative: 11 %
Neutro Abs: 3.7 10*3/uL (ref 1.7–7.7)
Neutrophils Relative %: 69 %
Platelets: 148 10*3/uL — ABNORMAL LOW (ref 150–400)
RBC: 4.51 MIL/uL (ref 3.87–5.11)
RDW: 16.9 % — ABNORMAL HIGH (ref 11.5–15.5)
WBC: 5.4 10*3/uL (ref 4.0–10.5)
nRBC: 0 % (ref 0.0–0.2)

## 2019-03-14 LAB — COMPREHENSIVE METABOLIC PANEL
ALT: 31 U/L (ref 0–44)
AST: 29 U/L (ref 15–41)
Albumin: 3.5 g/dL (ref 3.5–5.0)
Alkaline Phosphatase: 73 U/L (ref 38–126)
Anion gap: 9 (ref 5–15)
BUN: 19 mg/dL (ref 8–23)
CO2: 24 mmol/L (ref 22–32)
Calcium: 9 mg/dL (ref 8.9–10.3)
Chloride: 103 mmol/L (ref 98–111)
Creatinine, Ser: 0.86 mg/dL (ref 0.44–1.00)
GFR calc Af Amer: >60 mL/min (ref >60)
GFR calc non Af Amer: >60 mL/min (ref >60)
Glucose, Bld: 142 mg/dL — ABNORMAL HIGH (ref 70–99)
Potassium: 4.2 mmol/L (ref 3.5–5.1)
Sodium: 136 mmol/L (ref 135–145)
Total Bilirubin: 0.8 mg/dL (ref 0.3–1.2)
Total Protein: 7.2 g/dL (ref 6.5–8.1)

## 2019-03-14 MED ORDER — ACETAMINOPHEN 325 MG PO TABS
650.0000 mg | ORAL_TABLET | Freq: Once | ORAL | Status: AC
  Administered 2019-03-14: 09:00:00 650 mg via ORAL
  Filled 2019-03-14: qty 2

## 2019-03-14 MED ORDER — LIDOCAINE 5 % EX PTCH
1.0000 | MEDICATED_PATCH | CUTANEOUS | Status: DC
  Administered 2019-03-14: 09:00:00 1 via TRANSDERMAL
  Filled 2019-03-14: qty 1

## 2019-03-14 MED ORDER — TIZANIDINE HCL 4 MG PO TABS
2.0000 mg | ORAL_TABLET | Freq: Once | ORAL | Status: AC
  Administered 2019-03-14: 09:00:00 2 mg via ORAL
  Filled 2019-03-14: qty 1

## 2019-03-14 MED ORDER — GADOBUTROL 1 MMOL/ML IV SOLN
7.0000 mL | Freq: Once | INTRAVENOUS | Status: AC | PRN
  Administered 2019-03-14: 11:00:00 7 mL via INTRAVENOUS

## 2019-03-14 MED ORDER — LIDOCAINE 5 % EX PTCH: 1.0000 | MEDICATED_PATCH | CUTANEOUS | 0 refills | Status: DC

## 2019-03-14 NOTE — ED Triage Notes (Signed)
Pt presents from home for neck stiffness/pain (feels like pulling), HA, fever x3 days. Pain in neck began as pain in throat. Pt states she believes sx are r/t covid vaccine received on 03/08/19. H/o DM, HTN, asthma, "thyroid issue". Has tried advil, bengay patches, wrapping an ace bandage around her neck. EMS exam: lungs clear, A&Ox4, neck pain elicited by ROM and palpation, pain in posterior>anterior

## 2019-03-14 NOTE — ED Notes (Signed)
Got patient undress into a gown on the monitor patient is resting with call bell in reach and nurse at bedside

## 2019-03-14 NOTE — ED Notes (Signed)
ED Provider at bedside. 

## 2019-03-14 NOTE — ED Notes (Signed)
Pt declined discharge vital signs

## 2019-03-14 NOTE — Discharge Instructions (Signed)
You had an MRI of your neck today that showed arthritis in your neck.  Please follow up with your family doctor for recheck.

## 2019-03-14 NOTE — ED Provider Notes (Signed)
[facility] EMERGENCY DEPARTMENT Provider Note   CSN: [id number] Arrival date & time: 03/14/19  0847     History Chief Complaint  Patient presents with  . Torticollis    Sharon Spears is a 74 y.o. female.  The history is provided by the patient, the EMS personnel and medical records. No language interpreter was used.   Sharon Spears is a 74 y.o. female who presents to the Emergency Department complaining of neck pain. She presents the emergency department by EMS complaining of two days of severe posterior neck pain. She received a COVID-19 vaccine on February 25. Two days ago she began to develop gradually progressive neck pain with significant worsening if she tries to range her neck. She denies any injuries. Pain is described as a severe muscle pulling sensation. She also has been dealing with two months of cough productive of thick white mucus as well as anterior neck soreness that has been evaluated by ENT. She states that her neck soreness anteriorly and sore throat are likely secondary to her coughing. She also complains of five months of band like headache across the top of her head that is waxing and waning. She has occasional nausea. She denies any chest pain, vomiting, new weakness, new numbness. She reports temperatures to 100 at home starting three days ago. She lives at home alone. She has a history of diabetes, heart disease. She takes Eliquis - last dose last night. She states that her neck feels better wrapped up. She she is try ibuprofen at home for pain.    Past Medical History:  Diagnosis Date  . Adjustment disorder with anxiety   . Asthma   . Atrial fibrillation ([place])   . Benign essential hypertension   . COPD (chronic obstructive pulmonary disease) ([place])   . Coronary artery disease   . Diabetic neuropathic arthritis ([place])   . Heart palpitations   . Hypoxia 07/19/2018  . Other cirrhosis of liver ([place]) 07/20/2018  . Uncontrolled diabetes mellitus  type 2 without complications     Patient Active Problem List   Diagnosis Date Noted  . Thrush 03/07/2019  . Severe persistent asthma 02/14/2019  . Medication monitoring encounter 02/14/2019  . COPD exacerbation ([place]) 01/24/2019  . Episode of recurrent major depressive disorder ([place]) 01/24/2019  . Coronary artery disease of native artery of native heart with stable angina pectoris ([place]) 01/24/2019  . Advice given about COVID-19 virus infection 12/27/2018  . Thyroid mass 12/01/2018  . Renal mass 12/01/2018  . Abnormal findings on diagnostic imaging of lung 12/01/2018  . Complex care coordination 12/01/2018  . Other cirrhosis of liver ([place]) 07/20/2018  . Unspecified atrial fibrillation ([place]) 07/20/2018  . Acute respiratory failure with hypoxemia ([place]) 07/19/2018  . Asthma 06/12/2018  . Cough 02/08/2018  . Shortness of breath 02/08/2018  . Acute on chronic left systolic heart failure ([place]) 09/25/2017  . Thrombocytopenia ([place]) 03/20/2015  . Type 2 diabetes mellitus with other specified complication ([place]) 50/56/9794  . HTN (hypertension) 03/20/2015  . S/P CABG (coronary artery bypass graft) 03/20/2015    Past Surgical History:  Procedure Laterality Date  . CARDIAC SURGERY    . CESAREAN SECTION    . CORONARY ARTERY BYPASS GRAFT  07/06/2017   Redo-sternotomy and CABG 2 with LIMA to LAD, SVG to OM, and a a closure, mitral valve repair with #30 mm [place] ring on 07/06/2017  . ESOPHAGEAL MANOMETRY N/A 03/06/2012   Procedure: ESOPHAGEAL MANOMETRY (EM);  Surgeon: Beryle Beams, MD;  Location: [facility] END[facility]COPY;  Service: Endoscopy;  Laterality: N/A;  . IR ANGIOGRAM SELECTIVE EACH ADDITIONAL VESSEL  02/16/2019  . IR ANGIOGRAM SELECTIVE EACH ADDITIONAL VESSEL  02/16/2019  . IR ANGIOGRAM SELECTIVE EACH ADDITIONAL VESSEL  02/16/2019  . IR ANGIOGRAM SELECTIVE EACH ADDITIONAL VESSEL  02/16/2019  . IR RENAL SELECTIVE  UNI INC S&I MOD SED  02/16/2019  . IR US GUIDE VASC ACCESS RIGHT  02/16/2019  .  TONSILLECTOMY       OB History   No obstetric history on file.     Family History  Problem Relation Age of Onset  . Asthma Mother   . Alcohol abuse Father     Social History   Tobacco Use  . Smoking status: Never Smoker  . Smokeless tobacco: Never Used  Substance Use Topics  . Alcohol use: No  . Drug use: Never    Home Medications Prior to Admission medications   Medication Sig Start Date End Date Taking? Authorizing Provider  acetaminophen (TYLENOL) 325 MG tablet Take 2 tablets (650 mg total) by mouth every 6 (six) hours as needed for fever or headache. 10/31/18   Horton, Barbette Hair, MD  albuterol (PROVENTIL) (2.5 MG/3ML) 0.083% nebulizer solution Take 3 mLs (2.5 mg total) by nebulization every 6 (six) hours as needed for wheezing or shortness of breath. 06/12/18   Fenton Foy, NP  albuterol (VENTOLIN HFA) 108 (90 Base) MCG/ACT inhaler Inhale 2 puffs into the lungs every 6 (six) hours as needed for wheezing. 06/12/18   Fenton Foy, NP  apixaban (ELIQUIS) 5 MG TABS tablet Take 1 tablet (5 mg total) by mouth 2 (two) times daily. 09/28/17   Charolette Forward, MD  arformoterol (BROVANA) 15 MCG/2ML NEBU Take 2 mLs (15 mcg total) by nebulization 2 (two) times daily. 03/12/19   [name], DO  azithromycin (ZITHROMAX) 250 MG tablet Take 1 tablet (250 mg total) by mouth every Monday, Wednesday, and Friday. 02/14/19 05/15/19  [name], DO  Blood Glucose Monitoring Suppl (ACCU-CHEK AVIVA PLUS) w/Device KIT USE TO CHECK BLOOD SUGARS ONCE DAILY 10/10/18   [provider]  budesonide (PULMICORT) 0.25 MG/2ML nebulizer solution Take 2 mLs (0.25 mg total) by nebulization 2 (two) times daily. 03/07/19   Lauraine Rinne, NP  cetirizine (ZYRTEC) 10 MG tablet Take 1 tablet (10 mg total) by mouth daily. 03/07/19   Lauraine Rinne, NP  diphenhydrAMINE (BENADRYL) 12.5 MG/5ML elixir Take 12.5 mg by mouth daily as needed.  12/05/18   [provider]  famotidine (PEPCID) 20 MG tablet  Take 20 mg by mouth daily before breakfast.  02/04/15   [provider]  ferrous sulfate 325 (65 FE) MG tablet Take 1 tablet (325 mg total) by mouth 3 (three) times daily with meals. Patient taking differently: Take 325 mg by mouth daily.  09/28/17   Charolette Forward, MD  fluticasone (FLONASE) 50 MCG/ACT nasal spray Place 2 sprays into both nostrils 2 (two) times daily. Patient taking differently: Place 2 sprays into both nostrils as needed.  05/04/18   Lamptey, Myrene Galas, MD  furosemide (LASIX) 20 MG tablet Take 1 tablet (20 mg total) by mouth 2 (two) times daily. 07/21/18   Samuella Cota, MD  glucose blood (ACCU-CHEK AVIVA PLUS) test strip USE WITH GLUCOMETER TO CHECK BLOOD SUGARS ONCE DAILY 10/10/18   [provider]  ipratropium-albuterol (DUONEB) 0.5-2.5 (3) MG/3ML SOLN Take 3 mLs by nebulization every 4 (four) hours as needed. 01/24/19   June Leap  L, DO  isosorbide mononitrate (IMDUR) 30 MG 24 hr tablet Take 1 tablet by mouth as needed.  07/12/18   [provider]  lidocaine (LIDODERM) 5 % Place 1 patch onto the skin daily. Remove & Discard patch within 12 hours or as directed by MD 03/14/19   Quintella Reichert, MD  magic mouthwash w/lidocaine SOLN Take 5 mLs by mouth 3 (three) times daily as needed for mouth pain. 12/05/18   Wieters, Hallie C, PA-C  metFORMIN (GLUCOPHAGE XR) 500 MG 24 hr tablet Take 1 tablet (500 mg total) by mouth daily with breakfast. Patient taking differently: Take 500 mg by mouth 2 (two) times daily.  06/02/16   Golden Circle, FNP  montelukast (SINGULAIR) 10 MG tablet Take 1 tablet (10 mg total) by mouth at bedtime. Patient taking differently: Take 10 mg by mouth as needed.  06/12/18   Fenton Foy, NP  Multiple Vitamin (MULTIVITAMIN WITH MINERALS) TABS tablet Take 1 tablet by mouth daily. Patient taking differently: Take 1 tablet by mouth. occas 09/28/17   Charolette Forward, MD  nitroGLYCERIN (NITROSTAT) 0.4 MG SL tablet Place 0.4 mg under the  tongue every 5 (five) minutes as needed for chest pain.     [provider]  nystatin (MYCOSTATIN) 100000 UNIT/ML suspension Take 5 mLs (500,000 Units total) by mouth 4 (four) times daily. 03/07/19   Lauraine Rinne, NP  pantoprazole (PROTONIX) 40 MG tablet Take 1 tablet daily before first meal 01/08/19   Tanda Rockers, MD    Allergies    Contrast media [iodinated diagnostic agents], Adhesive [tape], and Penicillins  Review of Systems   Review of Systems  All other systems reviewed and are negative.   Physical Exam Updated Vital Signs BP (!) 146/54 (BP Location: Right Arm)   Pulse (!) 58   Temp 97.8 F (36.6 C) (Oral)   Resp (!) 22   SpO2 97%   Physical Exam Vitals and nursing note reviewed.  Constitutional:      Appearance: She is well-developed.  HENT:     Head: Normocephalic and atraumatic.     Right Ear: Tympanic membrane normal.     Left Ear: Tympanic membrane normal.     Mouth/Throat:     Mouth: Mucous membranes are moist.     Pharynx: No oropharyngeal exudate or posterior oropharyngeal erythema.  Neck:     Comments: Posterior neck is tender to palpation. Unable to range neck secondary to pain. No significant lymphadenopathy. No thyromegaly. Cardiovascular:     Rate and Rhythm: Normal rate and regular rhythm.  Pulmonary:     Effort: Pulmonary effort is normal. No respiratory distress.  Abdominal:     Palpations: Abdomen is soft.     Tenderness: There is no abdominal tenderness. There is no guarding or rebound.  Musculoskeletal:        General: No tenderness.     Comments: 2+ radial and DP pulses bilaterally.  Skin:    General: Skin is warm and dry.  Neurological:     Mental Status: She is alert and oriented to person, place, and time.     Comments: Five out of five strength in all four extremities with sensation to light touch intact in all four extremities  Psychiatric:        Behavior: Behavior normal.     ED Results / Procedures / Treatments     Labs (all labs ordered are listed, but only abnormal results are displayed) Labs Reviewed  COMPREHENSIVE METABOLIC PANEL -  Abnormal; Notable for the following components:      Result Value   Glucose, Bld 142 (*)    All other components within normal limits  CBC WITH DIFFERENTIAL/PLATELET - Abnormal; Notable for the following components:   Hemoglobin 10.9 (*)    HCT 35.7 (*)    MCV 79.2 (*)    MCH 24.2 (*)    RDW 16.9 (*)    Platelets 148 (*)    All other components within normal limits    EKG None  Radiology DG Chest 2 View  Result Date: 03/14/2019 CLINICAL DATA:  Cough. EXAM: CHEST - 2 VIEW COMPARISON:  February 16, 2019. FINDINGS: Stable cardiomegaly. Sternotomy wires are noted. No pneumothorax is noted. Mild right middle lobe subsegmental atelectasis is noted. Stable small left pleural effusion is noted with associated left basilar atelectasis. Bony thorax is unremarkable. IMPRESSION: Stable small left pleural effusion with associated left basilar atelectasis. Mild right middle lobe subsegmental atelectasis. Electronically Signed   By: Marijo Conception M.D.   On: 03/14/2019 09:56   MR Cervical Spine W or Wo Contrast  Result Date: 03/14/2019 CLINICAL DATA:  Severe posterior neck pain with neck stiffness and headache and fever. EXAM: MRI CERVICAL SPINE WITHOUT AND WITH CONTRAST TECHNIQUE: Multiplanar and multiecho pulse sequences of the cervical spine, to include the craniocervical junction and cervicothoracic junction, were obtained without and with intravenous contrast. CONTRAST:  69m GADAVIST GADOBUTROL 1 MMOL/ML IV SOLN COMPARISON:  Cervical radiographs dated 03/03/2018 and CT scan of the chest dated 11/23/2018 FINDINGS: Alignment: Physiologic. Vertebrae: No fracture, evidence of discitis, or bone lesion. Cord: Multiple images are degraded by motion artifact. Detail of the cervical spinal cord is nondiagnostic. However, the spinal cord does appear slightly compressed at C3-4 and C4-5  and C5-6. Posterior Fossa, vertebral arteries, paraspinal tissues: No significant abnormality of the posterior fossa. Vertebral arteries are patent. Chronic dominant nodule in the left lobe of the thyroid gland, previously assessed by ultrasound on 01/03/2019. Trachea is deviated to the right by this thyroid nodule. No change since the CT scan of the chest dated 11/23/2018. Disc levels: C2-3: Negative. C3-4: Disc space narrowing. Broad-based disc osteophyte complex asymmetric to the right with slight compression of the ventral aspect of the spinal cord. C4-5: Disc space narrowing. Asymmetric disc osteophyte complex to the right extending into the right lateral recess with slight compression of ventral aspect of the spinal cord. C5-6: Disc space narrowing. Broad-based disc osteophyte complex asymmetric to the left. C6-7: Disc space narrowing. Broad-based disc osteophyte complex asymmetric to the left. C7-T1: Negative. There is no significant facet arthritis in the cervical spine. No edema in the posterior paraspinal musculature. No pathologic enhancement of the cervical spine and adjacent soft tissues after contrast administration. IMPRESSION: 1. No acute abnormalities of the cervical spine. 2. Multilevel degenerative disc disease with slight compression of the ventral aspect of the spinal cord at C3-4, C4-5, and C5-6. 3. Detail is limited due to motion artifact on all sequences. I cannot determine if the patient has knee myelopathy in the cervical spinal cord as a result of the compression of the cord at several levels as described above. Electronically Signed   By: JLorriane ShireM.D.   On: 03/14/2019 11:13    Procedures Procedures (including critical care time)  Medications Ordered in ED Medications  lidocaine (LIDODERM) 5 % 1 patch (1 patch Transdermal Patch Applied 03/14/19 0912)  tiZANidine (ZANAFLEX) tablet 2 mg (2 mg Oral Given 03/14/19 0912)  acetaminophen (TYLENOL) tablet  650 mg (650 mg Oral Given  03/14/19 0912)  gadobutrol (GADAVIST) 1 MMOL/ML injection 7 mL (7 mLs Intravenous Contrast Given 03/14/19 1053)    ED Course  I have reviewed the triage vital signs and the nursing notes.  Pertinent labs & imaging results that were available during my care of the patient were reviewed by me and considered in my medical decision making (see chart for details).    MDM Rules/Calculators/A&P                     Patient here for evaluation of posterior neck pain, recently did receive the COVID-19 vaccine. She is unable to range her neck secondary to pain but is non-toxic appearing, neurologically intact on examination. MRI does have some degenerative changes but no evidence of epidural abscess or significant cord compression. She is feeling partially improved after treatment in the emergency department. She did have some dizziness after tizanidine administration, will not provide this is a prescription for home. Discussed with patient home care for cervical pain, torticollis. Recommend Tylenol, according to label instructions as well as Lidoderm patches. Findings of studies also discussed with the patient's daughter. Discussed outpatient follow-up and return precautions.  Final Clinical Impression(s) / ED Diagnoses Final diagnoses:  Torticollis, acute    Rx / DC Orders ED Discharge Orders         Ordered    lidocaine (LIDODERM) 5 %  Every 24 hours     03/14/19 1339           Quintella Reichert, MD 03/14/19 1514

## 2019-03-20 ENCOUNTER — Telehealth: Payer: Self-pay | Admitting: *Deleted

## 2019-03-20 NOTE — Telephone Encounter (Signed)
Pt called regarding Rx location.  RNCM reviewed chart to find out which Pharmacy Rx was faxed to.  RNCM contacted pharmacy to ensure Rx was there and available for pick-up.  Pharmacy states Rx was ready for pick-up.  RNCM returned call to pt to inform her that Rx was available and ready for pick-up.

## 2019-03-22 ENCOUNTER — Encounter (HOSPITAL_COMMUNITY): Payer: Self-pay

## 2019-04-03 ENCOUNTER — Ambulatory Visit: Payer: Medicare Other | Attending: Internal Medicine

## 2019-04-03 DIAGNOSIS — Z23 Encounter for immunization: Secondary | ICD-10-CM

## 2019-04-03 NOTE — Progress Notes (Signed)
   Covid-19 Vaccination Clinic  Name:  Sharon Spears    MRN: [id number] DOB: 1945-03-10  04/03/2019  Sharon Spears was observed post Covid-19 immunization for 15 minutes without incident. She was provided with Vaccine Information Sheet and instruction to access the V-Safe system.   Sharon Spears was instructed to call 911 with any severe reactions post vaccine: Marland Kitchen Difficulty breathing  . Swelling of face and throat  . A fast heartbeat  . A bad rash all over body  . Dizziness and weakness   Immunizations Administered    Name Date Dose VIS Date Route   Pfizer COVID-19 Vaccine 04/03/2019  1:30 PM 0.3 mL 12/22/2018 Intramuscular   Manufacturer: [place]   Lot: [id number]   [place]: [id number]

## 2019-04-04 ENCOUNTER — Telehealth: Payer: Self-pay | Admitting: Pulmonary Disease

## 2019-04-04 DIAGNOSIS — J4540 Moderate persistent asthma, uncomplicated: Secondary | ICD-10-CM

## 2019-04-04 NOTE — Telephone Encounter (Signed)
Spoke with patient. She stated that she was attempting to sanitize her nebulizer cup by boiling it and forgot about. By the time she remembered it, it was too late and it had melted. She believes the DME she received her supplies from is [facility]. I advised her that I would go ahead and place an order for new supplies. Did educate her that in the future to not place plastic cups in a pot of boiling water as this could release toxins in the air. She verbalized understanding.   Spoke with Dr. Valeta Harms via [place]. He approved the order. Order has been placed.   Nothing further needed at time of call.

## 2019-04-05 ENCOUNTER — Ambulatory Visit: Payer: Medicare Other | Admitting: Family Medicine

## 2019-04-06 ENCOUNTER — Telehealth: Payer: Self-pay | Admitting: Pulmonary Disease

## 2019-04-06 MED ORDER — PREDNISONE 10 MG PO TABS: ORAL_TABLET | ORAL | 0 refills | Status: DC

## 2019-04-06 MED ORDER — AZITHROMYCIN 250 MG PO TABS: ORAL_TABLET | ORAL | 0 refills | Status: DC

## 2019-04-06 NOTE — Telephone Encounter (Signed)
04/06/2019  Please confirm with the patient whether or not she is received all of her nebulizer supplies after she melted them by boiling.  If she has not received them please contact [facility] directly from triage to ensure that they are getting these medications to the patient as she needs them for her maintenance nebs as well as her rescue nebs.  Okay to order:  Azithromycin 244m tablet  >>>Take 2 tablets (5023mtotal) today, and then 1 tablet (25029mfor the next four days  >>>take with food  >>>can also take probiotic and / or yogurt while on antibiotic   Prednisone 30m69mblet  >>>Take 2 tablets (20 mg total) daily for the next 5 days >>> Take with food in the morning   Please emphasized the patient that if symptoms are not improving and she continues to have sinus congestion she will need to contact ear nose and throat and schedule an appointment with them for further evaluation.  Patient also needs to coordinate her own appointment with primary care since she canceled the one that our office coordinated on her behalf.  We will not be coordinating any more of these appointments.  She needs to understand that she needs a primary care provider.  I have explained this to the patient multiple times as well as her daughter.  She needs to have a primary care provider.  When she has obtained when she needs to contact our office and let us [place] so that we can make sure we are sending appropriate charts to it.  If she needs help obtaining a primary care provider.  Please direct [facility] to the [facility] website or provide a telephone number to a local primary care office where the patient can call and schedule this herself.  Or her daughter can help manage this for her as previously discussed.  Routing to Dr. IcarValeta HarmsFYI.Juluis RainierBriaWyn QuakerP

## 2019-04-06 NOTE — Telephone Encounter (Signed)
Pt aware of recs.  rx's sent to pharmacy as requested. Pt states she has neb meds but does not have cups/tubing for neb. Called [employer], verified that they've received the order and tried to call pt to set up delivery, but was unsuccessful in reaching patient.  Verified the 2 numbers on file that we have.  They will reach back out to pt.

## 2019-04-06 NOTE — Telephone Encounter (Signed)
Pt c/o increased prod cough with thick white mucus X2 days.  Also notes sinus congestion, PND sob.    Denies fever, chest pain, loss of taste/smell, headache, chills.    Taking mucinex, using sinus rinse to help with s/s.  Requesting additional recs.  No televisits available for any of the NP's today.  Pt states this cannot wait to be addressed until Monday.   Pt also notes that her nebulizer cup is damaged and she has not received this yet.  See 3/24 telephone note- this has been ordered for pt.   Pharmacy: [facility] on [address].  Sending to APP for recs.  Aaron Edelman please advise.  Thanks.

## 2019-04-08 ENCOUNTER — Other Ambulatory Visit: Payer: Self-pay | Admitting: Nurse Practitioner

## 2019-04-09 ENCOUNTER — Telehealth: Payer: Self-pay | Admitting: Pulmonary Disease

## 2019-04-09 NOTE — Telephone Encounter (Signed)
I called and spoke with the patient and made her aware that it shows that they received the order. I advised her that I would reach out to them and tell them the importance of needing to take your nebulized medications. I have sent a high priority message to [facility] and staff. Will hold in triage till I hear back.

## 2019-04-09 NOTE — Telephone Encounter (Signed)
Pt called back regarding this.

## 2019-04-09 NOTE — Telephone Encounter (Signed)
Received a message back from [facility] with [employer] and she states that they called patient on Friday and was not able to reach her and they left a voicemail. She states that she will contact her again. Nothing further is needed.

## 2019-04-25 ENCOUNTER — Ambulatory Visit (INDEPENDENT_AMBULATORY_CARE_PROVIDER_SITE_OTHER): Payer: Medicare Other | Admitting: Pulmonary Disease

## 2019-04-25 ENCOUNTER — Other Ambulatory Visit: Payer: Self-pay

## 2019-04-25 ENCOUNTER — Ambulatory Visit: Payer: Medicare Other | Admitting: Pulmonary Disease

## 2019-04-25 ENCOUNTER — Encounter: Payer: Self-pay | Admitting: Pulmonary Disease

## 2019-04-25 VITALS — BP 142/78 | HR 65 | Temp 97.6°F | Ht 62.0 in | Wt 148.6 lb

## 2019-04-25 DIAGNOSIS — R05 Cough: Secondary | ICD-10-CM

## 2019-04-25 DIAGNOSIS — J4540 Moderate persistent asthma, uncomplicated: Secondary | ICD-10-CM | POA: Diagnosis not present

## 2019-04-25 DIAGNOSIS — R059 Cough, unspecified: Secondary | ICD-10-CM

## 2019-04-25 DIAGNOSIS — J411 Mucopurulent chronic bronchitis: Secondary | ICD-10-CM

## 2019-04-25 NOTE — Patient Instructions (Signed)
Thank you for visiting Dr. Valeta Harms at [facility]. Today we recommend the following:  Continue your nebulizer therapy  Sleep with head of bed up  Do not eat meals late Samples of Deylsum to use before going to bed   Return in about 3 months (around 07/25/2019).    Please do your part to reduce the spread of COVID-19.

## 2019-04-25 NOTE — Progress Notes (Signed)
Synopsis: Referred in Jan. 2020 for coughing, pain, headache by Sharon Side., FNP  Subjective:   PATIENT ID: Sharon Spears GENDER: female DOB: 14-Jul-1945, MRN: [id number]  Chief Complaint  Patient presents with  . Follow-up    This is a 74 year old with complaints of fever and body aches.  She was recently treated with an upper respiratory tract infection approximately month ago.  She was seen in the emergency room.  She was referred to the pulmonologist office for respiratory complaints following this.  She does have known congestive heart failure.  She is a lifelong non-smoker.  But she did spend nearly 50 years in [place].  She is concerned for air pollution and smog affect to her pulmonary function.  Over the past several weeks she has had progressive dyspnea.  She states that she has been compliant with her congestive heart failure regimen.  She has noticed some mild increase in the swelling of her legs.  She recently followed up with her cardiologist last week.  She does have cough that is productive at times, chest tightness, denies hemoptysis.  OV 09/05/2018: Since last office visit patient has had multiple episodes of recurrent cough and congestion.  She has been seen by 1 of our nurse practitioners in March, ER April x2, office June, ER July for all similar symptoms.  Called into the office again on August 21 with complaints of congestion.  Chest imaging in June stable consistent with prior CABG and sternotomy. Chest x-ray in July with basilar atelectasis in the left pleural effusion.  Echocardiogram in July 2020 with a depressed ejection fraction 45 to 50%.  Left ventricular hypokinesis. She wakes up every night with coughing symptoms. At night time she had worse symptoms.  He has ongoing nocturnal wheezing.  She always feels like she has a tickle in her throat.  During the daytime is better.  Nighttime is much worse.  She will occasionally steam a pot of hot water and breathe in  steam in the middle of the night which helps soothe her coughing spells.  OV 11/01/2018: Patient last seen in the office in August.  Unfortunately recently had an exacerbation requiring antibiotics.  Possible viral URI symptoms.  She was treated with antibiotics.  She also had a migraine was seen in the emergency room this was also treated.  She was Covid tested which was negative.  She is followed by cardiology for chronic systolic heart failure.  She takes her medications regularly.  Has a history of asthma.  Uses her Symbicort daily.  Her symptoms at this point are better.  We reviewed her pulmonary function test that were completed back in January.  We looked at her chest x-ray that was recently completed in the ER.  She still has some left-sided elevated hemidiaphragm plus scarring in the left base.  History of CABG.  OV 12/13/2018: This is Sharon Spears here today for follow-up after recent ER visit.  Unfortunately found to have left upper lobe infiltrate.  Was treated for community-acquired pneumonia and given Levaquin.  Overall at this time she is breathing much better.  She denies any fevers chills night sweats weight loss.  No cough no sputum production.  She is sleeping better.  She was given doxycycline and prednisone via telemetry visit with Sharon Quaker, NP.  Patient unfortunately had insomnia.  She thinks this was related to the doxycycline however more likely related to the prednisone dosing.  However she is breathing better.  She  does present very tearful today and very sad that she is unable to visit with family.  She has a daughter in [place] who is a Marine scientist and a son who is a [place] in [place].  She spends most of the time alone and she is very sad about not being able to be with people.  But she understands the importance of this right now.    OV 01/24/2019: Patient doing better today.  Recently treated for exacerbation with steroids again.  She routinely feels better after treatment  with steroids.  Unfortunately she does not use a regular maintenance inhaler.  She finds several excuses not to use her Symbicort inhaler.  When she does use it she does feel better but routinely forgets to use it then will have exacerbations requiring shortness of breath and steroids again.  She is very anxious at baseline and feels anxious when she is home alone.  I believe this plays some into her symptoms.  Today overall feeling better than she was before.  Did have follow-up with ENT with planned biopsy of the thyroid nodule.  Additionally has a renal mass that has seen urology with plans for routine image follow-up.  At least this is what she told me.  She states to me that she prefers using her nebulizer than using her inhaler she does not feel like she is able to operate the inhalers appropriately.  OV 02/14/2019: Recently seen by telephone visit.  Treated for a sinus infection.  Today, still having recurrent exacerbations and steroids recently requiring antibiotics.  Recently completed another course due to chest congestion nasal congestion sinus issues.  She recently had a biopsy of the thyroid nodule.  She has follow-up with [facility] for this.  At this time denies congestion fevers cough night sweats weight loss or chills.  OV for 10/31/2019: Patient here today complaining of nocturnal cough shortness of breath that is persistent.  She has very thick sputum.  This has been an ongoing issue for her she has chronic bronchitis.  We discussed this in detail again today.  She has been on multiple courses of antibiotics and steroids.  I do not think that this makes a difference in her symptoms she takes them for short period and the symptoms persist.  She does feel much better using her nebulizer therapy versus the inhalers.  She would like to continue the nebulizers if at all possible.    Past Medical History:  Diagnosis Date  . Adjustment disorder with anxiety   . Asthma   . Atrial  fibrillation ([place])   . Benign essential hypertension   . COPD (chronic obstructive pulmonary disease) ([place])   . Coronary artery disease   . Diabetic neuropathic arthritis ([place])   . Heart palpitations   . Hypoxia 07/19/2018  . Other cirrhosis of liver ([place]) 07/20/2018  . Uncontrolled diabetes mellitus type 2 without complications      Family History  Problem Relation Age of Onset  . Asthma Mother   . Alcohol abuse Father      Past Surgical History:  Procedure Laterality Date  . CARDIAC SURGERY    . CESAREAN SECTION    . CORONARY ARTERY BYPASS GRAFT  07/06/2017   Redo-sternotomy and CABG 2 with LIMA to LAD, SVG to OM, and a a closure, mitral valve repair with #30 mm [place] ring on 07/06/2017  . ESOPHAGEAL MANOMETRY N/A 03/06/2012   Procedure: ESOPHAGEAL MANOMETRY (EM);  Surgeon: Beryle Beams, MD;  Location: [facility];  Service: Endoscopy;  Laterality: N/A;  . IR ANGIOGRAM SELECTIVE EACH ADDITIONAL VESSEL  02/16/2019  . IR ANGIOGRAM SELECTIVE EACH ADDITIONAL VESSEL  02/16/2019  . IR ANGIOGRAM SELECTIVE EACH ADDITIONAL VESSEL  02/16/2019  . IR ANGIOGRAM SELECTIVE EACH ADDITIONAL VESSEL  02/16/2019  . IR RENAL SELECTIVE  UNI INC S&I MOD SED  02/16/2019  . IR US GUIDE VASC ACCESS RIGHT  02/16/2019  . TONSILLECTOMY      Social History   Socioeconomic History  . Marital status: Legally Separated    Spouse name: Not on file  . Number of children: 4  . Years of education: 82  . Highest education level: Not on file  Occupational History  . Occupation: Retired  Tobacco Use  . Smoking status: Never Smoker  . Smokeless tobacco: Never Used  Substance and Sexual Activity  . Alcohol use: No  . Drug use: Never  . Sexual activity: Yes  Other Topics Concern  . Not on file  Social History Narrative   Fun/Hobby: Gardening    Denies abuse and feels safe at home.    1 son deceased   Social Determinants of Radio broadcast assistant Strain:   . Difficulty of Paying Living Expenses:     Food Insecurity:   . Worried About Charity fundraiser in the Last Year:   . Arboriculturist in the Last Year:   Transportation Needs:   . Film/video editor (Medical):   Marland Kitchen Lack of Transportation (Non-Medical):   Physical Activity:   . Days of Exercise per Week:   . Minutes of Exercise per Session:   Stress:   . Feeling of Stress :   Social Connections:   . Frequency of Communication with Friends and Family:   . Frequency of Social Gatherings with Friends and Family:   . Attends Religious Services:   . Active Member of Clubs or Organizations:   . Attends Archivist Meetings:   Marland Kitchen Marital Status:   Intimate Partner Violence:   . Fear of Current or Ex-Partner:   . Emotionally Abused:   Marland Kitchen Physically Abused:   . Sexually Abused:      Allergies  Allergen Reactions  . Contrast Media [Iodinated Diagnostic Agents] Anaphylaxis    Per pt she had anaphylaxis reaction to contrast media in the past. States she couldn't breathe and they had to give her epi.   . Adhesive [Tape] Itching  . Penicillins Other (See Comments)    GI upset Has patient had a PCN reaction causing immediate rash, facial/tongue/throat swelling, SOB or lightheadedness with hypotension: No Has patient had a PCN reaction causing severe rash involving mucus membranes or skin necrosis: No Has patient had a PCN reaction that required hospitalization: No Has patient had a PCN reaction occurring within the last 10 years: No If all of the above answers are "NO", then may proceed with Cephalosporin use.     Outpatient Medications Prior to Visit  Medication Sig Dispense Refill  . acetaminophen (TYLENOL) 325 MG tablet Take 2 tablets (650 mg total) by mouth every 6 (six) hours as needed for fever or headache. 30 tablet 0  . albuterol (VENTOLIN HFA) 108 (90 Base) MCG/ACT inhaler INHALE 2 PUFFS INTO THE LUNGS EVERY 6 HOURS AS NEEDED FOR WHEEZE 18 g 3  . apixaban (ELIQUIS) 5 MG TABS tablet Take 1 tablet (5 mg total)  by mouth 2 (two) times daily. 60 tablet 3  . arformoterol (BROVANA) 15  MCG/2ML NEBU Take 2 mLs (15 mcg total) by nebulization 2 (two) times daily. 120 mL 6  . Blood Glucose Monitoring Suppl (ACCU-CHEK AVIVA PLUS) w/Device KIT USE TO CHECK BLOOD SUGARS ONCE DAILY    . budesonide (PULMICORT) 0.25 MG/2ML nebulizer solution Take 2 mLs (0.25 mg total) by nebulization 2 (two) times daily. 120 mL 12  . cetirizine (ZYRTEC) 10 MG tablet Take 1 tablet (10 mg total) by mouth daily. 30 tablet 6  . diphenhydrAMINE (BENADRYL) 12.5 MG/5ML elixir Take 12.5 mg by mouth daily as needed.     . famotidine (PEPCID) 20 MG tablet Take 20 mg by mouth daily before breakfast.   3  . ferrous sulfate 325 (65 FE) MG tablet Take 1 tablet (325 mg total) by mouth 3 (three) times daily with meals. (Patient taking differently: Take 325 mg by mouth daily. ) 90 tablet 3  . fluticasone (FLONASE) 50 MCG/ACT nasal spray Place 2 sprays into both nostrils 2 (two) times daily. (Patient taking differently: Place 2 sprays into both nostrils as needed. ) 1 g 2  . furosemide (LASIX) 20 MG tablet Take 1 tablet (20 mg total) by mouth 2 (two) times daily.    Marland Kitchen glucose blood (ACCU-CHEK AVIVA PLUS) test strip USE WITH GLUCOMETER TO CHECK BLOOD SUGARS ONCE DAILY    . isosorbide mononitrate (IMDUR) 30 MG 24 hr tablet Take 1 tablet by mouth as needed.     . lidocaine (LIDODERM) 5 % Place 1 patch onto the skin daily. Remove & Discard patch within 12 hours or as directed by MD 30 patch 0  . magic mouthwash w/lidocaine SOLN Take 5 mLs by mouth 3 (three) times daily as needed for mouth pain. 75 mL 0  . metFORMIN (GLUCOPHAGE XR) 500 MG 24 hr tablet Take 1 tablet (500 mg total) by mouth daily with breakfast. (Patient taking differently: Take 500 mg by mouth 2 (two) times daily. ) 30 tablet 2  . montelukast (SINGULAIR) 10 MG tablet Take 1 tablet (10 mg total) by mouth at bedtime. (Patient taking differently: Take 10 mg by mouth as needed. ) 30 tablet 11  .  Multiple Vitamin (MULTIVITAMIN WITH MINERALS) TABS tablet Take 1 tablet by mouth daily. (Patient taking differently: Take 1 tablet by mouth. occas) 30 tablet 3  . nitroGLYCERIN (NITROSTAT) 0.4 MG SL tablet Place 0.4 mg under the tongue every 5 (five) minutes as needed for chest pain.     Marland Kitchen nystatin (MYCOSTATIN) 100000 UNIT/ML suspension Take 5 mLs (500,000 Units total) by mouth 4 (four) times daily. 60 mL 0  . pantoprazole (PROTONIX) 40 MG tablet Take 1 tablet daily before first meal 30 tablet 0  . predniSONE (DELTASONE) 10 MG tablet 2 tabs daily X5 days 10 tablet 0  . albuterol (PROVENTIL) (2.5 MG/3ML) 0.083% nebulizer solution Take 3 mLs (2.5 mg total) by nebulization every 6 (six) hours as needed for wheezing or shortness of breath. (Patient not taking: Reported on 04/25/2019) 75 mL 12  . ipratropium-albuterol (DUONEB) 0.5-2.5 (3) MG/3ML SOLN Take 3 mLs by nebulization every 4 (four) hours as needed. (Patient not taking: Reported on 04/25/2019) 360 mL 12  . azithromycin (ZITHROMAX) 250 MG tablet Take 1 tablet (250 mg total) by mouth every Monday, Wednesday, and Friday. 36 tablet 0  . azithromycin (ZITHROMAX) 250 MG tablet Take 2 tablets today, then 1 daily until gone. 6 tablet 0   No facility-administered medications prior to visit.    Review of Systems  Constitutional: Negative for chills, fever,  malaise/fatigue and weight loss.  HENT: Negative for hearing loss, sore throat and tinnitus.   Eyes: Negative for blurred vision and double vision.  Respiratory: Positive for cough and shortness of breath. Negative for hemoptysis, sputum production, wheezing and stridor.   Cardiovascular: Negative for chest pain, palpitations, orthopnea, leg swelling and PND.  Gastrointestinal: Negative for abdominal pain, constipation, diarrhea, heartburn, nausea and vomiting.  Genitourinary: Negative for dysuria, hematuria and urgency.  Musculoskeletal: Negative for joint pain and myalgias.  Skin: Negative for  itching and rash.  Neurological: Negative for dizziness, tingling, weakness and headaches.  Endo/Heme/Allergies: Negative for environmental allergies. Does not bruise/bleed easily.  Psychiatric/Behavioral: Negative for depression. The patient is not nervous/anxious and does not have insomnia.   All other systems reviewed and are negative.    Objective:  Physical Exam Vitals reviewed.  Constitutional:      General: She is not in acute distress.    Appearance: She is well-developed.  HENT:     Head: Normocephalic and atraumatic.  Eyes:     General: No scleral icterus.    Conjunctiva/sclera: Conjunctivae normal.     Pupils: Pupils are equal, round, and reactive to light.  Neck:     Vascular: No JVD.     Trachea: No tracheal deviation.  Cardiovascular:     Rate and Rhythm: Normal rate and regular rhythm.     Heart sounds: Normal heart sounds. No murmur.  Pulmonary:     Effort: Pulmonary effort is normal. No tachypnea, accessory muscle usage or respiratory distress.     Breath sounds: Normal breath sounds. No stridor. No wheezing, rhonchi or rales.  Abdominal:     General: Bowel sounds are normal. There is no distension.     Palpations: Abdomen is soft.     Tenderness: There is no abdominal tenderness.  Musculoskeletal:        General: No tenderness.     Cervical back: Neck supple.  Lymphadenopathy:     Cervical: No cervical adenopathy.  Skin:    General: Skin is warm and dry.     Capillary Refill: Capillary refill takes less than 2 seconds.     Findings: No rash.  Neurological:     Mental Status: She is alert and oriented to person, place, and time.  Psychiatric:        Behavior: Behavior normal.      Vitals:   04/25/19 1224  BP: (!) 142/78  Pulse: 65  Temp: 97.6 F (36.4 C)  TempSrc: Temporal  SpO2: 94%  Weight: 148 lb 9.6 oz (67.4 kg)  Height: 5' 2" (1.575 m)   94% on RA BMI Readings from Last 3 Encounters:  04/25/19 27.18 kg/m  03/07/19 27.98 kg/m    02/16/19 27.58 kg/m   Wt Readings from Last 3 Encounters:  04/25/19 148 lb 9.6 oz (67.4 kg)  03/07/19 153 lb (69.4 kg)  02/16/19 150 lb 12.7 oz (68.4 kg)     CBC    Component Value Date/Time   WBC 5.4 03/14/2019 0904   RBC 4.51 03/14/2019 0904   HGB 10.9 (L) 03/14/2019 0904   HCT 35.7 (L) 03/14/2019 0904   PLT 148 (L) 03/14/2019 0904   MCV 79.2 (L) 03/14/2019 0904   MCH 24.2 (L) 03/14/2019 0904   MCHC 30.5 03/14/2019 0904   RDW 16.9 (H) 03/14/2019 0904   LYMPHSABS 1.1 03/14/2019 0904   MONOABS 0.6 03/14/2019 0904   EOSABS 0.0 03/14/2019 0904   BASOSABS 0.0 03/14/2019 0904    Chest  Imaging: CXR - 01/21/2018 IMPRESSION: Stable cardiomegaly and left greater than right basilar scarring. No active disease. The patient's images have been independently reviewed by me.   Chest x-ray June and July: Consistent with prior median sternotomy history of CABG. July chest x-ray with left-sided pleural effusion.  Bibasilar atelectasis The patient's images have been independently reviewed by me.   Chest x-ray October 2020: Mild cardiomegaly, basilar scarring in the left.  Unclear to me if there is a effusion on the left side. The patient's images have been independently reviewed by me  HRCT chest 11/23/2018: Lower lobe scarring parenchymal banding.  Known previous sternotomy. Small left pleural effusion Hemorrhagic lesion within the left kidney, large 3.2 x 2.9 cm thyroid gland the lesion. The patient's images have been independently reviewed by me.    Chest x-ray 12/10/2018: Left upper lobe infiltrate The patient's images have been independently reviewed by me.       Pulmonary Functions Testing Results: PFT Results Latest Ref Rng & Units 02/08/2018  FVC-Pre L 1.46  FVC-Predicted Pre % 58  FVC-Post L 1.46  FVC-Predicted Post % 58  Pre FEV1/FVC % % 80  Post FEV1/FCV % % 82  FEV1-Pre L 1.17  FEV1-Predicted Pre % 62  FEV1-Post L 1.19  DLCO UNC% % 67  DLCO COR %Predicted  % 112  TLC L 3.41  TLC % Predicted % 76  RV % Predicted % 92    FeNO: None   Pathology: None   Echocardiogram:   Echocardiogram July 2020: Depressed ejection fraction 45 to 50%, left ventricular hypokinesis.  Elevated PA pressures  Heart Catheterization: no reports I can see    Assessment & Plan:     ICD-10-CM   1. Mucopurulent chronic bronchitis (HCC)  J41.1   2. Moderate persistent asthma, unspecified whether complicated  Y63.78   3. Cough  R05     Discussion:  This is a 74 year old female with persistent asthma symptoms with recurrent chronic bronchitis.  This is worse at nighttime with making it difficult for her to sleep.  She has had several rounds on and off of antibiotic therapy.  HRCT with no evidence of ILD.  Trying to prevent multiple courses of prednisone use for her.  Plan: We discussed risk benefits of using azithromycin Monday Wednesday Friday.  She is not interested in starting this due to the risk of hearing loss even though it is small. At this point continue use of her nebulized treatments Pulmicort plus Brovana. She does see a significant difference in this. She continues in regular use of nasal sprays plus Mucinex. Give her samples today of Delsym to take at nighttime before she goes to bed. Encourage patient not to eat meals too late at night which may worsen her reflux Continue to sleep with head of bed elevated. Return to clinic as needed.  Greater than 50% of this patient's 20-minute office visit was spent face-to-face discussing the above recommendations and treatment plan.   [name], DO [facility] 04/25/2019 12:32 PM

## 2019-05-10 ENCOUNTER — Other Ambulatory Visit: Payer: Self-pay

## 2019-05-10 ENCOUNTER — Ambulatory Visit (INDEPENDENT_AMBULATORY_CARE_PROVIDER_SITE_OTHER)
Admission: EM | Admit: 2019-05-10 | Discharge: 2019-05-10 | Disposition: A | Discharge status: Home / Self Care | Payer: Medicare Other | Source: Home / Self Care

## 2019-05-10 ENCOUNTER — Encounter (HOSPITAL_COMMUNITY): Payer: Self-pay | Admitting: Emergency Medicine

## 2019-05-10 ENCOUNTER — Emergency Department (HOSPITAL_COMMUNITY)
Admission: EM | Admit: 2019-05-10 | Discharge: 2019-05-10 | Disposition: A | Discharge status: Home / Self Care | Payer: Medicare Other | Attending: Emergency Medicine | Admitting: Emergency Medicine

## 2019-05-10 ENCOUNTER — Emergency Department (HOSPITAL_COMMUNITY): Payer: Medicare Other

## 2019-05-10 ENCOUNTER — Encounter (HOSPITAL_COMMUNITY): Payer: Self-pay

## 2019-05-10 DIAGNOSIS — T50901A Poisoning by unspecified drugs, medicaments and biological substances, accidental (unintentional), initial encounter: Secondary | ICD-10-CM | POA: Insufficient documentation

## 2019-05-10 DIAGNOSIS — Z7984 Long term (current) use of oral hypoglycemic drugs: Secondary | ICD-10-CM | POA: Diagnosis not present

## 2019-05-10 DIAGNOSIS — I4891 Unspecified atrial fibrillation: Secondary | ICD-10-CM | POA: Insufficient documentation

## 2019-05-10 DIAGNOSIS — M62838 Other muscle spasm: Secondary | ICD-10-CM

## 2019-05-10 DIAGNOSIS — I2510 Atherosclerotic heart disease of native coronary artery without angina pectoris: Secondary | ICD-10-CM | POA: Insufficient documentation

## 2019-05-10 DIAGNOSIS — Z7901 Long term (current) use of anticoagulants: Secondary | ICD-10-CM | POA: Insufficient documentation

## 2019-05-10 DIAGNOSIS — J449 Chronic obstructive pulmonary disease, unspecified: Secondary | ICD-10-CM | POA: Diagnosis not present

## 2019-05-10 DIAGNOSIS — R110 Nausea: Secondary | ICD-10-CM | POA: Diagnosis not present

## 2019-05-10 DIAGNOSIS — E119 Type 2 diabetes mellitus without complications: Secondary | ICD-10-CM | POA: Insufficient documentation

## 2019-05-10 DIAGNOSIS — I9589 Other hypotension: Secondary | ICD-10-CM | POA: Diagnosis not present

## 2019-05-10 DIAGNOSIS — Z79899 Other long term (current) drug therapy: Secondary | ICD-10-CM | POA: Diagnosis not present

## 2019-05-10 LAB — BASIC METABOLIC PANEL
Anion gap: 10 (ref 5–15)
BUN: 22 mg/dL (ref 8–23)
CO2: 23 mmol/L (ref 22–32)
Calcium: 8.8 mg/dL — ABNORMAL LOW (ref 8.9–10.3)
Chloride: 103 mmol/L (ref 98–111)
Creatinine, Ser: 1.08 mg/dL — ABNORMAL HIGH (ref 0.44–1.00)
GFR calc Af Amer: 59 mL/min — ABNORMAL LOW (ref >60)
GFR calc non Af Amer: 51 mL/min — ABNORMAL LOW (ref >60)
Glucose, Bld: 182 mg/dL — ABNORMAL HIGH (ref 70–99)
Potassium: 4.3 mmol/L (ref 3.5–5.1)
Sodium: 136 mmol/L (ref 135–145)

## 2019-05-10 LAB — CBC
HCT: 38.2 % (ref 36.0–46.0)
Hemoglobin: 11.8 g/dL — ABNORMAL LOW (ref 12.0–15.0)
MCH: 24.3 pg — ABNORMAL LOW (ref 26.0–34.0)
MCHC: 30.9 g/dL (ref 30.0–36.0)
MCV: 78.8 fL — ABNORMAL LOW (ref 80.0–100.0)
Platelets: 197 10*3/uL (ref 150–400)
RBC: 4.85 MIL/uL (ref 3.87–5.11)
RDW: 16.2 % — ABNORMAL HIGH (ref 11.5–15.5)
WBC: 7.3 10*3/uL (ref 4.0–10.5)
nRBC: 0 % (ref 0.0–0.2)

## 2019-05-10 LAB — MAGNESIUM: Magnesium: 1.7 mg/dL (ref 1.7–2.4)

## 2019-05-10 LAB — TROPONIN I (HIGH SENSITIVITY)
Troponin I (High Sensitivity): 19 ng/L — ABNORMAL HIGH (ref <18)
Troponin I (High Sensitivity): 14 ng/L (ref <18)

## 2019-05-10 MED ORDER — KETOROLAC TROMETHAMINE 30 MG/ML IJ SOLN: 30.0000 mg | Freq: Once | INTRAMUSCULAR | Status: DC

## 2019-05-10 MED ORDER — SODIUM CHLORIDE 0.9% FLUSH
3.0000 mL | Freq: Once | INTRAVENOUS | Status: AC
  Administered 2019-05-10: 16:00:00 3 mL via INTRAVENOUS

## 2019-05-10 MED ORDER — PREDNISONE 10 MG (21) PO TBPK: ORAL_TABLET | ORAL | 0 refills | Status: AC

## 2019-05-10 MED ORDER — ONDANSETRON HCL 4 MG/2ML IJ SOLN
4.0000 mg | Freq: Once | INTRAMUSCULAR | Status: AC
  Administered 2019-05-10: 17:00:00 4 mg via INTRAVENOUS
  Filled 2019-05-10: qty 2

## 2019-05-10 MED ORDER — TIZANIDINE HCL 4 MG PO TABS: 2.0000 mg | ORAL_TABLET | Freq: Every day | ORAL | 0 refills | Status: DC

## 2019-05-10 MED ORDER — SODIUM CHLORIDE 0.9 % IV BOLUS
1000.0000 mL | Freq: Once | INTRAVENOUS | Status: AC
  Administered 2019-05-10: 1000 mL via INTRAVENOUS

## 2019-05-10 MED ORDER — MAGNESIUM SULFATE 2 GM/50ML IV SOLN
2.0000 g | Freq: Once | INTRAVENOUS | Status: AC
  Administered 2019-05-10: 2 g via INTRAVENOUS
  Filled 2019-05-10: qty 50

## 2019-05-10 NOTE — ED Triage Notes (Addendum)
Pt seen at urgent care earlier for neck spasms. Given scripts for muscle relaxer. She was supposed to take 1/2 tab, took 1 full tablet and called EMS for eval of acute onset chest pain, diaphoresis, hypotension. ** counted pills and she took 2 full tabs of tizanidine when dose was supposed to be 1/2 tab.

## 2019-05-10 NOTE — ED Triage Notes (Signed)
Pt states she has been having neck spasms x 2 days. Pt states she goes to the [facility] and they give her injections for pain in her neck there.

## 2019-05-10 NOTE — ED Notes (Signed)
Pt refusing to change into hospital gown without something for her neck pain.

## 2019-05-10 NOTE — ED Provider Notes (Signed)
[place]    CSN: [id number] Arrival date & time: 05/10/19  1042      History   Chief Complaint Chief Complaint  Patient presents with  . Neck Pain    HPI Sharon Spears is a 74 y.o. female.   Patient presents urgent care for neck spasm and pain.  She reports is been getting bad over the last 2 days.  She reports she is followed by [facility] for [facility].  She reports they were unable to see her as her normal provider was in surgery.  They told her to go to the urgent care and that they could review it she receives regularly.  Patient reports she normally gets injections and steroids and this significantly improves her pain.  She denies this is any different than it usually is.  She denies any numbness tingling or weakness in her upper extremities.  She just reports pain and spasm on the right side of her neck primarily.  No headache, blurry vision or confusion.  No slurred speech.  No recent trauma.     Past Medical History:  Diagnosis Date  . Adjustment disorder with anxiety   . Asthma   . Atrial fibrillation ([place])   . Benign essential hypertension   . COPD (chronic obstructive pulmonary disease) ([place])   . Coronary artery disease   . Diabetic neuropathic arthritis ([place])   . Heart palpitations   . Hypoxia 07/19/2018  . Other cirrhosis of liver ([place]) 07/20/2018  . Uncontrolled diabetes mellitus type 2 without complications     Patient Active Problem List   Diagnosis Date Noted  . Thrush 03/07/2019  . Severe persistent asthma 02/14/2019  . Medication monitoring encounter 02/14/2019  . COPD exacerbation ([place]) 01/24/2019  . Episode of recurrent major depressive disorder ([place]) 01/24/2019  . Coronary artery disease of native artery of native heart with stable angina pectoris ([place]) 01/24/2019  . Advice given about COVID-19 virus infection 12/27/2018  . Thyroid mass 12/01/2018  . Renal mass 12/01/2018  . Abnormal findings on diagnostic imaging  of lung 12/01/2018  . Complex care coordination 12/01/2018  . Other cirrhosis of liver ([place]) 07/20/2018  . Unspecified atrial fibrillation ([place]) 07/20/2018  . Acute respiratory failure with hypoxemia ([place]) 07/19/2018  . Asthma 06/12/2018  . Cough 02/08/2018  . Shortness of breath 02/08/2018  . Acute on chronic left systolic heart failure ([place]) 09/25/2017  . Thrombocytopenia ([place]) 03/20/2015  . Type 2 diabetes mellitus with other specified complication ([place]) 53/79/4327  . HTN (hypertension) 03/20/2015  . S/P CABG (coronary artery bypass graft) 03/20/2015    Past Surgical History:  Procedure Laterality Date  . CARDIAC SURGERY    . CESAREAN SECTION    . CORONARY ARTERY BYPASS GRAFT  07/06/2017   Redo-sternotomy and CABG 2 with LIMA to LAD, SVG to OM, and a a closure, mitral valve repair with #30 mm [place] ring on 07/06/2017  . ESOPHAGEAL MANOMETRY N/A 03/06/2012   Procedure: ESOPHAGEAL MANOMETRY (EM);  Surgeon: Beryle Beams, MD;  Location: [facility];  Service: Endoscopy;  Laterality: N/A;  . IR ANGIOGRAM SELECTIVE EACH ADDITIONAL VESSEL  02/16/2019  . IR ANGIOGRAM SELECTIVE EACH ADDITIONAL VESSEL  02/16/2019  . IR ANGIOGRAM SELECTIVE EACH ADDITIONAL VESSEL  02/16/2019  . IR ANGIOGRAM SELECTIVE EACH ADDITIONAL VESSEL  02/16/2019  . IR RENAL SELECTIVE  UNI INC S&I MOD SED  02/16/2019  . IR US GUIDE VASC ACCESS RIGHT  02/16/2019  . TONSILLECTOMY  OB History   No obstetric history on file.      Home Medications    Prior to Admission medications   Medication Sig Start Date End Date Taking? Authorizing Provider  acetaminophen (TYLENOL) 325 MG tablet Take 2 tablets (650 mg total) by mouth every 6 (six) hours as needed for fever or headache. 10/31/18   Horton, Barbette Hair, MD  albuterol (PROVENTIL) (2.5 MG/3ML) 0.083% nebulizer solution Take 3 mLs (2.5 mg total) by nebulization every 6 (six) hours as needed for wheezing or shortness of breath. Patient not taking: Reported on 04/25/2019  06/12/18   Fenton Foy, NP  albuterol (VENTOLIN HFA) 108 (90 Base) MCG/ACT inhaler INHALE 2 PUFFS INTO THE LUNGS EVERY 6 HOURS AS NEEDED FOR WHEEZE 04/09/19   Lauraine Rinne, NP  apixaban (ELIQUIS) 5 MG TABS tablet Take 1 tablet (5 mg total) by mouth 2 (two) times daily. 09/28/17   Charolette Forward, MD  arformoterol (BROVANA) 15 MCG/2ML NEBU Take 2 mLs (15 mcg total) by nebulization 2 (two) times daily. 03/12/19   Icard, Octavio Graves, DO  Blood Glucose Monitoring Suppl (ACCU-CHEK AVIVA PLUS) w/Device KIT USE TO CHECK BLOOD SUGARS ONCE DAILY 10/10/18   [provider]  budesonide (PULMICORT) 0.25 MG/2ML nebulizer solution Take 2 mLs (0.25 mg total) by nebulization 2 (two) times daily. 03/07/19   Lauraine Rinne, NP  cetirizine (ZYRTEC) 10 MG tablet Take 1 tablet (10 mg total) by mouth daily. 03/07/19   Lauraine Rinne, NP  diphenhydrAMINE (BENADRYL) 12.5 MG/5ML elixir Take 12.5 mg by mouth daily as needed.  12/05/18   [provider]  famotidine (PEPCID) 20 MG tablet Take 20 mg by mouth daily before breakfast.  02/04/15   [provider]  ferrous sulfate 325 (65 FE) MG tablet Take 1 tablet (325 mg total) by mouth 3 (three) times daily with meals. Patient taking differently: Take 325 mg by mouth daily.  09/28/17   Charolette Forward, MD  fluticasone (FLONASE) 50 MCG/ACT nasal spray Place 2 sprays into both nostrils 2 (two) times daily. Patient taking differently: Place 2 sprays into both nostrils as needed.  05/04/18   Lamptey, Myrene Galas, MD  furosemide (LASIX) 20 MG tablet Take 1 tablet (20 mg total) by mouth 2 (two) times daily. 07/21/18   Samuella Cota, MD  glucose blood (ACCU-CHEK AVIVA PLUS) test strip USE WITH GLUCOMETER TO CHECK BLOOD SUGARS ONCE DAILY 10/10/18   [provider]  ipratropium-albuterol (DUONEB) 0.5-2.5 (3) MG/3ML SOLN Take 3 mLs by nebulization every 4 (four) hours as needed. Patient not taking: Reported on 04/25/2019 01/24/19   June Leap L, DO  isosorbide  mononitrate (IMDUR) 30 MG 24 hr tablet Take 1 tablet by mouth as needed.  07/12/18   [provider]  lidocaine (LIDODERM) 5 % Place 1 patch onto the skin daily. Remove & Discard patch within 12 hours or as directed by MD 03/14/19   Quintella Reichert, MD  magic mouthwash w/lidocaine SOLN Take 5 mLs by mouth 3 (three) times daily as needed for mouth pain. 12/05/18   Wieters, Hallie C, PA-C  metFORMIN (GLUCOPHAGE XR) 500 MG 24 hr tablet Take 1 tablet (500 mg total) by mouth daily with breakfast. Patient taking differently: Take 500 mg by mouth 2 (two) times daily.  06/02/16   Golden Circle, FNP  montelukast (SINGULAIR) 10 MG tablet Take 1 tablet (10 mg total) by mouth at bedtime. Patient taking differently: Take 10 mg by mouth as needed.  06/12/18  Fenton Foy, NP  Multiple Vitamin (MULTIVITAMIN WITH MINERALS) TABS tablet Take 1 tablet by mouth daily. Patient taking differently: Take 1 tablet by mouth. occas 09/28/17   Charolette Forward, MD  nitroGLYCERIN (NITROSTAT) 0.4 MG SL tablet Place 0.4 mg under the tongue every 5 (five) minutes as needed for chest pain.     [provider]  nystatin (MYCOSTATIN) 100000 UNIT/ML suspension Take 5 mLs (500,000 Units total) by mouth 4 (four) times daily. 03/07/19   Lauraine Rinne, NP  pantoprazole (PROTONIX) 40 MG tablet Take 1 tablet daily before first meal 01/08/19   Tanda Rockers, MD  predniSONE (STERAPRED UNI-PAK 21 TAB) 10 MG (21) TBPK tablet Take 6 tablets (60 mg total) by mouth daily for 1 day, THEN 5 tablets (50 mg total) daily for 1 day, THEN 4 tablets (40 mg total) daily for 1 day, THEN 3 tablets (30 mg total) daily for 1 day, THEN 2 tablets (20 mg total) daily for 1 day, THEN 1 tablet (10 mg total) daily for 1 day. 05/10/19 05/16/19  Akshaj Besancon, Marguerita Beards, PA-C  tiZANidine (ZANAFLEX) 4 MG tablet Take 0.5 tablets (2 mg total) by mouth at bedtime. 05/10/19   Jailee Jaquez, Marguerita Beards, PA-C    Family History Family History  Problem Relation Age of Onset  . Asthma  Mother   . Alcohol abuse Father     Social History Social History   Tobacco Use  . Smoking status: Never Smoker  . Smokeless tobacco: Never Used  Substance Use Topics  . Alcohol use: No  . Drug use: Never     Allergies   Contrast media [iodinated diagnostic agents], Adhesive [tape], and Penicillins   Review of Systems Review of Systems   Physical Exam Triage Vital Signs ED Triage Vitals  Enc Vitals Group     BP 05/10/19 1102 (!) 177/68     Pulse Rate 05/10/19 1102 79     Resp 05/10/19 1102 16     Temp 05/10/19 1102 99.3 F (37.4 C)     Temp src --      SpO2 05/10/19 1102 97 %     Weight 05/10/19 1101 146 lb 9.6 oz (66.5 kg)     Height --      Head Circumference --      Peak Flow --      Pain Score 05/10/19 1101 9     Pain Loc --      Pain Edu? --      Excl. in [place]? --    No data found.  Updated Vital Signs BP (!) 177/68 (BP Location: Right Arm)   Pulse 79   Temp 99.3 F (37.4 C)   Resp 16   Wt 146 lb 9.6 oz (66.5 kg)   SpO2 97%   BMI 26.81 kg/m   Visual Acuity Right Eye Distance:   Left Eye Distance:   Bilateral Distance:    Right Eye Near:   Left Eye Near:    Bilateral Near:     Physical Exam Vitals and nursing note reviewed.  Constitutional:      General: She is in acute distress.     Appearance: She is well-developed.     Comments: Patient holding ice pack to right side of neck.  HENT:     Head: Normocephalic and atraumatic.  Eyes:     Extraocular Movements: Extraocular movements intact.     Conjunctiva/sclera: Conjunctivae normal.     Pupils: Pupils are equal, round, and reactive  to light.  Neck:     Comments: Patient limited range of motion of the neck due to pain.  Some spasm evident on the right side and tender to palpation. Cardiovascular:     Rate and Rhythm: Normal rate.  Pulmonary:     Effort: Pulmonary effort is normal. No respiratory distress.  Musculoskeletal:     Cervical back: Neck supple. Tenderness present.   Skin:    General: Skin is warm and dry.  Neurological:     General: No focal deficit present.     Mental Status: She is alert and oriented to person, place, and time.     Cranial Nerves: No cranial nerve deficit.     Sensory: No sensory deficit.     Motor: No weakness.      UC Treatments / Results  Labs (all labs ordered are listed, but only abnormal results are displayed) Labs Reviewed - No data to display  EKG   Radiology No results found.  Procedures Procedures (including critical care time)  Medications Ordered in UC Medications - No data to display  Initial Impression / Assessment and Plan / UC Course  I have reviewed the triage vital signs and the nursing notes.  Pertinent labs & imaging results that were available during my care of the patient were reviewed by me and considered in my medical decision making (see chart for details).     #Muscle spasm Patient is a 75 year old female presenting with acute neck pain.  She has a history of issues with her neck and is followed by [facility].  She is neurologically well without any alarm signs at this time.   we are unable to review her records to see exactly what she has been treated with before.  Patient is on  Eliquis and we discussed the risk benefit of Toradol injection, and she feels she would like to avoid this today.  We will start her on prednisone and a small dose of muscle relaxer.  She is to continue Tylenol.  Supplied information about the [facility] walk-in hours and instructed her to report there if not having significant improvement in her pain, and they could best manage her as they have access to all of her records and previous treatments.  Patient agrees with this plan and verbalized understanding.   Final Clinical Impressions(s) / UC Diagnoses   Final diagnoses:  Muscle spasms of neck     Discharge Instructions     Take the prednisone as directed Take 1/2  tablet of the muscle relaxer in the evenings.  Do not drive or drink alcohol after taking this as it will make you sleepy  Schedule follow-up with your orthopedist who handles your neck pain regularly.  After Hours [facility] [place]        [phone]  [facility] building where the [facility] [place] is located. [facility] [id number],  [address]  EVENINGS & WEEKENDSNO APPOINTMENT NECESSARY Mon-Fri  5:30PM - 9PM Sat 9 AM - 2 PM Sun 10 AM - 2 PM    ED Prescriptions    Medication Sig Dispense Auth. Provider   predniSONE (STERAPRED UNI-PAK 21 TAB) 10 MG (21) TBPK tablet Take 6 tablets (60 mg total) by mouth daily for 1 day, THEN 5 tablets (50 mg total) daily for 1 day, THEN 4 tablets (40 mg total) daily for 1 day, THEN 3 tablets (30 mg total) daily for 1 day, THEN 2  tablets (20 mg total) daily for 1 day, THEN 1 tablet (10 mg total) daily for 1 day. 21 tablet Marquelle Balow, Marguerita Beards, PA-C   tiZANidine (ZANAFLEX) 4 MG tablet Take 0.5 tablets (2 mg total) by mouth at bedtime. 15 tablet Masai Kidd, Marguerita Beards, PA-C     PDMP not reviewed this encounter.   Purnell Shoemaker, PA-C 05/10/19 1629

## 2019-05-10 NOTE — ED Notes (Signed)
Pt ambulated to bathroom with standby assist. Pt was steady on feet.

## 2019-05-10 NOTE — ED Notes (Signed)
Pt requesting oxygen, O2 reading 96% on RA. 0.5 L/min started [place] for comfort

## 2019-05-10 NOTE — Discharge Instructions (Signed)
Take the prednisone as directed Take 1/2 tablet of the muscle relaxer in the evenings.  Do not drive or drink alcohol after taking this as it will make you sleepy  Schedule follow-up with your orthopedist who handles your neck pain regularly.  After Hours [facility]In [facility] [place]        [phone]  [facility] building where [facility] [place] is located. [facility] [id number], [place] [address]  EVENINGS & WEEKENDSNO APPOINTMENT NECESSARY Mon-Fri  5:30PM - 9PM Sat 9 AM - 2 PM Sun 10 AM - 2 PM

## 2019-05-10 NOTE — ED Provider Notes (Addendum)
[facility] EMERGENCY DEPARTMENT Provider Note   CSN: [id number] Arrival date & time: 05/10/19  1453   History Chief Complaint  Patient presents with   Drug Overdose    Sharon Spears is a 74 y.o. female.  The history is provided by the patient.  Drug Overdose  She has history of diabetes, hypertension, atrial fibrillation, coronary artery disease and comes in after accidentally taking more tizanidine than she was supposed to.  She was seen at urgent care earlier today and prescribed tizanidine 4 mg tablets with instructions to take half tablet.  She inadvertently took 2tablets.  She is not complaining of drowsiness but is having slight nausea.  She was noted to be hypotensive at triage, but repeat blood pressure was noted to be normal.  Her only complaint now is that she has phlegm in the back of her throat that she is having difficulty getting up.  Past Medical History:  Diagnosis Date   Adjustment disorder with anxiety    Asthma    Atrial fibrillation ([place])    Benign essential hypertension    COPD (chronic obstructive pulmonary disease) (HCC)    Coronary artery disease    Diabetic neuropathic arthritis ([place])    Heart palpitations    Hypoxia 07/19/2018   Other cirrhosis of liver ([place]) 07/20/2018   Uncontrolled diabetes mellitus type 2 without complications     Patient Active Problem List   Diagnosis Date Noted   Thrush 03/07/2019   Severe persistent asthma 02/14/2019   Medication monitoring encounter 02/14/2019   COPD exacerbation ([place]) 01/24/2019   Episode of recurrent major depressive disorder ([place]) 01/24/2019   Coronary artery disease of native artery of native heart with stable angina pectoris ([place]) 01/24/2019   Advice given about COVID-19 virus infection 12/27/2018   Thyroid mass 12/01/2018   Renal mass 12/01/2018   Abnormal findings on diagnostic imaging of lung 12/01/2018   Complex care coordination 12/01/2018   Other  cirrhosis of liver ([place]) 07/20/2018   Unspecified atrial fibrillation ([place]) 07/20/2018   Acute respiratory failure with hypoxemia ([place]) 07/19/2018   Asthma 06/12/2018   Cough 02/08/2018   Shortness of breath 02/08/2018   Acute on chronic left systolic heart failure ([place]) 09/25/2017   Thrombocytopenia ([place]) 03/20/2015   Type 2 diabetes mellitus with other specified complication ([place]) 97/41/6384   HTN (hypertension) 03/20/2015   S/P CABG (coronary artery bypass graft) 03/20/2015    Past Surgical History:  Procedure Laterality Date   [place] GRAFT  07/06/2017   Redo-sternotomy and CABG 2 with LIMA to LAD, SVG to OM, and a a closure, mitral valve repair with #30 mm [place] ring on 07/06/2017   ESOPHAGEAL MANOMETRY N/A 03/06/2012   Procedure: ESOPHAGEAL MANOMETRY (EM);  Surgeon: Beryle Beams, MD;  Location: [facility] ENDOSCOPY;  Service: Endoscopy;  Laterality: N/A;   IR ANGIOGRAM SELECTIVE EACH ADDITIONAL VESSEL  02/16/2019   IR ANGIOGRAM SELECTIVE EACH ADDITIONAL VESSEL  02/16/2019   IR ANGIOGRAM SELECTIVE EACH ADDITIONAL VESSEL  02/16/2019   IR ANGIOGRAM SELECTIVE EACH ADDITIONAL VESSEL  02/16/2019   IR RENAL SELECTIVE  UNI INC S&I MOD SED  02/16/2019   IR US GUIDE VASC ACCESS RIGHT  02/16/2019   TONSILLECTOMY       OB History   No obstetric history on file.     Family History  Problem Relation Age of Onset   Asthma Mother    Alcohol abuse  Father     Social History   Tobacco Use   Smoking status: Never Smoker   Smokeless tobacco: Never Used  Substance Use Topics   Alcohol use: No   Drug use: Never    Home Medications Prior to Admission medications   Medication Sig Start Date End Date Taking? Authorizing Provider  acetaminophen (TYLENOL) 325 MG tablet Take 2 tablets (650 mg total) by mouth every 6 (six) hours as needed for fever or headache. 10/31/18   Horton, Barbette Hair, MD  albuterol (PROVENTIL)  (2.5 MG/3ML) 0.083% nebulizer solution Take 3 mLs (2.5 mg total) by nebulization every 6 (six) hours as needed for wheezing or shortness of breath. Patient not taking: Reported on 04/25/2019 06/12/18   Fenton Foy, NP  albuterol (VENTOLIN HFA) 108 (90 Base) MCG/ACT inhaler INHALE 2 PUFFS INTO THE LUNGS EVERY 6 HOURS AS NEEDED FOR WHEEZE 04/09/19   Lauraine Rinne, NP  apixaban (ELIQUIS) 5 MG TABS tablet Take 1 tablet (5 mg total) by mouth 2 (two) times daily. 09/28/17   Charolette Forward, MD  arformoterol (BROVANA) 15 MCG/2ML NEBU Take 2 mLs (15 mcg total) by nebulization 2 (two) times daily. 03/12/19   Icard, Octavio Graves, DO  Blood Glucose Monitoring Suppl (ACCU-CHEK AVIVA PLUS) w/Device KIT USE TO CHECK BLOOD SUGARS ONCE DAILY 10/10/18   [provider]  budesonide (PULMICORT) 0.25 MG/2ML nebulizer solution Take 2 mLs (0.25 mg total) by nebulization 2 (two) times daily. 03/07/19   Lauraine Rinne, NP  cetirizine (ZYRTEC) 10 MG tablet Take 1 tablet (10 mg total) by mouth daily. 03/07/19   Lauraine Rinne, NP  diphenhydrAMINE (BENADRYL) 12.5 MG/5ML elixir Take 12.5 mg by mouth daily as needed.  12/05/18   [provider]  famotidine (PEPCID) 20 MG tablet Take 20 mg by mouth daily before breakfast.  02/04/15   [provider]  ferrous sulfate 325 (65 FE) MG tablet Take 1 tablet (325 mg total) by mouth 3 (three) times daily with meals. Patient taking differently: Take 325 mg by mouth daily.  09/28/17   Charolette Forward, MD  fluticasone (FLONASE) 50 MCG/ACT nasal spray Place 2 sprays into both nostrils 2 (two) times daily. Patient taking differently: Place 2 sprays into both nostrils as needed.  05/04/18   Lamptey, Myrene Galas, MD  furosemide (LASIX) 20 MG tablet Take 1 tablet (20 mg total) by mouth 2 (two) times daily. 07/21/18   Samuella Cota, MD  glucose blood (ACCU-CHEK AVIVA PLUS) test strip USE WITH GLUCOMETER TO CHECK BLOOD SUGARS ONCE DAILY 10/10/18   [provider]    ipratropium-albuterol (DUONEB) 0.5-2.5 (3) MG/3ML SOLN Take 3 mLs by nebulization every 4 (four) hours as needed. Patient not taking: Reported on 04/25/2019 01/24/19   June Leap L, DO  isosorbide mononitrate (IMDUR) 30 MG 24 hr tablet Take 1 tablet by mouth as needed.  07/12/18   [provider]  lidocaine (LIDODERM) 5 % Place 1 patch onto the skin daily. Remove & Discard patch within 12 hours or as directed by MD 03/14/19   Quintella Reichert, MD  magic mouthwash w/lidocaine SOLN Take 5 mLs by mouth 3 (three) times daily as needed for mouth pain. 12/05/18   Wieters, Hallie C, PA-C  metFORMIN (GLUCOPHAGE XR) 500 MG 24 hr tablet Take 1 tablet (500 mg total) by mouth daily with breakfast. Patient taking differently: Take 500 mg by mouth 2 (two) times daily.  06/02/16   [place] Circle, FNP  montelukast (SINGULAIR) 10  MG tablet Take 1 tablet (10 mg total) by mouth at bedtime. Patient taking differently: Take 10 mg by mouth as needed.  06/12/18   Fenton Foy, NP  Multiple Vitamin (MULTIVITAMIN WITH MINERALS) TABS tablet Take 1 tablet by mouth daily. Patient taking differently: Take 1 tablet by mouth. occas 09/28/17   Charolette Forward, MD  nitroGLYCERIN (NITROSTAT) 0.4 MG SL tablet Place 0.4 mg under the tongue every 5 (five) minutes as needed for chest pain.     [provider]  nystatin (MYCOSTATIN) 100000 UNIT/ML suspension Take 5 mLs (500,000 Units total) by mouth 4 (four) times daily. 03/07/19   Lauraine Rinne, NP  pantoprazole (PROTONIX) 40 MG tablet Take 1 tablet daily before first meal 01/08/19   Tanda Rockers, MD  predniSONE (STERAPRED UNI-PAK 21 TAB) 10 MG (21) TBPK tablet Take 6 tablets (60 mg total) by mouth daily for 1 day, THEN 5 tablets (50 mg total) daily for 1 day, THEN 4 tablets (40 mg total) daily for 1 day, THEN 3 tablets (30 mg total) daily for 1 day, THEN 2 tablets (20 mg total) daily for 1 day, THEN 1 tablet (10 mg total) daily for 1 day. 05/10/19 05/16/19  Darr, Marguerita Beards, PA-C  tiZANidine (ZANAFLEX) 4 MG tablet Take 0.5 tablets (2 mg total) by mouth at bedtime. 05/10/19   Darr, Marguerita Beards, PA-C    Allergies    Contrast media [iodinated diagnostic agents], Adhesive [tape], and Penicillins  Review of Systems   Review of Systems  All other systems reviewed and are negative.   Physical Exam Updated Vital Signs BP (!) 75/32 (BP Location: Right Arm)    Pulse (!) 47    Temp 99.1 F (37.3 C) (Oral)    Resp 12    SpO2 93%   Physical Exam Vitals and nursing note reviewed.   74 year old female, resting comfortably and in no acute distress. Vital signs are significant for slow heart rate and low blood pressure. Oxygen saturation is 93%, which is normal. Head is normocephalic and atraumatic. PERRLA, EOMI. Oropharynx is clear. Neck is nontender and supple without adenopathy or JVD. Back is nontender and there is no CVA tenderness. Lungs are clear without rales, wheezes, or rhonchi. Chest is nontender. Heart has regular rate and rhythm without murmur. Abdomen is soft, flat, nontender without masses or hepatosplenomegaly and peristalsis is normoactive. Extremities have no cyanosis or edema, full range of motion is present. Skin is warm and dry without rash. Neurologic: Mental status is normal, cranial nerves are intact, there are no motor or sensory deficits.  ED Results / Procedures / Treatments   Labs (all labs ordered are listed, but only abnormal results are displayed) Labs Reviewed  BASIC METABOLIC PANEL - Abnormal; Notable for the following components:      Result Value   Glucose, Bld 182 (*)    Creatinine, Ser 1.08 (*)    Calcium 8.8 (*)    GFR calc non Af Amer 51 (*)    GFR calc Af Amer 59 (*)    All other components within normal limits  CBC - Abnormal; Notable for the following components:   Hemoglobin 11.8 (*)    MCV 78.8 (*)    MCH 24.3 (*)    RDW 16.2 (*)    All other components within normal limits  TROPONIN I (HIGH SENSITIVITY) -  Abnormal; Notable for the following components:   Troponin I (High Sensitivity) 19 (*)    All other  components within normal limits  MAGNESIUM  TROPONIN I (HIGH SENSITIVITY)    EKG EKG Interpretation  Date/Time:  Thursday May 10 2019 14:58:11 EDT Ventricular Rate:  56 PR Interval:    QRS Duration: 86 QT Interval:  498 QTC Calculation: 480 R Axis:   80 Text Interpretation: Atrial fibrillation with slow ventricular response ST & T wave abnormality, consider anterior ischemia Prolonged QT Abnormal ECG When compared with ECG of 10/30/2018, Premature ventricular complexes are no longer present QT has lengthened Confirmed by Delora Fuel ([id number]) on 05/10/2019 3:44:11 PM   Radiology DG Chest 2 View  Result Date: 05/10/2019 CLINICAL DATA:  Chest pain EXAM: CHEST - 2 VIEW COMPARISON:  03/14/2019, 02/16/2019 FINDINGS: Post sternotomy changes. Cardiomegaly with small left pleural effusion or thickening. Linear atelectasis or scarring at both bases. Mild central vascular congestion. No pneumothorax. IMPRESSION: 1. Cardiomegaly with mild central congestion 2. Small left chronic pleural effusion or thickening with linear scarring or atelectasis at the bases Electronically Signed   By: Donavan Foil M.D.   On: 05/10/2019 18:02    Procedures Procedures  CRITICAL CARE Performed by: Delora Fuel Total critical care time: 40 minutes Critical care time was exclusive of separately billable procedures and treating other patients. Critical care was necessary to treat or prevent imminent or life-threatening deterioration. Critical care was time spent personally by me on the following activities: development of treatment plan with patient and/or surrogate as well as nursing, discussions with consultants, evaluation of patient's response to treatment, examination of patient, obtaining history from patient or surrogate, ordering and performing treatments and interventions, ordering and review of laboratory  studies, ordering and review of radiographic studies, pulse oximetry and re-evaluation of patient's condition.  Medications Ordered in ED Medications  sodium chloride flush (NS) 0.9 % injection 3 mL (3 mLs Intravenous Given 05/10/19 1552)  sodium chloride 0.9 % bolus 1,000 mL (0 mLs Intravenous Stopped 05/10/19 1818)  ondansetron (ZOFRAN) injection 4 mg (4 mg Intravenous Given 05/10/19 1714)  magnesium sulfate IVPB 2 g 50 mL (0 g Intravenous Stopped 05/10/19 1939)    ED Course  I have reviewed the triage vital signs and the nursing notes.  Pertinent labs & imaging results that were available during my care of the patient were reviewed by me and considered in my medical decision making (see chart for details).  MDM Rules/Calculators/A&P Accidental overdose of tizanidine.  Low blood pressure noted at triage, but came up on being brought back to a room.  ECG shows borderline prolonged QTc interval.  Will discuss with poison control.  In the meantime, she will be given IV fluids.  Discussed with poison control who recommends observation for 6 hours following dose.  Her she took the medication at 1 PM.  If she is hemodynamically stable at that point, she would be safe for discharge.  6:22 PM Repeat ECG shows slight increase in QTC compared with earlier ECG. Magnesium level is borderline low and she will be given IV magnesium.  7:51 PM Orthostatic vital signs showed normal blood pressures and no drop with standing.  Initial troponin was minimally elevated and has come down with observation and does not need further evaluation.  She is felt to be safe for discharge.  Final Clinical Impression(s) / ED Diagnoses Final diagnoses:  Overdose, drug, accidental or unintentional, initial encounter  Other specified hypotension    Rx / DC Orders ED Discharge Orders    None       Delora Fuel, MD 33/35/45 [phone]  Delora Fuel, MD 60/47/99 [phone]

## 2019-05-10 NOTE — Discharge Instructions (Signed)
Resume taking the medication prescribed for you. Remember to only take half of a tizanidine tablet for each dose.

## 2019-05-15 ENCOUNTER — Other Ambulatory Visit: Payer: Self-pay

## 2019-05-15 ENCOUNTER — Encounter (HOSPITAL_COMMUNITY): Payer: Self-pay | Admitting: Emergency Medicine

## 2019-05-15 ENCOUNTER — Emergency Department (HOSPITAL_COMMUNITY)
Admission: EM | Admit: 2019-05-15 | Discharge: 2019-05-15 | Disposition: A | Discharge status: Home / Self Care | Payer: Medicare Other | Attending: Emergency Medicine | Admitting: Emergency Medicine

## 2019-05-15 DIAGNOSIS — M542 Cervicalgia: Secondary | ICD-10-CM | POA: Diagnosis not present

## 2019-05-15 DIAGNOSIS — R002 Palpitations: Secondary | ICD-10-CM | POA: Diagnosis present

## 2019-05-15 DIAGNOSIS — Z7984 Long term (current) use of oral hypoglycemic drugs: Secondary | ICD-10-CM | POA: Insufficient documentation

## 2019-05-15 DIAGNOSIS — I10 Essential (primary) hypertension: Secondary | ICD-10-CM

## 2019-05-15 DIAGNOSIS — R079 Chest pain, unspecified: Secondary | ICD-10-CM | POA: Diagnosis not present

## 2019-05-15 DIAGNOSIS — I2510 Atherosclerotic heart disease of native coronary artery without angina pectoris: Secondary | ICD-10-CM | POA: Diagnosis not present

## 2019-05-15 DIAGNOSIS — J45909 Unspecified asthma, uncomplicated: Secondary | ICD-10-CM | POA: Insufficient documentation

## 2019-05-15 DIAGNOSIS — I5022 Chronic systolic (congestive) heart failure: Secondary | ICD-10-CM | POA: Diagnosis not present

## 2019-05-15 DIAGNOSIS — E119 Type 2 diabetes mellitus without complications: Secondary | ICD-10-CM | POA: Diagnosis not present

## 2019-05-15 DIAGNOSIS — Z79899 Other long term (current) drug therapy: Secondary | ICD-10-CM | POA: Diagnosis not present

## 2019-05-15 DIAGNOSIS — Z7901 Long term (current) use of anticoagulants: Secondary | ICD-10-CM | POA: Diagnosis not present

## 2019-05-15 DIAGNOSIS — I110 Hypertensive heart disease with heart failure: Secondary | ICD-10-CM | POA: Insufficient documentation

## 2019-05-15 DIAGNOSIS — Z951 Presence of aortocoronary bypass graft: Secondary | ICD-10-CM | POA: Diagnosis not present

## 2019-05-15 LAB — CBC
HCT: 41.3 % (ref 36.0–46.0)
Hemoglobin: 12.7 g/dL (ref 12.0–15.0)
MCH: 24.2 pg — ABNORMAL LOW (ref 26.0–34.0)
MCHC: 30.8 g/dL (ref 30.0–36.0)
MCV: 78.8 fL — ABNORMAL LOW (ref 80.0–100.0)
Platelets: 245 10*3/uL (ref 150–400)
RBC: 5.24 MIL/uL — ABNORMAL HIGH (ref 3.87–5.11)
RDW: 16.1 % — ABNORMAL HIGH (ref 11.5–15.5)
WBC: 9.1 10*3/uL (ref 4.0–10.5)
nRBC: 0 % (ref 0.0–0.2)

## 2019-05-15 LAB — BASIC METABOLIC PANEL
Anion gap: 14 (ref 5–15)
BUN: 28 mg/dL — ABNORMAL HIGH (ref 8–23)
CO2: 25 mmol/L (ref 22–32)
Calcium: 9.5 mg/dL (ref 8.9–10.3)
Chloride: 100 mmol/L (ref 98–111)
Creatinine, Ser: 0.92 mg/dL (ref 0.44–1.00)
GFR calc Af Amer: >60 mL/min (ref >60)
GFR calc non Af Amer: >60 mL/min (ref >60)
Glucose, Bld: 124 mg/dL — ABNORMAL HIGH (ref 70–99)
Potassium: 4 mmol/L (ref 3.5–5.1)
Sodium: 139 mmol/L (ref 135–145)

## 2019-05-15 LAB — TROPONIN I (HIGH SENSITIVITY)
Troponin I (High Sensitivity): 17 ng/L (ref <18)
Troponin I (High Sensitivity): 18 ng/L — ABNORMAL HIGH (ref <18)

## 2019-05-15 LAB — CBG MONITORING, ED: Glucose-Capillary: 127 mg/dL — ABNORMAL HIGH (ref 70–99)

## 2019-05-15 MED ORDER — METHOCARBAMOL 500 MG PO TABS
500.0000 mg | ORAL_TABLET | Freq: Three times a day (TID) | ORAL | 0 refills | Status: DC | PRN
Start: 2019-05-15 — End: 2019-05-16

## 2019-05-15 MED ORDER — SODIUM CHLORIDE 0.9% FLUSH: 3.0000 mL | Freq: Once | INTRAVENOUS | Status: DC

## 2019-05-15 NOTE — ED Provider Notes (Signed)
[facility] EMERGENCY DEPARTMENT Provider Note   CSN: [id number] Arrival date & time: 05/15/19  1134     History Chief Complaint  Patient presents with  . Hypertension  . Hyperglycemia  . Palpitations    Sharon Spears is a 74 y.o. female.  HPI Patient with history of cardiac disease and atrial fibrillation.  States that she has palpitations and will feel her heart race at times.  States she was seen in the ER last week for similar symptoms.  Also complains of neck pain.  States she was given shots that medicine that make your blood pressure well.  Reviewing records appears that she was seen in the ER After an urgent care visit.  Took too much Zanaflex and had hypotension.  States she is no longer taking the muscle relaxer.  States she will occasionally get chest pain at night but has not had any during the day.  Not decrease in her exertion.  No swelling in her legs.  States she is seen Dr. Einar Gip in the past but also that he would not see her when she tried to go there.    Past Medical History:  Diagnosis Date  . Adjustment disorder with anxiety   . Asthma   . Atrial fibrillation ([place])   . Benign essential hypertension   . COPD (chronic obstructive pulmonary disease) ([place])   . Coronary artery disease   . Diabetic neuropathic arthritis ([place])   . Heart palpitations   . Hypoxia 07/19/2018  . Other cirrhosis of liver ([place]) 07/20/2018  . Uncontrolled diabetes mellitus type 2 without complications     Patient Active Problem List   Diagnosis Date Noted  . Thrush 03/07/2019  . Severe persistent asthma 02/14/2019  . Medication monitoring encounter 02/14/2019  . COPD exacerbation ([place]) 01/24/2019  . Episode of recurrent major depressive disorder ([place]) 01/24/2019  . Coronary artery disease of native artery of native heart with stable angina pectoris ([place]) 01/24/2019  . Advice given about COVID-19 virus infection 12/27/2018  . Thyroid mass 12/01/2018  . Renal mass  12/01/2018  . Abnormal findings on diagnostic imaging of lung 12/01/2018  . Complex care coordination 12/01/2018  . Other cirrhosis of liver ([place]) 07/20/2018  . Unspecified atrial fibrillation ([place]) 07/20/2018  . Acute respiratory failure with hypoxemia ([place]) 07/19/2018  . Asthma 06/12/2018  . Cough 02/08/2018  . Shortness of breath 02/08/2018  . Acute on chronic left systolic heart failure ([place]) 09/25/2017  . Thrombocytopenia ([place]) 03/20/2015  . Type 2 diabetes mellitus with other specified complication ([place]) 56/81/2751  . HTN (hypertension) 03/20/2015  . S/P CABG (coronary artery bypass graft) 03/20/2015    Past Surgical History:  Procedure Laterality Date  . CARDIAC SURGERY    . CESAREAN SECTION    . CORONARY ARTERY BYPASS GRAFT  07/06/2017   Redo-sternotomy and CABG 2 with LIMA to LAD, SVG to OM, and a a closure, mitral valve repair with #30 mm [place] ring on 07/06/2017  . ESOPHAGEAL MANOMETRY N/A 03/06/2012   Procedure: ESOPHAGEAL MANOMETRY (EM);  Surgeon: Beryle Beams, MD;  Location: [facility] END[facility]COPY;  Service: Endoscopy;  Laterality: N/A;  . IR ANGIOGRAM SELECTIVE EACH ADDITIONAL VESSEL  02/16/2019  . IR ANGIOGRAM SELECTIVE EACH ADDITIONAL VESSEL  02/16/2019  . IR ANGIOGRAM SELECTIVE EACH ADDITIONAL VESSEL  02/16/2019  . IR ANGIOGRAM SELECTIVE EACH ADDITIONAL VESSEL  02/16/2019  . IR RENAL SELECTIVE  UNI INC S&I MOD SED  02/16/2019  . IR US GUIDE VASC ACCESS RIGHT  02/16/2019  . TONSILLECTOMY       OB History   No obstetric history on file.     Family History  Problem Relation Age of Onset  . Asthma Mother   . Alcohol abuse Father     Social History   Tobacco Use  . Smoking status: Never Smoker  . Smokeless tobacco: Never Used  Substance Use Topics  . Alcohol use: No  . Drug use: Never    Home Medications Prior to Admission medications   Medication Sig Start Date End Date Taking? Authorizing Provider  acetaminophen (TYLENOL) 325 MG tablet Take 2 tablets (650 mg  total) by mouth every 6 (six) hours as needed for fever or headache. 10/31/18   Horton, Barbette Hair, MD  albuterol (PROVENTIL) (2.5 MG/3ML) 0.083% nebulizer solution Take 3 mLs (2.5 mg total) by nebulization every 6 (six) hours as needed for wheezing or shortness of breath. Patient not taking: Reported on 04/25/2019 06/12/18   Fenton Foy, NP  albuterol (VENTOLIN HFA) 108 (90 Base) MCG/ACT inhaler INHALE 2 PUFFS INTO THE LUNGS EVERY 6 HOURS AS NEEDED FOR WHEEZE 04/09/19   Lauraine Rinne, NP  apixaban (ELIQUIS) 5 MG TABS tablet Take 1 tablet (5 mg total) by mouth 2 (two) times daily. 09/28/17   Charolette Forward, MD  arformoterol (BROVANA) 15 MCG/2ML NEBU Take 2 mLs (15 mcg total) by nebulization 2 (two) times daily. 03/12/19   Icard, Octavio Graves, DO  Blood Glucose Monitoring Suppl (ACCU-CHEK AVIVA PLUS) w/Device KIT USE TO CHECK BLOOD SUGARS ONCE DAILY 10/10/18   [provider]  budesonide (PULMICORT) 0.25 MG/2ML nebulizer solution Take 2 mLs (0.25 mg total) by nebulization 2 (two) times daily. 03/07/19   Lauraine Rinne, NP  cetirizine (ZYRTEC) 10 MG tablet Take 1 tablet (10 mg total) by mouth daily. 03/07/19   Lauraine Rinne, NP  diphenhydrAMINE (BENADRYL) 12.5 MG/5ML elixir Take 12.5 mg by mouth daily as needed.  12/05/18   [provider]  famotidine (PEPCID) 20 MG tablet Take 20 mg by mouth daily before breakfast.  02/04/15   [provider]  ferrous sulfate 325 (65 FE) MG tablet Take 1 tablet (325 mg total) by mouth 3 (three) times daily with meals. Patient taking differently: Take 325 mg by mouth daily.  09/28/17   Charolette Forward, MD  fluticasone (FLONASE) 50 MCG/ACT nasal spray Place 2 sprays into both nostrils 2 (two) times daily. Patient taking differently: Place 2 sprays into both nostrils as needed.  05/04/18   Lamptey, Myrene Galas, MD  furosemide (LASIX) 20 MG tablet Take 1 tablet (20 mg total) by mouth 2 (two) times daily. 07/21/18   Samuella Cota, MD  glucose blood  (ACCU-CHEK AVIVA PLUS) test strip USE WITH GLUCOMETER TO CHECK BLOOD SUGARS ONCE DAILY 10/10/18   [provider]  ipratropium-albuterol (DUONEB) 0.5-2.5 (3) MG/3ML SOLN Take 3 mLs by nebulization every 4 (four) hours as needed. Patient not taking: Reported on 04/25/2019 01/24/19   June Leap L, DO  isosorbide mononitrate (IMDUR) 30 MG 24 hr tablet Take 1 tablet by mouth as needed.  07/12/18   [provider]  lidocaine (LIDODERM) 5 % Place 1 patch onto the skin daily. Remove & Discard patch within 12 hours or as directed by MD 03/14/19   Quintella Reichert, MD  magic mouthwash w/lidocaine SOLN Take 5 mLs by mouth 3 (three) times daily as needed for mouth pain. 12/05/18   Wieters, Hallie C, PA-C  metFORMIN (GLUCOPHAGE XR) 500  MG 24 hr tablet Take 1 tablet (500 mg total) by mouth daily with breakfast. Patient taking differently: Take 500 mg by mouth 2 (two) times daily.  06/02/16   Golden Circle, FNP  methocarbamol (ROBAXIN) 500 MG tablet Take 1 tablet (500 mg total) by mouth every 8 (eight) hours as needed for muscle spasms. 05/15/19   Davonna Belling, MD  montelukast (SINGULAIR) 10 MG tablet Take 1 tablet (10 mg total) by mouth at bedtime. Patient taking differently: Take 10 mg by mouth as needed.  06/12/18   Fenton Foy, NP  Multiple Vitamin (MULTIVITAMIN WITH MINERALS) TABS tablet Take 1 tablet by mouth daily. Patient taking differently: Take 1 tablet by mouth. occas 09/28/17   Charolette Forward, MD  nitroGLYCERIN (NITROSTAT) 0.4 MG SL tablet Place 0.4 mg under the tongue every 5 (five) minutes as needed for chest pain.     [provider]  nystatin (MYCOSTATIN) 100000 UNIT/ML suspension Take 5 mLs (500,000 Units total) by mouth 4 (four) times daily. 03/07/19   Lauraine Rinne, NP  pantoprazole (PROTONIX) 40 MG tablet Take 1 tablet daily before first meal 01/08/19   Tanda Rockers, MD  predniSONE (STERAPRED UNI-PAK 21 TAB) 10 MG (21) TBPK tablet Take 6 tablets (60 mg total) by  mouth daily for 1 day, THEN 5 tablets (50 mg total) daily for 1 day, THEN 4 tablets (40 mg total) daily for 1 day, THEN 3 tablets (30 mg total) daily for 1 day, THEN 2 tablets (20 mg total) daily for 1 day, THEN 1 tablet (10 mg total) daily for 1 day. 05/10/19 05/16/19  Darr, Marguerita Beards, PA-C  tiZANidine (ZANAFLEX) 4 MG tablet Take 0.5 tablets (2 mg total) by mouth at bedtime. 05/10/19   Darr, Marguerita Beards, PA-C    Allergies    Contrast media [iodinated diagnostic agents], Adhesive [tape], and Penicillins  Review of Systems   Review of Systems  Constitutional: Negative for activity change.  HENT: Negative for congestion.   Respiratory: Negative for shortness of breath.   Cardiovascular: Negative for leg swelling.  Genitourinary: Negative for flank pain.  Musculoskeletal: Positive for neck pain.  Neurological: Negative for tremors and syncope.  Hematological: Negative for adenopathy.  Psychiatric/Behavioral: Negative for confusion.    Physical Exam Updated Vital Signs BP (!) 170/76   Pulse (!) 55   Temp 98.3 F (36.8 C) (Oral)   Resp (!) 23   SpO2 97%   Physical Exam Vitals and nursing note reviewed.  HENT:     Head: Normocephalic.  Eyes:     Pupils: Pupils are equal, round, and reactive to light.  Neck:     Comments: Tenderness musculature on left neck. Cardiovascular:     Rate and Rhythm: Rhythm irregular.  Pulmonary:     Breath sounds: No wheezing, rhonchi or rales.  Abdominal:     Tenderness: There is no abdominal tenderness.  Musculoskeletal:     Right lower leg: No edema.     Left lower leg: No edema.  Skin:    General: Skin is warm.     Capillary Refill: Capillary refill takes less than 2 seconds.  Neurological:     Mental Status: She is alert and oriented to person, place, and time.     ED Results / Procedures / Treatments   Labs (all labs ordered are listed, but only abnormal results are displayed) Labs Reviewed  BASIC METABOLIC PANEL - Abnormal; Notable for the  following components:  Result Value   Glucose, Bld 124 (*)    BUN 28 (*)    All other components within normal limits  CBC - Abnormal; Notable for the following components:   RBC 5.24 (*)    MCV 78.8 (*)    MCH 24.2 (*)    RDW 16.1 (*)    All other components within normal limits  CBG MONITORING, ED - Abnormal; Notable for the following components:   Glucose-Capillary 127 (*)    All other components within normal limits  TROPONIN I (HIGH SENSITIVITY) - Abnormal; Notable for the following components:   Troponin I (High Sensitivity) 18 (*)    All other components within normal limits  TROPONIN I (HIGH SENSITIVITY)    EKG EKG Interpretation  Date/Time:  Tuesday May 15 2019 11:34:02 EDT Ventricular Rate:  61 PR Interval:    QRS Duration: 90 QT Interval:  446 QTC Calculation: 448 R Axis:   80 Text Interpretation: Atrial fibrillation Nonspecific ST abnormality Abnormal ECG Confirmed by Davonna Belling [phone]) on 05/15/2019 12:00:50 PM Also confirmed by Davonna Belling [phone]), editor Hattie Perch ([id number])  on 05/15/2019 2:13:51 PM   Radiology No results found.  Procedures Procedures (including critical care time)  Medications Ordered in ED Medications  sodium chloride flush (NS) 0.9 % injection 3 mL (3 mLs Intravenous Not Given 05/15/19 1303)    ED Course  I have reviewed the triage vital signs and the nursing notes.  Pertinent labs & imaging results that were available during my care of the patient were reviewed by me and considered in my medical decision making (see chart for details).    MDM Rules/Calculators/A&P                      Patient presents with neck pain mildly pressure.  EKG reassuring.  Troponin stable but upper limits of normal.  Stable from recent labs also.  Discussed with Dr. Einar Gip, who can see the patient in follow-up.  States he called the office to make sure she can get in.  We will keep her blood pressure medicines going.  Had been on  some prednisone and then Solu-Medrol.  Likely a reason that her blood pressure is gone up.  Patient refused chest x-ray.  Discharge home with outpatient follow-up Final Clinical Impression(s) / ED Diagnoses Final diagnoses:  Palpitations  Hypertension, unspecified type    Rx / DC Orders ED Discharge Orders         Ordered    methocarbamol (ROBAXIN) 500 MG tablet  Every 8 hours PRN     05/15/19 1504           Davonna Belling, MD 05/15/19 1515

## 2019-05-15 NOTE — ED Triage Notes (Signed)
Pt reports she was dc'ed last week, states bp (200s), blood sugar (230s) are still running high and she continues to have palpitations. Pt a/ox4, resp e/u, nad.

## 2019-05-15 NOTE — ED Notes (Signed)
Pt stated "I do not want any chest xrays done while I am here today, I have been radiated enough."

## 2019-05-16 ENCOUNTER — Ambulatory Visit: Payer: Medicare Other | Attending: Nurse Practitioner | Admitting: Nurse Practitioner

## 2019-05-16 ENCOUNTER — Encounter: Payer: Self-pay | Admitting: Nurse Practitioner

## 2019-05-16 DIAGNOSIS — Z7689 Persons encountering health services in other specified circumstances: Secondary | ICD-10-CM | POA: Diagnosis not present

## 2019-05-16 DIAGNOSIS — I10 Essential (primary) hypertension: Secondary | ICD-10-CM | POA: Diagnosis not present

## 2019-05-16 DIAGNOSIS — E785 Hyperlipidemia, unspecified: Secondary | ICD-10-CM | POA: Diagnosis not present

## 2019-05-16 DIAGNOSIS — D696 Thrombocytopenia, unspecified: Secondary | ICD-10-CM

## 2019-05-16 DIAGNOSIS — E1165 Type 2 diabetes mellitus with hyperglycemia: Secondary | ICD-10-CM

## 2019-05-16 DIAGNOSIS — J411 Mucopurulent chronic bronchitis: Secondary | ICD-10-CM

## 2019-05-16 MED ORDER — AMLODIPINE BESYLATE 5 MG PO TABS: 5.0000 mg | ORAL_TABLET | Freq: Every day | ORAL | 3 refills | Status: DC

## 2019-05-16 NOTE — Progress Notes (Signed)
Virtual Visit via Telephone Note Due to national recommendations of social distancing due to [place] 19, telehealth visit is felt to be most appropriate for this patient at this time.  I discussed the limitations, risks, security and privacy concerns of performing an evaluation and management service by telephone and the availability of in person appointments. I also discussed with the patient that there may be a patient responsible charge related to this service. The patient expressed understanding and agreed to proceed.    I connected with Sharon Spears on 05/16/19  at   8:50 AM EDT  EDT by telephone and verified that I am speaking with the correct person using two identifiers.   Consent I discussed the limitations, risks, security and privacy concerns of performing an evaluation and management service by telephone and the availability of in person appointments. I also discussed with the patient that there may be a patient responsible charge related to this service. The patient expressed understanding and agreed to proceed.   Location of Patient: Private Residence   Location of Provider: [place] and [employer] Office    Persons participating in Telemedicine visit: [name] FNP-BC [place]    History of Present Illness: Telemedicine visit for: Establish Care  PMH includes Adjustment disorder with anxiety, Asthma, Afib (Last saw Dr. Einar Gip Cardiology on 12-11-2018), HTN, COPD (seeing [facility]), Coronary artery disease (MVR with Redo CABG 2019), Diabetic neuropathic arthritis,  Other cirrhosis of liver (07/20/2018), and Uncontrolled DM TYPE 2 with Dyslipidemia (LDL not at goal)   She will be traveling to [facility] for a few months to stay with her son and leaving in a few days. Nor sure when she will return to [place] but states sometime in June she believes.   Afib On Permanent anticoagulation Eliquis 5 mg BID.   Hypertension Currently taking  Losartan 100 mg daily. Only takes imdur 90 mg as needed. Will add amlodipine 5 mg daily today. She will call the office with blood pressure readings once starting as she can not return to office in 2 weeks due to going out of town. Blood pressure has been elevated. Denies chest pain, shortness of breath, palpitations, lightheadedness, dizziness, headaches or BLE edema.  BP Readings from Last 3 Encounters:  05/15/19 (!) 192/85  05/10/19 (!) 148/68  05/10/19 (!) 177/68    DM TYPE 2 Monitoring blood glucose levels twice per day. Fasting reading 128 this morning. Postprandial reading 235 yesterday. Currently taking metformin XR 500 mg daily. Denies symptoms of hypoglycemia. She has a history of diabetic neuropathic arthritis. A1c due. LDL not at goal of <70.  Lab Results  Component Value Date   HGBA1C 7.8 (H) 06/02/2016   Lab Results  Component Value Date   LDLCALC 106 (H) 07/20/2018    Past Medical History:  Diagnosis Date  . Adjustment disorder with anxiety   . Asthma   . Atrial fibrillation ([place])   . Benign essential hypertension   . COPD (chronic obstructive pulmonary disease) ([place])   . Coronary artery disease   . Diabetic neuropathic arthritis ([place])   . Heart palpitations   . Hypoxia 07/19/2018  . Other cirrhosis of liver ([place]) 07/20/2018  . Uncontrolled diabetes mellitus type 2 without complications     Past Surgical History:  Procedure Laterality Date  . CARDIAC SURGERY    . CESAREAN SECTION    . CORONARY ARTERY BYPASS GRAFT  07/06/2017   Redo-sternotomy and CABG 2 with LIMA to LAD, SVG to OM,  and a a closure, mitral valve repair with #30 mm [place] ring on 07/06/2017  . ESOPHAGEAL MANOMETRY N/A 03/06/2012   Procedure: ESOPHAGEAL MANOMETRY (EM);  Surgeon: Beryle Beams, MD;  Location: [facility];  Service: Endoscopy;  Laterality: N/A;  . IR ANGIOGRAM SELECTIVE EACH ADDITIONAL VESSEL  02/16/2019  . IR ANGIOGRAM SELECTIVE EACH ADDITIONAL VESSEL  02/16/2019  . IR ANGIOGRAM  SELECTIVE EACH ADDITIONAL VESSEL  02/16/2019  . IR ANGIOGRAM SELECTIVE EACH ADDITIONAL VESSEL  02/16/2019  . IR RENAL SELECTIVE  UNI INC S&I MOD SED  02/16/2019  . IR US GUIDE VASC ACCESS RIGHT  02/16/2019  . TONSILLECTOMY      Family History  Problem Relation Age of Onset  . Asthma Mother   . Alcohol abuse Father     Social History   Socioeconomic History  . Marital status: Legally Separated    Spouse name: Not on file  . Number of children: 4  . Years of education: 7  . Highest education level: Not on file  Occupational History  . Occupation: Retired  Tobacco Use  . Smoking status: Never Smoker  . Smokeless tobacco: Never Used  Substance and Sexual Activity  . Alcohol use: No  . Drug use: Never  . Sexual activity: Yes  Other Topics Concern  . Not on file  Social History Narrative   Fun/Hobby: Gardening    Denies abuse and feels safe at home.    1 son deceased   Social Determinants of Radio broadcast assistant Strain:   . Difficulty of Paying Living Expenses:   Food Insecurity:   . Worried About Charity fundraiser in the Last Year:   . Arboriculturist in the Last Year:   Transportation Needs:   . Film/video editor (Medical):   Marland Kitchen Lack of Transportation (Non-Medical):   Physical Activity:   . Days of Exercise per Week:   . Minutes of Exercise per Session:   Stress:   . Feeling of Stress :   Social Connections:   . Frequency of Communication with Friends and Family:   . Frequency of Social Gatherings with Friends and Family:   . Attends Religious Services:   . Active Member of Clubs or Organizations:   . Attends Archivist Meetings:   Marland Kitchen Marital Status:      Observations/Objective: Awake, alert and oriented x 3   ROS  Assessment and Plan: Sharon Spears was seen today for new patient (initial visit).  Diagnoses and all orders for this visit:  Encounter to establish care  Type 2 diabetes mellitus with hyperglycemia, without long-term current use of  insulin ([place]) -     Hemoglobin A1c; Future Continue blood sugar control as discussed in office today, low carbohydrate diet, and regular physical exercise as tolerated, 150 minutes per week (30 min each day, 5 days per week, or 50 min 3 days per week). Keep blood sugar logs with fasting goal of 90-130 mg/dl, post prandial (after you eat) less than 180.  For Hypoglycemia: BS <60 and Hyperglycemia BS >400; contact the clinic ASAP. Annual eye exams and foot exams are recommended.   Essential hypertension -     amLODipine (NORVASC) 5 MG tablet; Take 1 tablet (5 mg total) by mouth daily. -     CMP14+EGFR; Future Continue all antihypertensives as prescribed.  Remember to bring in your blood pressure log with you for your follow up appointment.  DASH/Mediterranean Diets are healthier choices for HTN.  Dyslipidemia, goal LDL below 70 -     Lipid panel; Future INSTRUCTIONS: Work on a low fat, heart healthy diet and participate in regular aerobic exercise program by working out at least 150 minutes per week; 5 days a week-30 minutes per day. Avoid red meat/beef/steak,  fried foods. junk foods, sodas, sugary drinks, unhealthy snacking, alcohol and smoking.  Drink at least 80 oz of water per day and monitor your carbohydrate intake daily.    Mucopurulent chronic bronchitis ([place]) Continue follow up with Pulmonology.  She denies worsening cough, wheezing or shortness of breath  Thrombocytopenia ([facility]) -     CBC; Future     Follow Up Instructions No follow-ups on file.     I discussed the assessment and treatment plan with the patient. The patient was provided an opportunity to ask questions and all were answered. The patient agreed with the plan and demonstrated an understanding of the instructions.   The patient was advised to call back or seek an in-person evaluation if the symptoms worsen or if the condition fails to improve as anticipated.  I provided 20 minutes of non-face-to-face time  during this encounter including median intraservice time, reviewing previous notes, labs, imaging, medications and explaining diagnosis and management.  Gildardo Pounds, FNP-BC

## 2019-06-05 ENCOUNTER — Encounter: Payer: Self-pay | Admitting: Nurse Practitioner

## 2019-06-05 ENCOUNTER — Ambulatory Visit: Payer: Medicare Other | Attending: Nurse Practitioner | Admitting: Nurse Practitioner

## 2019-06-05 ENCOUNTER — Other Ambulatory Visit: Payer: Self-pay

## 2019-06-05 VITALS — BP 202/73 | HR 54 | Temp 97.0°F | Ht 63.0 in | Wt 147.0 lb

## 2019-06-05 DIAGNOSIS — F339 Major depressive disorder, recurrent, unspecified: Secondary | ICD-10-CM

## 2019-06-05 DIAGNOSIS — E1165 Type 2 diabetes mellitus with hyperglycemia: Secondary | ICD-10-CM

## 2019-06-05 DIAGNOSIS — J410 Simple chronic bronchitis: Secondary | ICD-10-CM | POA: Diagnosis not present

## 2019-06-05 DIAGNOSIS — E785 Hyperlipidemia, unspecified: Secondary | ICD-10-CM

## 2019-06-05 DIAGNOSIS — I10 Essential (primary) hypertension: Secondary | ICD-10-CM

## 2019-06-05 DIAGNOSIS — D696 Thrombocytopenia, unspecified: Secondary | ICD-10-CM

## 2019-06-05 DIAGNOSIS — Z1211 Encounter for screening for malignant neoplasm of colon: Secondary | ICD-10-CM

## 2019-06-05 LAB — GLUCOSE, POCT (MANUAL RESULT ENTRY): POC Glucose: 205 mg/dl — AB (ref 70–99)

## 2019-06-05 LAB — POCT GLYCOSYLATED HEMOGLOBIN (HGB A1C): Hemoglobin A1C: 6.9 % — AB (ref 4.0–5.6)

## 2019-06-05 MED ORDER — AMLODIPINE BESYLATE 10 MG PO TABS: 10.0000 mg | ORAL_TABLET | Freq: Every day | ORAL | 1 refills | Status: DC

## 2019-06-05 MED ORDER — ATORVASTATIN CALCIUM 40 MG PO TABS
40.0000 mg | ORAL_TABLET | Freq: Every day | ORAL | 3 refills | Status: DC
Start: 2019-06-05 — End: 2019-07-03

## 2019-06-05 MED ORDER — METFORMIN HCL ER 500 MG PO TB24: 500.0000 mg | ORAL_TABLET | Freq: Every day | ORAL | 0 refills | Status: DC

## 2019-06-05 MED ORDER — FLUTICASONE PROPIONATE 50 MCG/ACT NA SUSP: 2.0000 | Freq: Two times a day (BID) | NASAL | 2 refills | Status: DC

## 2019-06-05 MED ORDER — PANTOPRAZOLE SODIUM 40 MG PO TBEC: DELAYED_RELEASE_TABLET | ORAL | 1 refills | Status: DC

## 2019-06-05 MED ORDER — ESCITALOPRAM OXALATE 10 MG PO TABS: 10.0000 mg | ORAL_TABLET | Freq: Every day | ORAL | 1 refills | Status: DC

## 2019-06-05 MED ORDER — LOSARTAN POTASSIUM 100 MG PO TABS: 100.0000 mg | ORAL_TABLET | Freq: Every day | ORAL | 1 refills | Status: DC

## 2019-06-05 MED ORDER — FLOVENT HFA 44 MCG/ACT IN AERO: 2.0000 | INHALATION_SPRAY | Freq: Two times a day (BID) | RESPIRATORY_TRACT | 12 refills | Status: DC

## 2019-06-05 NOTE — Progress Notes (Signed)
Assessment & Plan:  Sharon Spears was seen today for blood pressure check.  Diagnoses and all orders for this visit:  Type 2 diabetes mellitus with hyperglycemia, without long-term current use of insulin (HCC) -     Glucose (CBG) -     HgB A1c -     Microalbumin/Creatinine Ratio, Urine -     metFORMIN (GLUCOPHAGE XR) 500 MG 24 hr tablet; Take 1 tablet (500 mg total) by mouth daily with breakfast. Continue blood sugar control as discussed in office today, low carbohydrate diet, and regular physical exercise as tolerated, 150 minutes per week (30 min each day, 5 days per week, or 50 min 3 days per week). Keep blood sugar logs with fasting goal of 90-130 mg/dl, post prandial (after you eat) less than 180.  For Hypoglycemia: BS <60 and Hyperglycemia BS >400; contact the clinic ASAP. Annual eye exams and foot exams are recommended.  Essential hypertension -     amLODipine (NORVASC) 10 MG tablet; Take 1 tablet (10 mg total) by mouth daily. -     losartan (COZAAR) 100 MG tablet; Take 1 tablet (100 mg total) by mouth daily. -     CMP14+EGFR Continue all antihypertensives as prescribed.  Remember to bring in your blood pressure log with you for your follow up appointment.  DASH/Mediterranean Diets are healthier choices for HTN.    Episode of recurrent major depressive disorder, unspecified depression episode severity ([place]) -     escitalopram (LEXAPRO) 10 MG tablet; Take 1 tablet (10 mg total) by mouth daily.  Simple chronic bronchitis (HCC) -     fluticasone (FLOVENT HFA) 44 MCG/ACT inhaler; Inhale 2 puffs into the lungs 2 (two) times daily. -     pantoprazole (PROTONIX) 40 MG tablet; Take 1 tablet daily before first meal -     fluticasone (FLONASE) 50 MCG/ACT nasal spray; Place 2 sprays into both nostrils 2 (two) times daily.  Colon cancer screening -     Fecal occult blood, imunochemical([facility])  Dyslipidemia, goal LDL below 70 -     Lipid panel INSTRUCTIONS: Work on a low fat,  heart healthy diet and participate in regular aerobic exercise program by working out at least 150 minutes per week; 5 days a week-30 minutes per day. Avoid red meat/beef/steak,  fried foods. junk foods, sodas, sugary drinks, unhealthy snacking, alcohol and smoking.  Drink at least 80 oz of water per day and monitor your carbohydrate intake daily.    Thrombocytopenia ([place]) -     CBC    Patient has been counseled on age-appropriate routine health concerns for screening and prevention. These are reviewed and up-to-date. Referrals have been placed accordingly. Immunizations are up-to-date or declined.    Subjective:   Chief Complaint  Patient presents with  . Blood Pressure Check    Pt. is here for high blood pressure. Pt. stated her reading at home 200/82.    HPI Sharon Spears 74 y.o. female presents to office today for follow up. Blood pressure is elevated today. Due to heart rate she was not given clonidine in the office. She denies chest pain. Has been over excessively using multiple nebulizers. Per her Pulmonologist note she declined inhalers. I am increasing her amlodipine 5 mg to 10 mg today. She is also taking losartan 100 mg daily.  She takes imdur only as needed.  BP Readings from Last 3 Encounters:  06/05/19 (!) 202/73  05/15/19 (!) 192/85  05/10/19 (!) 148/68    Anxiety and Depression Will  start lexapro 10 mg today. Denies any thoughts of self harm.  Depression screen PHQ 2/9 06/05/2019  Decreased Interest 1  Down, Depressed, Hopeless 2  PHQ - 2 Score 3  Altered sleeping 0  Tired, decreased energy 3  Change in appetite 2  Feeling bad or failure about yourself  1  Trouble concentrating 3  Moving slowly or fidgety/restless 3  Suicidal thoughts 2  PHQ-9 Score 17    Chronic Bronchitis She states she has congestion in her chest that she can not get up. She constantly cleared her throat and spit white phlegm in a napkin. She does not have a chronic cough. Lungs are clear  on auscultation.She has been using multiple nebulizers as well as daily use of her SABA. She is not an any respiratory distress today. I will prescribe flovent today to hopefully help reduce her overuse of nebs.  She will continue on nightly singulair and I will also refill protonix. Believe there may be an anxiety component with her symptoms today.   DM TYPE 2 Taking metformin 500 mg daily. Well controlled. Denies hypo or hyperglycemic symptoms. She does not monitor her blood glucose levels at home. LDL not at goal. Will restart her statin that she has not been taking (atorvastatin) Lab Results  Component Value Date   HGBA1C 6.9 (A) 06/05/2019   Lab Results  Component Value Date   LDLCALC 106 (H) 07/20/2018   Review of Systems  Constitutional: Negative for fever, malaise/fatigue and weight loss.  HENT: Negative.  Negative for nosebleeds.   Eyes: Negative.  Negative for blurred vision, double vision and photophobia.  Respiratory: Positive for sputum production. Negative for cough, shortness of breath and wheezing.   Cardiovascular: Negative.  Negative for chest pain, palpitations and leg swelling.  Gastrointestinal: Negative.  Negative for heartburn, nausea and vomiting.  Musculoskeletal: Negative.  Negative for myalgias.  Neurological: Negative.  Negative for dizziness, focal weakness, seizures and headaches.  Psychiatric/Behavioral: Positive for depression. Negative for suicidal ideas. The patient is nervous/anxious.     Past Medical History:  Diagnosis Date  . Adjustment disorder with anxiety   . Asthma   . Atrial fibrillation ([place])   . Benign essential hypertension   . COPD (chronic obstructive pulmonary disease) ([place])   . Coronary artery disease   . Diabetic neuropathic arthritis ([place])   . Heart palpitations   . Hypoxia 07/19/2018  . Other cirrhosis of liver ([place]) 07/20/2018  . Uncontrolled diabetes mellitus type 2 without complications     Past Surgical History:   Procedure Laterality Date  . CARDIAC SURGERY    . CESAREAN SECTION    . CORONARY ARTERY BYPASS GRAFT  07/06/2017   Redo-sternotomy and CABG 2 with LIMA to LAD, SVG to OM, and a a closure, mitral valve repair with #30 mm [place] ring on 07/06/2017  . ESOPHAGEAL MANOMETRY N/A 03/06/2012   Procedure: ESOPHAGEAL MANOMETRY (EM);  Surgeon: Beryle Beams, MD;  Location: [facility];  Service: Endoscopy;  Laterality: N/A;  . IR ANGIOGRAM SELECTIVE EACH ADDITIONAL VESSEL  02/16/2019  . IR ANGIOGRAM SELECTIVE EACH ADDITIONAL VESSEL  02/16/2019  . IR ANGIOGRAM SELECTIVE EACH ADDITIONAL VESSEL  02/16/2019  . IR ANGIOGRAM SELECTIVE EACH ADDITIONAL VESSEL  02/16/2019  . IR RENAL SELECTIVE  UNI INC S&I MOD SED  02/16/2019  . IR US GUIDE VASC ACCESS RIGHT  02/16/2019  . TONSILLECTOMY      Family History  Problem Relation Age of Onset  . Asthma Mother   .  Alcohol abuse Father     Social History Reviewed with no changes to be made today.   Outpatient Medications Prior to Visit  Medication Sig Dispense Refill  . acetaminophen (TYLENOL) 325 MG tablet Take 2 tablets (650 mg total) by mouth every 6 (six) hours as needed for fever or headache. 30 tablet 0  . albuterol (VENTOLIN HFA) 108 (90 Base) MCG/ACT inhaler INHALE 2 PUFFS INTO THE LUNGS EVERY 6 HOURS AS NEEDED FOR WHEEZE 18 g 3  . apixaban (ELIQUIS) 5 MG TABS tablet Take 1 tablet (5 mg total) by mouth 2 (two) times daily. 60 tablet 3  . Blood Glucose Monitoring Suppl (ACCU-CHEK AVIVA PLUS) w/Device KIT USE TO CHECK BLOOD SUGARS ONCE DAILY    . budesonide (PULMICORT) 0.25 MG/2ML nebulizer solution Take 2 mLs (0.25 mg total) by nebulization 2 (two) times daily. 120 mL 12  . cetirizine (ZYRTEC) 10 MG tablet Take 1 tablet (10 mg total) by mouth daily. 30 tablet 6  . diphenhydrAMINE (BENADRYL) 12.5 MG/5ML elixir Take 12.5 mg by mouth daily as needed.     . famotidine (PEPCID) 20 MG tablet Take 20 mg by mouth daily before breakfast.   3  . ferrous sulfate 325 (65  FE) MG tablet Take 1 tablet (325 mg total) by mouth 3 (three) times daily with meals. (Patient taking differently: Take 325 mg by mouth daily. ) 90 tablet 3  . furosemide (LASIX) 20 MG tablet Take 1 tablet (20 mg total) by mouth 2 (two) times daily.    Marland Kitchen lidocaine (LIDODERM) 5 % Place 1 patch onto the skin daily. Remove & Discard patch within 12 hours or as directed by MD 30 patch 0  . magic mouthwash w/lidocaine SOLN Take 5 mLs by mouth 3 (three) times daily as needed for mouth pain. 75 mL 0  . montelukast (SINGULAIR) 10 MG tablet Take 1 tablet (10 mg total) by mouth at bedtime. (Patient taking differently: Take 10 mg by mouth as needed. ) 30 tablet 11  . Multiple Vitamin (MULTIVITAMIN WITH MINERALS) TABS tablet Take 1 tablet by mouth daily. (Patient taking differently: Take 1 tablet by mouth. occas) 30 tablet 3  . nitroGLYCERIN (NITROSTAT) 0.4 MG SL tablet Place 0.4 mg under the tongue every 5 (five) minutes as needed for chest pain.     Marland Kitchen nystatin (MYCOSTATIN) 100000 UNIT/ML suspension Take 5 mLs (500,000 Units total) by mouth 4 (four) times daily. 60 mL 0  . fluticasone (FLONASE) 50 MCG/ACT nasal spray Place 2 sprays into both nostrils 2 (two) times daily. (Patient taking differently: Place 2 sprays into both nostrils as needed. ) 1 g 2  . losartan (COZAAR) 100 MG tablet Take 100 mg by mouth daily.    . metFORMIN (GLUCOPHAGE XR) 500 MG 24 hr tablet Take 1 tablet (500 mg total) by mouth daily with breakfast. (Patient taking differently: Take 500 mg by mouth 2 (two) times daily. ) 30 tablet 2  . pantoprazole (PROTONIX) 40 MG tablet Take 1 tablet daily before first meal 30 tablet 0  . albuterol (PROVENTIL) (2.5 MG/3ML) 0.083% nebulizer solution Take 3 mLs (2.5 mg total) by nebulization every 6 (six) hours as needed for wheezing or shortness of breath. (Patient not taking: Reported on 04/25/2019) 75 mL 12  . arformoterol (BROVANA) 15 MCG/2ML NEBU Take 2 mLs (15 mcg total) by nebulization 2 (two) times  daily. (Patient not taking: Reported on 05/16/2019) 120 mL 6  . glucose blood (ACCU-CHEK AVIVA PLUS) test strip USE WITH GLUCOMETER  TO CHECK BLOOD SUGARS ONCE DAILY    . ipratropium-albuterol (DUONEB) 0.5-2.5 (3) MG/3ML SOLN Take 3 mLs by nebulization every 4 (four) hours as needed. (Patient not taking: Reported on 04/25/2019) 360 mL 12  . isosorbide mononitrate (IMDUR) 30 MG 24 hr tablet Take 1 tablet by mouth as needed.     Marland Kitchen amLODipine (NORVASC) 5 MG tablet Take 1 tablet (5 mg total) by mouth daily. (Patient not taking: Reported on 06/05/2019) 90 tablet 3   No facility-administered medications prior to visit.    Allergies  Allergen Reactions  . Contrast Media [Iodinated Diagnostic Agents] Anaphylaxis    Per pt she had anaphylaxis reaction to contrast media in the past. States she couldn't breathe and they had to give her epi.   . Adhesive [Tape] Itching  . Penicillins Other (See Comments)    GI upset Has patient had a PCN reaction causing immediate rash, facial/tongue/throat swelling, SOB or lightheadedness with hypotension: No Has patient had a PCN reaction causing severe rash involving mucus membranes or skin necrosis: No Has patient had a PCN reaction that required hospitalization: No Has patient had a PCN reaction occurring within the last 10 years: No If all of the above answers are "NO", then may proceed with Cephalosporin use.       Objective:    BP (!) 202/73 (BP Location: Left Arm, Patient Position: Sitting, Cuff Size: Normal)   Pulse (!) 54   Temp (!) 97 F (36.1 C) (Temporal)   Ht 5' 3" (1.6 m)   Wt 147 lb (66.7 kg)   SpO2 95%   BMI 26.04 kg/m  Wt Readings from Last 3 Encounters:  06/05/19 147 lb (66.7 kg)  05/10/19 146 lb 9.6 oz (66.5 kg)  05/10/19 146 lb 9.6 oz (66.5 kg)    Physical Exam Vitals and nursing note reviewed.  Constitutional:      Appearance: She is well-developed.  HENT:     Head: Normocephalic and atraumatic.  Cardiovascular:     Rate and  Rhythm: Normal rate and regular rhythm.     Heart sounds: Normal heart sounds. No murmur. No friction rub. No gallop.   Pulmonary:     Effort: Pulmonary effort is normal. No tachypnea or respiratory distress.     Breath sounds: Normal breath sounds and air entry. No decreased air movement. No decreased breath sounds, wheezing, rhonchi or rales.  Chest:     Chest wall: No tenderness.  Abdominal:     General: Bowel sounds are normal.     Palpations: Abdomen is soft.  Musculoskeletal:        General: Normal range of motion.     Cervical back: Normal range of motion.  Skin:    General: Skin is warm and dry.  Neurological:     Mental Status: She is alert and oriented to person, place, and time.     Coordination: Coordination normal.  Psychiatric:        Behavior: Behavior normal. Behavior is cooperative.        Thought Content: Thought content normal.        Judgment: Judgment normal.          Patient has been counseled extensively about nutrition and exercise as well as the importance of adherence with medications and regular follow-up. The patient was given clear instructions to go to ER or return to medical center if symptoms don't improve, worsen or new problems develop. The patient verbalized understanding.   Follow-up: Return in about 2  weeks (around 06/19/2019) for BP recheck with luke then see me in 3 months.   Gildardo Pounds, FNP-BC [facility] and [place], [place]   06/05/2019, 9:11 PM

## 2019-06-06 LAB — LIPID PANEL
Chol/HDL Ratio: 3.6 ratio (ref 0.0–4.4)
Cholesterol, Total: 199 mg/dL (ref 100–199)
HDL: 55 mg/dL (ref >39)
LDL Chol Calc (NIH): 123 mg/dL — ABNORMAL HIGH (ref 0–99)
Triglycerides: 118 mg/dL (ref 0–149)
VLDL Cholesterol Cal: 21 mg/dL (ref 5–40)

## 2019-06-06 LAB — CMP14+EGFR
ALT: 41 IU/L — ABNORMAL HIGH (ref 0–32)
AST: 40 IU/L (ref 0–40)
Albumin/Globulin Ratio: 1.5 (ref 1.2–2.2)
Albumin: 4.4 g/dL (ref 3.7–4.7)
Alkaline Phosphatase: 96 IU/L (ref 48–121)
BUN/Creatinine Ratio: 26 (ref 12–28)
BUN: 23 mg/dL (ref 8–27)
Bilirubin Total: 0.6 mg/dL (ref 0.0–1.2)
CO2: 22 mmol/L (ref 20–29)
Calcium: 9.5 mg/dL (ref 8.7–10.3)
Chloride: 104 mmol/L (ref 96–106)
Creatinine, Ser: 0.87 mg/dL (ref 0.57–1.00)
GFR calc Af Amer: 77 mL/min/{1.73_m2} (ref >59)
GFR calc non Af Amer: 67 mL/min/{1.73_m2} (ref >59)
Globulin, Total: 3 g/dL (ref 1.5–4.5)
Glucose: 115 mg/dL — ABNORMAL HIGH (ref 65–99)
Potassium: 4.3 mmol/L (ref 3.5–5.2)
Sodium: 141 mmol/L (ref 134–144)
Total Protein: 7.4 g/dL (ref 6.0–8.5)

## 2019-06-06 LAB — CBC
Hematocrit: 40.8 % (ref 34.0–46.6)
Hemoglobin: 13 g/dL (ref 11.1–15.9)
MCH: 24.7 pg — ABNORMAL LOW (ref 26.6–33.0)
MCHC: 31.9 g/dL (ref 31.5–35.7)
MCV: 78 fL — ABNORMAL LOW (ref 79–97)
Platelets: 159 10*3/uL (ref 150–450)
RBC: 5.26 x10E6/uL (ref 3.77–5.28)
RDW: 17 % — ABNORMAL HIGH (ref 11.7–15.4)
WBC: 6 10*3/uL (ref 3.4–10.8)

## 2019-06-06 LAB — MICROALBUMIN / CREATININE URINE RATIO
Creatinine, Urine: 68.5 mg/dL
Microalb/Creat Ratio: 369 mg/g creat — ABNORMAL HIGH (ref 0–29)
Microalbumin, Urine: 253 ug/mL

## 2019-06-18 ENCOUNTER — Telehealth: Payer: Self-pay | Admitting: Pulmonary Disease

## 2019-06-18 NOTE — Telephone Encounter (Signed)
ATC Patient.  LM to call back. 

## 2019-06-19 NOTE — Telephone Encounter (Signed)
lmtcb for pt.  

## 2019-06-19 NOTE — Telephone Encounter (Signed)
Pt returning a phone call. Pt can be reached at [phone].

## 2019-06-19 NOTE — Telephone Encounter (Signed)
Patient is returning phone call. Patient phone number is [phone].

## 2019-06-19 NOTE — Telephone Encounter (Signed)
Left message for patient to call back.

## 2019-06-20 NOTE — Telephone Encounter (Signed)
LMTCB x 3 and will close encounter per protocol

## 2019-06-21 ENCOUNTER — Telehealth: Payer: Self-pay

## 2019-06-21 NOTE — Telephone Encounter (Signed)
Pt contacted the office to see if her handicap application was completed. Looked in pcp box and didn't see it.

## 2019-06-22 NOTE — Telephone Encounter (Signed)
Received Handicap form from Dr. Joya Gaskins contacted pt and made aware that form is ready for pick up

## 2019-07-01 NOTE — Progress Notes (Signed)
Subjective:    Patient ID: Sharon Spears, female    DOB: July 31, 1945, 74 y.o.   MRN: [id number]  73 y.o.F referred by her primary care physician Ms. Fleming for evaluation of asthma and breathing.  Note the patient does follow regularly with the pulmonary clinic and has already got a diagnosis of severe persistent asthma and has been under treatment with Brovana and budesonide by nebulization.  However the patient's not been taking this on a regular basis.  The patient states she has had increased dyspnea wheezing and cough which got worse after she developed pneumonia in December 2019.  She is not been tested with Covid being positive this past year.  She does have the persistent asthma symptoms at this time.  She has a difficulty with clearing secretions and coughing up mucus does also have somewhat of a sore throat at times.  She does have laryngal pharyngeal reflux in her chart as listed.  Review shortness of breath assessment below Shortness of Breath This is a chronic problem. The current episode started more than 1 month ago. The problem occurs constantly. The problem has been gradually worsening. Associated symptoms include chest pain, PND, rhinorrhea, a sore throat and wheezing. Pertinent negatives include no abdominal pain, claudication, coryza, ear pain, fever, headaches, hemoptysis, leg pain, leg swelling, neck pain, orthopnea, rash, sputum production, swollen glands, syncope or vomiting. The symptoms are aggravated by any activity, exercise, fumes, pollens, odors, URIs, smoke and weather changes. She has tried beta agonist inhalers, oral steroids and steroid inhalers for the symptoms. The treatment provided moderate relief. Her past medical history is significant for asthma, CAD, COPD and pneumonia. There is no history of bronchiolitis, DVT, a heart failure, PE or a recent surgery.   Past Medical History:  Diagnosis Date  . Adjustment disorder with anxiety   . Asthma   . Atrial  fibrillation ([place])   . Benign essential hypertension   . COPD (chronic obstructive pulmonary disease) ([place])   . Coronary artery disease   . Diabetic neuropathic arthritis ([place])   . Heart palpitations   . Hypoxia 07/19/2018  . Other cirrhosis of liver ([place]) 07/20/2018  . Uncontrolled diabetes mellitus type 2 without complications      Family History  Problem Relation Age of Onset  . Asthma Mother   . Alcohol abuse Father      Social History   Socioeconomic History  . Marital status: Legally Separated    Spouse name: Not on file  . Number of children: 4  . Years of education: 80  . Highest education level: Not on file  Occupational History  . Occupation: Retired  Tobacco Use  . Smoking status: Never Smoker  . Smokeless tobacco: Never Used  Vaping Use  . Vaping Use: Never used  Substance and Sexual Activity  . Alcohol use: No  . Drug use: Never  . Sexual activity: Yes  Other Topics Concern  . Not on file  Social History Narrative   Fun/Hobby: Gardening    Denies abuse and feels safe at home.    1 son deceased   Social Determinants of Radio broadcast assistant Strain:   . Difficulty of Paying Living Expenses:   Food Insecurity:   . Worried About Charity fundraiser in the Last Year:   . Arboriculturist in the Last Year:   Transportation Needs:   . Film/video editor (Medical):   Marland Kitchen Lack of Transportation (Non-Medical):   Physical Activity:   .  Days of Exercise per Week:   . Minutes of Exercise per Session:   Stress:   . Feeling of Stress :   Social Connections:   . Frequency of Communication with Friends and Family:   . Frequency of Social Gatherings with Friends and Family:   . Attends Religious Services:   . Active Member of Clubs or Organizations:   . Attends Archivist Meetings:   Marland Kitchen Marital Status:   Intimate Partner Violence:   . Fear of Current or Ex-Partner:   . Emotionally Abused:   Marland Kitchen Physically Abused:   . Sexually Abused:       Allergies  Allergen Reactions  . Contrast Media [Iodinated Diagnostic Agents] Anaphylaxis    Per pt she had anaphylaxis reaction to contrast media in the past. States she couldn't breathe and they had to give her epi.   . Adhesive [Tape] Itching  . Penicillins Other (See Comments)    GI upset Has patient had a PCN reaction causing immediate rash, facial/tongue/throat swelling, SOB or lightheadedness with hypotension: No Has patient had a PCN reaction causing severe rash involving mucus membranes or skin necrosis: No Has patient had a PCN reaction that required hospitalization: No Has patient had a PCN reaction occurring within the last 10 years: No If all of the above answers are "NO", then may proceed with Cephalosporin use.     Outpatient Medications Prior to Visit  Medication Sig Dispense Refill  . acetaminophen (TYLENOL) 325 MG tablet Take 2 tablets (650 mg total) by mouth every 6 (six) hours as needed for fever or headache. 30 tablet 0  . albuterol (VENTOLIN HFA) 108 (90 Base) MCG/ACT inhaler INHALE 2 PUFFS INTO THE LUNGS EVERY 6 HOURS AS NEEDED FOR WHEEZE 18 g 3  . amLODipine (NORVASC) 10 MG tablet Take 1 tablet (10 mg total) by mouth daily. 90 tablet 1  . apixaban (ELIQUIS) 5 MG TABS tablet Take 1 tablet (5 mg total) by mouth 2 (two) times daily. 60 tablet 3  . atorvastatin (LIPITOR) 40 MG tablet Take 1 tablet (40 mg total) by mouth daily. 90 tablet 3  . Blood Glucose Monitoring Suppl (ACCU-CHEK AVIVA PLUS) w/Device KIT USE TO CHECK BLOOD SUGARS ONCE DAILY    . cetirizine (ZYRTEC) 10 MG tablet Take 1 tablet (10 mg total) by mouth daily. 30 tablet 6  . diphenhydrAMINE (BENADRYL) 12.5 MG/5ML elixir Take 12.5 mg by mouth daily as needed.     Marland Kitchen escitalopram (LEXAPRO) 10 MG tablet Take 1 tablet (10 mg total) by mouth daily. 90 tablet 1  . fluticasone (FLONASE) 50 MCG/ACT nasal spray Place 2 sprays into both nostrils 2 (two) times daily. 16 g 2  . furosemide (LASIX) 20 MG tablet Take 1  tablet (20 mg total) by mouth 2 (two) times daily.    Marland Kitchen glucose blood (ACCU-CHEK AVIVA PLUS) test strip USE WITH GLUCOMETER TO CHECK BLOOD SUGARS ONCE DAILY    . isosorbide mononitrate (IMDUR) 30 MG 24 hr tablet Take 1 tablet by mouth as needed.     . nystatin (MYCOSTATIN) 100000 UNIT/ML suspension Take 5 mLs (500,000 Units total) by mouth 4 (four) times daily. 60 mL 0  . pantoprazole (PROTONIX) 40 MG tablet Take 1 tablet daily before first meal 90 tablet 1  . budesonide (PULMICORT) 0.25 MG/2ML nebulizer solution Take 2 mLs (0.25 mg total) by nebulization 2 (two) times daily. 120 mL 12  . famotidine (PEPCID) 20 MG tablet Take 20 mg by mouth daily before breakfast.  3  . fluticasone (FLOVENT HFA) 44 MCG/ACT inhaler Inhale 2 puffs into the lungs 2 (two) times daily. 1 Inhaler 12  . montelukast (SINGULAIR) 10 MG tablet Take 1 tablet (10 mg total) by mouth at bedtime. (Patient taking differently: Take 10 mg by mouth as needed. ) 30 tablet 11  . ferrous sulfate 325 (65 FE) MG tablet Take 1 tablet (325 mg total) by mouth 3 (three) times daily with meals. (Patient not taking: Reported on 07/02/2019) 90 tablet 3  . losartan (COZAAR) 100 MG tablet Take 1 tablet (100 mg total) by mouth daily. 90 tablet 1  . metFORMIN (GLUCOPHAGE XR) 500 MG 24 hr tablet Take 1 tablet (500 mg total) by mouth daily with breakfast. 90 tablet 0  . nitroGLYCERIN (NITROSTAT) 0.4 MG SL tablet Place 0.4 mg under the tongue every 5 (five) minutes as needed for chest pain.  (Patient not taking: Reported on 07/02/2019)    . albuterol (PROVENTIL) (2.5 MG/3ML) 0.083% nebulizer solution Take 3 mLs (2.5 mg total) by nebulization every 6 (six) hours as needed for wheezing or shortness of breath. (Patient not taking: Reported on 04/25/2019) 75 mL 12  . arformoterol (BROVANA) 15 MCG/2ML NEBU Take 2 mLs (15 mcg total) by nebulization 2 (two) times daily. (Patient not taking: Reported on 05/16/2019) 120 mL 6  . ipratropium-albuterol (DUONEB) 0.5-2.5  (3) MG/3ML SOLN Take 3 mLs by nebulization every 4 (four) hours as needed. (Patient not taking: Reported on 04/25/2019) 360 mL 12  . lidocaine (LIDODERM) 5 % Place 1 patch onto the skin daily. Remove & Discard patch within 12 hours or as directed by MD (Patient not taking: Reported on 07/02/2019) 30 patch 0  . magic mouthwash w/lidocaine SOLN Take 5 mLs by mouth 3 (three) times daily as needed for mouth pain. (Patient not taking: Reported on 07/02/2019) 75 mL 0  . Multiple Vitamin (MULTIVITAMIN WITH MINERALS) TABS tablet Take 1 tablet by mouth daily. (Patient not taking: Reported on 07/02/2019) 30 tablet 3   No facility-administered medications prior to visit.      Review of Systems  Constitutional: Negative for fever.  HENT: Positive for rhinorrhea and sore throat. Negative for ear pain.   Respiratory: Positive for shortness of breath and wheezing. Negative for hemoptysis and sputum production.   Cardiovascular: Positive for chest pain and PND. Negative for orthopnea, claudication, leg swelling and syncope.  Gastrointestinal: Negative for abdominal pain and vomiting.  Musculoskeletal: Negative for neck pain.  Skin: Negative for rash.  Neurological: Negative for headaches.       Objective:   Physical Exam Vitals:   07/02/19 1102  BP: 129/72  Pulse: 75  Resp: 16  Temp: 98.3 F (36.8 C)  SpO2: 95%  Weight: 147 lb (66.7 kg)  Height: _0  (1.6 m)    Gen: Pleasant, well-nourished, in no distress,  normal affect  ENT: Bilateral nasal turbinate edema without purulence mouth clear,  oropharynx clear, 2+ postnasal drip  Neck: No JVD, no TMG, no carotid bruits  Lungs: No use of accessory muscles, no dullness to percussion, expired wheezes poor airflow  Cardiovascular: RRR, heart sounds normal, no murmur or gallops, no peripheral edema  Abdomen: soft and NT, no HSM,  BS normal  Musculoskeletal: No deformities, no cyanosis or clubbing  Neuro: alert, non focal  Skin: Warm, no  lesions or rashes   Pulmonary function studies from January 2020 are reviewed  X-ray images are reviewed       Assessment & Plan:  I  personally reviewed all images and lab data in the [facility] system as well as any outside material available during this office visit and agree with the  radiology impressions.   Severe persistent asthma Severe persistent asthma with ongoing flare and inability to successfully cough up secretions at this time The patient also is not adherent to her Brovana and budesonide program she is supposed to be on both of these twice daily but only takes them as needed  I explained to the patient the importance of maintenance medications that she is to take both these medicines each and every day that they only last 12 hours in the system  Also stated she could use the albuterol nebulizer as needed.  I did ask her to review her HFA technique and it was quite poor.  She is getting virtually no medications down into her lungs and I suspect this is why she is on the nebulizer patient in program  I did review with her proper HFA technique and got her to go from 5% ability up to maybe about 60%  Because of lack of adherence to her nebulized therapy will give the patient a pulse dose of prednisone for 40 mg daily for 5 days and discontinue  Cough Cyclic cough also some degree of upper airway instability  She cannot clear secretions therefore I have given her the flutter valve and instructed her as to its proper use   Diagnoses and all orders for this visit:  Severe persistent asthma with acute exacerbation  Moderate persistent asthma, unspecified whether complicated -     arformoterol (BROVANA) 15 MCG/2ML NEBU; Take 2 mLs (15 mcg total) by nebulization 2 (two) times daily. -     budesonide (PULMICORT) 0.25 MG/2ML nebulizer solution; Take 2 mLs (0.25 mg total) by nebulization 2 (two) times daily.  Cough  Other orders -     predniSONE (DELTASONE) 10 MG tablet; Take 4  tablets daily for 5 days then stop -     albuterol (PROVENTIL) (2.5 MG/3ML) 0.083% nebulizer solution; Take 3 mLs (2.5 mg total) by nebulization every 6 (six) hours as needed for wheezing or shortness of breath.

## 2019-07-02 ENCOUNTER — Encounter: Payer: Self-pay | Admitting: Critical Care Medicine

## 2019-07-02 ENCOUNTER — Other Ambulatory Visit: Payer: Self-pay

## 2019-07-02 ENCOUNTER — Ambulatory Visit: Payer: Medicare Other | Attending: Critical Care Medicine | Admitting: Critical Care Medicine

## 2019-07-02 VITALS — BP 129/72 | HR 75 | Temp 98.3°F | Resp 16 | Ht 63.0 in | Wt 147.0 lb

## 2019-07-02 DIAGNOSIS — J4551 Severe persistent asthma with (acute) exacerbation: Secondary | ICD-10-CM | POA: Diagnosis not present

## 2019-07-02 DIAGNOSIS — R05 Cough: Secondary | ICD-10-CM

## 2019-07-02 DIAGNOSIS — J4540 Moderate persistent asthma, uncomplicated: Secondary | ICD-10-CM

## 2019-07-02 DIAGNOSIS — R059 Cough, unspecified: Secondary | ICD-10-CM

## 2019-07-02 MED ORDER — BUDESONIDE 0.25 MG/2ML IN SUSP: 0.2500 mg | Freq: Two times a day (BID) | RESPIRATORY_TRACT | 12 refills | Status: DC

## 2019-07-02 MED ORDER — ALBUTEROL SULFATE (2.5 MG/3ML) 0.083% IN NEBU: 2.5000 mg | INHALATION_SOLUTION | Freq: Four times a day (QID) | RESPIRATORY_TRACT | 12 refills | Status: DC | PRN

## 2019-07-02 MED ORDER — ARFORMOTEROL TARTRATE 15 MCG/2ML IN NEBU: 15.0000 ug | INHALATION_SOLUTION | Freq: Two times a day (BID) | RESPIRATORY_TRACT | 6 refills | Status: DC

## 2019-07-02 MED ORDER — PREDNISONE 10 MG PO TABS
ORAL_TABLET | ORAL | 0 refills | Status: DC
Start: 2019-07-02 — End: 2019-07-27

## 2019-07-02 NOTE — Progress Notes (Signed)
Here for COPD F /u  Request refills on prednisone  Pt did not want to have BS check in the office today . Stated she had it done in the morning reading 140 /

## 2019-07-02 NOTE — Patient Instructions (Signed)
Use brovana and budesonide TWICE a day Use either ventolin inhaler or albuterol in nebulizer as needed every 4 hours for severe shortness of breath  Take prednisone 37m Take 4 tablets daily for 5 days then stop  Use the flutter valve 10 repetitions twice a day after each nebulizer treatment  Keep your follow up appointments with Dr IValeta Harms Dr WJoya Gaskinsis available as needed

## 2019-07-02 NOTE — Assessment & Plan Note (Addendum)
Severe persistent asthma with ongoing flare and inability to successfully cough up secretions at this time The patient also is not adherent to her Brovana and budesonide program she is supposed to be on both of these twice daily but only takes them as needed  I explained to the patient the importance of maintenance medications that she is to take both these medicines each and every day that they only last 12 hours in the system  Also stated she could use the albuterol nebulizer as needed.  I did ask her to review her HFA technique and it was quite poor.  She is getting virtually no medications down into her lungs and I suspect this is why she is on the nebulizer patient in program  I did review with her proper HFA technique and got her to go from 5% ability up to maybe about 60%  Because of lack of adherence to her nebulized therapy will give the patient a pulse dose of prednisone for 40 mg daily for 5 days and discontinue

## 2019-07-02 NOTE — Assessment & Plan Note (Signed)
Cyclic cough also some degree of upper airway instability  She cannot clear secretions therefore I have given her the flutter valve and instructed her as to its proper use

## 2019-07-03 ENCOUNTER — Ambulatory Visit: Payer: Medicare Other | Admitting: Cardiology

## 2019-07-03 ENCOUNTER — Encounter: Payer: Self-pay | Admitting: Cardiology

## 2019-07-03 VITALS — BP 163/66 | HR 70 | Resp 16 | Ht 63.0 in | Wt 144.0 lb

## 2019-07-03 DIAGNOSIS — E7800 Pure hypercholesterolemia, unspecified: Secondary | ICD-10-CM

## 2019-07-03 DIAGNOSIS — R0609 Other forms of dyspnea: Secondary | ICD-10-CM

## 2019-07-03 DIAGNOSIS — Z9889 Other specified postprocedural states: Secondary | ICD-10-CM

## 2019-07-03 DIAGNOSIS — I4821 Permanent atrial fibrillation: Secondary | ICD-10-CM

## 2019-07-03 DIAGNOSIS — I10 Essential (primary) hypertension: Secondary | ICD-10-CM

## 2019-07-03 DIAGNOSIS — I25118 Atherosclerotic heart disease of native coronary artery with other forms of angina pectoris: Secondary | ICD-10-CM

## 2019-07-03 MED ORDER — ATORVASTATIN CALCIUM 40 MG PO TABS: 40.0000 mg | ORAL_TABLET | Freq: Every day | ORAL | 3 refills | Status: DC

## 2019-07-03 MED ORDER — BISOPROLOL FUMARATE 10 MG PO TABS: 10.0000 mg | ORAL_TABLET | Freq: Every day | ORAL | 3 refills | Status: DC
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
## 2019-07-09 ENCOUNTER — Telehealth: Payer: Self-pay

## 2019-07-09 NOTE — Telephone Encounter (Signed)
-----  Message from Adrian Prows, MD sent at 07/07/2019  4:42 PM EDT ----- Regarding: Tests Please call her and schedule her for echo and lexistress. I need her to do some labs. Can she do them in 2 months or have to remind her? JG

## 2019-07-10 ENCOUNTER — Ambulatory Visit: Payer: Medicare Other

## 2019-07-10 ENCOUNTER — Other Ambulatory Visit: Payer: Self-pay

## 2019-07-10 ENCOUNTER — Ambulatory Visit: Payer: Medicare Other | Admitting: Cardiology

## 2019-07-10 DIAGNOSIS — I25118 Atherosclerotic heart disease of native coronary artery with other forms of angina pectoris: Secondary | ICD-10-CM

## 2019-07-10 DIAGNOSIS — I10 Essential (primary) hypertension: Secondary | ICD-10-CM

## 2019-07-10 DIAGNOSIS — I4821 Permanent atrial fibrillation: Secondary | ICD-10-CM

## 2019-07-10 DIAGNOSIS — Z9889 Other specified postprocedural states: Secondary | ICD-10-CM

## 2019-07-10 DIAGNOSIS — R0609 Other forms of dyspnea: Secondary | ICD-10-CM

## 2019-07-10 DIAGNOSIS — R0600 Dyspnea, unspecified: Secondary | ICD-10-CM

## 2019-07-10 DIAGNOSIS — I495 Sick sinus syndrome: Secondary | ICD-10-CM

## 2019-07-10 NOTE — Progress Notes (Signed)
ICD-10-CM   1. Tachycardia-bradycardia syndrome (HCC)  I49.5 bisoprolol (ZEBETA) 10 MG tablet  2. Dyspnea on exertion  R06.00 EKG 12-Lead  3. Permanent atrial fibrillation ([place]).  CHA2DS2-VASc Score is 4.  Yearly risk of stroke: 4% (A,F, HTN, Vasc Dz).   I48.21   4. Coronary artery disease of native artery of native heart with stable angina pectoris ([place])  I25.118 bisoprolol (ZEBETA) 10 MG tablet  5. Primary hypertension  I10 bisoprolol (ZEBETA) 10 MG tablet    EKG 07/10/2019: Atrial fibrillation with slow ventricular response at the rate of 41 bpm, normal axis, no evidence of ischemia.  Normal QT interval.    Medications Discontinued During This Encounter  Medication Reason  . bisoprolol (ZEBETA) 10 MG tablet     Changed Bisoprolol to 5 mg due to bradycardia.  D/W Dr. Terri Skains.    Adrian Prows, MD, [facility] 07/10/2019, 9:32 PM Office: [phone]

## 2019-07-16 NOTE — Progress Notes (Signed)
Severe pulmonary hypertension and will need right heart catheterization and will discuss on OV

## 2019-07-16 NOTE — Progress Notes (Signed)
Severe PH and may need Right Heart Cath. Will keep you in the loop

## 2019-07-20 ENCOUNTER — Telehealth: Payer: Self-pay

## 2019-07-20 NOTE — Telephone Encounter (Signed)
I have discussed with the patient's daughter regarding markedly abnormal echocardiogram revealing severe pulmonary hypertension.  I had also scheduled the patient for nuclear stress test, patient does not want to have a nuclear stress test as per patient's daughter as she feels very uncomfortable with palpitations during Lexiscan infusion.  Advised her that she needs a heart catheterization in view of worsening symptoms of dyspnea and also severe pulmonary hypertension.  She has an appointment to see me back in the office on 07/26/2019 and will discuss further.   Adrian Prows, MD, [facility] 07/20/2019, 2:39 PM Office: [phone]

## 2019-07-23 ENCOUNTER — Other Ambulatory Visit: Payer: Self-pay | Admitting: Nurse Practitioner

## 2019-07-23 NOTE — Telephone Encounter (Signed)
Requested medication (s) are due for refill today unknown  Requested medication (s) are on the active medication list -yes  Future visit scheduled -no  Last refill: 1year  Notes to clinic: Request for Rx refill - filled by outside provider(ED)  Requested Prescriptions  Pending Prescriptions Disp Refills   ferrous sulfate 325 (65 FE) MG tablet 90 tablet 3    Sig: Take 1 tablet (325 mg total) by mouth 3 (three) times daily with meals.      Endocrinology:  Minerals - Iron Supplementation Failed - 07/23/2019  4:14 PM      Failed - Fe (serum) in normal range and within 360 days    Iron  Date Value Ref Range Status  09/25/2017 31 28 - 170 ug/dL Final   Saturation Ratios  Date Value Ref Range Status  09/25/2017 8 (L) 10.4 - 31.8 % Final          Failed - Ferritin in normal range and within 360 days    Ferritin  Date Value Ref Range Status  09/25/2017 37 11 - 307 ng/mL Final    Comment:    Performed at [facility], [place] [address], [place], [place] [address]          Passed - HGB in normal range and within 360 days    Hemoglobin  Date Value Ref Range Status  06/05/2019 13.0 11.1 - 15.9 g/dL Final          Passed - HCT in normal range and within 360 days    Hematocrit  Date Value Ref Range Status  06/05/2019 40.8 34.0 - 46.6 % Final          Passed - RBC in normal range and within 360 days    RBC  Date Value Ref Range Status  06/05/2019 5.26 3.77 - 5.28 x10E6/uL Final  05/15/2019 5.24 (H) 3.87 - 5.11 MIL/uL Final          Passed - Valid encounter within last 12 months    Recent Outpatient Visits           3 weeks ago Severe persistent asthma with acute exacerbation   [place], MD   1 month ago Type 2 diabetes mellitus with hyperglycemia, without long-term current use of insulin [facility])   [place], Vernia Buff, NP   2 months ago Encounter to establish care   [place], Vernia Buff, NP       Future Appointments             In 4 days Adrian Prows, MD [facility]                Requested Prescriptions  Pending Prescriptions Disp Refills   ferrous sulfate 325 (65 FE) MG tablet 90 tablet 3    Sig: Take 1 tablet (325 mg total) by mouth 3 (three) times daily with meals.      Endocrinology:  Minerals - Iron Supplementation Failed - 07/23/2019  4:14 PM      Failed - Fe (serum) in normal range and within 360 days    Iron  Date Value Ref Range Status  09/25/2017 31 28 - 170 ug/dL Final   Saturation Ratios  Date Value Ref Range Status  09/25/2017 8 (L) 10.4 - 31.8 % Final          Failed - Ferritin in normal range and  within 360 days    Ferritin  Date Value Ref Range Status  09/25/2017 37 11 - 307 ng/mL Final    Comment:    Performed at [facility] [address], [place] [address]          Passed - HGB in normal range and within 360 days    Hemoglobin  Date Value Ref Range Status  06/05/2019 13.0 11.1 - 15.9 g/dL Final          Passed - HCT in normal range and within 360 days    Hematocrit  Date Value Ref Range Status  06/05/2019 40.8 34.0 - 46.6 % Final          Passed - RBC in normal range and within 360 days    RBC  Date Value Ref Range Status  06/05/2019 5.26 3.77 - 5.28 x10E6/uL Final  05/15/2019 5.24 (H) 3.87 - 5.11 MIL/uL Final          Passed - Valid encounter within last 12 months    Recent Outpatient Visits           3 weeks ago Severe persistent asthma with acute exacerbation   [place], MD   1 month ago Type 2 diabetes mellitus with hyperglycemia, without long-term current use of insulin [facility])   [place], Vernia Buff, NP   2 months ago Encounter to establish care   [place], Vernia Buff, NP       Future  Appointments             In 4 days Adrian Prows, MD [facility]

## 2019-07-23 NOTE — Telephone Encounter (Signed)
Medication Refill - Medication: ferrous sulfate 325 (65 FE) MG tablet    Preferred Pharmacy (with phone number or street name):  [id number] [place] [place]:  [phone] Fax:  [phone]      Agent: Please be advised that RX refills may take up to 3 business days. We ask that you follow-up with your pharmacy.

## 2019-07-24 MED ORDER — FERROUS SULFATE 325 (65 FE) MG PO TABS: 325.0000 mg | ORAL_TABLET | Freq: Three times a day (TID) | ORAL | 3 refills | Status: DC

## 2019-07-26 ENCOUNTER — Other Ambulatory Visit: Payer: Self-pay

## 2019-07-26 ENCOUNTER — Telehealth: Payer: Medicare Other | Admitting: Physician Assistant

## 2019-07-26 ENCOUNTER — Ambulatory Visit: Payer: Self-pay | Admitting: *Deleted

## 2019-07-26 DIAGNOSIS — R05 Cough: Secondary | ICD-10-CM | POA: Diagnosis not present

## 2019-07-26 DIAGNOSIS — J4551 Severe persistent asthma with (acute) exacerbation: Secondary | ICD-10-CM

## 2019-07-26 DIAGNOSIS — R059 Cough, unspecified: Secondary | ICD-10-CM

## 2019-07-26 MED ORDER — AZITHROMYCIN 250 MG PO TABS: ORAL_TABLET | ORAL | 0 refills | Status: DC

## 2019-07-26 NOTE — Patient Instructions (Addendum)
Continue using breathing treatments as directed and current medications.  Trial of azithromycin sent to your pharmacy, continue resting, proper hydration.  If you do not feel you have improved by the beginning of the week, please feel free to follow-up at the mobile unit.  Kennieth Rad, PA-C Physician Assistant [facility] [email or web address]

## 2019-07-26 NOTE — Telephone Encounter (Signed)
Spoke to pt. Advised no availability in office today. Pt states will go to mobile unit or UC.

## 2019-07-26 NOTE — Progress Notes (Signed)
Established Patient Office Visit  Subjective:  Patient ID: Sharon Spears, female    DOB: 1945-11-19  Age: 74 y.o. MRN: [id number]  CC:  Chief Complaint  Patient presents with  . URI    Virtual Visit via Telephone Note  I connected with [name] on 07/26/19 at 10:00 AM EDT by telephone and verified that I am speaking with the correct person using two identifiers.  Location: Patient: Home Provider: [facility]   I discussed the limitations, risks, security and privacy concerns of performing an evaluation and management service by telephone and the availability of in person appointments. I also discussed with the patient that there may be a patient responsible charge related to this service. The patient expressed understanding and agreed to proceed.   History of Present Illness:  HPI Sharon Spears reports that she has been having a productive cough with white phlegm for the past week, states that she is having difficulty expectorating.  Endorses shortness of breath and wheezing.  Reports she is using her in nebulizer twice a day with a little relief.  Reports cough is keeping her up at night.  Denies fevers, chills, or any other URI symptoms, denies sick contacts.     Observations/Objective:  Patient history and current medication reviewed, no physical exam completed   Past Medical History:  Diagnosis Date  . Adjustment disorder with anxiety   . Asthma   . Atrial fibrillation ([place])   . Benign essential hypertension   . COPD (chronic obstructive pulmonary disease) ([place])   . Coronary artery disease   . Diabetic neuropathic arthritis ([place])   . Heart palpitations   . Hypoxia 07/19/2018  . Other cirrhosis of liver ([place]) 07/20/2018  . Uncontrolled diabetes mellitus type 2 without complications     Past Surgical History:  Procedure Laterality Date  . CARDIAC SURGERY    . CESAREAN SECTION    . CORONARY ARTERY BYPASS GRAFT  07/06/2017    Redo-sternotomy and CABG 2 with LIMA to LAD, SVG to OM, and a a closure, mitral valve repair with #30 mm [place] ring on 07/06/2017  . ESOPHAGEAL MANOMETRY N/A 03/06/2012   Procedure: ESOPHAGEAL MANOMETRY (EM);  Surgeon: Beryle Beams, MD;  Location: [facility];  Service: Endoscopy;  Laterality: N/A;  . IR ANGIOGRAM SELECTIVE EACH ADDITIONAL VESSEL  02/16/2019  . IR ANGIOGRAM SELECTIVE EACH ADDITIONAL VESSEL  02/16/2019  . IR ANGIOGRAM SELECTIVE EACH ADDITIONAL VESSEL  02/16/2019  . IR ANGIOGRAM SELECTIVE EACH ADDITIONAL VESSEL  02/16/2019  . IR RENAL SELECTIVE  UNI INC S&I MOD SED  02/16/2019  . IR US GUIDE VASC ACCESS RIGHT  02/16/2019  . TONSILLECTOMY      Family History  Problem Relation Age of Onset  . Asthma Mother   . Alcohol abuse Father     Social History   Socioeconomic History  . Marital status: Legally Separated    Spouse name: Not on file  . Number of children: 4  . Years of education: 108  . Highest education level: Not on file  Occupational History  . Occupation: Retired  Tobacco Use  . Smoking status: Never Smoker  . Smokeless tobacco: Never Used  Vaping Use  . Vaping Use: Never used  Substance and Sexual Activity  . Alcohol use: No  . Drug use: Never  . Sexual activity: Yes  Other Topics Concern  . Not on file  Social History Narrative   Fun/Hobby: Gardening    Denies abuse and feels  safe at home.    1 son deceased   Social Determinants of Radio broadcast assistant Strain:   . Difficulty of Paying Living Expenses:   Food Insecurity:   . Worried About Charity fundraiser in the Last Year:   . Arboriculturist in the Last Year:   Transportation Needs:   . Film/video editor (Medical):   Marland Kitchen Lack of Transportation (Non-Medical):   Physical Activity:   . Days of Exercise per Week:   . Minutes of Exercise per Session:   Stress:   . Feeling of Stress :   Social Connections:   . Frequency of Communication with Friends and Family:   . Frequency of  Social Gatherings with Friends and Family:   . Attends Religious Services:   . Active Member of Clubs or Organizations:   . Attends Archivist Meetings:   Marland Kitchen Marital Status:   Intimate Partner Violence:   . Fear of Current or Ex-Partner:   . Emotionally Abused:   Marland Kitchen Physically Abused:   . Sexually Abused:     Outpatient Medications Prior to Visit  Medication Sig Dispense Refill  . acetaminophen (TYLENOL) 325 MG tablet Take 2 tablets (650 mg total) by mouth every 6 (six) hours as needed for fever or headache. 30 tablet 0  . albuterol (PROVENTIL) (2.5 MG/3ML) 0.083% nebulizer solution Take 3 mLs (2.5 mg total) by nebulization every 6 (six) hours as needed for wheezing or shortness of breath. 75 mL 12  . albuterol (VENTOLIN HFA) 108 (90 Base) MCG/ACT inhaler INHALE 2 PUFFS INTO THE LUNGS EVERY 6 HOURS AS NEEDED FOR WHEEZE 18 g 3  . apixaban (ELIQUIS) 5 MG TABS tablet Take 1 tablet (5 mg total) by mouth 2 (two) times daily. 60 tablet 3  . arformoterol (BROVANA) 15 MCG/2ML NEBU Take 2 mLs (15 mcg total) by nebulization 2 (two) times daily. 120 mL 6  . atorvastatin (LIPITOR) 40 MG tablet Take 1 tablet (40 mg total) by mouth daily. 90 tablet 3  . bisoprolol (ZEBETA) 10 MG tablet Take 0.5 tablets (5 mg total) by mouth daily. 30 tablet 3  . Blood Glucose Monitoring Suppl (ACCU-CHEK AVIVA PLUS) w/Device KIT USE TO CHECK BLOOD SUGARS ONCE DAILY    . budesonide (PULMICORT) 0.25 MG/2ML nebulizer solution Take 2 mLs (0.25 mg total) by nebulization 2 (two) times daily. 120 mL 12  . cetirizine (ZYRTEC) 10 MG tablet Take 1 tablet (10 mg total) by mouth daily. 30 tablet 6  . diphenhydrAMINE (BENADRYL) 12.5 MG/5ML elixir Take 12.5 mg by mouth daily as needed.     Marland Kitchen escitalopram (LEXAPRO) 10 MG tablet Take 1 tablet (10 mg total) by mouth daily. 90 tablet 1  . ferrous sulfate 325 (65 FE) MG tablet Take 1 tablet (325 mg total) by mouth 3 (three) times daily with meals. 90 tablet 3  . furosemide (LASIX)  20 MG tablet Take 1 tablet (20 mg total) by mouth 2 (two) times daily.    Marland Kitchen glucose blood (ACCU-CHEK AVIVA PLUS) test strip USE WITH GLUCOMETER TO CHECK BLOOD SUGARS ONCE DAILY    . isosorbide mononitrate (IMDUR) 30 MG 24 hr tablet Take 1 tablet by mouth as needed.     Marland Kitchen losartan (COZAAR) 100 MG tablet Take 1 tablet (100 mg total) by mouth daily. 90 tablet 1  . metFORMIN (GLUCOPHAGE XR) 500 MG 24 hr tablet Take 1 tablet (500 mg total) by mouth daily with breakfast. 90 tablet 0  .  nitroGLYCERIN (NITROSTAT) 0.4 MG SL tablet Place 0.4 mg under the tongue every 5 (five) minutes as needed for chest pain.     . pantoprazole (PROTONIX) 40 MG tablet Take 1 tablet daily before first meal 90 tablet 1  . predniSONE (DELTASONE) 10 MG tablet Take 4 tablets daily for 5 days then stop 20 tablet 0   No facility-administered medications prior to visit.    Allergies  Allergen Reactions  . Contrast Media [Iodinated Diagnostic Agents] Anaphylaxis    Per pt she had anaphylaxis reaction to contrast media in the past. States she couldn't breathe and they had to give her epi.   . Adhesive [Tape] Itching  . Penicillins Other (See Comments)    GI upset Has patient had a PCN reaction causing immediate rash, facial/tongue/throat swelling, SOB or lightheadedness with hypotension: No Has patient had a PCN reaction causing severe rash involving mucus membranes or skin necrosis: No Has patient had a PCN reaction that required hospitalization: No Has patient had a PCN reaction occurring within the last 10 years: No If all of the above answers are "NO", then may proceed with Cephalosporin use.    ROS Review of Systems  Constitutional: Negative for chills, fatigue and fever.  HENT: Negative for ear pain, postnasal drip and sore throat.   Eyes: Negative.   Respiratory: Positive for cough, shortness of breath and wheezing.   Cardiovascular: Negative for chest pain.  Gastrointestinal: Negative.   Endocrine: Negative.    Genitourinary: Negative.   Musculoskeletal: Negative.   Skin: Negative.   Allergic/Immunologic: Negative.   Neurological: Negative.   Hematological: Negative.   Psychiatric/Behavioral: Negative.       Objective:     There were no vitals taken for this visit. Wt Readings from Last 3 Encounters:  07/03/19 144 lb (65.3 kg)  07/02/19 147 lb (66.7 kg)  06/05/19 147 lb (66.7 kg)     Health Maintenance Due  Topic Date Due  . FOOT EXAM  Never done  . TETANUS/TDAP  Never done  . COLONOSCOPY  Never done  . DEXA SCAN  Never done  . MAMMOGRAM  05/30/2014  . OPHTHALMOLOGY EXAM  06/11/2016    There are no preventive care reminders to display for this patient.  Lab Results  Component Value Date   TSH 1.407 09/25/2017   Lab Results  Component Value Date   WBC 6.0 06/05/2019   HGB 13.0 06/05/2019   HCT 40.8 06/05/2019   MCV 78 (L) 06/05/2019   PLT 159 06/05/2019   Lab Results  Component Value Date   NA 141 06/05/2019   K 4.3 06/05/2019   CO2 22 06/05/2019   GLUCOSE 115 (H) 06/05/2019   BUN 23 06/05/2019   CREATININE 0.87 06/05/2019   BILITOT 0.6 06/05/2019   ALKPHOS 96 06/05/2019   AST 40 06/05/2019   ALT 41 (H) 06/05/2019   PROT 7.4 06/05/2019   ALBUMIN 4.4 06/05/2019   CALCIUM 9.5 06/05/2019   ANIONGAP 14 05/15/2019   GFR 85.17 06/02/2016   Lab Results  Component Value Date   CHOL 199 06/05/2019   Lab Results  Component Value Date   HDL 55 06/05/2019   Lab Results  Component Value Date   LDLCALC 123 (H) 06/05/2019   Lab Results  Component Value Date   TRIG 118 06/05/2019   Lab Results  Component Value Date   CHOLHDL 3.6 06/05/2019   Lab Results  Component Value Date   HGBA1C 6.9 (A) 06/05/2019  Assessment & Plan:   Problem List Items Addressed This Visit      Respiratory   Severe persistent asthma - Primary   Relevant Medications   azithromycin (ZITHROMAX) 250 MG tablet     Other   Cough     Assessment and Plan: 1. Severe  persistent asthma with acute exacerbation Continue current regimen, trial azithromycin, encouraged rest and hydration.  Red flags given for prompt evaluation at emergency department, encouraged patient to follow-up in mobile unit next week if no improvement. - azithromycin (ZITHROMAX) 250 MG tablet; Take 2 tabs PO day 1, then take 1 tab daily  Dispense: 6 tablet; Refill: 0  2. Cough    Follow Up Instructions:    I discussed the assessment and treatment plan with the patient. The patient was provided an opportunity to ask questions and all were answered. The patient agreed with the plan and demonstrated an understanding of the instructions.   The patient was advised to call back or seek an in-person evaluation if the symptoms worsen or if the condition fails to improve as anticipated.  I provided 22 minutes of non-face-to-face time during this encounter.   Bethanne Mule S Mayers, PA-C   Meds ordered this encounter  Medications  . azithromycin (ZITHROMAX) 250 MG tablet    Sig: Take 2 tabs PO day 1, then take 1 tab daily    Dispense:  6 tablet    Refill:  0    Order Specific Question:   Supervising Provider    Answer:   Elsie Stain [[id number]]    Follow-up: No follow-ups on file.    Loraine Grip Mayers, PA-C

## 2019-07-26 NOTE — Telephone Encounter (Signed)
Patient states the humidity is bothering her. She has been using nebulizer and other asthma medications and they are not helping. Patient has developed a cough that is causing her to have increased problems- cough has been present for 1 week. Patient states she sleeps propped up and has been doing this for months. Call to office to see if she can be scheduled- patient advised if she can not be seen at office she needs to be seen at UC/ED. Patient states she does not want to go to UC/ED because they do not help her.  Reason for Disposition . [1] Longstanding difficulty breathing (e.g., CHF, COPD, emphysema) AND [2] WORSE than normal  Answer Assessment - Initial Assessment Questions 1. RESPIRATORY STATUS: "Describe your breathing?" (e.g., wheezing, shortness of breath, unable to speak, severe coughing)      Coughing a lot- phelm not coming out, chest is sore from coughing- patient has had COVID vaccines 2. ONSET: "When did this breathing problem begin?"      1 week 3. PATTERN "Does the difficult breathing come and go, or has it been constant since it started?"      Coughing causes breathing problems 4. SEVERITY: "How bad is your breathing?" (e.g., mild, moderate, severe)    - MILD: No SOB at rest, mild SOB with walking, speaks normally in sentences, can lay down, no retractions, pulse < 100.    - MODERATE: SOB at rest, SOB with minimal exertion and prefers to sit, cannot lie down flat, speaks in phrases, mild retractions, audible wheezing, pulse 100-120.    - SEVERE: Very SOB at rest, speaks in single words, struggling to breathe, sitting hunched forward, retractions, pulse > 120      moderate 5. RECURRENT SYMPTOM: "Have you had difficulty breathing before?" If Yes, ask: "When was the last time?" and "What happened that time?"      Yes- history of pulmonary problems 6. CARDIAC HISTORY: "Do you have any history of heart disease?" (e.g., heart attack, angina, bypass surgery, angioplasty)       Yes-heart surgery 7. LUNG HISTORY: "Do you have any history of lung disease?"  (e.g., pulmonary embolus, asthma, emphysema)     asthma 8. CAUSE: "What do you think is causing the breathing problem?"      asthma/cough 9. OTHER SYMPTOMS: "Do you have any other symptoms? (e.g., dizziness, runny nose, cough, chest pain, fever)     Cough, chest sore 10. PREGNANCY: "Is there any chance you are pregnant?" "When was your last menstrual period?"       n/a 11. TRAVEL: "Have you traveled out of the country in the last month?" (e.g., travel history, exposures)       no  Protocols used: BREATHING DIFFICULTY-A-AH

## 2019-07-27 ENCOUNTER — Encounter: Payer: Self-pay | Admitting: Cardiology

## 2019-07-27 ENCOUNTER — Ambulatory Visit: Payer: Medicare Other | Admitting: Cardiology

## 2019-07-27 VITALS — BP 154/60 | HR 57 | Resp 16 | Ht 63.0 in | Wt 151.2 lb

## 2019-07-27 DIAGNOSIS — R0609 Other forms of dyspnea: Secondary | ICD-10-CM

## 2019-07-27 DIAGNOSIS — I495 Sick sinus syndrome: Secondary | ICD-10-CM

## 2019-07-27 DIAGNOSIS — E7800 Pure hypercholesterolemia, unspecified: Secondary | ICD-10-CM

## 2019-07-27 DIAGNOSIS — I272 Pulmonary hypertension, unspecified: Secondary | ICD-10-CM

## 2019-07-27 DIAGNOSIS — I25118 Atherosclerotic heart disease of native coronary artery with other forms of angina pectoris: Secondary | ICD-10-CM

## 2019-07-27 DIAGNOSIS — I4821 Permanent atrial fibrillation: Secondary | ICD-10-CM

## 2019-07-27 DIAGNOSIS — I10 Essential (primary) hypertension: Secondary | ICD-10-CM
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
## 2019-07-27 NOTE — Patient Instructions (Signed)
Hold Eliquis just previous night and day of the test.

## 2019-07-27 NOTE — Telephone Encounter (Signed)
Agree with recommendations.
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
## 2019-07-30 ENCOUNTER — Other Ambulatory Visit: Payer: Self-pay | Admitting: Cardiology

## 2019-07-31 ENCOUNTER — Encounter: Payer: Self-pay | Admitting: Cardiology

## 2019-07-31 LAB — COMPLETE METABOLIC PANEL WITH GFR
AG Ratio: 1.3 (calc) (ref 1.0–2.5)
ALT: 27 U/L (ref 6–29)
AST: 29 U/L (ref 10–35)
Albumin: 4 g/dL (ref 3.6–5.1)
Alkaline phosphatase (APISO): 71 U/L (ref 37–153)
BUN: 24 mg/dL (ref 7–25)
CO2: 28 mmol/L (ref 20–32)
Calcium: 9.1 mg/dL (ref 8.6–10.4)
Chloride: 105 mmol/L (ref 98–110)
Creat: 0.79 mg/dL (ref 0.60–0.93)
GFR, Est African American: 87 mL/min/{1.73_m2} (ref >=60)
GFR, Est Non African American: 75 mL/min/{1.73_m2} (ref >=60)
Globulin: 3.1 g/dL (calc) (ref 1.9–3.7)
Glucose, Bld: 154 mg/dL — ABNORMAL HIGH (ref 65–99)
Potassium: 3.9 mmol/L (ref 3.5–5.3)
Sodium: 140 mmol/L (ref 135–146)
Total Bilirubin: 0.9 mg/dL (ref 0.2–1.2)
Total Protein: 7.1 g/dL (ref 6.1–8.1)

## 2019-07-31 LAB — CBC
HCT: 41.8 % (ref 35.0–45.0)
Hemoglobin: 13.3 g/dL (ref 11.7–15.5)
MCH: 26 pg — ABNORMAL LOW (ref 27.0–33.0)
MCHC: 31.8 g/dL — ABNORMAL LOW (ref 32.0–36.0)
MCV: 81.8 fL (ref 80.0–100.0)
MPV: 12.7 fL — ABNORMAL HIGH (ref 7.5–12.5)
Platelets: 184 10*3/uL (ref 140–400)
RBC: 5.11 10*6/uL — ABNORMAL HIGH (ref 3.80–5.10)
RDW: 16.4 % — ABNORMAL HIGH (ref 11.0–15.0)
WBC: 5.6 10*3/uL (ref 3.8–10.8)

## 2019-07-31 LAB — LIPID PANEL
Cholesterol: 134 mg/dL (ref <200)
HDL: 41 mg/dL — ABNORMAL LOW (ref >=50)
LDL Cholesterol (Calc): 73 mg/dL (calc)
Non-HDL Cholesterol (Calc): 93 mg/dL (calc) (ref <130)
Total CHOL/HDL Ratio: 3.3 (calc) (ref <5.0)
Triglycerides: 110 mg/dL (ref <150)

## 2019-07-31 LAB — TSH: TSH: 1.44 mIU/L (ref 0.40–4.50)

## 2019-08-01 ENCOUNTER — Telehealth: Payer: Self-pay | Admitting: Nurse Practitioner

## 2019-08-01 NOTE — Telephone Encounter (Signed)
Copied from [place] [id number]. Topic: General - Other >> Jul 31, 2019  3:47 PM Leward Quan A wrote: Reason for CRM: Forrest Moron with [facility] called to request a call back from a nurse to discuss patient medications that she should be taking Please call Forrest Moron at Ph# [phone] >> Jul 31, 2019  5:33 PM Yvette Rack wrote: [facility] requests that patient medical records be faxed to [phone] along with her most recent labs and office notes.

## 2019-08-02 ENCOUNTER — Ambulatory Visit: Payer: Self-pay | Admitting: *Deleted

## 2019-08-02 NOTE — Telephone Encounter (Signed)
Sharon Glad, NP with [facility] visits with her monthly.   Associated with Medicare.   She makes sure the pt is taking her medications and going to her dr visit, etc. She called in to go over pt's list of medications. She had Lexapro and amlodipine on her list which was not listed in pt's chart.  "I will follow up on that".   She thanked me very much for my assistance.    Answer Assessment - Initial Assessment Questions 1. NAME of MEDICATION: "What medicine are you calling about?"     Sharon Spears, nurse,NP sees pt monthly in the home. 2. QUESTION: "What is your question?" (e.g., medication refill, side effect)     *No Answer* 3. PRESCRIBING HCP: "Who prescribed it?" Reason: if prescribed by specialist, call should be referred to that group.     *No Answer* 4. SYMPTOMS: "Do you have any symptoms?"     *No Answer* 5. SEVERITY: If symptoms are present, ask "Are they mild, moderate or severe?"     *No Answer* 6. PREGNANCY:  "Is there any chance that you are pregnant?" "When was your last menstrual period?"     *No Answer*  Protocols used: MEDICATION QUESTION CALL-A-AH

## 2019-08-07 ENCOUNTER — Encounter (HOSPITAL_COMMUNITY)
Admission: RE | Disposition: A | Discharge status: Home / Self Care | Payer: Self-pay | Source: Ambulatory Visit | Attending: Cardiology

## 2019-08-07 ENCOUNTER — Ambulatory Visit (HOSPITAL_COMMUNITY)
Admission: RE | Admit: 2019-08-07 | Discharge: 2019-08-07 | Disposition: A | Discharge status: Home / Self Care | Payer: Medicare Other | Source: Ambulatory Visit | Attending: Cardiology | Admitting: Cardiology

## 2019-08-07 DIAGNOSIS — E1140 Type 2 diabetes mellitus with diabetic neuropathy, unspecified: Secondary | ICD-10-CM | POA: Diagnosis not present

## 2019-08-07 DIAGNOSIS — F4322 Adjustment disorder with anxiety: Secondary | ICD-10-CM | POA: Diagnosis not present

## 2019-08-07 DIAGNOSIS — E7800 Pure hypercholesterolemia, unspecified: Secondary | ICD-10-CM | POA: Diagnosis not present

## 2019-08-07 DIAGNOSIS — Z7901 Long term (current) use of anticoagulants: Secondary | ICD-10-CM | POA: Insufficient documentation

## 2019-08-07 DIAGNOSIS — Z951 Presence of aortocoronary bypass graft: Secondary | ICD-10-CM | POA: Diagnosis not present

## 2019-08-07 DIAGNOSIS — E119 Type 2 diabetes mellitus without complications: Secondary | ICD-10-CM | POA: Insufficient documentation

## 2019-08-07 DIAGNOSIS — Z91041 Radiographic dye allergy status: Secondary | ICD-10-CM | POA: Diagnosis not present

## 2019-08-07 DIAGNOSIS — J449 Chronic obstructive pulmonary disease, unspecified: Secondary | ICD-10-CM | POA: Insufficient documentation

## 2019-08-07 DIAGNOSIS — K7460 Unspecified cirrhosis of liver: Secondary | ICD-10-CM | POA: Insufficient documentation

## 2019-08-07 DIAGNOSIS — I4821 Permanent atrial fibrillation: Secondary | ICD-10-CM | POA: Diagnosis not present

## 2019-08-07 DIAGNOSIS — I10 Essential (primary) hypertension: Secondary | ICD-10-CM | POA: Insufficient documentation

## 2019-08-07 DIAGNOSIS — R0609 Other forms of dyspnea: Secondary | ICD-10-CM | POA: Diagnosis not present

## 2019-08-07 DIAGNOSIS — Z79899 Other long term (current) drug therapy: Secondary | ICD-10-CM | POA: Diagnosis not present

## 2019-08-07 DIAGNOSIS — I272 Pulmonary hypertension, unspecified: Secondary | ICD-10-CM | POA: Diagnosis not present

## 2019-08-07 DIAGNOSIS — Z7984 Long term (current) use of oral hypoglycemic drugs: Secondary | ICD-10-CM | POA: Insufficient documentation

## 2019-08-07 DIAGNOSIS — I25118 Atherosclerotic heart disease of native coronary artery with other forms of angina pectoris: Secondary | ICD-10-CM | POA: Diagnosis present

## 2019-08-07 DIAGNOSIS — E1165 Type 2 diabetes mellitus with hyperglycemia: Secondary | ICD-10-CM

## 2019-08-07 DIAGNOSIS — Z880 Allergy status to penicillin: Secondary | ICD-10-CM | POA: Diagnosis not present

## 2019-08-07 DIAGNOSIS — Z91048 Other nonmedicinal substance allergy status: Secondary | ICD-10-CM | POA: Insufficient documentation

## 2019-08-07 DIAGNOSIS — E785 Hyperlipidemia, unspecified: Secondary | ICD-10-CM | POA: Insufficient documentation

## 2019-08-07 DIAGNOSIS — F329 Major depressive disorder, single episode, unspecified: Secondary | ICD-10-CM | POA: Diagnosis not present

## 2019-08-07 HISTORY — PX: RIGHT/LEFT HEART CATH AND CORONARY/GRAFT ANGIOGRAPHY: CATH118267

## 2019-08-07 LAB — GLUCOSE, CAPILLARY: Glucose-Capillary: 121 mg/dL — ABNORMAL HIGH (ref 70–99)

## 2019-08-07 LAB — POCT I-STAT EG7
Acid-Base Excess: 1 mmol/L (ref 0.0–2.0)
Bicarbonate: 26.8 mmol/L (ref 20.0–28.0)
Calcium, Ion: 1.13 mmol/L — ABNORMAL LOW (ref 1.15–1.40)
HCT: 37 % (ref 36.0–46.0)
Hemoglobin: 12.6 g/dL (ref 12.0–15.0)
O2 Saturation: 72 %
Potassium: 3.4 mmol/L — ABNORMAL LOW (ref 3.5–5.1)
Sodium: 144 mmol/L (ref 135–145)
TCO2: 28 mmol/L (ref 22–32)
pCO2, Ven: 45.4 mmHg (ref 44.0–60.0)
pH, Ven: 7.38 (ref 7.250–7.430)
pO2, Ven: 39 mmHg (ref 32.0–45.0)

## 2019-08-07 SURGERY — RIGHT/LEFT HEART CATH AND CORONARY/GRAFT ANGIOGRAPHY
Anesthesia: LOCAL

## 2019-08-07 MED ORDER — ASPIRIN 81 MG PO CHEW
81.0000 mg | CHEWABLE_TABLET | ORAL | Status: AC
  Administered 2019-08-07: 81 mg via ORAL
  Filled 2019-08-07: qty 1

## 2019-08-07 MED ORDER — DIPHENHYDRAMINE HCL 25 MG PO CAPS: 50.0000 mg | ORAL_CAPSULE | Freq: Once | ORAL | Status: AC

## 2019-08-07 MED ORDER — IOHEXOL 350 MG/ML SOLN
INTRAVENOUS | Status: DC | PRN
  Administered 2019-08-07: 80 mL via INTRA_ARTERIAL

## 2019-08-07 MED ORDER — HEPARIN SODIUM (PORCINE) 1000 UNIT/ML IJ SOLN
INTRAMUSCULAR | Status: AC
  Filled 2019-08-07: qty 1

## 2019-08-07 MED ORDER — ONDANSETRON HCL 4 MG/2ML IJ SOLN
INTRAMUSCULAR | Status: DC | PRN
  Administered 2019-08-07: 4 mg via INTRAVENOUS

## 2019-08-07 MED ORDER — METHYLPREDNISOLONE SODIUM SUCC 125 MG IJ SOLR
125.0000 mg | Freq: Once | INTRAMUSCULAR | Status: AC
  Administered 2019-08-07: 125 mg via INTRAVENOUS
  Filled 2019-08-07: qty 2

## 2019-08-07 MED ORDER — SODIUM CHLORIDE 0.9 % IV SOLN: 250.0000 mL | INTRAVENOUS | Status: DC | PRN

## 2019-08-07 MED ORDER — ASPIRIN 81 MG PO CHEW: 81.0000 mg | CHEWABLE_TABLET | ORAL | Status: DC

## 2019-08-07 MED ORDER — SODIUM CHLORIDE 0.9 % WEIGHT BASED INFUSION: 1.0000 mL/kg/h | INTRAVENOUS | Status: AC

## 2019-08-07 MED ORDER — SODIUM CHLORIDE 0.9% FLUSH: 3.0000 mL | Freq: Two times a day (BID) | INTRAVENOUS | Status: DC

## 2019-08-07 MED ORDER — SODIUM CHLORIDE 0.9% FLUSH: 3.0000 mL | INTRAVENOUS | Status: DC | PRN

## 2019-08-07 MED ORDER — HEPARIN (PORCINE) IN NACL 1000-0.9 UT/500ML-% IV SOLN
INTRAVENOUS | Status: AC
  Filled 2019-08-07: qty 1000

## 2019-08-07 MED ORDER — ACETAMINOPHEN 325 MG PO TABS: 650.0000 mg | ORAL_TABLET | ORAL | Status: DC | PRN

## 2019-08-07 MED ORDER — DIPHENHYDRAMINE HCL 50 MG/ML IJ SOLN
50.0000 mg | Freq: Once | INTRAMUSCULAR | Status: AC
  Administered 2019-08-07: 50 mg via INTRAVENOUS
  Filled 2019-08-07: qty 1

## 2019-08-07 MED ORDER — MIDAZOLAM HCL 2 MG/2ML IJ SOLN
INTRAMUSCULAR | Status: AC
  Filled 2019-08-07: qty 2

## 2019-08-07 MED ORDER — HEPARIN SODIUM (PORCINE) 1000 UNIT/ML IJ SOLN
INTRAMUSCULAR | Status: DC | PRN
  Administered 2019-08-07: 4000 [IU] via INTRAVENOUS

## 2019-08-07 MED ORDER — METFORMIN HCL ER 500 MG PO TB24: 500.0000 mg | ORAL_TABLET | Freq: Every day | ORAL | 0 refills | Status: DC

## 2019-08-07 MED ORDER — VERAPAMIL HCL 2.5 MG/ML IV SOLN
INTRAVENOUS | Status: DC | PRN
  Administered 2019-08-07: 10 mL via INTRA_ARTERIAL

## 2019-08-07 MED ORDER — LIDOCAINE HCL (PF) 1 % IJ SOLN
INTRAMUSCULAR | Status: AC
  Filled 2019-08-07: qty 30

## 2019-08-07 MED ORDER — FENTANYL CITRATE (PF) 100 MCG/2ML IJ SOLN
INTRAMUSCULAR | Status: DC | PRN
  Administered 2019-08-07: 25 ug via INTRAVENOUS

## 2019-08-07 MED ORDER — MIDAZOLAM HCL 2 MG/2ML IJ SOLN
INTRAMUSCULAR | Status: DC | PRN
  Administered 2019-08-07: 1 mg via INTRAVENOUS

## 2019-08-07 MED ORDER — FENTANYL CITRATE (PF) 100 MCG/2ML IJ SOLN
INTRAMUSCULAR | Status: AC
  Filled 2019-08-07: qty 2

## 2019-08-07 MED ORDER — ONDANSETRON HCL 4 MG/2ML IJ SOLN
INTRAMUSCULAR | Status: AC
  Filled 2019-08-07: qty 2

## 2019-08-07 MED ORDER — VERAPAMIL HCL 2.5 MG/ML IV SOLN
INTRAVENOUS | Status: AC
  Filled 2019-08-07: qty 2

## 2019-08-07 MED ORDER — LIDOCAINE HCL (PF) 1 % IJ SOLN
INTRAMUSCULAR | Status: DC | PRN
  Administered 2019-08-07 (×2): 2 mL

## 2019-08-07 MED ORDER — SODIUM CHLORIDE 0.9 % WEIGHT BASED INFUSION
3.0000 mL/kg/h | INTRAVENOUS | Status: AC
  Administered 2019-08-07: 3 mL/kg/h via INTRAVENOUS

## 2019-08-07 MED ORDER — ONDANSETRON HCL 4 MG/2ML IJ SOLN: 4.0000 mg | Freq: Four times a day (QID) | INTRAMUSCULAR | Status: DC | PRN

## 2019-08-07 MED ORDER — HEPARIN (PORCINE) IN NACL 1000-0.9 UT/500ML-% IV SOLN
INTRAVENOUS | Status: DC | PRN
  Administered 2019-08-07 (×2): 500 mL

## 2019-08-07 MED ORDER — SODIUM CHLORIDE 0.9 % WEIGHT BASED INFUSION: 1.0000 mL/kg/h | INTRAVENOUS | Status: DC

## 2019-08-07 SURGICAL SUPPLY — 12 items
CATH INFINITI 5FR MPB2 (CATHETERS) ×2 IMPLANT
CATH INFINITI 5FR MULTPACK ANG (CATHETERS) ×2 IMPLANT
CATH SWAN DBL LUMAN 5F 110 (CATHETERS) ×2 IMPLANT
DEVICE RAD COMP TR BAND LRG (VASCULAR PRODUCTS) ×2 IMPLANT
GLIDESHEATH SLEND A-KIT 6F 22G (SHEATH) ×2 IMPLANT
GUIDEWIRE INQWIRE 1.5J.035X260 (WIRE) ×1 IMPLANT
INQWIRE 1.5J .035X260CM (WIRE) ×2
KIT HEART LEFT (KITS) ×2 IMPLANT
PACK CARDIAC CATHETERIZATION (CUSTOM PROCEDURE TRAY) ×2 IMPLANT
SHEATH GLIDE SLENDER 4/5FR (SHEATH) ×2 IMPLANT
TRANSDUCER W/STOPCOCK (MISCELLANEOUS) ×2 IMPLANT
TUBING CIL FLEX 10 FLL-RA (TUBING) ×2 IMPLANT

## 2019-08-07 NOTE — Interval H&P Note (Signed)
History and Physical Interval Note:  08/07/2019 7:41 AM  Sharon Spears  has presented today for surgery, with the diagnosis of CAD.  The various methods of treatment have been discussed with the patient and family. After consideration of risks, benefits and other options for treatment, the patient has consented to  Procedure(s): RIGHT/LEFT HEART CATH AND CORONARY/GRAFT ANGIOGRAPHY (N/A) and possible angioplasty as a surgical intervention.  The patient's history has been reviewed, patient examined, no change in status, stable for surgery.  I have reviewed the patient's chart and labs.  Questions were answered to the patient's satisfaction.    Cath Lab Visit (complete for each Cath Lab visit)  Clinical Evaluation Leading to the Procedure:   ACS: Yes.    Non-ACS:    Anginal Classification: CCS III  Anti-ischemic medical therapy: Maximal Therapy (2 or more classes of medications)  Non-Invasive Test Results: No non-invasive testing performed  Prior CABG: Previous CABG  Adrian Prows

## 2019-08-07 NOTE — Discharge Instructions (Signed)
Radial Site Care  This sheet gives you information about how to care for yourself after your procedure. Your health care provider may also give you more specific instructions. If you have problems or questions, contact your health care provider. What can I expect after the procedure? After the procedure, it is common to have:  Bruising and tenderness at the catheter insertion area. Follow these instructions at home: Medicines  Take over-the-counter and prescription medicines only as told by your health care provider. Insertion site care  Follow instructions from your health care provider about how to take care of your insertion site. Make sure you: ? Wash your hands with soap and water before you change your bandage (dressing). If soap and water are not available, use hand sanitizer. ? Change your dressing as told by your health care provider. ? Leave stitches (sutures), skin glue, or adhesive strips in place. These skin closures may need to stay in place for 2 weeks or longer. If adhesive strip edges start to loosen and curl up, you may trim the loose edges. Do not remove adhesive strips completely unless your health care provider tells you to do that.  Check your insertion site every day for signs of infection. Check for: ? Redness, swelling, or pain. ? Fluid or blood. ? Pus or a bad smell. ? Warmth.  Do not take baths, swim, or use a hot tub until your health care provider approves.  You may shower 24-48 hours after the procedure, or as directed by your health care provider. ? Remove the dressing and gently wash the site with plain soap and water. ? Pat the area dry with a clean towel. ? Do not rub the site. That could cause bleeding.  Do not apply powder or lotion to the site. Activity   For 24 hours after the procedure, or as directed by your health care provider: ? Do not flex or bend the affected arm. ? Do not push or pull heavy objects with the affected arm. ? Do not  drive yourself home from the hospital or clinic. You may drive 24 hours after the procedure unless your health care provider tells you not to. ? Do not operate machinery or power tools.  Do not lift anything that is heavier than 10 lb (4.5 kg), or the limit that you are told, until your health care provider says that it is safe.  Ask your health care provider when it is okay to: ? Return to work or school. ? Resume usual physical activities or sports. ? Resume sexual activity. General instructions  If the catheter site starts to bleed, raise your arm and put firm pressure on the site. If the bleeding does not stop, get help right away. This is a medical emergency.  If you went home on the same day as your procedure, a responsible adult should be with you for the first 24 hours after you arrive home.  Keep all follow-up visits as told by your health care provider. This is important. Contact a health care provider if:  You have a fever.  You have redness, swelling, or yellow drainage around your insertion site. Get help right away if:  You have unusual pain at the radial site.  The catheter insertion area swells very fast.  The insertion area is bleeding, and the bleeding does not stop when you hold steady pressure on the area.  Your arm or hand becomes pale, cool, tingly, or numb. These symptoms may represent a serious problem   that is an emergency. Do not wait to see if the symptoms will go away. Get medical help right away. Call your local emergency services (911 in the [place] Do not drive yourself to the hospital. Summary  After the procedure, it is common to have bruising and tenderness at the site.  Follow instructions from your health care provider about how to take care of your radial site wound. Check the wound every day for signs of infection.  Do not lift anything that is heavier than 10 lb (4.5 kg), or the limit that you are told, until your health care provider says  that it is safe. This information is not intended to replace advice given to you by your health care provider. Make sure you discuss any questions you have with your health care provider. Document Revised: 02/02/2017 Document Reviewed: 02/02/2017 Elsevier Patient Education  2020 [employer].

## 2019-08-08 ENCOUNTER — Telehealth: Payer: Self-pay

## 2019-08-08 ENCOUNTER — Encounter (HOSPITAL_COMMUNITY): Payer: Self-pay | Admitting: Cardiology

## 2019-08-08 NOTE — Telephone Encounter (Signed)
Patient says you were supposed to call her something in for Pulmonary hypertension yesterday while she was at the hospital.She doesn't remember what the medication is.

## 2019-08-08 NOTE — Telephone Encounter (Signed)
Social worker faxed Friday for provider to fill out for home care. Pt was released yesterday from hosp  After surgery on left arm.

## 2019-08-09 ENCOUNTER — Telehealth: Payer: Self-pay

## 2019-08-09 DIAGNOSIS — I272 Pulmonary hypertension, unspecified: Secondary | ICD-10-CM

## 2019-08-09 MED ORDER — SILDENAFIL CITRATE 20 MG PO TABS: 20.0000 mg | ORAL_TABLET | Freq: Three times a day (TID) | ORAL | 6 refills | Status: DC

## 2019-08-09 MED ORDER — OPSUMIT 10 MG PO TABS: 10.0000 mg | ORAL_TABLET | Freq: Every day | ORAL | 6 refills | Status: DC

## 2019-08-09 NOTE — Telephone Encounter (Signed)
Spoke to [employer] and verified the medication patient is taking.

## 2019-08-09 NOTE — Telephone Encounter (Signed)
Opsumet 10 mg daily and Revatio 20 mg TID. Indication primary pulmonary hypertension per Dr. Einar Gip. Medication sent to [facility].

## 2019-08-09 NOTE — Telephone Encounter (Signed)
Opsumit and Revatio preauthorization  Waiting for Dr. Einar Gip to confirm dosages of medication.

## 2019-08-09 NOTE — Telephone Encounter (Signed)
[employer] called to inform us that preauthorization paperwork is being faxed over to be completed.

## 2019-08-13 ENCOUNTER — Other Ambulatory Visit: Payer: Self-pay

## 2019-08-13 DIAGNOSIS — I272 Pulmonary hypertension, unspecified: Secondary | ICD-10-CM

## 2019-08-13 MED ORDER — OPSUMIT 10 MG PO TABS: 10.0000 mg | ORAL_TABLET | Freq: Every day | ORAL | 6 refills | Status: DC

## 2019-08-13 NOTE — Telephone Encounter (Signed)
Priorauthorization completed through covermymeds. Pending.

## 2019-08-14 ENCOUNTER — Telehealth: Payer: Self-pay

## 2019-08-14 NOTE — Telephone Encounter (Signed)
Called patient and patient states that her social worker faxed over a form for patient to have home health services. Patient states she does not feel well and needs someone to help her. Please f/u with patient regarding the PCS form.   Patient social worker is Medical laboratory scientific officer: [phone]     Copied from [place] [id number]. Topic: General - Inquiry >> Aug 07, 2019 12:25 PM Alease Frame wrote: Reason for CRM: Pt is requesting a call back from office regarding forms that needs to be faxed to her social worker . Please reach out to pt >> Aug 08, 2019  9:53 AM Alanda Slim E wrote: Pt states she has been calling since Friday to get forms faxed to her social worker for home care since being discharged from the hospital/ Pt called again today to see if this has been done and to see why no one has returned any of her calls or messages/Pt is upset/  please advise asap

## 2019-08-14 NOTE — Telephone Encounter (Signed)
The insurance called in regard to pt Opsumit medication. It got approved until December.

## 2019-08-15 NOTE — Telephone Encounter (Signed)
Called and spoke with patient daughter, she was going to contact pharmacy to see cost now.

## 2019-08-15 NOTE — Telephone Encounter (Signed)
Caller name: Antionette  Relation to pt: Sharon Spears  Call back number:  [phone]  Reason for call:  Please fax form to [facility] both fax # [phone] / [phone] patient would like a return call at [phone] when completed

## 2019-08-16 NOTE — Telephone Encounter (Signed)
Spoke to [employer] the Education officer, museum.  She stated patient need PCS service for her all her medical conditions that will be making it hard for her to do ADLS.  She stated when she saw her it is more of her breathing and she look super weak.   Social Worker Antoinette would like to speak to PCP regarding patient need PCS service for her medical conditions.

## 2019-08-16 NOTE — Telephone Encounter (Signed)
At this time a cardiac cath would not warrant PCS services. If she feels they are still needed she can contact cardiology as I do not see any procedure information in the system that would warrant her not being able to use both arms for a week.

## 2019-08-17 ENCOUNTER — Encounter (HOSPITAL_COMMUNITY): Payer: Self-pay | Admitting: Emergency Medicine

## 2019-08-17 ENCOUNTER — Emergency Department (HOSPITAL_COMMUNITY)
Admission: EM | Admit: 2019-08-17 | Discharge: 2019-08-17 | Disposition: A | Discharge status: Home / Self Care | Payer: Medicare Other | Attending: Emergency Medicine | Admitting: Emergency Medicine

## 2019-08-17 ENCOUNTER — Emergency Department (HOSPITAL_COMMUNITY): Payer: Medicare Other

## 2019-08-17 ENCOUNTER — Telehealth: Payer: Self-pay | Admitting: Surgery

## 2019-08-17 ENCOUNTER — Other Ambulatory Visit: Payer: Self-pay

## 2019-08-17 DIAGNOSIS — Z7901 Long term (current) use of anticoagulants: Secondary | ICD-10-CM | POA: Insufficient documentation

## 2019-08-17 DIAGNOSIS — Z951 Presence of aortocoronary bypass graft: Secondary | ICD-10-CM | POA: Insufficient documentation

## 2019-08-17 DIAGNOSIS — I495 Sick sinus syndrome: Secondary | ICD-10-CM

## 2019-08-17 DIAGNOSIS — I110 Hypertensive heart disease with heart failure: Secondary | ICD-10-CM | POA: Diagnosis not present

## 2019-08-17 DIAGNOSIS — I272 Pulmonary hypertension, unspecified: Secondary | ICD-10-CM

## 2019-08-17 DIAGNOSIS — Z79899 Other long term (current) drug therapy: Secondary | ICD-10-CM | POA: Insufficient documentation

## 2019-08-17 DIAGNOSIS — R001 Bradycardia, unspecified: Secondary | ICD-10-CM | POA: Insufficient documentation

## 2019-08-17 DIAGNOSIS — R0602 Shortness of breath: Secondary | ICD-10-CM | POA: Insufficient documentation

## 2019-08-17 DIAGNOSIS — I25118 Atherosclerotic heart disease of native coronary artery with other forms of angina pectoris: Secondary | ICD-10-CM

## 2019-08-17 DIAGNOSIS — R1013 Epigastric pain: Secondary | ICD-10-CM

## 2019-08-17 DIAGNOSIS — E119 Type 2 diabetes mellitus without complications: Secondary | ICD-10-CM | POA: Diagnosis not present

## 2019-08-17 DIAGNOSIS — J45909 Unspecified asthma, uncomplicated: Secondary | ICD-10-CM | POA: Insufficient documentation

## 2019-08-17 DIAGNOSIS — J441 Chronic obstructive pulmonary disease with (acute) exacerbation: Secondary | ICD-10-CM | POA: Insufficient documentation

## 2019-08-17 DIAGNOSIS — I5022 Chronic systolic (congestive) heart failure: Secondary | ICD-10-CM | POA: Diagnosis not present

## 2019-08-17 DIAGNOSIS — I10 Essential (primary) hypertension: Secondary | ICD-10-CM

## 2019-08-17 LAB — HEPATIC FUNCTION PANEL
ALT: 33 U/L (ref 0–44)
AST: 34 U/L (ref 15–41)
Albumin: 3.5 g/dL (ref 3.5–5.0)
Alkaline Phosphatase: 78 U/L (ref 38–126)
Bilirubin, Direct: <0.1 mg/dL (ref 0.0–0.2)
Total Bilirubin: 0.7 mg/dL (ref 0.3–1.2)
Total Protein: 7 g/dL (ref 6.5–8.1)

## 2019-08-17 LAB — BASIC METABOLIC PANEL
Anion gap: 12 (ref 5–15)
BUN: 21 mg/dL (ref 8–23)
CO2: 22 mmol/L (ref 22–32)
Calcium: 9.2 mg/dL (ref 8.9–10.3)
Chloride: 105 mmol/L (ref 98–111)
Creatinine, Ser: 0.89 mg/dL (ref 0.44–1.00)
GFR calc Af Amer: >60 mL/min (ref >60)
GFR calc non Af Amer: >60 mL/min (ref >60)
Glucose, Bld: 232 mg/dL — ABNORMAL HIGH (ref 70–99)
Potassium: 4.1 mmol/L (ref 3.5–5.1)
Sodium: 139 mmol/L (ref 135–145)

## 2019-08-17 LAB — CBC
HCT: 40.2 % (ref 36.0–46.0)
Hemoglobin: 12.4 g/dL (ref 12.0–15.0)
MCH: 25.8 pg — ABNORMAL LOW (ref 26.0–34.0)
MCHC: 30.8 g/dL (ref 30.0–36.0)
MCV: 83.6 fL (ref 80.0–100.0)
Platelets: 171 10*3/uL (ref 150–400)
RBC: 4.81 MIL/uL (ref 3.87–5.11)
RDW: 15.9 % — ABNORMAL HIGH (ref 11.5–15.5)
WBC: 5.5 10*3/uL (ref 4.0–10.5)
nRBC: 0 % (ref 0.0–0.2)

## 2019-08-17 LAB — TROPONIN I (HIGH SENSITIVITY)
Troponin I (High Sensitivity): 9 ng/L (ref <18)
Troponin I (High Sensitivity): 10 ng/L (ref <18)

## 2019-08-17 LAB — BRAIN NATRIURETIC PEPTIDE: B Natriuretic Peptide: 405.4 pg/mL — ABNORMAL HIGH (ref 0.0–100.0)

## 2019-08-17 LAB — LIPASE, BLOOD: Lipase: 53 U/L — ABNORMAL HIGH (ref 11–51)

## 2019-08-17 MED ORDER — OPSUMIT 10 MG PO TABS: 10.0000 mg | ORAL_TABLET | Freq: Every day | ORAL | 6 refills | Status: DC

## 2019-08-17 MED ORDER — SILDENAFIL CITRATE 20 MG PO TABS
20.0000 mg | ORAL_TABLET | Freq: Three times a day (TID) | ORAL | 6 refills | Status: DC
Start: 2019-08-17 — End: 2019-08-23

## 2019-08-17 MED ORDER — FAMOTIDINE 20 MG PO TABS
20.0000 mg | ORAL_TABLET | Freq: Once | ORAL | Status: AC
  Administered 2019-08-17: 20 mg via ORAL
  Filled 2019-08-17: qty 1

## 2019-08-17 MED ORDER — SILDENAFIL CITRATE 20 MG PO TABS: 20.0000 mg | ORAL_TABLET | Freq: Three times a day (TID) | ORAL | 6 refills | Status: DC

## 2019-08-17 MED ORDER — ALUM & MAG HYDROXIDE-SIMETH 200-200-20 MG/5ML PO SUSP
30.0000 mL | Freq: Once | ORAL | Status: AC
  Administered 2019-08-17: 30 mL via ORAL
  Filled 2019-08-17: qty 30

## 2019-08-17 MED ORDER — SODIUM CHLORIDE 0.9% FLUSH
3.0000 mL | Freq: Once | INTRAVENOUS | Status: AC
  Administered 2019-08-17: 3 mL via INTRAVENOUS

## 2019-08-17 MED ORDER — BISOPROLOL FUMARATE 10 MG PO TABS: 5.0000 mg | ORAL_TABLET | Freq: Every day | ORAL | 3 refills | Status: DC

## 2019-08-17 MED ORDER — LIDOCAINE VISCOUS HCL 2 % MT SOLN
15.0000 mL | Freq: Once | OROMUCOSAL | Status: AC
  Administered 2019-08-17: 15 mL via ORAL
  Filled 2019-08-17: qty 15

## 2019-08-17 NOTE — ED Notes (Signed)
Pt complaining of cough, requesting medication, EDP notified

## 2019-08-17 NOTE — ED Triage Notes (Signed)
Pt reports epigastric pain and difficulty breathing for the past few days, reports hx of pulmonary htn but is not on any medication. Pt a/ox4, resp e/u, nad.

## 2019-08-17 NOTE — ED Notes (Signed)
Patient verbalizes understanding of discharge instructions. Opportunity for questioning and answers were provided. Armband removed by staff, pt discharged from ED to home

## 2019-08-17 NOTE — Discharge Instructions (Addendum)
Please do not take your bisoprolol for the next 2 days then take 1/2 tablet of bisoprolol until you follow-up with your cardiologist.  Please call your cardiologist if you continue to have any chest pain or shortness of breath and please follow-up with your next scheduled appointment.

## 2019-08-17 NOTE — ED Provider Notes (Signed)
[name] Provider Note   CSN: [id number] Arrival date & time: 08/17/19  9242     History Chief Complaint  Patient presents with  . Abdominal Pain  . Shortness of Breath  . Bradycardia    Sharon Spears is a 74 y.o. female.  The history is provided by the patient.  Abdominal Pain Pain location:  Epigastric Pain quality: sharp   Pain radiates to:  Does not radiate Pain severity:  Moderate Onset quality:  Sudden Duration:  12 hours Timing:  Constant Progression:  Worsening Chronicity:  New Relieved by:  Nothing Worsened by:  Nothing Ineffective treatments:  None tried Associated symptoms: nausea and shortness of breath   Associated symptoms: no chest pain, no chills, no cough, no dysuria, no fever, no hematuria, no sore throat and no vomiting   Shortness of Breath Associated symptoms: abdominal pain   Associated symptoms: no chest pain, no cough, no ear pain, no fever, no rash, no sore throat and no vomiting    74 year old female with history of pulmonary hypertension, A. fib presents with epigastric pain.  Patient states that she had a cath done with her cardiologist approximately 10 days ago to evaluate her pulmonary hypertension and since then she has been having intermittent epigastric pain as well as shortness of breath.  Patient states that she has shortness of breath at baseline however has worsened since the cath.  Patient states that when she went to bed last night she had severe epigastric pain that has continued into this morning.  Endorse nausea associated with the episode but denies fever, chills, cough, worsening chest pain, abdominal pain.  Normal bowel movements per patient.    Past Medical History:  Diagnosis Date  . Adjustment disorder with anxiety   . Asthma   . Atrial fibrillation ([place])   . Benign essential hypertension   . COPD (chronic obstructive pulmonary disease) ([place])   . Coronary artery disease   .  Diabetic neuropathic arthritis ([place])   . Heart palpitations   . Hypoxia 07/19/2018  . Other cirrhosis of liver ([place]) 07/20/2018  . Uncontrolled diabetes mellitus type 2 without complications     Patient Active Problem List   Diagnosis Date Noted  . Severe persistent asthma 02/14/2019  . Medication monitoring encounter 02/14/2019  . Episode of recurrent major depressive disorder ([place]) 01/24/2019  . Coronary artery disease of native artery of native heart with stable angina pectoris ([place]) 01/24/2019  . Thyroid mass 12/01/2018  . Renal mass 12/01/2018  . Complex care coordination 12/01/2018  . Other cirrhosis of liver ([place]) 07/20/2018  . Unspecified atrial fibrillation ([place]) 07/20/2018  . Cough 02/08/2018  . Dyspnea on exertion 02/08/2018  . Chronic systolic heart failure ([place]) 09/25/2017  . Thrombocytopenia ([place]) 03/20/2015  . Type 2 diabetes mellitus with other specified complication ([place]) 68/34/1962  . HTN (hypertension) 03/20/2015  . S/P CABG (coronary artery bypass graft) 03/20/2015    Past Surgical History:  Procedure Laterality Date  . CARDIAC SURGERY    . CESAREAN SECTION    . CORONARY ARTERY BYPASS GRAFT  07/06/2017   Redo-sternotomy and CABG 2 with LIMA to LAD, SVG to OM, and a a closure, mitral valve repair with #30 mm [place] ring on 07/06/2017  . ESOPHAGEAL MANOMETRY N/A 03/06/2012   Procedure: ESOPHAGEAL MANOMETRY (EM);  Surgeon: Beryle Beams, MD;  Location: [facility];  Service: Endoscopy;  Laterality: N/A;  . IR ANGIOGRAM SELECTIVE EACH ADDITIONAL VESSEL  02/16/2019  . IR  ANGIOGRAM SELECTIVE EACH ADDITIONAL VESSEL  02/16/2019  . IR ANGIOGRAM SELECTIVE EACH ADDITIONAL VESSEL  02/16/2019  . IR ANGIOGRAM SELECTIVE EACH ADDITIONAL VESSEL  02/16/2019  . IR RENAL SELECTIVE  UNI INC S&I MOD SED  02/16/2019  . IR US GUIDE VASC ACCESS RIGHT  02/16/2019  . RIGHT/LEFT HEART CATH AND CORONARY/GRAFT ANGIOGRAPHY N/A 08/07/2019   Procedure: RIGHT/LEFT HEART CATH AND CORONARY/GRAFT  ANGIOGRAPHY;  Surgeon: Adrian Prows, MD;  Location: [facility];  Service: Cardiovascular;  Laterality: N/A;  . TONSILLECTOMY       OB History   No obstetric history on file.     Family History  Problem Relation Age of Onset  . Asthma Mother   . Alcohol abuse Father     Social History   Tobacco Use  . Smoking status: Never Smoker  . Smokeless tobacco: Never Used  Vaping Use  . Vaping Use: Never used  Substance Use Topics  . Alcohol use: No  . Drug use: Never    Home Medications Prior to Admission medications   Medication Sig Start Date End Date Taking? Authorizing Provider  albuterol (VENTOLIN HFA) 108 (90 Base) MCG/ACT inhaler INHALE 2 PUFFS INTO THE LUNGS EVERY 6 HOURS AS NEEDED FOR WHEEZE Patient taking differently: Inhale 2 puffs into the lungs every 6 (six) hours as needed for wheezing or shortness of breath.  04/09/19  Yes Lauraine Rinne, NP  apixaban (ELIQUIS) 5 MG TABS tablet Take 1 tablet (5 mg total) by mouth 2 (two) times daily. Patient taking differently: Take 5 mg by mouth daily after lunch.  09/28/17  Yes Charolette Forward, MD  arformoterol (BROVANA) 15 MCG/2ML NEBU Take 2 mLs (15 mcg total) by nebulization 2 (two) times daily. 07/02/19  Yes Elsie Stain, MD  atorvastatin (LIPITOR) 40 MG tablet Take 1 tablet (40 mg total) by mouth daily. 07/03/19  Yes Adrian Prows, MD  budesonide (PULMICORT) 0.25 MG/2ML nebulizer solution Take 2 mLs (0.25 mg total) by nebulization 2 (two) times daily. 07/02/19  Yes Elsie Stain, MD  diphenhydrAMINE (BENADRYL) 12.5 MG/5ML elixir Take 12.5 mg by mouth daily as needed for itching or allergies.  12/05/18  Yes [provider]  famotidine (PEPCID) 20 MG tablet Take 20 mg by mouth 2 (two) times daily.   Yes [provider]  ferrous sulfate 325 (65 FE) MG tablet Take 1 tablet (325 mg total) by mouth 3 (three) times daily with meals. 07/24/19  Yes Gildardo Pounds, NP  furosemide (LASIX) 20 MG tablet Take 1 tablet (20  mg total) by mouth 2 (two) times daily. Patient taking differently: Take 10 mg by mouth daily.  07/21/18  Yes Samuella Cota, MD  isosorbide mononitrate (IMDUR) 30 MG 24 hr tablet Take 30 mg by mouth daily as needed (chest pain).  07/12/18  Yes [provider]  metFORMIN (GLUCOPHAGE) 500 MG tablet Take 500 mg by mouth daily.   Yes [provider]  montelukast (SINGULAIR) 10 MG tablet Take 10 mg by mouth at bedtime.  07/04/19  Yes [provider]  nitroGLYCERIN (NITROSTAT) 0.4 MG SL tablet Place 0.4 mg under the tongue every 5 (five) minutes as needed for chest pain.    Yes [provider]  bisoprolol (ZEBETA) 10 MG tablet Take 0.5 tablets (5 mg total) by mouth daily. 08/17/19   Silvestre Gunner, MD  Blood Glucose Monitoring Suppl (ACCU-CHEK AVIVA PLUS) w/Device KIT USE TO CHECK BLOOD SUGARS ONCE DAILY 10/10/18   [provider]  glucose blood (ACCU-CHEK AVIVA PLUS) test strip USE WITH GLUCOMETER TO CHECK BLOOD SUGARS ONCE DAILY 10/10/18   [provider]  losartan (COZAAR) 100 MG tablet Take 1 tablet (100 mg total) by mouth daily. Patient not taking: Reported on 08/17/2019 06/05/19 09/03/19  Gildardo Pounds, NP  macitentan (OPSUMIT) 10 MG tablet Take 1 tablet (10 mg total) by mouth daily. 08/17/19   Adrian Prows, MD  metFORMIN (GLUCOPHAGE XR) 500 MG 24 hr tablet Take 1 tablet (500 mg total) by mouth daily with breakfast. 08/09/19 11/07/19  Adrian Prows, MD  sildenafil (REVATIO) 20 MG tablet Take 1 tablet (20 mg total) by mouth 3 (three) times daily. 08/17/19   Adrian Prows, MD    Allergies    Contrast media [iodinated diagnostic agents], Adhesive [tape], and Penicillins  Review of Systems   Review of Systems  Constitutional: Negative for chills and fever.  HENT: Negative for ear pain and sore throat.   Eyes: Negative for pain and visual disturbance.  Respiratory: Positive for shortness of breath. Negative for cough.   Cardiovascular: Negative for chest  pain and palpitations.  Gastrointestinal: Positive for abdominal pain and nausea. Negative for vomiting.  Genitourinary: Negative for dysuria and hematuria.  Musculoskeletal: Negative for arthralgias and back pain.  Skin: Negative for color change and rash.  Neurological: Negative for seizures and syncope.  All other systems reviewed and are negative.   Physical Exam Updated Vital Signs BP (!) 145/99   Pulse (!) 53   Temp 98.9 F (37.2 C) (Oral)   Resp 20   Ht _0  (1.6 m)   Wt 68 kg   SpO2 95%   BMI 26.57 kg/m   Physical Exam Vitals and nursing note reviewed.  Constitutional:      General: She is not in acute distress.    Appearance: She is well-developed.  HENT:     Head: Normocephalic and atraumatic.  Eyes:     Conjunctiva/sclera: Conjunctivae normal.  Cardiovascular:     Rate and Rhythm: Normal rate and regular rhythm.     Heart sounds: No murmur heard.   Pulmonary:     Effort: Pulmonary effort is normal. No respiratory distress.     Breath sounds: Normal breath sounds. No wheezing, rhonchi or rales.  Abdominal:     Palpations: Abdomen is soft.     Tenderness: There is abdominal tenderness in the epigastric area. There is no guarding or rebound.  Musculoskeletal:     Cervical back: Neck supple.  Skin:    General: Skin is warm and dry.  Neurological:     General: No focal deficit present.     Mental Status: She is alert and oriented to person, place, and time.     Cranial Nerves: No cranial nerve deficit.     Motor: No weakness.     ED Results / Procedures / Treatments   Labs (all labs ordered are listed, but only abnormal results are displayed) Labs Reviewed  BASIC METABOLIC PANEL - Abnormal; Notable for the following components:      Result Value   Glucose, Bld 232 (*)    All other components within normal limits  CBC - Abnormal; Notable for the following components:   MCH 25.8 (*)    RDW 15.9 (*)    All other components within normal limits    BRAIN NATRIURETIC PEPTIDE - Abnormal; Notable for the following components:   B Natriuretic Peptide 405.4 (*)    All other components within normal limits  LIPASE, BLOOD - Abnormal; Notable for the following components:   Lipase 53 (*)    All other components within normal limits  HEPATIC FUNCTION PANEL  TROPONIN I (HIGH SENSITIVITY)  TROPONIN I (HIGH SENSITIVITY)    EKG EKG Interpretation  Date/Time:  Friday August 17 2019 08:45:32 EDT Ventricular Rate:  44 PR Interval:    QRS Duration: 92 QT Interval:  520 QTC Calculation: 444 R Axis:   111 Text Interpretation: Atrial fibrillation with slow ventricular response with a competing junctional pacemaker Right axis deviation Nonspecific ST and T wave abnormality Abnormal ECG Confirmed by Sherwood Gambler [phone]) on 08/17/2019 8:56:35 AM Also confirmed by Sherwood Gambler [phone]), editor Hattie Perch ([id number])  on 08/17/2019 12:10:06 PM   Radiology DG Chest Portable 1 View  Result Date: 08/17/2019 CLINICAL DATA:  Shortness of breath. EXAM: PORTABLE CHEST 1 VIEW COMPARISON:  05/10/2019. FINDINGS: Prior CABG. Cardiomegaly with pulmonary venous congestion. Bilateral increase interstitial prominence noted consistent interstitial edema. Small left pleural effusion. No pneumothorax. IMPRESSION: Prior CABG. Cardiomegaly with pulmonary venous congestion, bilateral interstitial prominence, and small left pleural effusion. Findings suggest CHF. Pneumonitis cannot be excluded. Electronically Signed   By: Marcello Moores  Register   On: 08/17/2019 09:34   US Abdomen Limited RUQ  Result Date: 08/17/2019 CLINICAL DATA:  74 year old female with 5 months of epigastric pain. EXAM: ULTRASOUND ABDOMEN LIMITED RIGHT UPPER QUADRANT COMPARISON:  CT Abdomen and Pelvis 01/04/2019. Right upper quadrant ultrasound 07/19/2018 FINDINGS: Gallbladder: No gallstones or wall thickening visualized. No sonographic Murphy sign noted by sonographer. Common bile duct: Diameter: 5 mm,  normal. Liver: Nodular, cirrhotic liver appearance (image 2). Background liver echogenicity within normal limits. No discrete liver lesion. No intrahepatic biliary ductal dilatation. Portal vein is patent on color Doppler imaging with normal direction of blood flow towards the liver. Other: Negative visible right kidney. IMPRESSION: 1. Cirrhotic liver.  No discrete liver lesion by ultrasound. 2. Normal gallbladder.  No evidence of bile duct obstruction. Electronically Signed   By: Genevie Ann M.D.   On: 08/17/2019 14:45    Procedures Procedures (including critical care time)  Medications Ordered in ED Medications  sodium chloride flush (NS) 0.9 % injection 3 mL (3 mLs Intravenous Given 08/17/19 0938)  famotidine (PEPCID) tablet 20 mg (20 mg Oral Given 08/17/19 1456)  alum & mag hydroxide-simeth (MAALOX/MYLANTA) 200-200-20 MG/5ML suspension 30 mL (30 mLs Oral Given 08/17/19 1457)    And  lidocaine (XYLOCAINE) 2 % viscous mouth solution 15 mL (15 mLs Oral Given 08/17/19 1457)    ED Course  I have reviewed the triage vital signs and the nursing notes.  Pertinent labs & imaging results that were available during my care of the patient were reviewed by me and considered in my medical decision making (see chart for details).    MDM Rules/Calculators/A&P                          74 year old female with history of pulmonary hypertension presents with epigastric pain.  Afebrile.  Heart rate initially high 30s low 40s.  Remainder vital signs stable.  Exam as above.  Exam most notable for bradycardia as well as epigastric tenderness to palpation.  EKG showed A. fib with slow ventricular response with no active signs of ischemia.  BMP, CBC, lipase, hepatic function panel unremarkable.  Troponin 9-->10.  Lipase 53.  BNP 405.  Upon reevaluation patient continued to complain of epigastric pain.  She was given a GI cocktail with  mild improvement of her symptoms.  Given her persistent pain in the right upper quadrant  ultrasound was obtained which did not show any biliary disease.  On chart review patient has presented to her cardiologist as well as to the emergency part multiple times in the past for chest pain.  Patient's cardiologist appears to believe that there may be an anxiety component to this chest pain and upon patient evaluation today I agree with his assessment.  Low concern for ACS, PE, dissection in this patient.  Spoke with cardiology on-call about patient's bradycardia and they recommended holding patient's bisoprolol for 2 days and then decrease the dose from 10 mg to 5 mg.  This was communicated to the patient.  Patient was also expressing concern about inability to obtain her pulmonary hypertension medication specifically her sildenafil and her OPSUMIT.  Spoke to social work about this and social work spoke to patient as well as patient's cardiology office.  Plan is for patient to call her clinic tomorrow to discuss how to receive these medications.  This was communicated to the patient who agreed with this plan.  Overall I feel the patient is stable for discharge at this time.  I discussed the overall plan with patient who voiced agreement and understanding the overall plan.  Patient was discharged in stable condition without further events. Final Clinical Impression(s) / ED Diagnoses Final diagnoses:  Epigastric abdominal pain    Rx / DC Orders ED Discharge Orders         Ordered    bisoprolol (ZEBETA) 10 MG tablet  Daily     Discontinue  Reprint     08/17/19 1510           Silvestre Gunner, MD 08/17/19 1644    Sherwood Gambler, MD 08/20/19 [phone]

## 2019-08-17 NOTE — Telephone Encounter (Signed)
ED CM received handoff from daytime ED CM about patient needing assistance with obtaining specialty meds from [facility] mail order pharmacy.  ED RNCM messaged Cecilia CMA from Dr. Skip Estimable office, and was made away patient's daughter is follow ing up with pharmacy concerning the medication delivery. CM contacted daughter to confirm and was made aware prior Josem Kaufmann was approved but they did not have prescriptions. CM was give the number to fax  [phone] at [facility] to fax prescriptions in to start the process. Prescriptions were written for Opsumit 36m and Sildenafil 226m by the EDP and faxed in. Daughter will be made aware.

## 2019-08-17 NOTE — ED Notes (Signed)
Assisted pt to ambulate to restroom with walker; able to perform this task without SOB or dizziness

## 2019-08-17 NOTE — Discharge Planning (Signed)
RNCM consulted regarding medication assistance. RNCM met with pt at bedside regarding this issue.  Pt states she has not received Rx and therefore has not taken it.  RNCM alerted Dr Irven Shelling office. RNCM also checking with [facility] pharmacy to see if we can fill prior to pt discharge home today.

## 2019-08-20 NOTE — Telephone Encounter (Signed)
PAPER WORK HAS BEEN COMPLETED

## 2019-08-20 NOTE — Telephone Encounter (Signed)
Sharon Spears returning call. Please advise.     [phone]

## 2019-08-20 NOTE — Telephone Encounter (Signed)
PCS form has been faxed to [facility].

## 2019-08-22 ENCOUNTER — Ambulatory Visit: Payer: Self-pay | Admitting: *Deleted

## 2019-08-22 NOTE — Telephone Encounter (Signed)
Patient would like to speak with nurse regarding forms faxed please return call [phone]. (patient expressed she is very upset no one is returning her call)

## 2019-08-22 NOTE — Telephone Encounter (Signed)
Attempted to call patient- 3rd attempt. Left message to call office with concerns.

## 2019-08-22 NOTE — Telephone Encounter (Signed)
Spoke to patient and informed CMA faxed the PCS form. Pt. Will contact her Education officer, [employer].

## 2019-08-22 NOTE — Telephone Encounter (Signed)
Summary: Clinical Advice   Patient complaining of both legs are swollen, retaining fluid seeking clinical advice. Patient states new medication sildenafil (REVATIO) 20 MG tablet may be the cause of swollen legs, cough and low grade fever. Please advise      Attempted to call patient- 2nd attempt. Left message on VM to return call to office

## 2019-08-23 ENCOUNTER — Ambulatory Visit: Payer: Medicare Other | Admitting: Cardiology

## 2019-08-23 ENCOUNTER — Other Ambulatory Visit: Payer: Self-pay

## 2019-08-23 ENCOUNTER — Encounter: Payer: Self-pay | Admitting: Cardiology

## 2019-08-23 VITALS — BP 168/64 | HR 65 | Resp 16 | Ht 63.0 in | Wt 147.0 lb

## 2019-08-23 DIAGNOSIS — I2723 Pulmonary hypertension due to lung diseases and hypoxia: Secondary | ICD-10-CM

## 2019-08-23 DIAGNOSIS — K7469 Other cirrhosis of liver: Secondary | ICD-10-CM

## 2019-08-23 DIAGNOSIS — I25118 Atherosclerotic heart disease of native coronary artery with other forms of angina pectoris: Secondary | ICD-10-CM

## 2019-08-23 DIAGNOSIS — I4821 Permanent atrial fibrillation: Secondary | ICD-10-CM

## 2019-08-23 DIAGNOSIS — I495 Sick sinus syndrome: Secondary | ICD-10-CM

## 2019-08-23 DIAGNOSIS — R0609 Other forms of dyspnea: Secondary | ICD-10-CM

## 2019-08-23 DIAGNOSIS — I272 Pulmonary hypertension, unspecified: Secondary | ICD-10-CM

## 2019-08-23 MED ORDER — OPSUMIT 10 MG PO TABS: 10.0000 mg | ORAL_TABLET | Freq: Every day | ORAL | 6 refills | Status: AC

## 2019-08-23 MED ORDER — APIXABAN 5 MG PO TABS: 5.0000 mg | ORAL_TABLET | Freq: Two times a day (BID) | ORAL | 3 refills | Status: DC

## 2019-08-23 MED ORDER — SILDENAFIL CITRATE 20 MG PO TABS: 20.0000 mg | ORAL_TABLET | Freq: Three times a day (TID) | ORAL | 6 refills | Status: DC
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
## 2019-08-23 NOTE — Telephone Encounter (Signed)
Pt. Saw her cardiologist at her appt. With them today who prescribed her that medication.

## 2019-08-28 ENCOUNTER — Other Ambulatory Visit: Payer: Self-pay

## 2019-08-28 ENCOUNTER — Ambulatory Visit (HOSPITAL_COMMUNITY)
Admission: EM | Admit: 2019-08-28 | Discharge: 2019-08-28 | Disposition: A | Discharge status: Home / Self Care | Payer: Medicare Other | Attending: Physician Assistant | Admitting: Physician Assistant

## 2019-08-28 ENCOUNTER — Ambulatory Visit (INDEPENDENT_AMBULATORY_CARE_PROVIDER_SITE_OTHER): Payer: Medicare Other

## 2019-08-28 ENCOUNTER — Encounter (HOSPITAL_COMMUNITY): Payer: Self-pay

## 2019-08-28 DIAGNOSIS — M546 Pain in thoracic spine: Secondary | ICD-10-CM

## 2019-08-28 DIAGNOSIS — R05 Cough: Secondary | ICD-10-CM | POA: Diagnosis not present

## 2019-08-28 DIAGNOSIS — R053 Chronic cough: Secondary | ICD-10-CM

## 2019-08-28 MED ORDER — DICLOFENAC SODIUM 1 % EX GEL: 2.0000 g | Freq: Four times a day (QID) | CUTANEOUS | 0 refills | Status: DC

## 2019-08-28 MED ORDER — LIDOCAINE 4 % EX PTCH: 1.0000 | MEDICATED_PATCH | Freq: Two times a day (BID) | CUTANEOUS | 0 refills | Status: DC

## 2019-08-28 MED ORDER — BENZONATATE 100 MG PO CAPS
100.0000 mg | ORAL_CAPSULE | Freq: Three times a day (TID) | ORAL | 0 refills | Status: DC
Start: 2019-08-28 — End: 2019-09-03

## 2019-08-28 MED ORDER — ACETAMINOPHEN 325 MG PO TABS
650.0000 mg | ORAL_TABLET | Freq: Four times a day (QID) | ORAL | 0 refills | Status: DC | PRN
Start: 2019-08-28 — End: 2019-09-11

## 2019-08-28 NOTE — Discharge Instructions (Addendum)
Take tylenol and use the gel as prescribed Apply the lidocaine patch Apply heat  Use the cough medication only as needed for cough  Follow up with your primary care this week  If you develop severely worsening pain, more shortness of breath, chest pain go to the Emergency department

## 2019-08-28 NOTE — ED Triage Notes (Addendum)
Pt c/o 9/10 pain in both lungs with inspiration since yesterday. Pt has non labored breathing.

## 2019-08-28 NOTE — ED Notes (Signed)
Patient in lobby complaining about wait time.  Explained to patient that we have an increased number of patients and will take them in order of arrival.  Patient expressed that her O2 was low.  I checked Pulse Ox and it read 93% on room air.  Notified charge nurse.

## 2019-08-28 NOTE — ED Provider Notes (Signed)
[place]    CSN: [id number] Arrival date & time: 08/28/19  1032      History   Chief Complaint Chief Complaint  Patient presents with  . pain in ribs with inspiratory resp    HPI Sharon Spears is a 74 y.o. female.   Patient reports for pain in her back after episode of coughing last night.  She reports pain on both sides of her back ribs.  She reports pain with deep breaths.  Reports pain with touch.  Denies any increase in her baseline shortness of breath.  She reports she has a cough at baseline which is primarily at night.  She has not been coughing today.  She was concerned as she thought her oxygen was a little low this morning but was not feeling short of breath.  Denies chest pain.  No nausea or vomiting.  No sore throat, runny nose.  No known sick contacts.  No trauma or falls.   Her primary concern today is the pain and likely would not of come in if she was not in pain.  She has tried Tylenol and hot shower and this seemed to help some.     Past Medical History:  Diagnosis Date  . Adjustment disorder with anxiety   . Asthma   . Atrial fibrillation ([place])   . Benign essential hypertension   . COPD (chronic obstructive pulmonary disease) ([place])   . Coronary artery disease   . Diabetic neuropathic arthritis ([place])   . Heart palpitations   . Hypoxia 07/19/2018  . Other cirrhosis of liver ([place]) 07/20/2018  . Uncontrolled diabetes mellitus type 2 without complications     Patient Active Problem List   Diagnosis Date Noted  . Severe persistent asthma 02/14/2019  . Medication monitoring encounter 02/14/2019  . Episode of recurrent major depressive disorder ([place]) 01/24/2019  . Coronary artery disease of native artery of native heart with stable angina pectoris ([place]) 01/24/2019  . Thyroid mass 12/01/2018  . Renal mass 12/01/2018  . Complex care coordination 12/01/2018  . Other cirrhosis of liver ([place]) 07/20/2018  . Unspecified atrial fibrillation ([place])  07/20/2018  . Cough 02/08/2018  . Dyspnea on exertion 02/08/2018  . Chronic systolic heart failure ([place]) 09/25/2017  . Thrombocytopenia ([place]) 03/20/2015  . Type 2 diabetes mellitus with other specified complication ([place]) 95/18/8416  . HTN (hypertension) 03/20/2015  . S/P CABG (coronary artery bypass graft) 03/20/2015    Past Surgical History:  Procedure Laterality Date  . CARDIAC SURGERY    . CESAREAN SECTION    . CORONARY ARTERY BYPASS GRAFT  07/06/2017   Redo-sternotomy and CABG 2 with LIMA to LAD, SVG to OM, and a a closure, mitral valve repair with #30 mm [place] ring on 07/06/2017  . ESOPHAGEAL MANOMETRY N/A 03/06/2012   Procedure: ESOPHAGEAL MANOMETRY (EM);  Surgeon: Beryle Beams, MD;  Location: [facility];  Service: Endoscopy;  Laterality: N/A;  . IR ANGIOGRAM SELECTIVE EACH ADDITIONAL VESSEL  02/16/2019  . IR ANGIOGRAM SELECTIVE EACH ADDITIONAL VESSEL  02/16/2019  . IR ANGIOGRAM SELECTIVE EACH ADDITIONAL VESSEL  02/16/2019  . IR ANGIOGRAM SELECTIVE EACH ADDITIONAL VESSEL  02/16/2019  . IR RENAL SELECTIVE  UNI INC S&I MOD SED  02/16/2019  . IR US GUIDE VASC ACCESS RIGHT  02/16/2019  . RIGHT/LEFT HEART CATH AND CORONARY/GRAFT ANGIOGRAPHY N/A 08/07/2019   Procedure: RIGHT/LEFT HEART CATH AND CORONARY/GRAFT ANGIOGRAPHY;  Surgeon: Adrian Prows, MD;  Location: [facility];  Service: Cardiovascular;  Laterality: N/A;  .  TONSILLECTOMY      OB History   No obstetric history on file.      Home Medications    Prior to Admission medications   Medication Sig Start Date End Date Taking? Authorizing Provider  acetaminophen (TYLENOL) 325 MG tablet Take 2 tablets (650 mg total) by mouth every 6 (six) hours as needed. 08/28/19   Carlyle Achenbach, Marguerita Beards, PA-C  albuterol (VENTOLIN HFA) 108 (90 Base) MCG/ACT inhaler INHALE 2 PUFFS INTO THE LUNGS EVERY 6 HOURS AS NEEDED FOR WHEEZE Patient taking differently: Inhale 2 puffs into the lungs every 6 (six) hours as needed for wheezing or shortness of  breath.  04/09/19   Lauraine Rinne, NP  apixaban (ELIQUIS) 5 MG TABS tablet Take 1 tablet (5 mg total) by mouth 2 (two) times daily. 08/23/19   Adrian Prows, MD  arformoterol (BROVANA) 15 MCG/2ML NEBU Take 2 mLs (15 mcg total) by nebulization 2 (two) times daily. 07/02/19   Elsie Stain, MD  atorvastatin (LIPITOR) 40 MG tablet Take 1 tablet (40 mg total) by mouth daily. 07/03/19   Adrian Prows, MD  benzonatate (TESSALON) 100 MG capsule Take 1 capsule (100 mg total) by mouth every 8 (eight) hours. 08/28/19   Machi Whittaker, Marguerita Beards, PA-C  bisoprolol (ZEBETA) 10 MG tablet Take 0.5 tablets (5 mg total) by mouth daily. 08/17/19   Silvestre Gunner, MD  Blood Glucose Monitoring Suppl (ACCU-CHEK AVIVA PLUS) w/Device KIT USE TO CHECK BLOOD SUGARS ONCE DAILY 10/10/18   [provider]  budesonide (PULMICORT) 0.25 MG/2ML nebulizer solution Take 2 mLs (0.25 mg total) by nebulization 2 (two) times daily. 07/02/19   Elsie Stain, MD  diclofenac Sodium (VOLTAREN) 1 % GEL Apply 2 g topically 4 (four) times daily. 08/28/19   Charlena Haub, Marguerita Beards, PA-C  diphenhydrAMINE (BENADRYL) 12.5 MG/5ML elixir Take 12.5 mg by mouth daily as needed for itching or allergies.  12/05/18   [provider]  famotidine (PEPCID) 20 MG tablet Take 20 mg by mouth 2 (two) times daily.    [provider]  furosemide (LASIX) 20 MG tablet Take 1 tablet (20 mg total) by mouth 2 (two) times daily. Patient taking differently: Take 10 mg by mouth daily.  07/21/18   Samuella Cota, MD  glucose blood (ACCU-CHEK AVIVA PLUS) test strip USE WITH GLUCOMETER TO CHECK BLOOD SUGARS ONCE DAILY 10/10/18   [provider]  Lidocaine (HM LIDOCAINE PATCH) 4 % PTCH Apply 1 patch topically in the morning and at bedtime. 08/28/19   Brittany Amirault, Marguerita Beards, PA-C  losartan (COZAAR) 100 MG tablet Take 1 tablet (100 mg total) by mouth daily. 06/05/19 09/03/19  Gildardo Pounds, NP  macitentan (OPSUMIT) 10 MG tablet Take 1 tablet (10 mg total) by mouth daily.  08/23/19   Adrian Prows, MD  metFORMIN (GLUCOPHAGE) 500 MG tablet Take 500 mg by mouth daily.    [provider]  montelukast (SINGULAIR) 10 MG tablet Take 10 mg by mouth at bedtime.  07/04/19   [provider]  sildenafil (REVATIO) 20 MG tablet Take 1 tablet (20 mg total) by mouth 3 (three) times daily. 08/23/19   Adrian Prows, MD    Family History Family History  Problem Relation Age of Onset  . Asthma Mother   . Alcohol abuse Father     Social History Social History   Tobacco Use  . Smoking status: Never Smoker  . Smokeless tobacco: Never Used  Vaping Use  . Vaping Use: Never used  Substance  Use Topics  . Alcohol use: No  . Drug use: Never     Allergies   Contrast media [iodinated diagnostic agents], Adhesive [tape], and Penicillins   Review of Systems Review of Systems   Physical Exam Triage Vital Signs ED Triage Vitals  Enc Vitals Group     BP 08/28/19 1239 (!) 183/79     Pulse Rate 08/28/19 1239 (!) 57     Resp 08/28/19 1239 16     Temp 08/28/19 1239 98.4 F (36.9 C)     Temp Source 08/28/19 1239 Oral     SpO2 08/28/19 1239 92 %     Weight 08/28/19 1240 151 lb (68.5 kg)     Height 08/28/19 1240 _0  (1.6 m)     Head Circumference --      Peak Flow --      Pain Score 08/28/19 1240 9     Pain Loc --      Pain Edu? --      Excl. in [place]? --    No data found.  Updated Vital Signs BP (!) 183/79   Pulse (!) 57   Temp 98.4 F (36.9 C) (Oral)   Resp 16   Ht _1  (1.6 m)   Wt 151 lb (68.5 kg)   SpO2 92%   BMI 26.75 kg/m   Visual Acuity Right Eye Distance:   Left Eye Distance:   Bilateral Distance:    Right Eye Near:   Left Eye Near:    Bilateral Near:     Physical Exam Vitals and nursing note reviewed.  Constitutional:      General: She is not in acute distress.    Appearance: Normal appearance. She is well-developed. She is not ill-appearing, toxic-appearing or diaphoretic.     Comments: Well-appearing in no acute  distress.   Patient ambulatory out of clinic without any issues.  HENT:     Head: Normocephalic and atraumatic.     Nose: Nose normal.     Mouth/Throat:     Mouth: Mucous membranes are moist.     Pharynx: Oropharynx is clear.  Eyes:     Conjunctiva/sclera: Conjunctivae normal.  Cardiovascular:     Rate and Rhythm: Normal rate and regular rhythm.     Heart sounds: No murmur heard.   Pulmonary:     Effort: Pulmonary effort is normal. No respiratory distress.     Breath sounds: Normal breath sounds. No wheezing, rhonchi or rales.     Comments: Speaking in full sentences.  Lungs clear to auscultation equal bilaterally with good air movement throughout.  There is equal rise and fall of the chest bilaterally.  Saturating at 96% on room air while resting on this provider check. Musculoskeletal:     Cervical back: Normal range of motion and neck supple.     Right lower leg: No edema.     Left lower leg: No edema.  Skin:    General: Skin is warm and dry.  Neurological:     General: No focal deficit present.     Mental Status: She is alert and oriented to person, place, and time.      UC Treatments / Results  Labs (all labs ordered are listed, but only abnormal results are displayed) Labs Reviewed - No data to display  EKG   Radiology DG Chest 2 View  Result Date: 08/28/2019 CLINICAL DATA:  Cough. EXAM: CHEST - 2 VIEW COMPARISON:  08/17/2019. FINDINGS: Prior CABG. Cardiomegaly. Mild bilateral interstitial  prominence. Interstitial edema and/or pneumonitis could present this fashion. Stable left-sided pleural thickening consistent with scarring and or chronic small pleural effusion. No pneumothorax. IMPRESSION: Prior CABG. Cardiomegaly. Diffuse mild bilateral interstitial prominence. Interstitial edema and/or pneumonitis could present this fashion. Stable left-sided pleural thickening consistent scarring and or chronic small pleural effusion. Electronically Signed   By: Marcello Moores   Register   On: 08/28/2019 13:40    Procedures Procedures (including critical care time)  Medications Ordered in UC Medications - No data to display  Initial Impression / Assessment and Plan / UC Course  I have reviewed the triage vital signs and the nursing notes.  Pertinent labs & imaging results that were available during my care of the patient were reviewed by me and considered in my medical decision making (see chart for details).     #Bilateral thoracic back pain #Chronic cough Patient is a 74 year old with history of pulmonary hypertension, CHF and COPD presenting with thoracic rib and back pain likely secondary to cough.  Chest x-ray does show some interstitial prominence, however this is likely close to her baseline given pulmonary hypertension and CHF.  She is saturating well at 96% which appears to be her baseline at rest..  Reviewed recent cardiology note from 08/23/2019, does appear she has some desaturations with ambulation.  Cough seems to be driven primarily from her chronic conditions as opposed to an acute process.  We will treat her pain with Tylenol, topical Voltaren and lidocaine patches.  Discussed strict emergency department precautions.  Instructed her to follow-up with her primary care and other providers for her chronic conditions.  Patient verbalized understanding agreed with the plan of care. Final Clinical Impressions(s) / UC Diagnoses   Final diagnoses:  Acute bilateral thoracic back pain  Chronic cough     Discharge Instructions     Take tylenol and use the gel as prescribed Apply the lidocaine patch Apply heat  Use the cough medication only as needed for cough  Follow up with your primary care this week  If you develop severely worsening pain, more shortness of breath, chest pain go to the Emergency department        ED Prescriptions    Medication Sig Dispense Auth. Provider   acetaminophen (TYLENOL) 325 MG tablet Take 2 tablets (650 mg  total) by mouth every 6 (six) hours as needed. 30 tablet Nicolaos Mitrano, Marguerita Beards, PA-C   diclofenac Sodium (VOLTAREN) 1 % GEL Apply 2 g topically 4 (four) times daily. 100 g Fate Caster, Marguerita Beards, PA-C   Lidocaine (HM LIDOCAINE PATCH) 4 % PTCH Apply 1 patch topically in the morning and at bedtime. 10 patch Shaconda Hajduk, Marguerita Beards, PA-C   benzonatate (TESSALON) 100 MG capsule Take 1 capsule (100 mg total) by mouth every 8 (eight) hours. 21 capsule Jovanny Stephanie, Marguerita Beards, PA-C     PDMP not reviewed this encounter.   Purnell Shoemaker, PA-C 08/28/19 1511

## 2019-08-31 ENCOUNTER — Telehealth: Payer: Self-pay

## 2019-08-31 ENCOUNTER — Other Ambulatory Visit: Payer: Self-pay | Admitting: Interventional Radiology

## 2019-08-31 ENCOUNTER — Telehealth: Payer: Self-pay | Admitting: *Deleted

## 2019-08-31 ENCOUNTER — Other Ambulatory Visit: Payer: Self-pay | Admitting: Nurse Practitioner

## 2019-08-31 DIAGNOSIS — D1771 Benign lipomatous neoplasm of kidney: Secondary | ICD-10-CM

## 2019-08-31 MED ORDER — PREDNISONE 20 MG PO TABS: 40.0000 mg | ORAL_TABLET | Freq: Every day | ORAL | 0 refills | Status: AC

## 2019-08-31 NOTE — Telephone Encounter (Signed)
Pt has declined scheduling any further f/u appts.

## 2019-08-31 NOTE — Telephone Encounter (Signed)
Pt. Was informed PCP sent her prednisone. Patient was informed to follow up w/ her [facility]ologist.

## 2019-08-31 NOTE — Telephone Encounter (Signed)
Patient walked in requesting predisone to be refilled. Please advise

## 2019-09-03 ENCOUNTER — Ambulatory Visit (INDEPENDENT_AMBULATORY_CARE_PROVIDER_SITE_OTHER)
Admission: EM | Admit: 2019-09-03 | Discharge: 2019-09-03 | Disposition: A | Discharge status: Home / Self Care | Payer: Medicare Other | Source: Home / Self Care | Attending: Family Medicine | Admitting: Family Medicine

## 2019-09-03 ENCOUNTER — Emergency Department (HOSPITAL_COMMUNITY): Payer: Medicare Other

## 2019-09-03 ENCOUNTER — Emergency Department (HOSPITAL_COMMUNITY)
Admission: EM | Admit: 2019-09-03 | Discharge: 2019-09-03 | Disposition: A | Discharge status: Home / Self Care | Payer: Medicare Other | Attending: Emergency Medicine | Admitting: Emergency Medicine

## 2019-09-03 ENCOUNTER — Encounter (HOSPITAL_COMMUNITY): Payer: Self-pay

## 2019-09-03 ENCOUNTER — Other Ambulatory Visit: Payer: Self-pay | Admitting: Nurse Practitioner

## 2019-09-03 ENCOUNTER — Ambulatory Visit: Payer: Medicare Other | Attending: Nurse Practitioner | Admitting: Nurse Practitioner

## 2019-09-03 ENCOUNTER — Emergency Department (HOSPITAL_COMMUNITY)
Admission: EM | Admit: 2019-09-03 | Discharge: 2019-09-05 | Disposition: A | Discharge status: Home / Self Care | Payer: Medicare Other | Source: Home / Self Care | Attending: Emergency Medicine | Admitting: Emergency Medicine

## 2019-09-03 ENCOUNTER — Other Ambulatory Visit: Payer: Self-pay

## 2019-09-03 ENCOUNTER — Encounter (HOSPITAL_COMMUNITY): Payer: Self-pay | Admitting: Emergency Medicine

## 2019-09-03 DIAGNOSIS — J22 Unspecified acute lower respiratory infection: Secondary | ICD-10-CM

## 2019-09-03 DIAGNOSIS — R0789 Other chest pain: Secondary | ICD-10-CM | POA: Insufficient documentation

## 2019-09-03 DIAGNOSIS — I5022 Chronic systolic (congestive) heart failure: Secondary | ICD-10-CM | POA: Insufficient documentation

## 2019-09-03 DIAGNOSIS — R45851 Suicidal ideations: Secondary | ICD-10-CM

## 2019-09-03 DIAGNOSIS — Z20822 Contact with and (suspected) exposure to covid-19: Secondary | ICD-10-CM | POA: Insufficient documentation

## 2019-09-03 DIAGNOSIS — J90 Pleural effusion, not elsewhere classified: Secondary | ICD-10-CM | POA: Insufficient documentation

## 2019-09-03 DIAGNOSIS — Z7901 Long term (current) use of anticoagulants: Secondary | ICD-10-CM | POA: Insufficient documentation

## 2019-09-03 DIAGNOSIS — E119 Type 2 diabetes mellitus without complications: Secondary | ICD-10-CM | POA: Insufficient documentation

## 2019-09-03 DIAGNOSIS — I25118 Atherosclerotic heart disease of native coronary artery with other forms of angina pectoris: Secondary | ICD-10-CM | POA: Insufficient documentation

## 2019-09-03 DIAGNOSIS — I4891 Unspecified atrial fibrillation: Secondary | ICD-10-CM | POA: Insufficient documentation

## 2019-09-03 DIAGNOSIS — J449 Chronic obstructive pulmonary disease, unspecified: Secondary | ICD-10-CM | POA: Insufficient documentation

## 2019-09-03 DIAGNOSIS — R05 Cough: Secondary | ICD-10-CM | POA: Insufficient documentation

## 2019-09-03 DIAGNOSIS — R0602 Shortness of breath: Secondary | ICD-10-CM | POA: Insufficient documentation

## 2019-09-03 DIAGNOSIS — Z5321 Procedure and treatment not carried out due to patient leaving prior to being seen by health care provider: Secondary | ICD-10-CM | POA: Insufficient documentation

## 2019-09-03 DIAGNOSIS — R062 Wheezing: Secondary | ICD-10-CM

## 2019-09-03 DIAGNOSIS — D696 Thrombocytopenia, unspecified: Secondary | ICD-10-CM | POA: Insufficient documentation

## 2019-09-03 DIAGNOSIS — K7460 Unspecified cirrhosis of liver: Secondary | ICD-10-CM | POA: Insufficient documentation

## 2019-09-03 DIAGNOSIS — Z951 Presence of aortocoronary bypass graft: Secondary | ICD-10-CM | POA: Insufficient documentation

## 2019-09-03 DIAGNOSIS — I272 Pulmonary hypertension, unspecified: Secondary | ICD-10-CM | POA: Insufficient documentation

## 2019-09-03 DIAGNOSIS — Z7984 Long term (current) use of oral hypoglycemic drugs: Secondary | ICD-10-CM | POA: Insufficient documentation

## 2019-09-03 DIAGNOSIS — I110 Hypertensive heart disease with heart failure: Secondary | ICD-10-CM | POA: Insufficient documentation

## 2019-09-03 DIAGNOSIS — J4550 Severe persistent asthma, uncomplicated: Secondary | ICD-10-CM | POA: Insufficient documentation

## 2019-09-03 DIAGNOSIS — R059 Cough, unspecified: Secondary | ICD-10-CM

## 2019-09-03 DIAGNOSIS — Z79899 Other long term (current) drug therapy: Secondary | ICD-10-CM | POA: Insufficient documentation

## 2019-09-03 LAB — CBC
HCT: 37.2 % (ref 36.0–46.0)
HCT: 40 % (ref 36.0–46.0)
Hemoglobin: 11.5 g/dL — ABNORMAL LOW (ref 12.0–15.0)
Hemoglobin: 12.4 g/dL (ref 12.0–15.0)
MCH: 25.7 pg — ABNORMAL LOW (ref 26.0–34.0)
MCH: 25.9 pg — ABNORMAL LOW (ref 26.0–34.0)
MCHC: 30.9 g/dL (ref 30.0–36.0)
MCHC: 31 g/dL (ref 30.0–36.0)
MCV: 83 fL (ref 80.0–100.0)
MCV: 83.7 fL (ref 80.0–100.0)
Platelets: 157 10*3/uL (ref 150–400)
Platelets: 147 10*3/uL — ABNORMAL LOW (ref 150–400)
RBC: 4.48 MIL/uL (ref 3.87–5.11)
RBC: 4.78 MIL/uL (ref 3.87–5.11)
RDW: 15.6 % — ABNORMAL HIGH (ref 11.5–15.5)
RDW: 15.8 % — ABNORMAL HIGH (ref 11.5–15.5)
WBC: 7.2 10*3/uL (ref 4.0–10.5)
WBC: 7.9 10*3/uL (ref 4.0–10.5)
nRBC: 0 % (ref 0.0–0.2)
nRBC: 0 % (ref 0.0–0.2)

## 2019-09-03 LAB — ACETAMINOPHEN LEVEL: Acetaminophen (Tylenol), Serum: 14 ug/mL (ref 10–30)

## 2019-09-03 LAB — BASIC METABOLIC PANEL
Anion gap: 10 (ref 5–15)
BUN: 27 mg/dL — ABNORMAL HIGH (ref 8–23)
CO2: 25 mmol/L (ref 22–32)
Calcium: 9.3 mg/dL (ref 8.9–10.3)
Chloride: 106 mmol/L (ref 98–111)
Creatinine, Ser: 1.02 mg/dL — ABNORMAL HIGH (ref 0.44–1.00)
GFR calc Af Amer: >60 mL/min (ref >60)
GFR calc non Af Amer: 55 mL/min — ABNORMAL LOW (ref >60)
Glucose, Bld: 136 mg/dL — ABNORMAL HIGH (ref 70–99)
Potassium: 4 mmol/L (ref 3.5–5.1)
Sodium: 141 mmol/L (ref 135–145)

## 2019-09-03 LAB — TROPONIN I (HIGH SENSITIVITY)
Troponin I (High Sensitivity): 13 ng/L (ref <18)
Troponin I (High Sensitivity): 14 ng/L (ref <18)
Troponin I (High Sensitivity): 11 ng/L (ref <18)

## 2019-09-03 LAB — COMPREHENSIVE METABOLIC PANEL
ALT: 50 U/L — ABNORMAL HIGH (ref 0–44)
AST: 55 U/L — ABNORMAL HIGH (ref 15–41)
Albumin: 4.1 g/dL (ref 3.5–5.0)
Alkaline Phosphatase: 73 U/L (ref 38–126)
Anion gap: 13 (ref 5–15)
BUN: 23 mg/dL (ref 8–23)
CO2: 19 mmol/L — ABNORMAL LOW (ref 22–32)
Calcium: 8.8 mg/dL — ABNORMAL LOW (ref 8.9–10.3)
Chloride: 104 mmol/L (ref 98–111)
Creatinine, Ser: 0.95 mg/dL (ref 0.44–1.00)
GFR calc Af Amer: >60 mL/min (ref >60)
GFR calc non Af Amer: 59 mL/min — ABNORMAL LOW (ref >60)
Glucose, Bld: 209 mg/dL — ABNORMAL HIGH (ref 70–99)
Potassium: 4.1 mmol/L (ref 3.5–5.1)
Sodium: 136 mmol/L (ref 135–145)
Total Bilirubin: 0.9 mg/dL (ref 0.3–1.2)
Total Protein: 7.9 g/dL (ref 6.5–8.1)

## 2019-09-03 LAB — BRAIN NATRIURETIC PEPTIDE: B Natriuretic Peptide: 534.7 pg/mL — ABNORMAL HIGH (ref 0.0–100.0)

## 2019-09-03 LAB — ETHANOL: Alcohol, Ethyl (B): <10 mg/dL (ref <10)

## 2019-09-03 LAB — SARS CORONAVIRUS 2 (TAT 6-24 HRS): SARS Coronavirus 2: NEGATIVE

## 2019-09-03 LAB — SALICYLATE LEVEL: Salicylate Lvl: <7 mg/dL — ABNORMAL LOW (ref 7.0–30.0)

## 2019-09-03 LAB — PROTIME-INR
INR: 1.2 (ref 0.8–1.2)
Prothrombin Time: 14.7 seconds (ref 11.4–15.2)

## 2019-09-03 MED ORDER — ALBUTEROL SULFATE (2.5 MG/3ML) 0.083% IN NEBU
2.5000 mg | INHALATION_SOLUTION | Freq: Once | RESPIRATORY_TRACT | Status: DC
  Filled 2019-09-03: qty 3

## 2019-09-03 MED ORDER — AZITHROMYCIN 250 MG PO TABS
ORAL_TABLET | ORAL | 0 refills | Status: DC
Start: 2019-09-03 — End: 2019-09-11

## 2019-09-03 MED ORDER — DM-GUAIFENESIN ER 30-600 MG PO TB12
1.0000 | ORAL_TABLET | Freq: Two times a day (BID) | ORAL | 0 refills | Status: AC
Start: 2019-09-03 — End: ?

## 2019-09-03 NOTE — ED Triage Notes (Signed)
Pt reports "having corona virus". Pt says she is suicidal because she is tired of hurting. Pt describes shob and generalized body aches.

## 2019-09-03 NOTE — ED Notes (Signed)
Pt had a lengthy conversation with ER staff about the status of the ER/Waiting room and felt it was in her best interest to leave and to seek care at urgent care.

## 2019-09-03 NOTE — ED Triage Notes (Signed)
Pt c/o productive cough w/yellow mucous, nasal drainage, and chest congestionx1 wk.

## 2019-09-03 NOTE — ED Notes (Signed)
Pt refused repeat blood work. Asked by both Tech and phlebotomist.

## 2019-09-03 NOTE — ED Provider Notes (Signed)
[place]    CSN: [id number] Arrival date & time: 09/03/19  1042      History   Chief Complaint Chief Complaint  Patient presents with  . Cough    HPI Sharon Spears is a 74 y.o. female.   HPI  Patient presented to the emergency room this morning.  She had blood work, chest x-ray, and EKG.  She realized how long the wait was going to be so left there without being seen by provider and came to the urgent care center. She tells me that she is "still coughing".  Still has chest pain with deep breath and cough.  Over the last week she has had an increase in her cough and is trying to cough up some yellow sputum.  She has had some chills at home.  Feels more tired.  Is worried she has a chest infection.  States that with her underlying heart and lung disease she does not get better unless she takes an antibiotic.  She called her primary care doctor.  They called in prednisone.  She has been on this for 3 days.  States her wheezing is improved, but the congestion has unchanged. Patient sees pulmonary medicine.  Cardiology.  His primary care doctor.  She is a diabetic with COPD, coronary artery disease, chronic systolic heart failure, hypertension as well as pulmonary hypertension.  She does have chronic cough.  Recurring URI. Lab tests from this morning, chest x-ray, and EKG are reviewed.  All are unchanged from prior  Past Medical History:  Diagnosis Date  . Adjustment disorder with anxiety   . Asthma   . Atrial fibrillation ([place])   . Benign essential hypertension   . COPD (chronic obstructive pulmonary disease) ([place])   . Coronary artery disease   . Diabetic neuropathic arthritis ([place])   . Heart palpitations   . Hypoxia 07/19/2018  . Other cirrhosis of liver ([place]) 07/20/2018  . Uncontrolled diabetes mellitus type 2 without complications     Patient Active Problem List   Diagnosis Date Noted  . Severe persistent asthma 02/14/2019  . Medication monitoring encounter  02/14/2019  . Episode of recurrent major depressive disorder ([place]) 01/24/2019  . Coronary artery disease of native artery of native heart with stable angina pectoris ([place]) 01/24/2019  . Thyroid mass 12/01/2018  . Renal mass 12/01/2018  . Complex care coordination 12/01/2018  . Other cirrhosis of liver ([place]) 07/20/2018  . Unspecified atrial fibrillation ([place]) 07/20/2018  . Cough 02/08/2018  . Dyspnea on exertion 02/08/2018  . Chronic systolic heart failure ([place]) 09/25/2017  . Thrombocytopenia ([place]) 03/20/2015  . Type 2 diabetes mellitus with other specified complication ([place]) 71/69/6789  . HTN (hypertension) 03/20/2015  . S/P CABG (coronary artery bypass graft) 03/20/2015    Past Surgical History:  Procedure Laterality Date  . CARDIAC SURGERY    . CESAREAN SECTION    . CORONARY ARTERY BYPASS GRAFT  07/06/2017   Redo-sternotomy and CABG 2 with LIMA to LAD, SVG to OM, and a a closure, mitral valve repair with #30 mm [place] ring on 07/06/2017  . ESOPHAGEAL MANOMETRY N/A 03/06/2012   Procedure: ESOPHAGEAL MANOMETRY (EM);  Surgeon: Beryle Beams, MD;  Location: [facility];  Service: Endoscopy;  Laterality: N/A;  . IR ANGIOGRAM SELECTIVE EACH ADDITIONAL VESSEL  02/16/2019  . IR ANGIOGRAM SELECTIVE EACH ADDITIONAL VESSEL  02/16/2019  . IR ANGIOGRAM SELECTIVE EACH ADDITIONAL VESSEL  02/16/2019  . IR ANGIOGRAM SELECTIVE EACH ADDITIONAL VESSEL  02/16/2019  . IR  RENAL SELECTIVE  UNI INC S&I MOD SED  02/16/2019  . IR US GUIDE VASC ACCESS RIGHT  02/16/2019  . RIGHT/LEFT HEART CATH AND CORONARY/GRAFT ANGIOGRAPHY N/A 08/07/2019   Procedure: RIGHT/LEFT HEART CATH AND CORONARY/GRAFT ANGIOGRAPHY;  Surgeon: Adrian Prows, MD;  Location: [facility];  Service: Cardiovascular;  Laterality: N/A;  . TONSILLECTOMY      OB History   No obstetric history on file.      Home Medications    Prior to Admission medications   Medication Sig Start Date End Date Taking? Authorizing Provider  acetaminophen  (TYLENOL) 325 MG tablet Take 2 tablets (650 mg total) by mouth every 6 (six) hours as needed. 08/28/19   Darr, Marguerita Beards, PA-C  albuterol (VENTOLIN HFA) 108 (90 Base) MCG/ACT inhaler INHALE 2 PUFFS INTO THE LUNGS EVERY 6 HOURS AS NEEDED FOR WHEEZE Patient taking differently: Inhale 2 puffs into the lungs every 6 (six) hours as needed for wheezing or shortness of breath.  04/09/19   Lauraine Rinne, NP  apixaban (ELIQUIS) 5 MG TABS tablet Take 1 tablet (5 mg total) by mouth 2 (two) times daily. 08/23/19   Adrian Prows, MD  arformoterol (BROVANA) 15 MCG/2ML NEBU Take 2 mLs (15 mcg total) by nebulization 2 (two) times daily. 07/02/19   Elsie Stain, MD  atorvastatin (LIPITOR) 40 MG tablet Take 1 tablet (40 mg total) by mouth daily. 07/03/19   Adrian Prows, MD  azithromycin (ZITHROMAX Z-PAK) 250 MG tablet Take two pills today followed by one a day until gone 09/03/19   Raylene Everts, MD  bisoprolol (ZEBETA) 10 MG tablet Take 0.5 tablets (5 mg total) by mouth daily. 08/17/19   Silvestre Gunner, MD  Blood Glucose Monitoring Suppl (ACCU-CHEK AVIVA PLUS) w/Device KIT USE TO CHECK BLOOD SUGARS ONCE DAILY 10/10/18   [provider]  budesonide (PULMICORT) 0.25 MG/2ML nebulizer solution Take 2 mLs (0.25 mg total) by nebulization 2 (two) times daily. 07/02/19   Elsie Stain, MD  dextromethorphan-guaiFENesin [facility] DM) 30-600 MG 12hr tablet Take 1 tablet by mouth 2 (two) times daily. 09/03/19   Raylene Everts, MD  diphenhydrAMINE (BENADRYL) 12.5 MG/5ML elixir Take 12.5 mg by mouth daily as needed for itching or allergies.  12/05/18   [provider]  famotidine (PEPCID) 20 MG tablet Take 20 mg by mouth 2 (two) times daily.    [provider]  furosemide (LASIX) 20 MG tablet Take 1 tablet (20 mg total) by mouth 2 (two) times daily. Patient taking differently: Take 10 mg by mouth daily.  07/21/18   Samuella Cota, MD  glucose blood (ACCU-CHEK AVIVA PLUS) test strip USE WITH GLUCOMETER  TO CHECK BLOOD SUGARS ONCE DAILY 10/10/18   [provider]  losartan (COZAAR) 100 MG tablet Take 1 tablet (100 mg total) by mouth daily. 06/05/19 09/03/19  Gildardo Pounds, NP  macitentan (OPSUMIT) 10 MG tablet Take 1 tablet (10 mg total) by mouth daily. 08/23/19   Adrian Prows, MD  metFORMIN (GLUCOPHAGE) 500 MG tablet Take 500 mg by mouth daily.    [provider]  montelukast (SINGULAIR) 10 MG tablet Take 10 mg by mouth at bedtime.  07/04/19   [provider]  predniSONE (DELTASONE) 20 MG tablet Take 2 tablets (40 mg total) by mouth daily with breakfast for 5 days. 08/31/19 09/05/19  Gildardo Pounds, NP  sildenafil (REVATIO) 20 MG tablet Take 1 tablet (20 mg total) by mouth 3 (three) times daily. 08/23/19  Adrian Prows, MD    Family History Family History  Problem Relation Age of Onset  . Asthma Mother   . Alcohol abuse Father     Social History Social History   Tobacco Use  . Smoking status: Never Smoker  . Smokeless tobacco: Never Used  Vaping Use  . Vaping Use: Never used  Substance Use Topics  . Alcohol use: No  . Drug use: Never     Allergies   Contrast media [iodinated diagnostic agents], Adhesive [tape], and Penicillins   Review of Systems Review of Systems See HPI  Physical Exam Triage Vital Signs ED Triage Vitals  Enc Vitals Group     BP 09/03/19 1210 (!) 180/59     Pulse Rate 09/03/19 1210 82     Resp 09/03/19 1210 18     Temp 09/03/19 1210 98.9 F (37.2 C)     Temp Source 09/03/19 1210 Oral     SpO2 09/03/19 1210 96 %     Weight 09/03/19 1211 151 lb (68.5 kg)     Height 09/03/19 1211 _0  (1.6 m)     Head Circumference --      Peak Flow --      Pain Score 09/03/19 1210 8     Pain Loc --      Pain Edu? --      Excl. in [place]? --    No data found.  Updated Vital Signs BP (!) 180/59   Pulse 82   Temp 98.9 F (37.2 C) (Oral)   Resp 18   Ht _1  (1.6 m)   Wt 68.5 kg   SpO2 96%   BMI 26.75 kg/m      Physical  Exam Constitutional:      General: She is not in acute distress.    Appearance: Normal appearance. She is well-developed.     Comments: Patient is in no distress.  Speaking in full sentences.  HENT:     Head: Normocephalic and atraumatic.     Nose: No rhinorrhea.     Mouth/Throat:     Mouth: Mucous membranes are dry.     Pharynx: No posterior oropharyngeal erythema.     Comments: Mucous membranes are slightly dry. Eyes:     Conjunctiva/sclera: Conjunctivae normal.     Pupils: Pupils are equal, round, and reactive to light.  Cardiovascular:     Rate and Rhythm: Normal rate and regular rhythm.     Heart sounds: Normal heart sounds.  Pulmonary:     Effort: Pulmonary effort is normal. No respiratory distress.     Comments: Breath sounds slightly diminished.  Few rhonchi anteriorly.  No wheeze or rales Abdominal:     General: There is no distension.     Palpations: Abdomen is soft.  Musculoskeletal:        General: Normal range of motion.     Cervical back: Normal range of motion.  Lymphadenopathy:     Cervical: No cervical adenopathy.  Skin:    General: Skin is warm and dry.  Neurological:     Mental Status: She is alert.  Psychiatric:        Mood and Affect: Mood normal.        Behavior: Behavior normal.      UC Treatments / Results  Labs (all labs ordered are listed, but only abnormal results are displayed) Labs Reviewed  SARS CORONAVIRUS 2 (TAT 6-24 HRS)    EKG   Radiology DG Chest 2 View  Result Date: 09/03/2019 CLINICAL DATA:  Chest pain EXAM: CHEST - 2 VIEW COMPARISON:  08/28/2019 FINDINGS: Cardiac enlargement. Previous median sternotomy and CABG procedure. Small chronic left pleural effusion identified. Mild interstitial edema. Scar like densities are again noted within both lower lung zones. IMPRESSION: 1. Mild CHF. 2. Bibasilar scarring. Electronically Signed   By: Kerby Moors M.D.   On: 09/03/2019 05:17    Procedures Procedures (including critical  care time)  Medications Ordered in UC Medications - No data to display  Initial Impression / Assessment and Plan / UC Course  I have reviewed the triage vital signs and the nursing notes.  Pertinent labs & imaging results that were available during my care of the patient were reviewed by me and considered in my medical decision making (see chart for details).     Patient tells me she has not had antibiotics "in a year".  Chart review reveals differently.  She had antibiotics and July.  She specifically requests azithromycin.  This is been used for the last couple infections and is successful for her.  I told her that at times changing antibiotics is useful.  Patient does not open to this suggestion.  She is encouraged to follow-up with her pulmonary doctor. Final Clinical Impressions(s) / UC Diagnoses   Final diagnoses:  LRTI (lower respiratory tract infection)  Cough     Discharge Instructions     Take the azithromycin antibiotic as directed.  2 pills today then 1 a day until gone Continue to drink plenty of fluids. Use your inhalers as directed Take the guaifenesin/dextromethorphan medicine 2 times a day.  This will help loosen mucus and reduce your cough Follow-up with your pulmonary doctor   ED Prescriptions    Medication Sig Dispense Auth. Provider   azithromycin (ZITHROMAX Z-PAK) 250 MG tablet Take two pills today followed by one a day until gone 6 tablet Raylene Everts, MD   dextromethorphan-guaiFENesin [facility] DM) 30-600 MG 12hr tablet Take 1 tablet by mouth 2 (two) times daily. 20 tablet Raylene Everts, MD     PDMP not reviewed this encounter.   Raylene Everts, MD 09/03/19 1315

## 2019-09-03 NOTE — Discharge Instructions (Signed)
Take the azithromycin antibiotic as directed.  2 pills today then 1 a day until gone Continue to drink plenty of fluids. Use your inhalers as directed Take the guaifenesin/dextromethorphan medicine 2 times a day.  This will help loosen mucus and reduce your cough Follow-up with your pulmonary doctor

## 2019-09-03 NOTE — ED Notes (Signed)
Patient placed on 2L [place] for comfort/pulmonary hypertension per Triage RN

## 2019-09-03 NOTE — ED Triage Notes (Signed)
Patient arrived with EMS from home reports central chest pain with SOB onset last night , no emesis or diaphoresis , denies cough or fever . History of COPD/Afib.

## 2019-09-04 DIAGNOSIS — R0602 Shortness of breath: Secondary | ICD-10-CM | POA: Diagnosis not present

## 2019-09-04 MED ORDER — MONTELUKAST SODIUM 10 MG PO TABS
10.0000 mg | ORAL_TABLET | Freq: Every day | ORAL | Status: DC
  Administered 2019-09-04: 10 mg via ORAL
  Filled 2019-09-04 (×2): qty 1

## 2019-09-04 MED ORDER — METFORMIN HCL 500 MG PO TABS
500.0000 mg | ORAL_TABLET | Freq: Every day | ORAL | Status: DC
  Administered 2019-09-04 – 2019-09-05 (×2): 500 mg via ORAL
  Filled 2019-09-04 (×2): qty 1

## 2019-09-04 MED ORDER — APIXABAN 5 MG PO TABS
5.0000 mg | ORAL_TABLET | Freq: Two times a day (BID) | ORAL | Status: DC
  Administered 2019-09-04 – 2019-09-05 (×3): 5 mg via ORAL
  Filled 2019-09-04 (×5): qty 1

## 2019-09-04 MED ORDER — ACETAMINOPHEN 325 MG PO TABS
650.0000 mg | ORAL_TABLET | ORAL | Status: DC | PRN
  Filled 2019-09-04 (×2): qty 2

## 2019-09-04 MED ORDER — ALBUTEROL SULFATE HFA 108 (90 BASE) MCG/ACT IN AERS: 2.0000 | INHALATION_SPRAY | Freq: Four times a day (QID) | RESPIRATORY_TRACT | Status: DC | PRN

## 2019-09-04 MED ORDER — FUROSEMIDE 40 MG PO TABS
20.0000 mg | ORAL_TABLET | Freq: Two times a day (BID) | ORAL | Status: DC
  Administered 2019-09-04 – 2019-09-05 (×3): 20 mg via ORAL
  Filled 2019-09-04 (×3): qty 1

## 2019-09-04 MED ORDER — PREDNISONE 20 MG PO TABS
40.0000 mg | ORAL_TABLET | Freq: Every day | ORAL | Status: DC
  Administered 2019-09-04 – 2019-09-05 (×2): 40 mg via ORAL
  Filled 2019-09-04 (×2): qty 2

## 2019-09-04 MED ORDER — BUDESONIDE 0.25 MG/2ML IN SUSP
0.2500 mg | Freq: Two times a day (BID) | RESPIRATORY_TRACT | Status: DC
  Administered 2019-09-04 – 2019-09-05 (×2): 0.25 mg via RESPIRATORY_TRACT
  Filled 2019-09-04 (×2): qty 2

## 2019-09-04 MED ORDER — ACETAMINOPHEN 325 MG PO TABS
650.0000 mg | ORAL_TABLET | Freq: Four times a day (QID) | ORAL | Status: DC | PRN
  Administered 2019-09-05: 650 mg via ORAL

## 2019-09-04 MED ORDER — ATORVASTATIN CALCIUM 40 MG PO TABS
40.0000 mg | ORAL_TABLET | Freq: Every day | ORAL | Status: DC
  Filled 2019-09-04: qty 1

## 2019-09-04 MED ORDER — ALUM & MAG HYDROXIDE-SIMETH 200-200-20 MG/5ML PO SUSP: 30.0000 mL | Freq: Four times a day (QID) | ORAL | Status: DC | PRN

## 2019-09-04 MED ORDER — ARFORMOTEROL TARTRATE 15 MCG/2ML IN NEBU
15.0000 ug | INHALATION_SOLUTION | Freq: Two times a day (BID) | RESPIRATORY_TRACT | Status: DC
  Administered 2019-09-04 – 2019-09-05 (×2): 15 ug via RESPIRATORY_TRACT
  Filled 2019-09-04 (×4): qty 2

## 2019-09-04 NOTE — BH Assessment (Signed)
Clinician and pt's nurse, Idelle Jo, attempted to complete pt's BH Assessment 2x but the Tele-Assessment machine would not stop buffering, making seeing and hearing pt impossible. Will attempt at a later time.

## 2019-09-04 NOTE — ED Notes (Signed)
Patient refusing vital signs. Pt asks for tylenol and then refusing it.

## 2019-09-04 NOTE — ED Notes (Signed)
Writer refuses to stick patient for TIMED Trop, tried multiple times to explain to the patient why collecting her blood again was necessary and why isn't a cardiologist not down here explaining to her why Troponin I needs to be repeated and she continued to go on about her nighttime meds, asking for them saying she needs to take them, then proceeds too ask why she was not given an IV so blood work could be collected

## 2019-09-04 NOTE — ED Provider Notes (Signed)
[facility] DEPT Provider Note   CSN: [id number] Arrival date & time: 09/03/19  2112     History Chief Complaint  Patient presents with  . Generalized Body Aches  . Suicidal    Sharon Spears is a 74 y.o. female with history of atrial fibrillation on Eliquis, CAD s/p CABG, COPD, anxiety, asthma who presents to the emergency department with a chief complaint of shortness of breath.  The patient endorses cough, shortness of breath that has worsened over the last week.  She reports a fever of 101 at home earlier today, but has been taking 1000g of Tylenol once every 6 hours.  She has not been taking additional doses of Tylenol.    She reports associated chills. Patient has been fully vaccinated against COVID-19 since March 2021, but is concerned that she may have COVID-19.  She initially presented to the ER earlier today, but left prior to being evaluated by a clinician.  She then went to urgent care where a COVID-19 test was ordered, but she has not yet received the results.  Reports that she has been feeling more fatigued for some time.  Reports that she has had some intermittent "pulling" chest pain in the center of her chest that will come and go.  No chest pain at this time.  She reports that she has had some swelling in her bilateral lower extremities and has had a 5 pound weight gain over the last month.  No loss of sense of taste or smell, abdominal pain, nausea, vomiting, diarrhea, rash, headache, back pain, dizziness, lightheadedness, or orthopnea.   She is followed by pulmonology, cardiology, and primary care.  She was started on azithromycin by urgent care today and has taken 1 dose.  She was also seen by primary care, per chart review, and was started on a short burst of prednisone and has completed 3 days.  Reports that she has been feeling more suicidal for some time due to her chronic medical conditions, but this is worsened recently.  She is  tearful and crying.  She denies HI or auditory visual hallucinations.  Reports a history of a previous suicide attempt where she overdosed on blood pressure medications.  She denies having a plan at this time.  Sharon Spears was evaluated in Emergency Department on 09/04/2019 for the symptoms described in the history of present illness. She was evaluated in the context of the global COVID-19 pandemic, which necessitated consideration that the patient might be at risk for infection with the SARS-CoV-2 virus that causes COVID-19. Institutional protocols and algorithms that pertain to the evaluation of patients at risk for COVID-19 are in a state of rapid change based on information released by regulatory bodies including the CDC and federal and state organizations. These policies and algorithms were followed during the patient's care in the ED.   The history is provided by the patient. No language interpreter was used.       Past Medical History:  Diagnosis Date  . Adjustment disorder with anxiety   . Asthma   . Atrial fibrillation ([place])   . Benign essential hypertension   . COPD (chronic obstructive pulmonary disease) ([place])   . Coronary artery disease   . Diabetic neuropathic arthritis ([place])   . Heart palpitations   . Hypoxia 07/19/2018  . Other cirrhosis of liver ([place]) 07/20/2018  . Uncontrolled diabetes mellitus type 2 without complications     Patient Active Problem List   Diagnosis Date Noted  .  Severe persistent asthma 02/14/2019  . Medication monitoring encounter 02/14/2019  . Episode of recurrent major depressive disorder ([place]) 01/24/2019  . Coronary artery disease of native artery of native heart with stable angina pectoris ([place]) 01/24/2019  . Thyroid mass 12/01/2018  . Renal mass 12/01/2018  . Complex care coordination 12/01/2018  . Other cirrhosis of liver ([place]) 07/20/2018  . Unspecified atrial fibrillation ([place]) 07/20/2018  . Cough 02/08/2018  . Dyspnea on exertion  02/08/2018  . Chronic systolic heart failure ([place]) 09/25/2017  . Thrombocytopenia ([place]) 03/20/2015  . Type 2 diabetes mellitus with other specified complication ([place]) 83/38/2505  . HTN (hypertension) 03/20/2015  . S/P CABG (coronary artery bypass graft) 03/20/2015    Past Surgical History:  Procedure Laterality Date  . CARDIAC SURGERY    . CESAREAN SECTION    . CORONARY ARTERY BYPASS GRAFT  07/06/2017   Redo-sternotomy and CABG 2 with LIMA to LAD, SVG to OM, and a a closure, mitral valve repair with #30 mm [place] ring on 07/06/2017  . ESOPHAGEAL MANOMETRY N/A 03/06/2012   Procedure: ESOPHAGEAL MANOMETRY (EM);  Surgeon: Beryle Beams, MD;  Location: [facility] ENDOSCOPY;  Service: Endoscopy;  Laterality: N/A;  . IR ANGIOGRAM SELECTIVE EACH ADDITIONAL VESSEL  02/16/2019  . IR ANGIOGRAM SELECTIVE EACH ADDITIONAL VESSEL  02/16/2019  . IR ANGIOGRAM SELECTIVE EACH ADDITIONAL VESSEL  02/16/2019  . IR ANGIOGRAM SELECTIVE EACH ADDITIONAL VESSEL  02/16/2019  . IR RENAL SELECTIVE  UNI INC S&I MOD SED  02/16/2019  . IR US GUIDE VASC ACCESS RIGHT  02/16/2019  . RIGHT/LEFT HEART CATH AND CORONARY/GRAFT ANGIOGRAPHY N/A 08/07/2019   Procedure: RIGHT/LEFT HEART CATH AND CORONARY/GRAFT ANGIOGRAPHY;  Surgeon: Adrian Prows, MD;  Location: [facility];  Service: Cardiovascular;  Laterality: N/A;  . TONSILLECTOMY       OB History   No obstetric history on file.     Family History  Problem Relation Age of Onset  . Asthma Mother   . Alcohol abuse Father     Social History   Tobacco Use  . Smoking status: Never Smoker  . Smokeless tobacco: Never Used  Vaping Use  . Vaping Use: Never used  Substance Use Topics  . Alcohol use: No  . Drug use: Never    Home Medications Prior to Admission medications   Medication Sig Start Date End Date Taking? Authorizing Provider  acetaminophen (TYLENOL) 325 MG tablet Take 2 tablets (650 mg total) by mouth every 6 (six) hours as needed. 08/28/19   Darr, Marguerita Beards, PA-C    albuterol (VENTOLIN HFA) 108 (90 Base) MCG/ACT inhaler INHALE 2 PUFFS INTO THE LUNGS EVERY 6 HOURS AS NEEDED FOR WHEEZE Patient taking differently: Inhale 2 puffs into the lungs every 6 (six) hours as needed for wheezing or shortness of breath.  04/09/19   Lauraine Rinne, NP  apixaban (ELIQUIS) 5 MG TABS tablet Take 1 tablet (5 mg total) by mouth 2 (two) times daily. 08/23/19   Adrian Prows, MD  arformoterol (BROVANA) 15 MCG/2ML NEBU Take 2 mLs (15 mcg total) by nebulization 2 (two) times daily. 07/02/19   Elsie Stain, MD  atorvastatin (LIPITOR) 40 MG tablet Take 1 tablet (40 mg total) by mouth daily. 07/03/19   Adrian Prows, MD  azithromycin (ZITHROMAX Z-PAK) 250 MG tablet Take two pills today followed by one a day until gone 09/03/19   Raylene Everts, MD  bisoprolol (ZEBETA) 10 MG tablet Take 0.5 tablets (5 mg total) by mouth daily. 08/17/19  Silvestre Gunner, MD  Blood Glucose Monitoring Suppl (ACCU-CHEK AVIVA PLUS) w/Device KIT USE TO CHECK BLOOD SUGARS ONCE DAILY 10/10/18   [provider]  budesonide (PULMICORT) 0.25 MG/2ML nebulizer solution Take 2 mLs (0.25 mg total) by nebulization 2 (two) times daily. 07/02/19   Elsie Stain, MD  dextromethorphan-guaiFENesin [facility] DM) 30-600 MG 12hr tablet Take 1 tablet by mouth 2 (two) times daily. 09/03/19   Raylene Everts, MD  diphenhydrAMINE (BENADRYL) 12.5 MG/5ML elixir Take 12.5 mg by mouth daily as needed for itching or allergies.  12/05/18   [provider]  famotidine (PEPCID) 20 MG tablet Take 20 mg by mouth 2 (two) times daily.    [provider]  furosemide (LASIX) 20 MG tablet Take 1 tablet (20 mg total) by mouth 2 (two) times daily. Patient taking differently: Take 10 mg by mouth daily.  07/21/18   Samuella Cota, MD  glucose blood (ACCU-CHEK AVIVA PLUS) test strip USE WITH GLUCOMETER TO CHECK BLOOD SUGARS ONCE DAILY 10/10/18   [provider]  losartan (COZAAR) 100 MG tablet Take 1 tablet (100 mg  total) by mouth daily. 06/05/19 09/03/19  Gildardo Pounds, NP  macitentan (OPSUMIT) 10 MG tablet Take 1 tablet (10 mg total) by mouth daily. 08/23/19   Adrian Prows, MD  metFORMIN (GLUCOPHAGE) 500 MG tablet Take 500 mg by mouth daily.    [provider]  montelukast (SINGULAIR) 10 MG tablet Take 10 mg by mouth at bedtime.  07/04/19   [provider]  predniSONE (DELTASONE) 20 MG tablet Take 2 tablets (40 mg total) by mouth daily with breakfast for 5 days. 08/31/19 09/05/19  Gildardo Pounds, NP  sildenafil (REVATIO) 20 MG tablet Take 1 tablet (20 mg total) by mouth 3 (three) times daily. 08/23/19   Adrian Prows, MD    Allergies    Contrast media [iodinated diagnostic agents], Adhesive [tape], and Penicillins  Review of Systems   Review of Systems  Constitutional: Positive for chills, fatigue and fever. Negative for activity change.  HENT: Negative for congestion and sore throat.   Eyes: Negative for visual disturbance.  Respiratory: Positive for cough and shortness of breath.   Cardiovascular: Positive for leg swelling. Negative for chest pain.  Gastrointestinal: Negative for abdominal pain, diarrhea, nausea and vomiting.  Genitourinary: Negative for dysuria.  Musculoskeletal: Positive for myalgias. Negative for back pain.  Skin: Negative for rash.  Allergic/Immunologic: Negative for immunocompromised state.  Neurological: Negative for dizziness, syncope, weakness, numbness and headaches.  Psychiatric/Behavioral: Positive for suicidal ideas. Negative for confusion and hallucinations.    Physical Exam Updated Vital Signs BP (!) 158/75 (BP Location: Left Arm)   Pulse 90   Temp 99.1 F (37.3 C) (Oral)   Resp 19   SpO2 94%   Physical Exam Vitals and nursing note reviewed.  Constitutional:      General: She is not in acute distress.    Appearance: She is not ill-appearing, toxic-appearing or diaphoretic.     Comments: No acute distress. Tearful.  HENT:     Head:  Normocephalic.     Mouth/Throat:     Mouth: Mucous membranes are moist.     Pharynx: No oropharyngeal exudate or posterior oropharyngeal erythema.  Eyes:     Conjunctiva/sclera: Conjunctivae normal.  Cardiovascular:     Rate and Rhythm: Normal rate and regular rhythm.     Heart sounds: No murmur heard.  No friction rub. No gallop.   Pulmonary:     Effort:  Pulmonary effort is normal. No respiratory distress.     Comments: Scattered wheezes bilaterally that clear with coughing.  Patient is able to speak in complete, fluent sentences without increased work of breathing.  No retractions or accessory muscle use.  Good air movement throughout. Abdominal:     General: There is no distension.     Palpations: Abdomen is soft. There is no mass.     Tenderness: There is no abdominal tenderness. There is no right CVA tenderness, left CVA tenderness, guarding or rebound.     Hernia: No hernia is present.  Musculoskeletal:     Cervical back: Normal range of motion and neck supple.     Right lower leg: Edema present.     Left lower leg: Edema present.     Comments: 1+ peripheral edema bilaterally.  Skin:    General: Skin is warm.     Findings: No rash.  Neurological:     General: No focal deficit present.     Mental Status: She is alert.  Psychiatric:        Attention and Perception: She does not perceive auditory or visual hallucinations.        Mood and Affect: Affect is tearful.        Speech: Speech normal. She is communicative. Speech is not rapid and pressured, delayed, slurred or tangential.        Behavior: Behavior normal.        Thought Content: Thought content is not paranoid or delusional. Thought content includes suicidal ideation. Thought content does not include homicidal ideation. Thought content does not include homicidal plan.        Judgment: Judgment is impulsive.     ED Results / Procedures / Treatments   Labs (all labs ordered are listed, but only abnormal results are  displayed) Labs Reviewed  COMPREHENSIVE METABOLIC PANEL - Abnormal; Notable for the following components:      Result Value   CO2 19 (*)    Glucose, Bld 209 (*)    Calcium 8.8 (*)    AST 55 (*)    ALT 50 (*)    GFR calc non Af Amer 59 (*)    All other components within normal limits  SALICYLATE LEVEL - Abnormal; Notable for the following components:   Salicylate Lvl <9.9 (*)    All other components within normal limits  CBC - Abnormal; Notable for the following components:   MCH 25.9 (*)    RDW 15.8 (*)    Platelets 147 (*)    All other components within normal limits  BRAIN NATRIURETIC PEPTIDE - Abnormal; Notable for the following components:   B Natriuretic Peptide 534.7 (*)    All other components within normal limits  ETHANOL  ACETAMINOPHEN LEVEL  RAPID URINE DRUG SCREEN, HOSP PERFORMED  TROPONIN I (HIGH SENSITIVITY)    EKG None  Radiology DG Chest 2 View  Result Date: 09/03/2019 CLINICAL DATA:  Chest pain EXAM: CHEST - 2 VIEW COMPARISON:  08/28/2019 FINDINGS: Cardiac enlargement. Previous median sternotomy and CABG procedure. Small chronic left pleural effusion identified. Mild interstitial edema. Scar like densities are again noted within both lower lung zones. IMPRESSION: 1. Mild CHF. 2. Bibasilar scarring. Electronically Signed   By: Kerby Moors M.D.   On: 09/03/2019 05:17    Procedures Procedures (including critical care time)  Medications Ordered in ED Medications  albuterol (PROVENTIL) (2.5 MG/3ML) 0.083% nebulizer solution 2.5 mg (2.5 mg Nebulization Refused 09/04/19 0009)  albuterol (VENTOLIN HFA)  108 (90 Base) MCG/ACT inhaler 2 puff (has no administration in time range)  apixaban (ELIQUIS) tablet 5 mg (has no administration in time range)  arformoterol (BROVANA) nebulizer solution 15 mcg (has no administration in time range)  atorvastatin (LIPITOR) tablet 40 mg (has no administration in time range)  budesonide (PULMICORT) nebulizer solution 0.25 mg (0.25  mg Nebulization Refused 09/04/19 0558)  furosemide (LASIX) tablet 20 mg (has no administration in time range)  predniSONE (DELTASONE) tablet 40 mg (has no administration in time range)  metFORMIN (GLUCOPHAGE) tablet 500 mg (has no administration in time range)  acetaminophen (TYLENOL) tablet 650 mg (has no administration in time range)  alum & mag hydroxide-simeth (MAALOX/MYLANTA) 200-200-20 MG/5ML suspension 30 mL (has no administration in time range)  acetaminophen (TYLENOL) tablet 650 mg (has no administration in time range)  montelukast (SINGULAIR) tablet 10 mg (has no administration in time range)    ED Course  I have reviewed the triage vital signs and the nursing notes.  Pertinent labs & imaging results that were available during my care of the patient were reviewed by me and considered in my medical decision making (see chart for details).  Clinical Course as of Sep 04 618  Tue Sep 04, 2019  0537 While checking on another patient, I found the patient to be out in the hallway talking to EMS.  I encouraged the patient to return to her room.  States that she is frustrated because she has not received her nighttime dose of medications.  I discussed with the patient that her medications have not been confirmed with the pharmacy tech because when the staff member presented to her room she said she was too hungry to talk.  The patient reports that she is angry with staff and would like to go home.  I discussed with the patient that she needs to be cleared by behavioral health.  States "Can't people just make a slip of the tongue sometimes.  I think everyone says that they want to kill themselves from time to time, but it is really just about the pain.  If I was given my nighttime dose of Tylenol then maybe I could get some sleep and would not be hurting so much."  Discussed with the patient that since that she made suicidal comments to several staff members that she would need to be cleared by  behavioral health.  Patient remains voluntary at this time and is agreeable to staying for evaluation.  Her home medication orders have been re-evaluated.   [MM]    Clinical Course User Index [MM] Demeco Ducksworth, Laymond Purser, PA-C   MDM Rules/Calculators/A&P                          74 year old female with history of atrial fibrillation on Eliquis, CAD s/p CABG, COPD, anxiety, asthma who presents with continued shortness of breath and cough after being evaluated for the same at urgent care earlier today.  However, patient is also endorsing suicidal ideation.  Patient was seen and independently evaluated by Dr. Darl Householder who is in agreement with the work-up and plan.  Will medically clear the patient for chest pain or shortness of breath and plan for patient to be cleared by psychiatry prior to discharge.  She is voluntary at this time.  Delta troponin is not elevated.  [facility] earlier today when patient left without being seen as patient is been having no new symptoms.  No  evidence of infarction.  Chest x-ray with mild CHF and patient with 1+ pitting edema on exam.  She does endorse a 5 pound weight gain over the last month, but BNP is ~500.  We will give her home dose of Lasix.  She has no metabolic derangements.  Labs are otherwise reassuring.  Doubt PE, aortic dissection, acute on chronic CHF exacerbation requiring hospitalization, or COPD exacerbation.  Patient has been ambulating around the department without tachypnea or hypoxia.  She is well-appearing and in no acute distress.  She is medically cleared at this time.  She had a negative COVID-19 test earlier in the day at urgent care.,  Getting ready to head away.  Psych hold orders and home med orders placed. TTS consult pending; please see psych team notes for further documentation of care/dispo. Pt stable at time of med clearance.  Patient care transferred to Dr. Dayna Barker at the end of my shift is TTS consult is pending.  Since arrival to  the ER, patient has refused multiple interventions, including her home Tylenol (after requesting the medical and yelling at staff), albuterol nebulizer.  Patient remains voluntary at this time.  She endorsed suicidal ideation to multiple staff members and was initially very tearful on exam.  She does report a history of intentional overdose on her home blood pressure medication.  She will need to be cleared by behavioral health prior to discharge.  Although patient is agreeable with behavioral health evaluation at this time, if she attempts to leave prior to evaluation, she will need to be IVC need for personal safety.  Patient presentation, ED course, and plan of care discussed with review of all pertinent labs and imaging. Please see his/her note for further details regarding further ED course and disposition.    Final Clinical Impression(s) / ED Diagnoses Final diagnoses:  Suicidal ideation  Wheezing    Rx / DC Orders ED Discharge Orders    None       McDonald, Mia A, PA-C 09/04/19 0620    Drenda Freeze, MD 09/04/19 [phone]

## 2019-09-04 NOTE — BH Assessment (Signed)
Called to assess patient.  Per RN, she just fell asleep.  TTS to attempt to assess at a later time.

## 2019-09-05 ENCOUNTER — Encounter (HOSPITAL_COMMUNITY): Payer: Self-pay | Admitting: Registered Nurse

## 2019-09-05 ENCOUNTER — Ambulatory Visit: Payer: Medicare Other | Admitting: Primary Care

## 2019-09-05 DIAGNOSIS — R0602 Shortness of breath: Secondary | ICD-10-CM | POA: Diagnosis not present

## 2019-09-05 LAB — RAPID URINE DRUG SCREEN, HOSP PERFORMED
Amphetamines: NOT DETECTED
Barbiturates: NOT DETECTED
Benzodiazepines: NOT DETECTED
Cocaine: NOT DETECTED
Opiates: NOT DETECTED
Tetrahydrocannabinol: NOT DETECTED

## 2019-09-05 MED ORDER — LORAZEPAM 0.5 MG PO TABS
0.5000 mg | ORAL_TABLET | Freq: Once | ORAL | Status: AC
  Administered 2019-09-05: 0.5 mg via ORAL
  Filled 2019-09-05: qty 1

## 2019-09-05 NOTE — ED Notes (Signed)
Daughter [name] will be here to get Mom in 77 mins.

## 2019-09-05 NOTE — ED Notes (Signed)
Pts belongs at nurse's station in front of rm 11

## 2019-09-05 NOTE — ED Provider Notes (Signed)
TTS as contacted nurse after consulting with patient, and states that she can be discharged.  Patient has continued to assert that she is no longer suicidal, and does not have active suicidal plan.  She is anxious to go home and get out of the hallway bed, so that she will not be exposed to Covid.  There does not appear to be any indication to keep the patient here at this time.   Daleen Bo, MD 09/05/19 [phone]

## 2019-09-05 NOTE — ED Notes (Signed)
Patient keeps wondering all over the halls, has had to be asked several times to sit back down on her bed.  Attempting to come behind the nursing station.

## 2019-09-05 NOTE — Discharge Instructions (Addendum)
Make sure you contact your primary care provider, or therapist if you continue to have suicidal thoughts.  Make sure you are taking all of your medicines as prescribed.  Make sure that you are taking all of your medicines as prescribed.  Return here, if needed, for problems.

## 2019-09-05 NOTE — Consult Note (Addendum)
  Called [facility] ED charge nurse to have patient move to a room and tele psych machine set up to perform psychiatric assessment.     Addendum 4:06 PM I have attempted to call machine twice and no answer.   TTS re-saw patient and patient continues to deny suicidal/homicidal ideation, psychosis, and paranoia.  Patient was distraught because she thought she may have COVID; Now she is in the hall and is afraid she may actually catch COVID.  Gave permission to speak with her daughter for collateral information.  Daughter feels patient safe for discharge home and will pick her up.     Disposition:  Psychiatrically cleared No evidence of imminent risk to self or others at present.   Patient does not meet criteria for psychiatric inpatient admission. Supportive therapy provided about ongoing stressors. Discussed crisis plan, support from social network, calling 911, coming to the Emergency Department, and calling Suicide Hotline.
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
## 2019-09-05 NOTE — BH Assessment (Signed)
Reassessment Note: Pt presents sitting upright in chair. She is speaking quickly and has a lot to say. Pt denies SI, "no way". She explains what she said must have been a "slip of the tongue" or something because she did not intent to say she wanted to kill herself. Pt states she is worried about all the germs she is being exposed to while being in a bed in the hallway with people having Covid. She states she misses her home & she is ready to go. Pt denies HI and AVH.   Pt asked if this writer would phone her daughter, Katy Fitch [phone] to ask her not to visit so daughter would not be unnecessarily exposed to hospital germs. By phone, Sharon Spears states she doesn't understand why her mother is being kept in the hospital and that she "can't see my mother wanting to kill herself about being upset about being sick". Sharon Spears states her mother has plenty of issues (anxiety, talking too much, blurts) but she does not think pt is a danger to herself. Dtr also concerned about her mother with COPD in an ED hall bed potentially being exposed to Covid. Sharon Rankin, NP recommend psych clearance. Phone call returned to dtr. She states she can pick mother up from ED when notified pt is ready.

## 2019-09-06 ENCOUNTER — Telehealth: Payer: Self-pay | Admitting: Pulmonary Disease

## 2019-09-06 ENCOUNTER — Ambulatory Visit: Payer: Medicare Other | Admitting: Cardiology

## 2019-09-06 NOTE — Telephone Encounter (Signed)
lmtcb for pt.  

## 2019-09-06 NOTE — Telephone Encounter (Signed)
Pt returning call.  [phone]

## 2019-09-06 NOTE — Telephone Encounter (Signed)
Tried calling the pt and she did not answer- LMTCB

## 2019-09-06 NOTE — Telephone Encounter (Signed)
Called spoke with patient She was just released from the hospital 09/05/19 pm.   Patient states they did not walk her around in the hospital because of the pandemic and now she is short of breath when she walks from her bed to the bathroom. I asked patient to walk and get her pulse ox and then put on her finger, patient went to get pulse ox and came back to phone put on finger her O2 sat was 93%. Patient states she wants oxygen and they tell her she just needs an order. Patient has not been walked in hospital while on oxygen. I told patient we would need to have her come in and do a walk and have an appointment. Patient agreed. j  Patient also states she is currently on an Antibiotic Azithromycin and Prednisone. I explained to her that if her oxygen sats dropped below 89 she needed to call 911. Patient very upset did not want to go back to hospital. I told her again if her oxygen dropped below 89% she needed to call 911. She said okay and I made patient an appointment to qualify for oxygen on 09/10/19 at 4:30 with TP   Nothing further needed at this time.

## 2019-09-07 ENCOUNTER — Ambulatory Visit: Payer: Self-pay | Admitting: *Deleted
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
## 2019-09-08 ENCOUNTER — Inpatient Hospital Stay (HOSPITAL_COMMUNITY)
Admission: EM | Admit: 2019-09-08 | Discharge: 2019-09-11 | DRG: 193 | Disposition: A | Discharge status: Home / Self Care | Payer: Medicare Other | Attending: Family Medicine | Admitting: Family Medicine

## 2019-09-08 ENCOUNTER — Other Ambulatory Visit: Payer: Self-pay

## 2019-09-08 ENCOUNTER — Emergency Department (HOSPITAL_COMMUNITY): Payer: Medicare Other

## 2019-09-08 ENCOUNTER — Encounter (HOSPITAL_COMMUNITY): Payer: Self-pay | Admitting: Emergency Medicine

## 2019-09-08 DIAGNOSIS — Z825 Family history of asthma and other chronic lower respiratory diseases: Secondary | ICD-10-CM

## 2019-09-08 DIAGNOSIS — K7469 Other cirrhosis of liver: Secondary | ICD-10-CM | POA: Diagnosis present

## 2019-09-08 DIAGNOSIS — E785 Hyperlipidemia, unspecified: Secondary | ICD-10-CM | POA: Diagnosis present

## 2019-09-08 DIAGNOSIS — J441 Chronic obstructive pulmonary disease with (acute) exacerbation: Secondary | ICD-10-CM | POA: Diagnosis present

## 2019-09-08 DIAGNOSIS — Z91041 Radiographic dye allergy status: Secondary | ICD-10-CM

## 2019-09-08 DIAGNOSIS — Z951 Presence of aortocoronary bypass graft: Secondary | ICD-10-CM

## 2019-09-08 DIAGNOSIS — J9621 Acute and chronic respiratory failure with hypoxia: Secondary | ICD-10-CM | POA: Diagnosis present

## 2019-09-08 DIAGNOSIS — M7989 Other specified soft tissue disorders: Secondary | ICD-10-CM | POA: Diagnosis present

## 2019-09-08 DIAGNOSIS — Z880 Allergy status to penicillin: Secondary | ICD-10-CM

## 2019-09-08 DIAGNOSIS — R0600 Dyspnea, unspecified: Secondary | ICD-10-CM | POA: Diagnosis present

## 2019-09-08 DIAGNOSIS — R0602 Shortness of breath: Secondary | ICD-10-CM

## 2019-09-08 DIAGNOSIS — F419 Anxiety disorder, unspecified: Secondary | ICD-10-CM | POA: Diagnosis present

## 2019-09-08 DIAGNOSIS — F339 Major depressive disorder, recurrent, unspecified: Secondary | ICD-10-CM | POA: Diagnosis present

## 2019-09-08 DIAGNOSIS — K219 Gastro-esophageal reflux disease without esophagitis: Secondary | ICD-10-CM | POA: Diagnosis present

## 2019-09-08 DIAGNOSIS — I5022 Chronic systolic (congestive) heart failure: Secondary | ICD-10-CM | POA: Diagnosis present

## 2019-09-08 DIAGNOSIS — J440 Chronic obstructive pulmonary disease with acute lower respiratory infection: Secondary | ICD-10-CM | POA: Diagnosis present

## 2019-09-08 DIAGNOSIS — G4700 Insomnia, unspecified: Secondary | ICD-10-CM | POA: Diagnosis present

## 2019-09-08 DIAGNOSIS — Z7901 Long term (current) use of anticoagulants: Secondary | ICD-10-CM

## 2019-09-08 DIAGNOSIS — Z79899 Other long term (current) drug therapy: Secondary | ICD-10-CM

## 2019-09-08 DIAGNOSIS — Z888 Allergy status to other drugs, medicaments and biological substances status: Secondary | ICD-10-CM

## 2019-09-08 DIAGNOSIS — Z7984 Long term (current) use of oral hypoglycemic drugs: Secondary | ICD-10-CM

## 2019-09-08 DIAGNOSIS — J189 Pneumonia, unspecified organism: Secondary | ICD-10-CM | POA: Diagnosis not present

## 2019-09-08 DIAGNOSIS — F4322 Adjustment disorder with anxiety: Secondary | ICD-10-CM | POA: Diagnosis present

## 2019-09-08 DIAGNOSIS — E1169 Type 2 diabetes mellitus with other specified complication: Secondary | ICD-10-CM | POA: Diagnosis present

## 2019-09-08 DIAGNOSIS — I4891 Unspecified atrial fibrillation: Secondary | ICD-10-CM | POA: Diagnosis present

## 2019-09-08 DIAGNOSIS — I2510 Atherosclerotic heart disease of native coronary artery without angina pectoris: Secondary | ICD-10-CM | POA: Diagnosis present

## 2019-09-08 DIAGNOSIS — J4550 Severe persistent asthma, uncomplicated: Secondary | ICD-10-CM | POA: Diagnosis present

## 2019-09-08 DIAGNOSIS — R0609 Other forms of dyspnea: Secondary | ICD-10-CM

## 2019-09-08 DIAGNOSIS — H9203 Otalgia, bilateral: Secondary | ICD-10-CM | POA: Diagnosis present

## 2019-09-08 DIAGNOSIS — I272 Pulmonary hypertension, unspecified: Secondary | ICD-10-CM | POA: Diagnosis present

## 2019-09-08 DIAGNOSIS — Z20822 Contact with and (suspected) exposure to covid-19: Secondary | ICD-10-CM | POA: Diagnosis present

## 2019-09-08 DIAGNOSIS — I25118 Atherosclerotic heart disease of native coronary artery with other forms of angina pectoris: Secondary | ICD-10-CM | POA: Diagnosis present

## 2019-09-08 DIAGNOSIS — I110 Hypertensive heart disease with heart failure: Secondary | ICD-10-CM | POA: Diagnosis present

## 2019-09-08 LAB — BASIC METABOLIC PANEL
Anion gap: 12 (ref 5–15)
BUN: 22 mg/dL (ref 8–23)
CO2: 24 mmol/L (ref 22–32)
Calcium: 9 mg/dL (ref 8.9–10.3)
Chloride: 101 mmol/L (ref 98–111)
Creatinine, Ser: 0.77 mg/dL (ref 0.44–1.00)
GFR calc Af Amer: >60 mL/min (ref >60)
GFR calc non Af Amer: >60 mL/min (ref >60)
Glucose, Bld: 149 mg/dL — ABNORMAL HIGH (ref 70–99)
Potassium: 3.5 mmol/L (ref 3.5–5.1)
Sodium: 137 mmol/L (ref 135–145)

## 2019-09-08 LAB — CBC
HCT: 39.4 % (ref 36.0–46.0)
Hemoglobin: 12.5 g/dL (ref 12.0–15.0)
MCH: 26.1 pg (ref 26.0–34.0)
MCHC: 31.7 g/dL (ref 30.0–36.0)
MCV: 82.3 fL (ref 80.0–100.0)
Platelets: 188 10*3/uL (ref 150–400)
RBC: 4.79 MIL/uL (ref 3.87–5.11)
RDW: 15.6 % — ABNORMAL HIGH (ref 11.5–15.5)
WBC: 7.9 10*3/uL (ref 4.0–10.5)
nRBC: 0 % (ref 0.0–0.2)

## 2019-09-08 LAB — HEPATIC FUNCTION PANEL
ALT: 46 U/L — ABNORMAL HIGH (ref 0–44)
AST: 42 U/L — ABNORMAL HIGH (ref 15–41)
Albumin: 3.6 g/dL (ref 3.5–5.0)
Alkaline Phosphatase: 71 U/L (ref 38–126)
Bilirubin, Direct: 0.2 mg/dL (ref 0.0–0.2)
Indirect Bilirubin: 0.5 mg/dL (ref 0.3–0.9)
Total Bilirubin: 0.7 mg/dL (ref 0.3–1.2)
Total Protein: 7.6 g/dL (ref 6.5–8.1)

## 2019-09-08 LAB — GLUCOSE, CAPILLARY: Glucose-Capillary: 359 mg/dL — ABNORMAL HIGH (ref 70–99)

## 2019-09-08 LAB — TROPONIN I (HIGH SENSITIVITY): Troponin I (High Sensitivity): 16 ng/L (ref <18)

## 2019-09-08 LAB — BRAIN NATRIURETIC PEPTIDE: B Natriuretic Peptide: 614.3 pg/mL — ABNORMAL HIGH (ref 0.0–100.0)

## 2019-09-08 LAB — SARS CORONAVIRUS 2 BY RT PCR (HOSPITAL ORDER, PERFORMED IN ~~LOC~~ HOSPITAL LAB): SARS Coronavirus 2: NEGATIVE

## 2019-09-08 MED ORDER — INSULIN ASPART 100 UNIT/ML ~~LOC~~ SOLN
0.0000 [IU] | Freq: Three times a day (TID) | SUBCUTANEOUS | Status: DC
  Administered 2019-09-09 (×2): 2 [IU] via SUBCUTANEOUS
  Administered 2019-09-09 – 2019-09-10 (×3): 3 [IU] via SUBCUTANEOUS

## 2019-09-08 MED ORDER — FUROSEMIDE 10 MG/ML IJ SOLN
20.0000 mg | Freq: Once | INTRAMUSCULAR | Status: AC
  Administered 2019-09-08: 20 mg via INTRAVENOUS
  Filled 2019-09-08: qty 2

## 2019-09-08 MED ORDER — ACETAMINOPHEN 650 MG RE SUPP: 650.0000 mg | Freq: Four times a day (QID) | RECTAL | Status: DC | PRN

## 2019-09-08 MED ORDER — FAMOTIDINE 20 MG PO TABS
20.0000 mg | ORAL_TABLET | Freq: Two times a day (BID) | ORAL | Status: DC
  Administered 2019-09-08 – 2019-09-11 (×6): 20 mg via ORAL
  Filled 2019-09-08 (×6): qty 1

## 2019-09-08 MED ORDER — INSULIN ASPART 100 UNIT/ML ~~LOC~~ SOLN: 0.0000 [IU] | Freq: Three times a day (TID) | SUBCUTANEOUS | Status: DC

## 2019-09-08 MED ORDER — SODIUM CHLORIDE 0.9 % IV SOLN
1.0000 g | INTRAVENOUS | Status: DC
  Administered 2019-09-09 – 2019-09-10 (×2): 1 g via INTRAVENOUS
  Filled 2019-09-08: qty 10
  Filled 2019-09-08: qty 1

## 2019-09-08 MED ORDER — MONTELUKAST SODIUM 10 MG PO TABS
10.0000 mg | ORAL_TABLET | Freq: Every day | ORAL | Status: DC
  Administered 2019-09-08 – 2019-09-09 (×2): 10 mg via ORAL
  Filled 2019-09-08 (×2): qty 1

## 2019-09-08 MED ORDER — PREDNISONE 20 MG PO TABS
40.0000 mg | ORAL_TABLET | Freq: Every day | ORAL | Status: DC
  Administered 2019-09-10 – 2019-09-11 (×2): 40 mg via ORAL
  Filled 2019-09-08 (×4): qty 2

## 2019-09-08 MED ORDER — AMLODIPINE BESYLATE 5 MG PO TABS
10.0000 mg | ORAL_TABLET | Freq: Every day | ORAL | Status: DC
  Administered 2019-09-09 – 2019-09-11 (×3): 10 mg via ORAL
  Filled 2019-09-08 (×3): qty 2

## 2019-09-08 MED ORDER — LIDOCAINE VISCOUS HCL 2 % MT SOLN
15.0000 mL | Freq: Once | OROMUCOSAL | Status: AC
  Administered 2019-09-08: 15 mL via ORAL
  Filled 2019-09-08: qty 15

## 2019-09-08 MED ORDER — PREDNISONE 20 MG PO TABS: 40.0000 mg | ORAL_TABLET | Freq: Every day | ORAL | Status: DC

## 2019-09-08 MED ORDER — ALBUTEROL (5 MG/ML) CONTINUOUS INHALATION SOLN
10.0000 mg/h | INHALATION_SOLUTION | Freq: Once | RESPIRATORY_TRACT | Status: AC
  Administered 2019-09-08: 10 mg/h via RESPIRATORY_TRACT
  Filled 2019-09-08: qty 20

## 2019-09-08 MED ORDER — APIXABAN 5 MG PO TABS
5.0000 mg | ORAL_TABLET | Freq: Two times a day (BID) | ORAL | Status: DC
  Administered 2019-09-08 – 2019-09-11 (×6): 5 mg via ORAL
  Filled 2019-09-08 (×6): qty 1

## 2019-09-08 MED ORDER — MELATONIN 3 MG PO TABS
3.0000 mg | ORAL_TABLET | Freq: Every day | ORAL | Status: DC
  Administered 2019-09-08: 3 mg via ORAL
  Filled 2019-09-08 (×3): qty 1

## 2019-09-08 MED ORDER — DM-GUAIFENESIN ER 30-600 MG PO TB12
1.0000 | ORAL_TABLET | Freq: Two times a day (BID) | ORAL | Status: DC
  Administered 2019-09-08 – 2019-09-11 (×6): 1 via ORAL
  Filled 2019-09-08: qty 2
  Filled 2019-09-08 (×6): qty 1

## 2019-09-08 MED ORDER — ESCITALOPRAM OXALATE 10 MG PO TABS
10.0000 mg | ORAL_TABLET | Freq: Every day | ORAL | Status: DC
  Administered 2019-09-09 – 2019-09-11 (×3): 10 mg via ORAL
  Filled 2019-09-08 (×3): qty 1

## 2019-09-08 MED ORDER — BENZONATATE 100 MG PO CAPS
100.0000 mg | ORAL_CAPSULE | Freq: Three times a day (TID) | ORAL | Status: DC | PRN
  Administered 2019-09-09: 100 mg via ORAL
  Filled 2019-09-08 (×2): qty 1

## 2019-09-08 MED ORDER — ARFORMOTEROL TARTRATE 15 MCG/2ML IN NEBU
15.0000 ug | INHALATION_SOLUTION | Freq: Two times a day (BID) | RESPIRATORY_TRACT | Status: DC
  Administered 2019-09-08 – 2019-09-09 (×2): 15 ug via RESPIRATORY_TRACT
  Filled 2019-09-08 (×5): qty 2

## 2019-09-08 MED ORDER — ACETAMINOPHEN 325 MG PO TABS
650.0000 mg | ORAL_TABLET | Freq: Four times a day (QID) | ORAL | Status: DC | PRN
  Administered 2019-09-10 – 2019-09-11 (×2): 650 mg via ORAL
  Filled 2019-09-08 (×3): qty 2

## 2019-09-08 MED ORDER — ALUM & MAG HYDROXIDE-SIMETH 200-200-20 MG/5ML PO SUSP
30.0000 mL | Freq: Once | ORAL | Status: AC
  Administered 2019-09-08: 30 mL via ORAL
  Filled 2019-09-08: qty 30

## 2019-09-08 MED ORDER — SODIUM CHLORIDE 0.9 % IV SOLN
500.0000 mg | Freq: Once | INTRAVENOUS | Status: AC
  Administered 2019-09-08: 500 mg via INTRAVENOUS
  Filled 2019-09-08: qty 500

## 2019-09-08 MED ORDER — METHYLPREDNISOLONE SODIUM SUCC 125 MG IJ SOLR
125.0000 mg | Freq: Once | INTRAMUSCULAR | Status: AC
  Administered 2019-09-08: 125 mg via INTRAVENOUS
  Filled 2019-09-08: qty 2

## 2019-09-08 MED ORDER — FUROSEMIDE 20 MG PO TABS
10.0000 mg | ORAL_TABLET | Freq: Two times a day (BID) | ORAL | Status: DC
  Administered 2019-09-09 – 2019-09-11 (×4): 10 mg via ORAL
  Filled 2019-09-08 (×6): qty 1

## 2019-09-08 MED ORDER — METFORMIN HCL 500 MG PO TABS
500.0000 mg | ORAL_TABLET | Freq: Every day | ORAL | Status: DC
  Administered 2019-09-09: 500 mg via ORAL
  Filled 2019-09-08 (×3): qty 1

## 2019-09-08 MED ORDER — BUDESONIDE 0.25 MG/2ML IN SUSP
0.2500 mg | Freq: Two times a day (BID) | RESPIRATORY_TRACT | Status: DC
  Administered 2019-09-08 – 2019-09-09 (×2): 0.25 mg via RESPIRATORY_TRACT
  Filled 2019-09-08 (×4): qty 2

## 2019-09-08 MED ORDER — ONDANSETRON HCL 4 MG/2ML IJ SOLN
4.0000 mg | Freq: Once | INTRAMUSCULAR | Status: AC
  Administered 2019-09-08: 4 mg via INTRAVENOUS
  Filled 2019-09-08: qty 2

## 2019-09-08 MED ORDER — SILDENAFIL CITRATE 20 MG PO TABS
20.0000 mg | ORAL_TABLET | Freq: Three times a day (TID) | ORAL | Status: DC
  Administered 2019-09-08 – 2019-09-09 (×2): 20 mg via ORAL
  Filled 2019-09-08 (×4): qty 1

## 2019-09-08 MED ORDER — INSULIN ASPART 100 UNIT/ML ~~LOC~~ SOLN
0.0000 [IU] | Freq: Every day | SUBCUTANEOUS | Status: DC
  Administered 2019-09-08: 5 [IU] via SUBCUTANEOUS

## 2019-09-08 MED ORDER — SODIUM CHLORIDE 0.9 % IV SOLN
1.0000 g | Freq: Once | INTRAVENOUS | Status: AC
  Administered 2019-09-08: 1 g via INTRAVENOUS
  Filled 2019-09-08: qty 10

## 2019-09-08 NOTE — Hospital Course (Addendum)
  Sharon Spears is a 74 y.o. female presenting with acute onset SOB with cough. PMH is significant for COPD, A. fib, PAH, HF, T2 DM, HLD, HTN and GERD.   CAP with superimposed COPD Exacerbation  Patient satting well on baseline 2 L [place].  CXR at presentation showed new airspace opacity within the right mid lower lung lobe suggestive of CAP.  Troponins x2 at admission were negative for ACS causing chest pain.  Patient continued to report that coughing caused sternal pain. Started patient on azithromycin, ceftriaxone and prednisone.  Patient continued to stabilize.  Continue patient home medications of dextromethorphan/guaifenesin for cough suppression.   Also continued patient albuterol inhaler budesonide and Brovana from home.  Discontinued patient Singulair inpatient as patient had a recent ED presentation with suicidality.  Patient desatted to 85% on room air with ambulation. 2 L of oxygen patient ambulated at 91% O2 sat.  As patient stabilized transitioned ceftriaxone to cefdinir. OT saw patient and did not recommend follow-up.    Hx A. Fib Continued patient home medication  HTN Continue patient amlodipine and held bisoprolol at admission due to bradycardia.  Restarted bisoprolol as patient continued to be bradycardiac and suggestive that this is patient's baseline and had been when she was started on bisoprolol recently.  Despite patient reporting that she did not take medication at home also restarted patient on losartan as she remained hypertensive with bisoprolol and amlodipine.  Per EMR patient should still be taking losartan at home.  T2 DM Patient CBG checked before meals and at bedtime. Patient placed on sensitive sliding scale.  Patient was on inhaled steroids at baseline and continued this medication regimen inpatient as well as oral steroids due to COPD exacerbation.  Depression and anxiety Restarted patient Lexapro she reported that she was out of it at home.  Patient continued to  express anxiety during hospitalization and BuSpar was added to address this.  Patient also reported insomnia and did not want melatonin.  Patient was provided ramelteon.  Made aware by son next day after receiving ramelteon the patient has had a history of substance use disorder with sleeping pills.  Discontinued ramelteon with this new information.  HFrEF Continued patient home medication.  [place] CXR presentation noted small chronic left pleural effusion.  Continued home medication.  Patient did have episode of requesting nitroglycerin overnight however she cannot have nitroglycerin as she is on full definitional.  Reiterated to patient that she should not be taking nitroglycerin at home for intermediate chest pain.  Chest pain is likely 2/2 reflux.  GERD Continued home Pepcid.  Patient had episode during hospitalization of significant chest pain.  EKG was not significant for ACS.  Decreased diet to soft patient had no further episodes.

## 2019-09-08 NOTE — ED Provider Notes (Signed)
[facility] EMERGENCY DEPARTMENT Provider Note   CSN: [id number] Arrival date & time: 09/08/19  4401     History Chief Complaint  Patient presents with  . Chest Pain  . Shortness of Breath    Sharon Spears is a 74 y.o. female.  HPI   74 year old female with a history of asthma, A. fib (on Eliquis), hypertension, COPD (on 2L chronically), CAD, diabetic neuropathic arthritis, hypoxia, cirrhosis, diabetes, pulmonary hypertension, who presents to the emergency department today for evaluation of shortness of breath.  Patient states that for the last 2 days she has had a cough and shortness of breath.  She reports decreased sputum production that is yellow.  She also complains of central chest pain and back pain that is present only when she coughs.  She denies any hemoptysis.  She also reports some rhinorrhea, congestion and a sore throat.  She states she has had some increased bilateral lower extremity swelling and states she is missed her fluid pills recently.  She has not had any fevers at home.  States that she was recently on prednisone and just finished it yesterday.  Past Medical History:  Diagnosis Date  . Adjustment disorder with anxiety   . Asthma   . Atrial fibrillation ([place])   . Benign essential hypertension   . COPD (chronic obstructive pulmonary disease) ([place])   . Coronary artery disease   . Diabetic neuropathic arthritis ([place])   . Heart palpitations   . Hypoxia 07/19/2018  . Other cirrhosis of liver ([place]) 07/20/2018  . Uncontrolled diabetes mellitus type 2 without complications     Patient Active Problem List   Diagnosis Date Noted  . Severe persistent asthma 02/14/2019  . Medication monitoring encounter 02/14/2019  . Episode of recurrent major depressive disorder ([place]) 01/24/2019  . Coronary artery disease of native artery of native heart with stable angina pectoris ([place]) 01/24/2019  . Thyroid mass 12/01/2018  . Renal mass 12/01/2018  . Complex  care coordination 12/01/2018  . Other cirrhosis of liver ([place]) 07/20/2018  . Unspecified atrial fibrillation ([place]) 07/20/2018  . Cough 02/08/2018  . Dyspnea on exertion 02/08/2018  . Chronic systolic heart failure ([place]) 09/25/2017  . Thrombocytopenia ([place]) 03/20/2015  . Type 2 diabetes mellitus with other specified complication ([place]) 02/72/5366  . HTN (hypertension) 03/20/2015  . S/P CABG (coronary artery bypass graft) 03/20/2015    Past Surgical History:  Procedure Laterality Date  . CARDIAC SURGERY    . CESAREAN SECTION    . CORONARY ARTERY BYPASS GRAFT  07/06/2017   Redo-sternotomy and CABG 2 with LIMA to LAD, SVG to OM, and a a closure, mitral valve repair with #30 mm [place] ring on 07/06/2017  . ESOPHAGEAL MANOMETRY N/A 03/06/2012   Procedure: ESOPHAGEAL MANOMETRY (EM);  Surgeon: Beryle Beams, MD;  Location: [facility];  Service: Endoscopy;  Laterality: N/A;  . IR ANGIOGRAM SELECTIVE EACH ADDITIONAL VESSEL  02/16/2019  . IR ANGIOGRAM SELECTIVE EACH ADDITIONAL VESSEL  02/16/2019  . IR ANGIOGRAM SELECTIVE EACH ADDITIONAL VESSEL  02/16/2019  . IR ANGIOGRAM SELECTIVE EACH ADDITIONAL VESSEL  02/16/2019  . IR RENAL SELECTIVE  UNI INC S&I MOD SED  02/16/2019  . IR US GUIDE VASC ACCESS RIGHT  02/16/2019  . RIGHT/LEFT HEART CATH AND CORONARY/GRAFT ANGIOGRAPHY N/A 08/07/2019   Procedure: RIGHT/LEFT HEART CATH AND CORONARY/GRAFT ANGIOGRAPHY;  Surgeon: Adrian Prows, MD;  Location: [facility];  Service: Cardiovascular;  Laterality: N/A;  . TONSILLECTOMY       OB  History   No obstetric history on file.     Family History  Problem Relation Age of Onset  . Asthma Mother   . Alcohol abuse Father     Social History   Tobacco Use  . Smoking status: Never Smoker  . Smokeless tobacco: Never Used  Vaping Use  . Vaping Use: Never used  Substance Use Topics  . Alcohol use: No  . Drug use: Never    Home Medications Prior to Admission medications   Medication Sig Start Date End  Date Taking? Authorizing Provider  acetaminophen (TYLENOL) 325 MG tablet Take 2 tablets (650 mg total) by mouth every 6 (six) hours as needed. 08/28/19   Darr, Marguerita Beards, PA-C  albuterol (VENTOLIN HFA) 108 (90 Base) MCG/ACT inhaler INHALE 2 PUFFS INTO THE LUNGS EVERY 6 HOURS AS NEEDED FOR WHEEZE Patient taking differently: Inhale 2 puffs into the lungs every 6 (six) hours as needed for wheezing or shortness of breath.  04/09/19   Lauraine Rinne, NP  amLODipine (NORVASC) 10 MG tablet Take 10 mg by mouth daily. 08/31/19   [provider]  apixaban (ELIQUIS) 5 MG TABS tablet Take 1 tablet (5 mg total) by mouth 2 (two) times daily. 08/23/19   Adrian Prows, MD  arformoterol (BROVANA) 15 MCG/2ML NEBU Take 2 mLs (15 mcg total) by nebulization 2 (two) times daily. 07/02/19   Elsie Stain, MD  atorvastatin (LIPITOR) 40 MG tablet Take 1 tablet (40 mg total) by mouth daily. 07/03/19   Adrian Prows, MD  azithromycin (ZITHROMAX Z-PAK) 250 MG tablet Take two pills today followed by one a day until gone 09/03/19   Raylene Everts, MD  bisoprolol (ZEBETA) 10 MG tablet Take 0.5 tablets (5 mg total) by mouth daily. 08/17/19   Silvestre Gunner, MD  Blood Glucose Monitoring Suppl (ACCU-CHEK AVIVA PLUS) w/Device KIT USE TO CHECK BLOOD SUGARS ONCE DAILY 10/10/18   [provider]  budesonide (PULMICORT) 0.25 MG/2ML nebulizer solution Take 2 mLs (0.25 mg total) by nebulization 2 (two) times daily. 07/02/19   Elsie Stain, MD  dextromethorphan-guaiFENesin [facility] DM) 30-600 MG 12hr tablet Take 1 tablet by mouth 2 (two) times daily. 09/03/19   Raylene Everts, MD  escitalopram (LEXAPRO) 10 MG tablet Take 10 mg by mouth daily. 08/31/19   [provider]  famotidine (PEPCID) 20 MG tablet Take 20 mg by mouth 2 (two) times daily.    [provider]  furosemide (LASIX) 20 MG tablet Take 1 tablet (20 mg total) by mouth 2 (two) times daily. Patient taking differently: Take 10 mg by mouth 2 (two)  times daily.  07/21/18   Samuella Cota, MD  glucose blood (ACCU-CHEK AVIVA PLUS) test strip USE WITH GLUCOMETER TO CHECK BLOOD SUGARS ONCE DAILY 10/10/18   [provider]  losartan (COZAAR) 100 MG tablet Take 1 tablet (100 mg total) by mouth daily. Patient not taking: Reported on 09/04/2019 06/05/19 09/03/19  Gildardo Pounds, NP  macitentan (OPSUMIT) 10 MG tablet Take 1 tablet (10 mg total) by mouth daily. 08/23/19   Adrian Prows, MD  metFORMIN (GLUCOPHAGE) 500 MG tablet Take 500 mg by mouth daily.    [provider]  montelukast (SINGULAIR) 10 MG tablet Take 10 mg by mouth at bedtime.  07/04/19   [provider]  sildenafil (REVATIO) 20 MG tablet Take 1 tablet (20 mg total) by mouth 3 (three) times daily. 08/23/19   Adrian Prows, MD    Allergies  Contrast media [iodinated diagnostic agents], Adhesive [tape], and Penicillins  Review of Systems   Review of Systems  Constitutional: Negative for chills and fever.  HENT: Positive for congestion, rhinorrhea and sore throat. Negative for ear pain.   Eyes: Negative for visual disturbance.  Respiratory: Positive for cough and shortness of breath.   Cardiovascular: Positive for chest pain and leg swelling.  Gastrointestinal: Negative for abdominal pain, constipation, diarrhea, nausea and vomiting.  Genitourinary: Negative for dysuria and hematuria.  Musculoskeletal: Positive for back pain.  Skin: Negative for color change and rash.  Neurological: Negative for headaches.  All other systems reviewed and are negative.   Physical Exam Updated Vital Signs BP (!) 147/58   Pulse 87   Temp 98.8 F (37.1 C) (Oral)   Resp (!) 28   Ht _0  (1.6 m)   Wt 68.5 kg   SpO2 92%   BMI 26.75 kg/m   Physical Exam Vitals and nursing note reviewed.  Constitutional:      General: She is not in acute distress.    Appearance: She is well-developed.  HENT:     Head: Normocephalic and atraumatic.  Eyes:     Conjunctiva/sclera:  Conjunctivae normal.  Cardiovascular:     Rate and Rhythm: Normal rate and regular rhythm.     Heart sounds: No murmur heard.   Pulmonary:     Effort: Tachypnea present. No respiratory distress.     Breath sounds: Examination of the right-upper field reveals wheezing. Examination of the right-middle field reveals wheezing. Examination of the left-middle field reveals wheezing. Examination of the right-lower field reveals wheezing and rales. Examination of the left-lower field reveals wheezing and rales. Wheezing and rales present.     Comments: Conversational dyspnea Abdominal:     Palpations: Abdomen is soft.     Tenderness: There is no abdominal tenderness.  Musculoskeletal:     Cervical back: Neck supple.     Comments: 1+ pitting edema to the BLE  Skin:    General: Skin is warm and dry.  Neurological:     Mental Status: She is alert.     ED Results / Procedures / Treatments   Labs (all labs ordered are listed, but only abnormal results are displayed) Labs Reviewed  BASIC METABOLIC PANEL - Abnormal; Notable for the following components:      Result Value   Glucose, Bld 149 (*)    All other components within normal limits  CBC - Abnormal; Notable for the following components:   RDW 15.6 (*)    All other components within normal limits  BRAIN NATRIURETIC PEPTIDE - Abnormal; Notable for the following components:   B Natriuretic Peptide 614.3 (*)    All other components within normal limits  HEPATIC FUNCTION PANEL - Abnormal; Notable for the following components:   AST 42 (*)    ALT 46 (*)    All other components within normal limits  SARS CORONAVIRUS 2 BY RT PCR (HOSPITAL ORDER, [facility])  TROPONIN I (HIGH SENSITIVITY)    EKG None  Radiology DG Chest Portable 1 View  Result Date: 09/08/2019 CLINICAL DATA:  Chest pain and SOB. EXAM: PORTABLE CHEST 1 VIEW COMPARISON:  09/03/2019. FINDINGS: Cardiac enlargement is stable. Previous median  sternotomy and CABG. There is been interval development of airspace opacity within the right mid to lower lung concerning for pneumonia. Small left pleural effusion, unchanged. Linear areas of scarring are noted in the left midlung and left lower lobe.  IMPRESSION: 1. New airspace opacity within the right mid to lower lung concerning for pneumonia. 2. Stable small left pleural effusion. Electronically Signed   By: Kerby Moors M.D.   On: 09/08/2019 10:40    Procedures Procedures (including critical care time)  CRITICAL CARE Performed by: Rodney Booze   Total critical care time: 32 minutes  Critical care time was exclusive of separately billable procedures and treating other patients.  Critical care was necessary to treat or prevent imminent or life-threatening deterioration.  Critical care was time spent personally by me on the following activities: development of treatment plan with patient and/or surrogate as well as nursing, discussions with consultants, evaluation of patient's response to treatment, examination of patient, obtaining history from patient or surrogate, ordering and performing treatments and interventions, ordering and review of laboratory studies, ordering and review of radiographic studies, pulse oximetry and re-evaluation of patient's condition.   Medications Ordered in ED Medications  azithromycin (ZITHROMAX) 500 mg in sodium chloride 0.9 % 250 mL IVPB (500 mg Intravenous New Bag/Given 09/08/19 1450)  methylPREDNISolone sodium succinate (SOLU-MEDROL) 125 mg/2 mL injection 125 mg (125 mg Intravenous Given 09/08/19 1235)  albuterol (PROVENTIL,VENTOLIN) solution continuous neb (10 mg/hr Nebulization Given 09/08/19 1345)  cefTRIAXone (ROCEPHIN) 1 g in sodium chloride 0.9 % 100 mL IVPB (0 g Intravenous Stopped 09/08/19 1311)  ondansetron (ZOFRAN) injection 4 mg (4 mg Intravenous Given 09/08/19 1237)  alum & mag hydroxide-simeth (MAALOX/MYLANTA) 200-200-20 MG/5ML suspension  30 mL (30 mLs Oral Given 09/08/19 1238)    And  lidocaine (XYLOCAINE) 2 % viscous mouth solution 15 mL (15 mLs Oral Given 09/08/19 1240)  furosemide (LASIX) injection 20 mg (20 mg Intravenous Given 09/08/19 1444)    ED Course  I have reviewed the triage vital signs and the nursing notes.  Pertinent labs & imaging results that were available during my care of the patient were reviewed by me and considered in my medical decision making (see chart for details).    MDM Rules/Calculators/A&P                          74 year old female with history of COPD on chronic 2 L O2, pulmonary hypertension, presenting for evaluation of increased shortness of breath, cough for the last several days.  Reviewed/interpreted labs CBC is unremarkable BMP unremarkable Liver enzymes are baseline Troponin negative, delta negative BNP elevated  COVID is negative  EKG Atrial fibrillation, Rightward axis, Nonspecific ST abnormality, Abnormal ECG  CXR reviewed/interpreted -  1. New airspace opacity within the right mid to lower lung concerning for pneumonia. 2. Stable small left pleural effusion.  Pt with sob and tachypnea on home o2. Has new pneumonia on cxr, likely copd exacerbation and likely mild chf exacerbation as well.   She was given solumedrol, albuterol neb, lasix, and abx in the ED. On reassessment she feels somewhat improved however given her clinical picture today I feel she would benefit from admission.   3:17 PM CONSULT with Dr. Alba Cory who accepts patient for admission    Final Clinical Impression(s) / ED Diagnoses Final diagnoses:  Community acquired pneumonia, unspecified laterality    Rx / DC Orders ED Discharge Orders    None       Rodney Booze, PA-C 09/08/19 1517    Pattricia Boss, MD 09/10/19 1421

## 2019-09-08 NOTE — H&P (Addendum)
[facility] Admission History and Physical Service Pager: [phone]  Patient name: Sharon Spears Medical record number: [id number] Date of birth: 03-04-1945 Age: 74 y.o. Gender: female  Primary Care Provider: Gildardo Pounds, NP Consultants: None Code Status: FULL Preferred Emergency Contact:  Sharon Spears [phone] vs [phone]? Contact Information    Name Relation Home Work Mobile   Sharon Spears [phone]     Sharon Spears [phone]       Chief Complaint: SOB  Assessment and Plan: Sharon Spears is a 74 y.o. female presenting with acute onset SOB with cough. PMH is significant for COPD, A. fib, PAH, HF, T2 DM, HLD, HTN and GERD.  CAP with superimposed COPD exacerbation Patient home medications include albuterol inhaler, budesonide inhaler, Singulair and Brovana 2 mL as well as baseline of 2L [name]. Patient reports new onset of SOB, remains at baseline supplemental oxygen requirement of 2L upon admission. We will continue patient's home medication regimen however may consider discontinuing Singulair as it has significant correlation with suicidal ideation and patient had recent ED visit for suicidality.  CXR at presentation noted new airspace opacity within the right mid to lower lung lobe suggestive of CAP.  Notable labs include WBC 7.9, Hgb 12.5, PLT 188, and overall, CBC is not concerning for significant infection.  Patient reports chest pain with cough, negative TRP 11>16.  Patient is COVID negative and vaccinated.  Per EMR, patient was seen in the ED 09/03/2019 and discharged from ED with azithromycin and guaifenesin/dextromethorphan for URI.  Patient has history of recurrent URIs.  Prior to presentation 8/23 patient had gone to PCP for same URI and had received prednisone.  Patient returned 8/24 with suicidality and generalized body aches.  Patient was not admitted into the medical hospital nor [place] and patient was discharged from ED  8/25.  Has a documented phone call on 8/26 reporting SOB with ambulation and concerned that her time in the ED deconditioned her.  RN on phone call recommended that if patient's O2 sat drop below 89%, she should call 911. RN made appointment for patient to receive oxygen 8/30 at 430 with TP.  09/07/2019 patient called her PCPs reported that she had finished taking the azithromycin and prednisone and did not feel better, O2 sat 88 patient was also reporting sore throat at this time.  Patient was advised to go to urgent care the patient refused per report.  -Admit to FPTS, MedSurg, Dr. Ardelia Mems -Continue Ceftriazone and prednisone -Continue supplemental oxygen as needed to maintain oxygen sats>88% -Continuous pulse ox -Tessalon Perles -Albuterol inhaler, budesonide, Singulair, Brovana - vitals per floor protocol   Hx A. fib Patient home medication include Eliquis 5 mg twice daily.  EKG noted atrial fibrillation. -Continue home medication regimen  HTN Patient home medication regimen includes amlodipine 10 mg, bisoprolol 5 mg.  NA 137, potassium 3.5.  HR 76 ranging 42-87. BP 146/82 most recently.  -Continue home medications -Hold bisoprolol as patient has been bradycardic multiple times in ED  T2 DM Patient home medication include metformin 500 mg twice daily.  CBG at presentation 149.  BUN 22 creatinine 0.77.  GFR greater than 60.  AST 42, ALT 46, ALP 71 T protein 7.6 albumin 3.6. - Sensitive sliding scale - CBG AC every qh  Depression and anxiety Patient has a recent history 08/2019 of being in the ED with suicidal ideation.  Home medication regimen includes Lexapro 10. Patient reports running out of Lexapro -Continue home medication  HFrEF Patient home  medication includes furosemide 40 mg. BNP at presentation 614.3.  Last echo 07/10/2019 noted EF 55%. - continue home lasix   [place] Patient has a history of pH and takes sildenafil 10 PRN. CXR at presentation noted chronic small left pleural  effusion. -Continue home medication  GERD Patient home medication regimen includes Pepcid 20 mg twice daily. -Continue home medication regimen  FEN/GI:  carb modified Prophylaxis: Eliquis  Disposition: MedSurg  History of Present Illness:  Sharon Spears is a 74 y.o. female presenting with chest pain and SOB.   Patient reports that she always has chest pain that is secondary to her chronic cough related to COPD. She also reports feeling SOB. She states she has ben coughing up a lot of yellow phelgm. Her sore throat and bilateral ear pain began yesterday morning.  Patient states that she feels anxious most of the time.  Every time she drinks something she sates that she "vomits" a little. She reports having diarrhea with "beige- colored" stools she believes is likely due to having only had applesauce. She reports normal urine output. Patient reports that she lives alone and she has a Spears who lives nearby but is often busy traveling. Patient originally from [place] and then moved to [place] before coming to [place]. 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   Reports that dr. Einar Gip told her to stop taking lipitor bc cholesterol was in good range  reorts no longer taking Lexapro due to having no no refills.  Reports that she never smoked tobacco Only drinks on holidays  Denies THC use, denies cocaine or meth  Review Of Systems: Per HPI with the following additions:   Review of Systems  Constitutional: Negative for fever.  Respiratory: Positive for shortness of breath.   Cardiovascular: Positive for chest pain.  Gastrointestinal: Positive for diarrhea, nausea and vomiting.     Patient Active Problem List   Diagnosis Date Noted  . Severe persistent asthma 02/14/2019  . Medication monitoring encounter 02/14/2019  . Episode of recurrent major depressive disorder ([place]) 01/24/2019  . Coronary artery disease of native artery of native  heart with stable angina pectoris ([place]) 01/24/2019  . Thyroid mass 12/01/2018  . Renal mass 12/01/2018  . Complex care coordination 12/01/2018  . Other cirrhosis of liver ([place]) 07/20/2018  . Unspecified atrial fibrillation ([place]) 07/20/2018  . Cough 02/08/2018  . Dyspnea on exertion 02/08/2018  . Chronic systolic heart failure ([place]) 09/25/2017  . Thrombocytopenia ([place]) 03/20/2015  . Type 2 diabetes mellitus with other specified complication ([place]) 50/27/7412  . HTN (hypertension) 03/20/2015  . S/P CABG (coronary artery bypass graft) 03/20/2015    Past Medical History: Past Medical History:  Diagnosis Date  . Adjustment disorder with anxiety   . Asthma   . Atrial fibrillation ([place])   . Benign essential hypertension   . COPD (chronic obstructive pulmonary disease) ([place])   . Coronary artery disease   . Diabetic neuropathic arthritis ([place])   . Heart palpitations   . Hypoxia 07/19/2018  . Other cirrhosis of liver ([place]) 07/20/2018  . Uncontrolled diabetes mellitus type 2 without complications     Past Surgical History: Past Surgical History:  Procedure Laterality Date  . CARDIAC SURGERY    . CESAREAN SECTION    . CORONARY ARTERY BYPASS GRAFT  07/06/2017   Redo-sternotomy and CABG 2 with LIMA to LAD, SVG to OM, and a a closure, mitral valve repair with #30  mm [place] ring on 07/06/2017  . ESOPHAGEAL MANOMETRY N/A 03/06/2012   Procedure: ESOPHAGEAL MANOMETRY (EM);  Surgeon: Beryle Beams, MD;  Location: [facility];  Service: Endoscopy;  Laterality: N/A;  . IR ANGIOGRAM SELECTIVE EACH ADDITIONAL VESSEL  02/16/2019  . IR ANGIOGRAM SELECTIVE EACH ADDITIONAL VESSEL  02/16/2019  . IR ANGIOGRAM SELECTIVE EACH ADDITIONAL VESSEL  02/16/2019  . IR ANGIOGRAM SELECTIVE EACH ADDITIONAL VESSEL  02/16/2019  . IR RENAL SELECTIVE  UNI INC S&I MOD SED  02/16/2019  . IR US GUIDE VASC ACCESS RIGHT  02/16/2019  . RIGHT/LEFT HEART CATH AND CORONARY/GRAFT ANGIOGRAPHY N/A 08/07/2019   Procedure: RIGHT/LEFT HEART  CATH AND CORONARY/GRAFT ANGIOGRAPHY;  Surgeon: Adrian Prows, MD;  Location: [facility];  Service: Cardiovascular;  Laterality: N/A;  . TONSILLECTOMY      Social History: Social History   Tobacco Use  . Smoking status: Never Smoker  . Smokeless tobacco: Never Used  Vaping Use  . Vaping Use: Never used  Substance Use Topics  . Alcohol use: No  . 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  Family History: Family History  Problem Relation Age of Onset  . Asthma Mother   . Alcohol abuse Father     Allergies and Medications: Allergies  Allergen Reactions  . Contrast Media [Iodinated Diagnostic Agents] Anaphylaxis    Per pt she had anaphylaxis reaction to contrast media in the past. States she couldn't breathe and they had to give her epi.   . Adhesive [Tape] Itching  . Penicillins Other (See Comments)    GI upset Has patient had a PCN reaction causing immediate rash, facial/tongue/throat swelling, SOB or lightheadedness with hypotension: No Has patient had a PCN reaction causing severe rash involving mucus membranes or skin necrosis: No Has patient had a PCN reaction that required hospitalization: No Has patient had a PCN reaction occurring within the last 10 years: No If all of the above answers are "NO", then may proceed with Cephalosporin use.   No current facility-administered medications on file prior to encounter.   Current Outpatient Medications on File Prior to Encounter  Medication Sig Dispense Refill  . acetaminophen (TYLENOL) 325 MG tablet Take 2 tablets (650 mg total) by mouth every 6 (six) hours as needed. 30 tablet 0  . albuterol (VENTOLIN HFA) 108 (90 Base) MCG/ACT inhaler INHALE 2 PUFFS INTO THE LUNGS EVERY 6 HOURS AS NEEDED FOR WHEEZE (Patient taking differently: Inhale 2 puffs into the lungs every 6 (six) hours as needed for  wheezing or shortness of breath. ) 18 g 3  . amLODipine (NORVASC) 10 MG tablet Take 10 mg by mouth daily.    Marland Kitchen apixaban (ELIQUIS) 5 MG TABS tablet Take 1 tablet (5 mg total) by mouth 2 (two) times daily. 60 tablet 3  . arformoterol (BROVANA) 15 MCG/2ML NEBU Take 2 mLs (15 mcg total) by nebulization 2 (two) times daily. 120 mL 6  . atorvastatin (LIPITOR) 40 MG tablet Take 1 tablet (40 mg total) by mouth daily. 90 tablet 3  . azithromycin (ZITHROMAX Z-PAK) 250 MG tablet Take two pills today followed by one a day until gone 6 tablet 0  . bisoprolol (ZEBETA) 10 MG tablet Take 0.5 tablets (5 mg total) by mouth daily. 30 tablet 3  . Blood Glucose Monitoring Suppl (ACCU-CHEK AVIVA PLUS) w/Device KIT USE TO CHECK BLOOD SUGARS  ONCE DAILY    . budesonide (PULMICORT) 0.25 MG/2ML nebulizer solution Take 2 mLs (0.25 mg total) by nebulization 2 (two) times daily. 120 mL 12  . dextromethorphan-guaiFENesin (MUCINEX DM) 30-600 MG 12hr tablet Take 1 tablet by mouth 2 (two) times daily. 20 tablet 0  . escitalopram (LEXAPRO) 10 MG tablet Take 10 mg by mouth daily.    . famotidine (PEPCID) 20 MG tablet Take 20 mg by mouth 2 (two) times daily.    . furosemide (LASIX) 20 MG tablet Take 1 tablet (20 mg total) by mouth 2 (two) times daily. (Patient taking differently: Take 10 mg by mouth 2 (two) times daily. )    . glucose blood (ACCU-CHEK AVIVA PLUS) test strip USE WITH GLUCOMETER TO CHECK BLOOD SUGARS ONCE DAILY    . losartan (COZAAR) 100 MG tablet Take 1 tablet (100 mg total) by mouth daily. (Patient not taking: Reported on 09/04/2019) 90 tablet 1  . macitentan (OPSUMIT) 10 MG tablet Take 1 tablet (10 mg total) by mouth daily. 30 tablet 6  . metFORMIN (GLUCOPHAGE) 500 MG tablet Take 500 mg by mouth daily.    . montelukast (SINGULAIR) 10 MG tablet Take 10 mg by mouth at bedtime.     . sildenafil (REVATIO) 20 MG tablet Take 1 tablet (20 mg total) by mouth 3 (three) times daily. 90 tablet 6    Objective: BP (!) 147/58    Pulse 87   Temp 98.8 F (37.1 C) (Oral)   Resp (!) 28   Ht _0  (1.6 m)   Wt 68.5 kg   SpO2 92%   BMI 26.75 kg/m  Exam: General: Patient appears stated age, NAD, breathing comfortably on 2L [place], appropriate mood and affect overall pleasant and cheerful Eyes: EOM intact ENTM: MMM, slightly erythematous throat Neck: No cervical lymphadenopathy noted Cardiovascular: Irregular rate and rhythm, no murmur detected Respiratory: Popping felt and heard in the LUL, rhonchi noted bilateral basilar lobes and RUL, inspiratory wheezing noted in RLL, inspiratory wheeze in upper airway when listening from apical lobes Gastrointestinal: Normoactive bowel sounds, no pain to palpation MSK: 4/5 BLE, 5/5 bilateral grip strength, 5/5 bilateral upper extremity, 4/5 bilateral dorsi and plantar flexion Neuro: CN III-VI intact no focal neuro deficits with the exception of CN VIII sound is louder on the left side Psych: denies SI, HI, "little bit ok/little bit sick/little bit sad"  Labs and Imaging: CBC BMET  Recent Labs  Lab 09/08/19 0534  WBC 7.9  HGB 12.5  HCT 39.4  PLT 188   Recent Labs  Lab 09/08/19 0534  NA 137  K 3.5  CL 101  CO2 24  BUN 22  CREATININE 0.77  GLUCOSE 149*  CALCIUM 9.0     EKG: A. fib  DG Chest Portable 1 View  Result Date: 09/08/2019 CLINICAL DATA:  Chest pain and SOB. EXAM: PORTABLE CHEST 1 VIEW COMPARISON:  09/03/2019. FINDINGS: Cardiac enlargement is stable. Previous median sternotomy and CABG. There is been interval development of airspace opacity within the right mid to lower lung concerning for pneumonia. Small left pleural effusion, unchanged. Linear areas of scarring are noted in the left midlung and left lower lobe. IMPRESSION: 1. New airspace opacity within the right mid to lower lung concerning for pneumonia. 2. Stable small left pleural effusion. Electronically Signed   By: Kerby Moors M.D.   On: 09/08/2019 10:40    Eulis Foster,  MD 09/08/2019, 3:11 PM PGY-1, [facility] Intern pager: [phone], text pages  welcome

## 2019-09-08 NOTE — ED Triage Notes (Signed)
Pt reports Chest pain and SOB.  Initial O2 level was 90% on RA, placed on 2L [place], now at 99%  Pt is coughing and has a large amount of mucus.

## 2019-09-09 DIAGNOSIS — J9621 Acute and chronic respiratory failure with hypoxia: Secondary | ICD-10-CM | POA: Diagnosis present

## 2019-09-09 DIAGNOSIS — F419 Anxiety disorder, unspecified: Secondary | ICD-10-CM | POA: Diagnosis present

## 2019-09-09 DIAGNOSIS — I4891 Unspecified atrial fibrillation: Secondary | ICD-10-CM | POA: Diagnosis present

## 2019-09-09 DIAGNOSIS — I272 Pulmonary hypertension, unspecified: Secondary | ICD-10-CM | POA: Diagnosis present

## 2019-09-09 DIAGNOSIS — R0600 Dyspnea, unspecified: Secondary | ICD-10-CM | POA: Diagnosis present

## 2019-09-09 DIAGNOSIS — J189 Pneumonia, unspecified organism: Secondary | ICD-10-CM | POA: Diagnosis present

## 2019-09-09 DIAGNOSIS — Z7901 Long term (current) use of anticoagulants: Secondary | ICD-10-CM | POA: Diagnosis not present

## 2019-09-09 DIAGNOSIS — Z7984 Long term (current) use of oral hypoglycemic drugs: Secondary | ICD-10-CM | POA: Diagnosis not present

## 2019-09-09 DIAGNOSIS — I110 Hypertensive heart disease with heart failure: Secondary | ICD-10-CM | POA: Diagnosis present

## 2019-09-09 DIAGNOSIS — E785 Hyperlipidemia, unspecified: Secondary | ICD-10-CM | POA: Diagnosis present

## 2019-09-09 DIAGNOSIS — Z20822 Contact with and (suspected) exposure to covid-19: Secondary | ICD-10-CM | POA: Diagnosis present

## 2019-09-09 DIAGNOSIS — H9203 Otalgia, bilateral: Secondary | ICD-10-CM | POA: Diagnosis present

## 2019-09-09 DIAGNOSIS — J441 Chronic obstructive pulmonary disease with (acute) exacerbation: Secondary | ICD-10-CM | POA: Diagnosis present

## 2019-09-09 DIAGNOSIS — I5022 Chronic systolic (congestive) heart failure: Secondary | ICD-10-CM | POA: Diagnosis present

## 2019-09-09 DIAGNOSIS — E1169 Type 2 diabetes mellitus with other specified complication: Secondary | ICD-10-CM | POA: Diagnosis present

## 2019-09-09 DIAGNOSIS — K219 Gastro-esophageal reflux disease without esophagitis: Secondary | ICD-10-CM | POA: Diagnosis present

## 2019-09-09 DIAGNOSIS — J4550 Severe persistent asthma, uncomplicated: Secondary | ICD-10-CM | POA: Diagnosis present

## 2019-09-09 DIAGNOSIS — G4700 Insomnia, unspecified: Secondary | ICD-10-CM | POA: Diagnosis present

## 2019-09-09 DIAGNOSIS — F4322 Adjustment disorder with anxiety: Secondary | ICD-10-CM | POA: Diagnosis present

## 2019-09-09 DIAGNOSIS — J440 Chronic obstructive pulmonary disease with acute lower respiratory infection: Secondary | ICD-10-CM | POA: Diagnosis present

## 2019-09-09 DIAGNOSIS — Z951 Presence of aortocoronary bypass graft: Secondary | ICD-10-CM | POA: Diagnosis not present

## 2019-09-09 DIAGNOSIS — F339 Major depressive disorder, recurrent, unspecified: Secondary | ICD-10-CM | POA: Diagnosis present

## 2019-09-09 DIAGNOSIS — K7469 Other cirrhosis of liver: Secondary | ICD-10-CM | POA: Diagnosis present

## 2019-09-09 DIAGNOSIS — I25118 Atherosclerotic heart disease of native coronary artery with other forms of angina pectoris: Secondary | ICD-10-CM | POA: Diagnosis present

## 2019-09-09 DIAGNOSIS — I2510 Atherosclerotic heart disease of native coronary artery without angina pectoris: Secondary | ICD-10-CM | POA: Diagnosis present

## 2019-09-09 LAB — CBC
HCT: 39 % (ref 36.0–46.0)
Hemoglobin: 12.1 g/dL (ref 12.0–15.0)
MCH: 25.5 pg — ABNORMAL LOW (ref 26.0–34.0)
MCHC: 31 g/dL (ref 30.0–36.0)
MCV: 82.3 fL (ref 80.0–100.0)
Platelets: 169 10*3/uL (ref 150–400)
RBC: 4.74 MIL/uL (ref 3.87–5.11)
RDW: 15.7 % — ABNORMAL HIGH (ref 11.5–15.5)
WBC: 5.6 10*3/uL (ref 4.0–10.5)
nRBC: 0 % (ref 0.0–0.2)

## 2019-09-09 LAB — HEMOGLOBIN A1C
Hgb A1c MFr Bld: 7.6 % — ABNORMAL HIGH (ref 4.8–5.6)
Mean Plasma Glucose: 171.42 mg/dL

## 2019-09-09 LAB — GLUCOSE, CAPILLARY
Glucose-Capillary: 191 mg/dL — ABNORMAL HIGH (ref 70–99)
Glucose-Capillary: 210 mg/dL — ABNORMAL HIGH (ref 70–99)
Glucose-Capillary: 203 mg/dL — ABNORMAL HIGH (ref 70–99)
Glucose-Capillary: 145 mg/dL — ABNORMAL HIGH (ref 70–99)

## 2019-09-09 LAB — COMPREHENSIVE METABOLIC PANEL
ALT: 49 U/L — ABNORMAL HIGH (ref 0–44)
AST: 37 U/L (ref 15–41)
Albumin: 3.5 g/dL (ref 3.5–5.0)
Alkaline Phosphatase: 64 U/L (ref 38–126)
Anion gap: 13 (ref 5–15)
BUN: 21 mg/dL (ref 8–23)
CO2: 25 mmol/L (ref 22–32)
Calcium: 9 mg/dL (ref 8.9–10.3)
Chloride: 101 mmol/L (ref 98–111)
Creatinine, Ser: 0.97 mg/dL (ref 0.44–1.00)
GFR calc Af Amer: >60 mL/min (ref >60)
GFR calc non Af Amer: 58 mL/min — ABNORMAL LOW (ref >60)
Glucose, Bld: 216 mg/dL — ABNORMAL HIGH (ref 70–99)
Potassium: 4.3 mmol/L (ref 3.5–5.1)
Sodium: 139 mmol/L (ref 135–145)
Total Bilirubin: 0.9 mg/dL (ref 0.3–1.2)
Total Protein: 7.3 g/dL (ref 6.5–8.1)

## 2019-09-09 MED ORDER — RAMELTEON 8 MG PO TABS
8.0000 mg | ORAL_TABLET | Freq: Every day | ORAL | Status: DC
  Administered 2019-09-09 – 2019-09-10 (×2): 8 mg via ORAL
  Filled 2019-09-09 (×3): qty 1

## 2019-09-09 MED ORDER — PHENOL 1.4 % MT LIQD
1.0000 | OROMUCOSAL | Status: DC | PRN
  Filled 2019-09-09: qty 177

## 2019-09-09 MED ORDER — DEXTROSE 5 % IV SOLN: 250.0000 mg | INTRAVENOUS | Status: DC

## 2019-09-09 MED ORDER — SILDENAFIL CITRATE 20 MG PO TABS
10.0000 mg | ORAL_TABLET | Freq: Two times a day (BID) | ORAL | Status: DC
  Administered 2019-09-09 – 2019-09-10 (×3): 10 mg via ORAL
  Filled 2019-09-09 (×6): qty 1

## 2019-09-09 MED ORDER — AZITHROMYCIN 250 MG PO TABS
250.0000 mg | ORAL_TABLET | Freq: Every day | ORAL | Status: DC
  Administered 2019-09-09 – 2019-09-11 (×3): 250 mg via ORAL
  Filled 2019-09-09 (×3): qty 1

## 2019-09-09 NOTE — Progress Notes (Signed)
Attempted to ambulate with Pulse ox. Pt stated she is too tired at the moment and would like to try later. Will continue to monitor and attempt later.

## 2019-09-09 NOTE — Progress Notes (Signed)
SATURATION QUALIFICATIONS:  Patient Saturations on Room Air at Rest = 95%  Patient Saturations on Room Air while Ambulating = 83%  Patient Saturations on 1 Liters of oxygen while Ambulating = 95%  Pt requires 1L of oxygen to ambulate.

## 2019-09-09 NOTE — Progress Notes (Signed)
Family Medicine Teaching Service Daily Progress Note Intern Pager: [phone]  Patient name: Sharon Spears Medical record number: [id number] Date of birth: 1945/09/16 Age: 74 y.o. Gender: female  Primary Care Provider: Gildardo Pounds, NP Consultants: None Code Status: Full  Pt Overview and Major Events to Date:  8/20-patient admitted for CAP  Assessment and Plan:  CAP with superimposed COPD exacerbation Patient with recent diagnosis of community-acquired pneumonia who who has been on azithromycin since 8/23.  She was admitted for worsening shortness of breath and her azithromycin was transitioned to Rocephin.  Chest x-ray showed new airspace opacity within the right mid lower lung lobe suggestive of CAP.  White count was within normal limits at 7.9 and hemoglobin was 12.5.  Patient desaturated with ambulation.  Overnight she did well but was complaining of continued cough.  She was given Mucinex with little relief.  She is also given Tessalon Perles with little relief.  I evaluated her and she was sleeping peacefully.  This morning patient reports her only complaint is the chronic cough and she feels short of breath after these coughing spells. -Continue ceftriaxone and prednisone -Continue supplemental oxygen as needed to maintain O2 greater than 88% -Continuous pulse ox -Continue dextromethorphan, guaifenesin for cough suppression -Tessalon Perles 100 mg 3 times daily as needed ordered -Albuterol inhaler, budesonide, Singulair, Brovana -Vitals per routine -Ambulate with pulse ox  Hx A. fib Patient home medication include Eliquis 5 mg twice daily.  EKG noted atrial fibrillation. -Continue home medication regimen  HTN Patient home medication regimen includes amlodipine 10 mg, bisoprolol 5 mg.  NA 137, potassium 3.1.  HR 76 ranging 42-87.  Blood pressure this morning was 168/61 pulse rate is 56. -Continue home medications -Hold bisoprolol as patient has been bradycardic multiple  times in ED  T2 DM Patient home medication include metformin 500 mg twice daily.  CBG at presentation 149.  BUN 22 creatinine 0.77.  GFR greater than 60.  AST 42, ALT 46, ALP 71 T protein 7.6 albumin 3.6.  Blood sugars were elevated overnight in the 370s.  Bedtime coverage added overnight.  Hemoglobin A1c 7.6 - Sensitive sliding scale -At bedtime coverage added - CBG AC every qh  Depression and anxiety Patient has a recent history 08/2019 of being in the ED with suicidal ideation.  Home medication regimen includes Lexapro 10. Patient reports running out of Lexapro -Continue home medication  HFrEF Patient home medication includes furosemide 40 mg. BNP at presentation 614.3.  Last echo 07/10/2019 noted EF 55%. - continue home lasix   [place] Patient has a history of pH and takes sildenafil 10 PRN. CXR at presentation noted chronic small left pleural effusion. -Continue home medication  GERD Patient home medication regimen includes Pepcid 20 mg twice daily. -Continue home medication regimen  FEN/GI: Carb modified PPx: Eliquis  Disposition: Home pending improvement on oxygen requirement  Subjective:  Patient reports he is doing okay this morning but she has been coughing all night.  She reports that the IV antibiotics help with her cough but there is nothing else that will help with the cough.  Reports that her shortness of breath is doing okay but that she does feel short of breath when she starts coughing.  Denies any headaches, fever, chills, diarrhea, vomiting.  Reports that she has muscular pain when she coughs but otherwise no chest pain.  Objective: Temp:  [97.7 F (36.5 C)-98.8 F (37.1 C)] 98.1 F (36.7 C) (08/29 0538) Pulse Rate:  [42-87] 56 (08/29 0538)  Resp:  [17-37] 18 (08/29 0538) BP: (113-183)/(46-84) 168/61 (08/29 0538) SpO2:  [90 %-100 %] 97 % (08/29 0538) FiO2 (%):  [28 %] 28 % (08/28 1348) Weight:  [89 kg] 67 kg (08/29 0523) Physical Exam: General: Alert,  well-appearing, occasionally has coughing spells, on 3 L nasal cannula when I enter the room Cardiovascular: Regular rate and rhythm, no murmurs appreciated Respiratory: Normal work of breathing, patient is on 3 L nasal cannula when I enter the room and is satting 100% I cut this off and O2 sat remained above 95%.  Patient has a wheezes diffusely and crackles in lung bases bilaterally Abdomen: Soft, nontender, positive bowel sounds Extremities: Trace lower extremity edema noted  Laboratory: Recent Labs  Lab 09/03/19 2222 09/08/19 0534 09/09/19 0532  WBC 7.9 7.9 5.6  HGB 12.4 12.5 12.1  HCT 40.0 39.4 39.0  PLT 147* 188 169   Recent Labs  Lab 09/03/19 2222 09/08/19 0534 09/08/19 1200 09/09/19 0532  NA 136 137  --  139  K 4.1 3.5  --  4.3  CL 104 101  --  101  CO2 19* 24  --  25  BUN 23 22  --  21  CREATININE 0.95 0.77  --  0.97  CALCIUM 8.8* 9.0  --  9.0  PROT 7.9  --  7.6 7.3  BILITOT 0.9  --  0.7 0.9  ALKPHOS 73  --  71 64  ALT 50*  --  46* 49*  AST 55*  --  42* 37  GLUCOSE 209* 149*  --  216*    Hemoglobin A1c 7.6  Imaging/Diagnostic Tests: DG Chest Portable 1 View  Result Date: 09/08/2019 CLINICAL DATA:  Chest pain and SOB. EXAM: PORTABLE CHEST 1 VIEW COMPARISON:  09/03/2019. FINDINGS: Cardiac enlargement is stable. Previous median sternotomy and CABG. There is been interval development of airspace opacity within the right mid to lower lung concerning for pneumonia. Small left pleural effusion, unchanged. Linear areas of scarring are noted in the left midlung and left lower lobe. IMPRESSION: 1. New airspace opacity within the right mid to lower lung concerning for pneumonia. 2. Stable small left pleural effusion. Electronically Signed   By: Kerby Moors M.D.   On: 09/08/2019 10:40     Gifford Shave, MD 09/09/2019, 6:38 AM PGY-1, [place] Intern pager: [phone], text pages welcome

## 2019-09-09 NOTE — Evaluation (Signed)
Physical Therapy Evaluation Patient Details Name: Sharon Spears MRN: [id number] DOB: 08-25-1945 Today's Date: 09/09/2019   History of Present Illness  74 y.o. female presenting with acute onset SOB with cough. PMH is significant for COPD, A. fib, PAH, HF, T2 DM, HLD, HTN and GERD. Pt admitted for CAP with superimposed COPD exacerbation.  Clinical Impression  Pt presents to PT with no significant functional deficits. Pt is able to perform all mobility at a modI level, with reduced gait speed but without balance or significant gait deviations. Pt declines ambulating out of room due to fear of infection/germs, but does report that she has no trouble negotiating stairs at home and does not seem concerned with attempting stair training at this time. Pt requires 1L [place] to maintain sats at or above 88% during session, chart review reports the pt utilizes 2L [place] intermittently. Pt is encouraged to ambulate at least 3 times a day for multiple laps around the room to maintain her current level of function. Pt has no further acute PT needs at this time. Pt may benefit from receiving a 4 wheeled walker with seat prior to discharge to improve energy conservation during community mobility.    Follow Up Recommendations No PT follow up    Equipment Recommendations  Other (comment) (4 wheeled walker with seat)    Recommendations for Other Services       Precautions / Restrictions Precautions Precautions: None Restrictions Weight Bearing Restrictions: No      Mobility  Bed Mobility Overal bed mobility: Modified Independent                Transfers Overall transfer level: Independent Equipment used: None                Ambulation/Gait Ambulation/Gait assistance: Independent Gait Distance (Feet): 300 Feet Assistive device: None Gait Pattern/deviations: Step-to pattern Gait velocity: reduced Gait velocity interpretation: <1.8 ft/sec, indicate of risk for recurrent falls General Gait  Details: pt with slow but steady step through gait, pt walks 4-5 laps around room, declining to leave room due to fear of germs/COVID  Stairs            Wheelchair Mobility    Modified Rankin (Stroke Patients Only)       Balance Overall balance assessment: Mild deficits observed, not formally tested                                           Pertinent Vitals/Pain Pain Assessment: Faces Faces Pain Scale: Hurts a little bit Pain Location: chest with deep breath Pain Descriptors / Indicators: Grimacing Pain Intervention(s): Monitored during session    Home Living Family/patient expects to be discharged to:: Private residence Living Arrangements: Alone Available Help at Discharge: Personal care attendant (2 hrs daily) Type of Home: House ([place]) Home Access: Stairs to enter Entrance Stairs-Rails: Left Entrance Stairs-Number of Steps: 4 Home Layout: One level Home Equipment: Environmental consultant - 2 wheels;Bedside commode;Shower seat;Grab bars - toilet;Grab bars - tub/shower      Prior Function Level of Independence: Independent with assistive device(s)         Comments: pt reports utilizing a RW intermittently at home     Hand Dominance        Extremity/Trunk Assessment   Upper Extremity Assessment Upper Extremity Assessment: Overall WFL for tasks assessed    Lower Extremity Assessment Lower Extremity Assessment: Overall WFL for  tasks assessed    Cervical / Trunk Assessment Cervical / Trunk Assessment: Normal  Communication   Communication: No difficulties  Cognition Arousal/Alertness: Awake/alert Behavior During Therapy: WFL for tasks assessed/performed Overall Cognitive Status: Within Functional Limits for tasks assessed                                        General Comments General comments (skin integrity, edema, etc.): pt desats to 87% on RA after 2-3 minutes of ambulation, PT places pt on 1L [place] with steady SpO2 form  92-94%    Exercises     Assessment/Plan    PT Assessment Patent does not need any further PT services  PT Problem List         PT Treatment Interventions      PT Goals (Current goals can be found in the Care Plan section)       Frequency     Barriers to discharge        Co-evaluation               AM-PAC PT "6 Clicks" Mobility  Outcome Measure Help needed turning from your back to your side while in a flat bed without using bedrails?: None Help needed moving from lying on your back to sitting on the side of a flat bed without using bedrails?: None Help needed moving to and from a bed to a chair (including a wheelchair)?: None Help needed standing up from a chair using your arms (e.g., wheelchair or bedside chair)?: None Help needed to walk in hospital room?: None Help needed climbing 3-5 steps with a railing? : None 6 Click Score: 24    End of Session Equipment Utilized During Treatment: Oxygen Activity Tolerance: Patient tolerated treatment well Patient left: in chair;with call bell/phone within reach Nurse Communication: Mobility status      Time: 1550-2714 PT Time Calculation (min) (ACUTE ONLY): 21 min   Charges:   PT Evaluation $PT Eval Low Complexity: [place], PT, DPT Acute Rehabilitation Pager: [phone]   Zenaida Niece 09/09/2019, 2:53 PM

## 2019-09-10 ENCOUNTER — Other Ambulatory Visit: Payer: Self-pay | Admitting: Nurse Practitioner

## 2019-09-10 ENCOUNTER — Other Ambulatory Visit: Payer: Self-pay

## 2019-09-10 ENCOUNTER — Ambulatory Visit: Payer: Medicare Other | Admitting: Adult Health

## 2019-09-10 LAB — CBC
HCT: 37.4 % (ref 36.0–46.0)
Hemoglobin: 11.8 g/dL — ABNORMAL LOW (ref 12.0–15.0)
MCH: 25.8 pg — ABNORMAL LOW (ref 26.0–34.0)
MCHC: 31.6 g/dL (ref 30.0–36.0)
MCV: 81.8 fL (ref 80.0–100.0)
Platelets: 176 10*3/uL (ref 150–400)
RBC: 4.57 MIL/uL (ref 3.87–5.11)
RDW: 15.7 % — ABNORMAL HIGH (ref 11.5–15.5)
WBC: 12.8 10*3/uL — ABNORMAL HIGH (ref 4.0–10.5)
nRBC: 0 % (ref 0.0–0.2)

## 2019-09-10 LAB — GLUCOSE, CAPILLARY
Glucose-Capillary: 109 mg/dL — ABNORMAL HIGH (ref 70–99)
Glucose-Capillary: 219 mg/dL — ABNORMAL HIGH (ref 70–99)
Glucose-Capillary: 243 mg/dL — ABNORMAL HIGH (ref 70–99)

## 2019-09-10 LAB — TROPONIN I (HIGH SENSITIVITY): Troponin I (High Sensitivity): 12 ng/L (ref <18)

## 2019-09-10 MED ORDER — CEFDINIR 300 MG PO CAPS
300.0000 mg | ORAL_CAPSULE | Freq: Two times a day (BID) | ORAL | Status: DC
  Administered 2019-09-10 – 2019-09-11 (×3): 300 mg via ORAL
  Filled 2019-09-10 (×4): qty 1

## 2019-09-10 MED ORDER — ALBUTEROL SULFATE HFA 108 (90 BASE) MCG/ACT IN AERS
1.0000 | INHALATION_SPRAY | RESPIRATORY_TRACT | Status: DC
  Administered 2019-09-11 (×4): 2 via RESPIRATORY_TRACT

## 2019-09-10 MED ORDER — BUSPIRONE HCL 5 MG PO TABS
5.0000 mg | ORAL_TABLET | Freq: Three times a day (TID) | ORAL | Status: DC
  Administered 2019-09-10 – 2019-09-11 (×4): 5 mg via ORAL
  Filled 2019-09-10 (×6): qty 1

## 2019-09-10 MED ORDER — ALBUTEROL SULFATE (2.5 MG/3ML) 0.083% IN NEBU: 2.5000 mg | INHALATION_SOLUTION | RESPIRATORY_TRACT | Status: DC | PRN

## 2019-09-10 MED ORDER — BUSPIRONE HCL 15 MG PO TABS: 7.5000 mg | ORAL_TABLET | Freq: Three times a day (TID) | ORAL | Status: DC | PRN

## 2019-09-10 MED ORDER — NYSTATIN 100000 UNIT/ML MT SUSP
5.0000 mL | Freq: Four times a day (QID) | OROMUCOSAL | Status: DC
  Administered 2019-09-10 (×2): 500000 [IU] via ORAL
  Filled 2019-09-10 (×3): qty 5

## 2019-09-10 MED ORDER — BUDESONIDE 0.25 MG/2ML IN SUSP
0.2500 mg | Freq: Two times a day (BID) | RESPIRATORY_TRACT | Status: DC
  Filled 2019-09-10: qty 2

## 2019-09-10 MED ORDER — ARFORMOTEROL TARTRATE 15 MCG/2ML IN NEBU
15.0000 ug | INHALATION_SOLUTION | Freq: Two times a day (BID) | RESPIRATORY_TRACT | Status: DC
  Filled 2019-09-10 (×4): qty 2

## 2019-09-10 MED ORDER — ALBUTEROL SULFATE HFA 108 (90 BASE) MCG/ACT IN AERS
1.0000 | INHALATION_SPRAY | RESPIRATORY_TRACT | Status: DC | PRN
  Administered 2019-09-10: 2 via RESPIRATORY_TRACT
  Filled 2019-09-10: qty 6.7

## 2019-09-10 MED ORDER — ALBUTEROL SULFATE HFA 108 (90 BASE) MCG/ACT IN AERS
1.0000 | INHALATION_SPRAY | RESPIRATORY_TRACT | Status: DC
Start: 2019-09-10 — End: 2019-09-10

## 2019-09-10 MED ORDER — BISOPROLOL FUMARATE 5 MG PO TABS
5.0000 mg | ORAL_TABLET | Freq: Every day | ORAL | Status: DC
  Administered 2019-09-10 – 2019-09-11 (×2): 5 mg via ORAL
  Filled 2019-09-10 (×2): qty 1

## 2019-09-10 MED ORDER — LIDOCAINE VISCOUS HCL 2 % MT SOLN: 15.0000 mL | OROMUCOSAL | 0 refills | Status: AC | PRN

## 2019-09-10 NOTE — Evaluation (Signed)
Occupational Therapy Evaluation Patient Details Name: Sharon Spears MRN: [id number] DOB: 18-Oct-1945 Today's Date: 09/10/2019    History of Present Illness 74 y.o. female presenting with acute onset SOB with cough. PMH is significant for COPD, A. fib, PAH, HF, T2 DM, HLD, HTN and GERD. Pt admitted for CAP with superimposed COPD exacerbation.   Clinical Impression   Pt was assisted for IADL by her aide and able to perform self care with set up. Pt reports this is a bad day for her. Found in bed having spilled her water and with wet brief. Pt transferred to [facility] while bed linen changed modified independently. She did not feel she could walk to the bathroom due to her breathing/fatigue. Pt keeping her eyes closed much of session. She maintained Sp02 at 92 % on 2L 02. Pt likely to progress well and not require follow up OT. Left energy conservation strategy written handout in her room for her review. Will follow acutely.    Follow Up Recommendations  No OT follow up    Equipment Recommendations  None recommended by OT    Recommendations for Other Services       Precautions / Restrictions Precautions Precautions: None      Mobility Bed Mobility Overal bed mobility: Modified Independent             General bed mobility comments: slow, but no assist  Transfers Overall transfer level: Modified independent Equipment used: None             General transfer comment: transferred only--to and from [place] Overall balance assessment: Mild deficits observed, not formally tested                                         ADL either performed or assessed with clinical judgement   ADL                                         General ADL Comments: Pt is overall functioning at a set up to supervision level. Sp02 92% on 2L with HR of 84     Vision Patient Visual Report: No change from baseline       Perception     Praxis       Pertinent Vitals/Pain Pain Assessment: No/denies pain     Hand Dominance Right   Extremity/Trunk Assessment Upper Extremity Assessment Upper Extremity Assessment: Overall WFL for tasks assessed   Lower Extremity Assessment Lower Extremity Assessment: Overall WFL for tasks assessed   Cervical / Trunk Assessment Cervical / Trunk Assessment: Normal   Communication Communication Communication: No difficulties   Cognition Arousal/Alertness: Awake/alert Behavior During Therapy: Flat affect Overall Cognitive Status: Within Functional Limits for tasks assessed                                 General Comments: pt not feeling well, keeping eyes closed much of session    General Comments       Exercises     Shoulder Instructions      Home Living Family/patient expects to be discharged to:: Private residence Living Arrangements: Alone Available Help at Discharge: Personal care attendant (2 hours a day) Type of Home: Mobile home Home Access:  Stairs to enter [employer] of Steps: 4 Entrance Stairs-Rails: Left Home Layout: One level         Bathroom Toilet: Standard     Home Equipment: Walker - 2 wheels;Bedside commode;Shower seat;Grab bars - toilet;Grab bars - tub/shower          Prior Functioning/Environment Level of Independence: Independent with assistive device(s)        Comments: pt reports utilizing a RW intermittently at home        OT Problem List: Decreased strength;Decreased activity tolerance      OT Treatment/Interventions: Self-care/ADL training;Energy conservation;DME and/or AE instruction;Patient/family education    OT Goals(Current goals can be found in the care plan section) Acute Rehab OT Goals Patient Stated Goal: to breathe better OT Goal Formulation: With patient Time For Goal Achievement: 09/24/19 Potential to Achieve Goals: Good ADL Goals Pt Will Perform Grooming: (P) Independently;standing (3  activities) Pt Will Transfer to Toilet: (P) Independently;ambulating;regular height toilet Pt Will Perform Toileting - Clothing Manipulation and hygiene: (P) Independently;sit to/from stand Additional ADL Goal #1: (P) Pt will generalize energy conservation and pursed lip breathing techniques in ADL and mobility. Additional ADL Goal #2: (P) Pt will complete basic ADL independently.  OT Frequency: Min 2X/week   Barriers to D/C: Decreased caregiver support          Co-evaluation              AM-PAC OT "6 Clicks" Daily Activity     Outcome Measure Help from another person eating meals?: None Help from another person taking care of personal grooming?: A Little Help from another person toileting, which includes using toliet, bedpan, or urinal?: A Little Help from another person bathing (including washing, rinsing, drying)?: A Little Help from another person to put on and taking off regular upper body clothing?: None Help from another person to put on and taking off regular lower body clothing?: A Little 6 Click Score: 20   End of Session Equipment Utilized During Treatment: Oxygen  Activity Tolerance: Patient limited by lethargy;Patient limited by fatigue Patient left: in bed;with call bell/phone within reach  OT Visit Diagnosis: Other (comment) (decreased activity tolerance)                Time: 9037-9558 OT Time Calculation (min): 25 min Charges:  OT General Charges $OT Visit: 1 Visit OT Evaluation $OT Eval Low Complexity: 1 Low OT Treatments $Self Care/Home Management : 8-22 mins  Nestor Lewandowsky, OTR/L Acute Rehabilitation Services Pager: [phone] Office: [phone]  Sharon Spears 09/10/2019, 10:31 AM

## 2019-09-10 NOTE — Progress Notes (Addendum)
Family Medicine Teaching Service Daily Progress Note Intern Pager: [phone]  Patient name: Sharon Spears Medical record number: [id number] Date of birth: 06/16/1945 Age: 74 y.o. Gender: female  Primary Care Provider: Gildardo Pounds, NP Consultants: None Code Status: FULL  Pt Overview and Major Events to Date:  8/20-patient admitted for CAP  Assessment and Plan: CAP with superimposed COPD exacerbation Patient with recent diagnosis of community-acquired pneumonia who who has been on azithromycin since 8/23.  She was admitted for worsening shortness of breath and her azithromycin was transitioned to Rocephin.  Chest x-ray showed new airspace opacity within the right mid lower lung lobe suggestive of CAP.   Patient desaturated with ambulation.  Overnight she requested nitrates for chest pain; however this was refused as she was recently started on Sildafenil by her cardiologist. Patient chest pain is often secondary to cough although she does have [place].  Breathing >90% on 2L [place]. PT saw patient and do not recommend follow-up. Patient was ambulated and desatted to 83% on RA. Patient satted to 95% on 1L [place]. RN note this AM stated that patient is removing her pulse ox.  Patient has been refusing respiratory therapy.  Spoke with patient and explained to her why she can not refuse her respiratory therapy.  We may consider changing nebulizers to inhalers if she would like this. -Continue azithromycin prednisone -Transition ceftriaxone to cefdinir -Continue supplemental oxygen as needed to maintain O2 greater than 88% -Continuous pulse ox -Continue dextromethorphan, guaifenesin for cough suppression -Tessalon [place] 100 mg 3 times daily as needed ordered -Albuterol inhaler, budesonide, [place] - discontinue singulair -Vitals per routine -Ambulate with pulse ox  Hx A. fib Patient home medication include Eliquis 5 mg twice daily.EKG noted rate controlled atrial fibrillation. -Continue home  medication regimen  HTN Patient home medication regimen includes amlodipine 10 mg, bisoprolol 5 mg. NA 137, potassium 3.1.HR 76 ranging 57-65.  Blood pressure this morning was 159/60 pulse rate is 63. -Continue home medications -Restart bisoprolol  T2 DM Patient home medication include metformin 500 mgtwice daily.CBG this AM 109. -Sensitive sliding scale -CBG AC and QH  Depression and anxiety Patient has a recent history 08/2019 of being in the ED with suicidal ideation. Home medication regimen includes Lexapro 10.Patient reports running out of Lexapro.  Patient appears to have anxiety associated with her health and is often asking for more medications but is unclear about what is bothering her at the time.  Patient often does not want to participate in treatment regimens that have already been ordered to address her elements. -Continue home medication -Start BuSpar 5 mg twice daily  HFrEF Patient home medication includes furosemide61m.BNP at presentation 614.3. Last echo 07/10/2019 noted EF 55%. - continuehome lasix  PAH Patient has a history of pH and takessildenafil10 PRN.  Spoke with patient about the importance of discontinuing her nitroglycerin at home. CXR at presentation noted chronic small left pleural effusion. -Continue home medication  GERD Patient home medication regimen includes Pepcid 20 mg twice daily. -Continue home medication regimen  FEN/GI: Carb modified PPx: Eliquis  Disposition: Home pending improvement on oxygen requirement   Subjective:  Patient had chest pain overnight was requesting nitroglycerin which was refused as patient was recently put on sildenafil by cardiologist.  Patient also received ramelteon for sleep, patient requested "something to help her sleep" but did not want melatonin.  This a.m. patient was refusing her RT therapy scheduled treatments.  Patient continued to report chest pain with cough and shortness of  breath,  no abdominal pain.    Objective: Temp:  [98 F (36.7 C)-99.3 F (37.4 C)] 98.2 F (36.8 C) (08/30 0549) Pulse Rate:  [57-65] 63 (08/30 0549) Resp:  [18] 18 (08/30 0549) BP: (146-159)/(60-67) 159/60 (08/30 0549) SpO2:  [92 %-97 %] 96 % (08/30 0549) Weight:  [68.8 kg] 68.8 kg (08/30 0549) Physical Exam: General: NAD Cardiovascular: Rate controlled A. fib, no murmur detected Respiratory: Diffuse inspiratory and expiratory wheeze in all 4 lung lobes, patient breathing on 2 L  able to speak in full sentences Abdomen: No pain to palpation this a.m., normoactive bowel sounds Extremities: 1+ pitting edema to the shin, distal pulses intact  Laboratory: Recent Labs  Lab 09/03/19 2222 09/08/19 0534 09/09/19 0532  WBC 7.9 7.9 5.6  HGB 12.4 12.5 12.1  HCT 40.0 39.4 39.0  PLT 147* 188 169   Recent Labs  Lab 09/03/19 2222 09/08/19 0534 09/08/19 1200 09/09/19 0532  NA 136 137  --  139  K 4.1 3.5  --  4.3  CL 104 101  --  101  CO2 19* 24  --  25  BUN 23 22  --  21  CREATININE 0.95 0.77  --  0.97  CALCIUM 8.8* 9.0  --  9.0  PROT 7.9  --  7.6 7.3  BILITOT 0.9  --  0.7 0.9  ALKPHOS 73  --  71 64  ALT 50*  --  46* 49*  AST 55*  --  42* 37  GLUCOSE 209* 149*  --  216*      Imaging/Diagnostic Tests: No results found. None new.  Freida Busman, MD 09/10/2019, 7:51 AM PGY-1, [place] Intern pager: [phone], text pages welcome

## 2019-09-10 NOTE — Progress Notes (Signed)
Pt has taken off the pulse ox sensor again stating "they start to itch and I can't take it". Monitoring pt continues.

## 2019-09-10 NOTE — Progress Notes (Signed)
Pt stated to writer "when I feel like this my doctor told me that I can take the nitroglycerin". When writer checked home med list nitro was not listed. Called on call FP and was called back stating that from records, nitro was d/c'd and she should not be taking since she is now taking sildenfil. FP states that they will check in AM concerning this. Explained this to pt, she then began to explain all the side effects of meds that she is taking. She also asked for SW so that she could possibly go to LTC facility. Information to be passed on to AM nurse.

## 2019-09-10 NOTE — Progress Notes (Signed)
Interim progress note  Received page from RN concerned that patient continues to have increased work of breathing after not doing her morning nebulizer treatments.  RN would like as needed order.  Saw patient she continues to have inspiratory and expiratory wheezing and does have increased work of breathing but is able to speak in full sentences similar to how she was at presentation.  Also noticed the patient had dinner tray in front of her that consisted of spaghetti with red sauce and shrimp with red sauce.  This meal will likely cause significant acid reflux.  We will discontinue current meal plan and patient will be placed on soft diet. Assessment and plan Have ordered as needed albuterol inhaler.  Will give once she receives from pharmacy.  Change diet to soft.   Spoke with patient's son Cori Razor Patient son has been aware that patient is in the hospital he tries to speak with patient everyday. Patient's son is aware that patient has a hx of "breathing problems." Made son aware that the patient refused her nebulizer treatments this AM and has been struggling a bit since. Son reports that patient may decide at times to stop her medication. Made son aware of past week of visits to the ED including suicidal ideation. Told son that we discontinued Singulair but patient has other medications on. Son would like PCP to made aware that this medication has been discontinued.  Patient reports that his sister made him aware of SI last week and he feels that the patient needs a therapist/ counselor or psychiatrist. Son reports that patient has not been "right" since her other son died 9 years ago. Son also reported that patient had open heart surgery 2.5 years ago and before in the 1990's. Son is also aware that the patient has had some moments where she forgets "things." Son did not seem by the events in the hospital but does wish to be kept updated.

## 2019-09-10 NOTE — Progress Notes (Signed)
Interim progress note During rounds received page from RN that patient was complaining of significant chest pain.  Went to see patient stat EKG was ordered and stat troponins ordered.  Patient reported subxiphoid process chest pain.  EKG was not concerning for ACS.  Patient began belching after EKG was completed.  Chest pain was likely 2/2 reflux.  Patient was in her regular rate controlled A. fib at the time. Assessment and plan Patient is likely suffering from reflux.  Have stressed with patient the importance of taking her scheduled nebulizer treatments.  May consider inhaler.  We will restart bisoprolol.  Make sure patient understands that should she have chest pain at home like this in the future she should not take nitroglycerin as she is already also down [place] and pain is likely 2/2 acid reflux.  May consider readjusting patient's diet.

## 2019-09-10 NOTE — TOC Initial Note (Signed)
Transition of Care [facility]) - Initial/Assessment Note    Patient Details  Name: Sharon Spears MRN: [id number] Date of Birth: 06-28-1945  Transition of [facility]) CM/SW Contact:    Marilu Favre, RN Phone Number: 09/10/2019, 10:35 AM  Clinical Narrative:                 Spoke to patient at discharge. Patient from home alone. Daughter lives in [place] and  son lives in [place].  PT recommendations Rolator and possible home oxygen. NCM will follow.   Patient stated her social worker at [facility] is working on getting her a home "attended" . Patient realizes personal care services takes time and will not be arranged prior to discharge from hospital.    Expected Discharge Plan: Home/Self Care Barriers to Discharge: Continued Medical Work up   Patient Goals and CMS Choice Patient states their goals for this hospitalization and ongoing recovery are:: to return to room CMS Medicare.gov Compare Post Acute Care list provided to:: Patient Choice offered to / list presented to : Patient  Expected Discharge Plan and Services Expected Discharge Plan: Home/Self Care   Discharge Planning Services: CM Consult Post Acute Care Choice: Durable Medical Equipment Living arrangements for the past 2 months: Single Family Home                 DME Arranged: Walker rolling with seat, Oxygen DME Agency: [employer] Date DME Agency Contacted: 09/10/19 Time DME Agency Contacted: 1034 Representative spoke with at DME Agency: Will call [employer] closer to discharge [place] Arranged: NA          Prior Living Arrangements/Services Living arrangements for the past 2 months: Single Family Home Lives with:: Self Patient language and need for interpreter reviewed:: Yes Do you feel safe going back to the place where you live?: Yes      Need for Family Participation in Patient Care: No (Comment) Care giver support system in place?: No (comment) Current home services: DME Criminal  Activity/Legal Involvement Pertinent to Current Situation/Hospitalization: No - Comment as needed  Activities of Daily Living      Permission Sought/Granted   Permission granted to share information with : No              Emotional Assessment Appearance:: Appears stated age Attitude/Demeanor/Rapport: Engaged Affect (typically observed): Accepting Orientation: : Oriented to Self, Oriented to Place, Oriented to  Time, Oriented to Situation Alcohol / Substance Use: Not Applicable Psych Involvement: No (comment)  Admission diagnosis:  CAP (community acquired pneumonia) [J18.9] Community acquired pneumonia, unspecified laterality [J18.9] Dyspnea [R06.00] Patient Active Problem List   Diagnosis Date Noted   Dyspnea 09/09/2019   CAP (community acquired pneumonia) 09/08/2019   Severe persistent asthma 02/14/2019   Medication monitoring encounter 02/14/2019   Episode of recurrent major depressive disorder ([place]) 01/24/2019   Coronary artery disease of native artery of native heart with stable angina pectoris ([place]) 01/24/2019   Thyroid mass 12/01/2018   Renal mass 12/01/2018   Complex care coordination 12/01/2018   Other cirrhosis of liver ([place]) 07/20/2018   Unspecified atrial fibrillation ([place]) 07/20/2018   Cough 02/08/2018   Dyspnea on exertion 35/00/9381   Chronic systolic heart failure ([place]) 09/25/2017   Thrombocytopenia ([place]) 03/20/2015   Type 2 diabetes mellitus with other specified complication ([place]) 82/99/3716   HTN (hypertension) 03/20/2015   S/P CABG (coronary artery bypass graft) 03/20/2015   PCP:  Gildardo Pounds, NP Pharmacy:   [facility] - [place], [place] [address] [place] [address] Phone: [phone] Fax: [phone]  [id number] [place]: [phone]:  [phone] [place] [place]Phone: [phone]: [phone]    Social Determinants of Health (SDOH) Interventions    Readmission Risk Interventions No flowsheet data found.

## 2019-09-10 NOTE — Telephone Encounter (Signed)
Needs to be swabbed for strep throat. If we are not able to do that in our office she will need to go to urgent care.

## 2019-09-10 NOTE — Progress Notes (Signed)
Pt refused scheduled breathing treatments this morning. Nasal cannula placed back in pt nose. Pt in no distress at this time and asking when can she eat her breakfast. RT will continue to monitor.

## 2019-09-11 LAB — CBC WITH DIFFERENTIAL/PLATELET
Abs Immature Granulocytes: 0.05 10*3/uL (ref 0.00–0.07)
Basophils Absolute: 0 10*3/uL (ref 0.0–0.1)
Basophils Relative: 0 %
Eosinophils Absolute: 0 10*3/uL (ref 0.0–0.5)
Eosinophils Relative: 0 %
HCT: 37.7 % (ref 36.0–46.0)
Hemoglobin: 11.6 g/dL — ABNORMAL LOW (ref 12.0–15.0)
Immature Granulocytes: 1 %
Lymphocytes Relative: 13 %
Lymphs Abs: 1.4 10*3/uL (ref 0.7–4.0)
MCH: 24.9 pg — ABNORMAL LOW (ref 26.0–34.0)
MCHC: 30.8 g/dL (ref 30.0–36.0)
MCV: 81.1 fL (ref 80.0–100.0)
Monocytes Absolute: 0.8 10*3/uL (ref 0.1–1.0)
Monocytes Relative: 8 %
Neutro Abs: 8.4 10*3/uL — ABNORMAL HIGH (ref 1.7–7.7)
Neutrophils Relative %: 78 %
Platelets: 187 10*3/uL (ref 150–400)
RBC: 4.65 MIL/uL (ref 3.87–5.11)
RDW: 15.5 % (ref 11.5–15.5)
WBC: 10.7 10*3/uL — ABNORMAL HIGH (ref 4.0–10.5)
nRBC: 0 % (ref 0.0–0.2)

## 2019-09-11 LAB — COMPREHENSIVE METABOLIC PANEL
ALT: 40 U/L (ref 0–44)
AST: 27 U/L (ref 15–41)
Albumin: 3.2 g/dL — ABNORMAL LOW (ref 3.5–5.0)
Alkaline Phosphatase: 62 U/L (ref 38–126)
Anion gap: 10 (ref 5–15)
BUN: 22 mg/dL (ref 8–23)
CO2: 28 mmol/L (ref 22–32)
Calcium: 9 mg/dL (ref 8.9–10.3)
Chloride: 98 mmol/L (ref 98–111)
Creatinine, Ser: 0.93 mg/dL (ref 0.44–1.00)
GFR calc Af Amer: >60 mL/min (ref >60)
GFR calc non Af Amer: >60 mL/min (ref >60)
Glucose, Bld: 172 mg/dL — ABNORMAL HIGH (ref 70–99)
Potassium: 4 mmol/L (ref 3.5–5.1)
Sodium: 136 mmol/L (ref 135–145)
Total Bilirubin: 1 mg/dL (ref 0.3–1.2)
Total Protein: 7 g/dL (ref 6.5–8.1)

## 2019-09-11 LAB — GLUCOSE, CAPILLARY
Glucose-Capillary: 131 mg/dL — ABNORMAL HIGH (ref 70–99)
Glucose-Capillary: 185 mg/dL — ABNORMAL HIGH (ref 70–99)

## 2019-09-11 MED ORDER — NYSTATIN 100000 UNIT/ML MT SUSP: 5.0000 mL | Freq: Four times a day (QID) | OROMUCOSAL | 0 refills | Status: AC

## 2019-09-11 MED ORDER — PREDNISONE 20 MG PO TABS: 40.0000 mg | ORAL_TABLET | Freq: Every day | ORAL | 0 refills | Status: AC

## 2019-09-11 MED ORDER — ALBUTEROL SULFATE (2.5 MG/3ML) 0.083% IN NEBU: 2.5000 mg | INHALATION_SOLUTION | RESPIRATORY_TRACT | Status: DC

## 2019-09-11 MED ORDER — ALBUTEROL SULFATE HFA 108 (90 BASE) MCG/ACT IN AERS: 2.0000 | INHALATION_SPRAY | RESPIRATORY_TRACT | Status: DC

## 2019-09-11 MED ORDER — BUSPIRONE HCL 5 MG PO TABS
5.0000 mg | ORAL_TABLET | Freq: Three times a day (TID) | ORAL | 0 refills | Status: DC
Start: 2019-09-11 — End: 2019-09-21

## 2019-09-11 MED ORDER — LOSARTAN POTASSIUM 50 MG PO TABS
50.0000 mg | ORAL_TABLET | Freq: Every day | ORAL | 0 refills | Status: DC
Start: 2019-09-12 — End: 2019-09-21

## 2019-09-11 MED ORDER — AZITHROMYCIN 250 MG PO TABS: 250.0000 mg | ORAL_TABLET | Freq: Every day | ORAL | 0 refills | Status: AC

## 2019-09-11 MED ORDER — LOSARTAN POTASSIUM 50 MG PO TABS
50.0000 mg | ORAL_TABLET | Freq: Every day | ORAL | Status: DC
  Filled 2019-09-11: qty 1

## 2019-09-11 MED ORDER — CEFDINIR 300 MG PO CAPS: 300.0000 mg | ORAL_CAPSULE | Freq: Two times a day (BID) | ORAL | 0 refills | Status: AC

## 2019-09-11 NOTE — Discharge Summary (Addendum)
[facility] Discharge Summary  Patient name: Sharon Spears Medical record number: [id number] Date of birth: 05/09/1945 Age: 74 y.o. Gender: female Date of Admission: 09/08/2019  Date of Discharge: 09/11/2019 Admitting Physician: Leeanne Rio, MD  Primary Care Provider: Gildardo Pounds, NP Consultants: None  Indication for Hospitalization: Acute on chronic hypoxic respiratory failure  Discharge Diagnoses/Problem List:  CAP with superimposed COPD exacerbation  Acute on chronic hypoxic respiratory failure History of A. fib HTN T2DM Depression/Anxiety HFrEF GERD  Disposition: Home with O2  Discharge Condition: Stable  Discharge Exam:   General: Very anxious, rapid speech, preoccupied with heart rate, redirectable Cardiovascular: Did not appear to be in A. fib this a.m., bradycardic, no murmur detected Respiratory: Expiratory wheeze in bilateral lower lobes significantly improved from yesterday, good chest expansion Abdomen: Normoactive bowel sounds, no pain to palpation  Brief Hospital Course:   Sharon Spears is a 74 y.o. female presenting with acute onset SOB with cough. PMH is significant for COPD, A. fib, PAH, HF, T2 DM, HLD, HTN and GERD.   CAP with superimposed COPD Exacerbation  Patient satting well on baseline 2 L [place].  CXR at presentation showed new airspace opacity within the right mid lower lung lobe suggestive of CAP.  Troponins x2 at admission were negative for ACS causing chest pain.  Patient continued to report that coughing caused sternal pain. Started patient on azithromycin, ceftriaxone and prednisone.  Patient continued to stabilize.  Continue patient home medications of dextromethorphan/guaifenesin for cough suppression.   Also continued patient albuterol inhaler budesonide and Brovana from home.  Discontinued patient Singulair inpatient as patient had a recent ED presentation with suicidality.  Patient desatted to 85% on room  air with ambulation. 2 L of oxygen patient ambulated at 91% O2 sat.  As patient stabilized transitioned ceftriaxone to cefdinir. OT saw patient and did not recommend follow-up.    Hx A. Fib Continued patient home medication  HTN Continue patient amlodipine and held bisoprolol at admission due to bradycardia.  Restarted bisoprolol as patient continued to be bradycardiac and suggestive that this is patient's baseline and had been when she was started on bisoprolol recently.  Despite patient reporting that she did not take medication at home also restarted patient on losartan as she remained hypertensive with bisoprolol and amlodipine.  Per EMR patient should still be taking losartan at home.  T2 DM Patient CBG checked before meals and at bedtime. Patient placed on sensitive sliding scale.  Patient was on inhaled steroids at baseline and continued this medication regimen inpatient as well as oral steroids due to COPD exacerbation.  Depression and anxiety Restarted patient Lexapro she reported that she was out of it at home.  Patient continued to express anxiety during hospitalization and BuSpar was added to address this.  Patient also reported insomnia and did not want melatonin.  Patient was provided ramelteon.  Made aware by son next day after receiving ramelteon the patient has had a history of substance use disorder with sleeping pills.  Discontinued ramelteon with this new information.  HFrEF Continued patient home medication.  [place] CXR presentation noted small chronic left pleural effusion.  Continued home medication.  Patient did have episode of requesting nitroglycerin overnight however she cannot have nitroglycerin as she is on full definitional.  Reiterated to patient that she should not be taking nitroglycerin at home for intermediate chest pain.  Chest pain is likely 2/2 reflux.  GERD Continued home Pepcid.  Patient had episode during hospitalization  of significant chest pain.  EKG was  not significant for ACS.  Decreased diet to soft patient had no further episodes.    Issues for Follow Up:  Discontinued inpatient and at discharge due to Singulair's significant correlation with suicidal ideation and patient had recent ED visit for suicidality. Follow-up with patient concerning depression and anxiety and desire for counseling/therapy or psychiatry.  Patient is currently on 2 psychiatric medications and has a history of grief. Follow-up with patient concerning previous history of addiction to sleeping pills, per son. Recommend Cognitive evaluation as outpatient.  5.   Reevaluate if patient is taking losartan outpatient  Significant Procedures: None  Significant Labs and Imaging:  Recent Labs  Lab 09/09/19 0532 09/10/19 1522 09/11/19 0414  WBC 5.6 12.8* 10.7*  HGB 12.1 11.8* 11.6*  HCT 39.0 37.4 37.7  PLT 169 176 187   Recent Labs  Lab 09/08/19 0534 09/08/19 0534 09/08/19 1200 09/09/19 0532 09/11/19 0414  NA 137  --   --  139 136  K 3.5   < >  --  4.3 4.0  CL 101  --   --  101 98  CO2 24  --   --  25 28  GLUCOSE 149*  --   --  216* 172*  BUN 22  --   --  21 22  CREATININE 0.77  --   --  0.97 0.93  CALCIUM 9.0  --   --  9.0 9.0  ALKPHOS  --   --  71 64 62  AST  --   --  42* 37 27  ALT  --   --  46* 49* 40  ALBUMIN  --   --  3.6 3.5 3.2*   < > = values in this interval not displayed.    DG Chest Portable 1 View  Result Date: 09/08/2019 CLINICAL DATA:  Chest pain and SOB. EXAM: PORTABLE CHEST 1 VIEW COMPARISON:  09/03/2019. FINDINGS: Cardiac enlargement is stable. Previous median sternotomy and CABG. There is been interval development of airspace opacity within the right mid to lower lung concerning for pneumonia. Small left pleural effusion, unchanged. Linear areas of scarring are noted in the left midlung and left lower lobe. IMPRESSION: 1. New airspace opacity within the right mid to lower lung concerning for pneumonia. 2. Stable small left pleural  effusion. Electronically Signed   By: Kerby Moors M.D.   On: 09/08/2019 10:40    Results/Tests Pending at Time of Discharge: None  Discharge Medications:   Allergies as of 09/11/2019       Reactions   Contrast Media [iodinated Diagnostic Agents] Anaphylaxis   Per pt she had anaphylaxis reaction to contrast media in the past. States she couldn't breathe and they had to give her epi.    Adhesive [tape] Itching   Penicillins Other (See Comments)   GI upset - reaction to capsules - not injection Has patient had a PCN reaction causing immediate rash, facial/tongue/throat swelling, SOB or lightheadedness with hypotension: No Has patient had a PCN reaction causing severe rash involving mucus membranes or skin necrosis: No Has patient had a PCN reaction that required hospitalization: No Has patient had a PCN reaction occurring within the last 10 years: No If all of the above answers are "NO", then may proceed with Cephalosporin use.        Medication List     STOP taking these medications    diphenhydrAMINE 25 MG tablet Commonly known as: BENADRYL   montelukast  10 MG tablet Commonly known as: SINGULAIR       TAKE these medications    Accu-Chek Aviva Plus test strip Generic drug: glucose blood USE WITH GLUCOMETER TO CHECK BLOOD SUGARS ONCE DAILY   Accu-Chek Aviva Plus w/Device Kit USE TO CHECK BLOOD SUGARS ONCE DAILY   acetaminophen 500 MG tablet Commonly known as: TYLENOL Take 500 mg by mouth at bedtime as needed for headache (pain). What changed: Another medication with the same name was removed. Continue taking this medication, and follow the directions you see here.   albuterol 108 (90 Base) MCG/ACT inhaler Commonly known as: VENTOLIN HFA INHALE 2 PUFFS INTO THE LUNGS EVERY 6 HOURS AS NEEDED FOR WHEEZE What changed: See the new instructions.   amLODipine 10 MG tablet Commonly known as: NORVASC Take 10 mg by mouth daily.   apixaban 5 MG Tabs tablet Commonly  known as: ELIQUIS Take 1 tablet (5 mg total) by mouth 2 (two) times daily.   arformoterol 15 MCG/2ML Nebu Commonly known as: BROVANA Take 2 mLs (15 mcg total) by nebulization 2 (two) times daily.   atorvastatin 40 MG tablet Commonly known as: LIPITOR Take 1 tablet (40 mg total) by mouth daily.   azithromycin 250 MG tablet Commonly known as: ZITHROMAX Take 1 tablet (250 mg total) by mouth daily for 2 days. Start taking on: September 12, 2019 What changed:  how much to take how to take this when to take this additional instructions   bisoprolol 10 MG tablet Commonly known as: ZEBETA Take 0.5 tablets (5 mg total) by mouth daily.   budesonide 0.25 MG/2ML nebulizer solution Commonly known as: Pulmicort Take 2 mLs (0.25 mg total) by nebulization 2 (two) times daily.   busPIRone 5 MG tablet Commonly known as: BUSPAR Take 1 tablet (5 mg total) by mouth 3 (three) times daily.   cefdinir 300 MG capsule Commonly known as: OMNICEF Take 1 capsule (300 mg total) by mouth every 12 (twelve) hours for 3 days.   dextromethorphan-guaiFENesin 30-600 MG 12hr tablet Commonly known as: MUCINEX DM Take 1 tablet by mouth 2 (two) times daily.   escitalopram 10 MG tablet Commonly known as: LEXAPRO Take 10 mg by mouth daily.   famotidine 20 MG tablet Commonly known as: PEPCID Take 20 mg by mouth daily as needed (acid reflux).   furosemide 20 MG tablet Commonly known as: LASIX Take 1 tablet (20 mg total) by mouth 2 (two) times daily. What changed:  how much to take when to take this   lidocaine 2 % solution Commonly known as: XYLOCAINE Use as directed 15 mLs in the mouth or throat as needed for up to 7 days for mouth pain.   losartan 50 MG tablet Commonly known as: COZAAR Take 1 tablet (50 mg total) by mouth daily. Start taking on: September 12, 2019 What changed:  medication strength how much to take   metFORMIN 500 MG tablet Commonly known as: GLUCOPHAGE Take 500 mg by mouth  2 (two) times daily with a meal.   nystatin 100000 UNIT/ML suspension Commonly known as: MYCOSTATIN Take 5 mLs (500,000 Units total) by mouth 4 (four) times daily.   Opsumit 10 MG tablet Generic drug: macitentan Take 1 tablet (10 mg total) by mouth daily.   predniSONE 20 MG tablet Commonly known as: DELTASONE Take 2 tablets (40 mg total) by mouth daily with breakfast. Start taking on: September 12, 2019   sildenafil 20 MG tablet Commonly known as: Revatio Take 1 tablet (20 mg  total) by mouth 3 (three) times daily. What changed:  how much to take when to take this               Durable Medical Equipment  (From admission, onward)           Start     Ordered   09/11/19 1055  For home use only DME oxygen  Once       Question Answer Comment  Length of Need 6 Months   Mode or (Route) Nasal cannula   Liters per Minute 2   Frequency Continuous (stationary and portable oxygen unit needed)   Oxygen conserving device Yes   Oxygen delivery system Gas      09/11/19 1057   09/11/19 0000  For home use only DME oxygen       Question Answer Comment  Length of Need 6 Months   Mode or (Route) Nasal cannula   Liters per Minute 2   Frequency Continuous (stationary and portable oxygen unit needed)   Oxygen conserving device Yes   Oxygen delivery system Gas      09/11/19 1404   09/10/19 1102  For home use only DME 4 wheeled rolling walker with seat  Once       Question:  Patient needs a walker to treat with the following condition  Answer:  Weakness   09/10/19 1102            Discharge Instructions: Please refer to Patient Instructions section of EMR for full details.  Patient was counseled important signs and symptoms that should prompt return to medical care, changes in medications, dietary instructions, activity restrictions, and follow up appointments.   Follow-Up Appointments:  Follow-up Information     Gildardo Pounds, NP Follow up on 10/17/2019.   Specialty:  Nurse Practitioner Why: If something arises earlier they will call you to reschedule. Your appointment is on October the 6th at 9:30am, please call to cancel if needed. Contact information: [place] [address] [phone]                 Freida Busman, MD 09/11/2019, 3:13 PM PGY-1, [place] Upper-Level Resident Addendum  I have independently interviewed and examined the patient. I have discussed the above with the original author and agree with their documentation. My edits for correction/addition/clarification are in italics. Please see also any attending notes.    Milus Banister, DO PGY-3, [place] [facility] 09/13/2019 12:27 PM  [place] Service pager: [phone] (text pages welcome through [facility])

## 2019-09-11 NOTE — Progress Notes (Signed)
Patient discharged home via wheelchair to car.

## 2019-09-11 NOTE — TOC Progression Note (Addendum)
Transition of Care [facility]) - Progression Note    Patient Details  Name: Sharon Spears MRN: [id number] Date of Birth: September 04, 1945  Transition of Care [facility]) CM/SW Contact  Jacalyn Lefevre Edson Snowball, RN Phone Number: 09/11/2019, 10:24 AM  Clinical Narrative:      Rolator ordered with Freda Munro with [facility]   Provided counseling resources.   Patient currently not on oxygen, requesting NCM to arrange home oxygen. She has had home oxygen in the past however she currently does not have home oxygen.   NCM explained NCM will ask nurse for ambulation oxygenation note and if needed MD order.   1350 update, patient has not received Rolator , called Freda Munro with [facility]. They are out of Rolators at hospital, patient can go to [facility] store and pick one up.   Called Brunilda Payor with [facility], he can have one delivered to her home tomorrow or next day.  Discussed above with patient. She would like [employer] to deliver one. Called [employer] back to provide patient information.   Expected Discharge Plan: Home/Self Care Barriers to Discharge: Continued Medical Work up  Expected Discharge Plan and Services Expected Discharge Plan: Home/Self Care   Discharge Planning Services: CM Consult Post Acute Care Choice: Durable Medical Equipment Living arrangements for the past 2 months: Single Family Home                 DME Arranged: Walker rolling with seat, Oxygen DME Agency: [employer] Date DME Agency Contacted: 09/10/19 Time DME Agency Contacted: 1034 Representative spoke with at DME Agency: Will call [employer] closer to discharge [place] Arranged: NA           Social Determinants of Health (SDOH) Interventions    Readmission Risk Interventions No flowsheet data found.

## 2019-09-11 NOTE — Progress Notes (Signed)
SATURATION QUALIFICATIONS: (This note is used to comply with regulatory documentation for home oxygen)  Patient Saturations on Room Air at Rest = 91%  Patient Saturations on Room Air while Ambulating = 85%  Patient Saturations on 2 Liters of oxygen while Ambulating = 91%  Please briefly explain why patient needs home oxygen: O2 sats drop with ambulation without oxygen.

## 2019-09-11 NOTE — Progress Notes (Addendum)
Discharge teaching complete. Meds, diet, activity, follow up appointments reviewed and all questions answered. Copy of instructions given to patient and prescriptions sent to pharmacy. Patient wants to wait and eat dinner before she goes home.

## 2019-09-11 NOTE — Discharge Instructions (Signed)
Dear [name],   Thank you so much for allowing [place] to be part of your care!  You were admitted to [facility] for community acquired pneumonia and likely COPD exacerbation that were treated with antibiotics and steroids. Please continue taking Cefdinir and azithromycin until you have completed the course.   Please stop taking Singulair and nitroglycerin.  POST-HOSPITAL & CARE INSTRUCTIONS 1. Please stop taking Singulair nitroglycerin. 2. Please continue taking cefdinir and azithromycin until course is complete. 3. Please let PCP/Specialists know of any changes that were made.  4. Please see medications section of this packet for any medication changes.   DOCTOR'S APPOINTMENT & FOLLOW UP CARE INSTRUCTIONS  Future Appointments  Date Time Provider [place]  09/10/2019  4:30 PM Parrett, Fonnie Mu, NP [facility] None  10/04/2019  1:00 PM Adrian Prows, MD [facility] None    Take care and be well!  [facility]  [place], [place] [address] [phone]

## 2019-09-11 NOTE — Progress Notes (Signed)
Family Medicine Teaching Service Daily Progress Note Intern Pager: [phone]  Patient name: Sharon Spears Medical record number: [id number] Date of birth: 11/07/1945 Age: 74 y.o. Gender: female  Primary Care Provider: Gildardo Pounds, NP Consultants: None Code Status: FULL  Pt Overview and Major Events to Date:  8/20-patient admitted forCAP  Assessment and Plan: CAP with superimposed COPD exacerbation Patient has been provided the option to use inhalers versus nebulizing treatments.  Patient did do treatments this a.m. lungs sound better on exam with just expiratory wheeze.  As patient has continued her home medications and antibiotics with prednisone condition is improving and patient appears stable and can be medically cleared for discharge. Patient desatted to 85% on RA with ambulation and 91% on [facility] while ambulating.  -Continue azithromycin prednisone 5/7 day -Transition ceftriaxone to cefdinir - 4/7 day  -Continue supplemental oxygen as needed to maintain O2 greater than 88% -Continuous pulse ox -Continue dextromethorphan, guaifenesin for cough suppression -Tessalon Perles 100 mg 3 times daily as needed ordered -Albuterol inhaler, budesonide, Brovana - discontinue singulair -Vitals per routine -Ambulate with pulse ox  Hx A. fib Patient home medication include Eliquis 5 mg twice daily.EKG noted rate controlled atrial fibrillation. -Continue home medication regimen  HTN Patient home medication regimen includes amlodipine 10 mg, bisoprolol 5 mg. NA 137, potassium 3.1.HR 76 ranging 57-65.Blood pressure this morning was  171/59 pulse rate is 56.  Repeat BP this a.m. 148/42>163/64. -Continue home medications -Restart bisoprolol - Start losartan 56m  T2 DM Patient home medication include metformin 500 mgtwice daily.CBG this AM 131. -Sensitive sliding scale -CBGAC and QH  Depression and anxiety Patient was very anxious this a.m. she both voiced and  appeared anxious on exam was having trouble taking her medications and was attempting to take only half of her prednisone.  Attempted to calm patient down and redirect patient during interview was overall successful and patient was able to complete her morning medication regimen.  Explained to patient that she has also been started on BuSpar for anxiety and she feels that this is absolutely necessary. -Continue home medication -Continue BuSpar 5 mg twice daily -Follow-up social work consult for therapy  HFrEF Patient home medication includes furosemide493mBNP at presentation 614.3. Last echo 07/10/2019 noted EF 55%. - continuehome lasix  PAH Patient has a history of pH and takessildenafil10 PRN.  Spoke with patient about the importance of discontinuing her nitroglycerin at home. CXR at presentation noted chronic small left pleural effusion. -Continue home medication  GERD Patient home medication regimen includes Pepcid 20 mg twice daily. -Continue home medication regimen  FEN/GI:Soft PPIOX:BDZHGDJDisposition:Home pending improvement on oxygen requirement  Subjective:  No overnight calls.  Patient very visibly anxious this a.m. and reported feeling a bit anxious while taking her medications.  She repeatedly says "I have a lot going on."  Patient reports that her heart rate got into the high 50s and ranged up to 62 bpm and is very preoccupied with heart rate.  She reports that when her heart feels fine she did not feel short of breath but when her heart does not feel fine she is not short of breath.  Objective: Temp:  [98 F (36.7 C)-98.7 F (37.1 C)] 98.6 F (37 C) (08/31 0605) Pulse Rate:  [48-78] 56 (08/31 0605) Resp:  [16-32] 17 (08/31 0605) BP: (127-171)/(46-71) 171/59 (08/31 0605) SpO2:  [92 %-98 %] 97 % (08/31 062426Physical Exam: General: Very anxious, rapid speech, preoccupied with heart rate, redirectable, Cardiovascular: Did not appear  to be in A. fib  this a.m., bradycardic, no murmur detected Respiratory: Expiratory wheeze in bilateral lower lobes significantly improved from yesterday, good chest expansion Abdomen: Normoactive bowel sounds, no pain to palpation   Laboratory: Recent Labs  Lab 09/09/19 0532 09/10/19 1522 09/11/19 0414  WBC 5.6 12.8* 10.7*  HGB 12.1 11.8* 11.6*  HCT 39.0 37.4 37.7  PLT 169 176 187   Recent Labs  Lab 09/08/19 0534 09/08/19 1200 09/09/19 0532 09/11/19 0414  NA 137  --  139 136  K 3.5  --  4.3 4.0  CL 101  --  101 98  CO2 24  --  25 28  BUN 22  --  21 22  CREATININE 0.77  --  0.97 0.93  CALCIUM 9.0  --  9.0 9.0  PROT  --  7.6 7.3 7.0  BILITOT  --  0.7 0.9 1.0  ALKPHOS  --  71 64 62  ALT  --  46* 49* 40  AST  --  42* 37 27  GLUCOSE 149*  --  216* 172*      Imaging/Diagnostic Tests: None new  Freida Busman, MD 09/11/2019, 7:29 AM PGY-1, [place] Intern pager: [phone], text pages welcome

## 2019-09-11 NOTE — Progress Notes (Signed)
Inpatient Diabetes Program Recommendations  AACE/ADA: New Consensus Statement on Inpatient Glycemic Control (2015)  Target Ranges:  Prepandial:   less than 140 mg/dL      Peak postprandial:   less than 180 mg/dL (1-2 hours)      Critically ill patients:  140 - 180 mg/dL   Lab Results  Component Value Date   GLUCAP 185 (H) 09/11/2019   HGBA1C 7.6 (H) 09/09/2019    Review of Glycemic Control Results for ARRYANA, TOLLESON (MRN [id number]) as of 09/11/2019 13:16  Ref. Range 09/10/2019 06:01 09/10/2019 11:57 09/10/2019 16:37 09/11/2019 06:04 09/11/2019 11:22  Glucose-Capillary Latest Ref Range: 70 - 99 mg/dL 109 (H) 219 (H) 243 (H) 131 (H) 185 (H)   Diabetes history: DM 2 Outpatient Diabetes medications:  Metformin 500 mg bid Current orders for Inpatient glycemic control:  Novolog sensitive tid with meals and HS Metformin 500 mg daily with breakfast Prednisone 40 mg q AM  Inpatient Diabetes Program Recommendations:    While on Prednisone, consider increasing Novolog correction to moderate tid with meals and HS.   Thanks  Adah Perl, RN, BC-ADM Inpatient Diabetes Coordinator Pager [phone] (8a-5p)

## 2019-09-12 ENCOUNTER — Telehealth: Payer: Self-pay

## 2019-09-12 NOTE — Telephone Encounter (Signed)
Transition Care Management Follow-up Telephone Call  Date of discharge and from where: 09/11/2019, [facility]   How have you been since you were released from the hospital? She said she is doing okay   Any questions or concerns?  only question was regarding her home aide  - noted below  Items Reviewed:  Did the pt receive and understand the discharge instructions provided? yes  Medications obtained and verified?  she said she has all medications and did not have any questions about the med regime or need to review the list at this time  Any new allergies since your discharge?  none reported   Do you have support at home?  no, lives alone.  She said that her SW at [facility] set up a home aide for her through an agency called " [employer]"  She said that she has the phone number for the agency.  Instructed her to call the agency and inquire about the status of the referral.   Has O2, using it at 2L  Has nebulizer and glucometer.   Functional Questionnaire: (I = Independent and D = Dependent) ADLs: independent. Has rollator for ambulation   Follow up appointments reviewed:   PCP Hospital f/u appt confirmed?.Dr Chapman Fitch 09/21/2019  [facility] f/u appt confirmed? Cardiology - 10/04/2019  Are transportation arrangements needed?  she needs to call [facility] and/or medicaid to arrange transportation.   If their condition worsens, is the pt aware to call PCP or go to the Emergency Dept.?  yes  Was the patient provided with contact information for the PCP's office or ED?  she has the clinic number in her phone  Was to pt encouraged to call back with questions or concerns? yes

## 2019-09-13 ENCOUNTER — Telehealth: Payer: Self-pay | Admitting: Nurse Practitioner

## 2019-09-13 NOTE — Telephone Encounter (Signed)
NEEDS Strep test.

## 2019-09-13 NOTE — Telephone Encounter (Signed)
Attempted to reach patient to inform we do not do swab for strep test here. Pt would need to go to Urgent care.  No answer and lvm.

## 2019-09-13 NOTE — Telephone Encounter (Signed)
Copied from [place] [id number]. Topic: General - Other >> Sep 07, 2019  1:09 PM Keene Breath wrote: Reason for CRM: Patient called to inform the nurse and doctor that she still has fever and sore throat even though she has finished the antibiotic that was prescribed.  Would like to know what she should do.  CB# [phone]

## 2019-09-18 ENCOUNTER — Telehealth: Payer: Self-pay | Admitting: Cardiology

## 2019-09-19 NOTE — Telephone Encounter (Signed)
Spoke to patient and informed on PCP advising. Pt. Stated she is not going back to Emergency department or urgent care.  Pt. Have an upcoming appt. Friday w/ a provider for a HFU.

## 2019-09-21 ENCOUNTER — Other Ambulatory Visit: Payer: Self-pay

## 2019-09-21 ENCOUNTER — Encounter: Payer: Self-pay | Admitting: Family Medicine

## 2019-09-21 ENCOUNTER — Ambulatory Visit: Payer: Medicare Other | Attending: Family Medicine | Admitting: Family Medicine

## 2019-09-21 VITALS — BP 173/71 | HR 53 | Ht 63.0 in | Wt 146.2 lb

## 2019-09-21 DIAGNOSIS — J411 Mucopurulent chronic bronchitis: Secondary | ICD-10-CM

## 2019-09-21 DIAGNOSIS — F411 Generalized anxiety disorder: Secondary | ICD-10-CM

## 2019-09-21 DIAGNOSIS — I272 Pulmonary hypertension, unspecified: Secondary | ICD-10-CM

## 2019-09-21 DIAGNOSIS — Z09 Encounter for follow-up examination after completed treatment for conditions other than malignant neoplasm: Secondary | ICD-10-CM | POA: Diagnosis not present

## 2019-09-21 DIAGNOSIS — I10 Essential (primary) hypertension: Secondary | ICD-10-CM | POA: Diagnosis not present

## 2019-09-21 DIAGNOSIS — I25118 Atherosclerotic heart disease of native coronary artery with other forms of angina pectoris: Secondary | ICD-10-CM

## 2019-09-21 DIAGNOSIS — J4540 Moderate persistent asthma, uncomplicated: Secondary | ICD-10-CM

## 2019-09-21 DIAGNOSIS — E7800 Pure hypercholesterolemia, unspecified: Secondary | ICD-10-CM

## 2019-09-21 DIAGNOSIS — I4821 Permanent atrial fibrillation: Secondary | ICD-10-CM

## 2019-09-21 DIAGNOSIS — J441 Chronic obstructive pulmonary disease with (acute) exacerbation: Secondary | ICD-10-CM

## 2019-09-21 DIAGNOSIS — E1169 Type 2 diabetes mellitus with other specified complication: Secondary | ICD-10-CM

## 2019-09-21 DIAGNOSIS — I2723 Pulmonary hypertension due to lung diseases and hypoxia: Secondary | ICD-10-CM

## 2019-09-21 DIAGNOSIS — K219 Gastro-esophageal reflux disease without esophagitis: Secondary | ICD-10-CM

## 2019-09-21 DIAGNOSIS — I495 Sick sinus syndrome: Secondary | ICD-10-CM

## 2019-09-21 DIAGNOSIS — Z7901 Long term (current) use of anticoagulants: Secondary | ICD-10-CM

## 2019-09-21 MED ORDER — ATORVASTATIN CALCIUM 40 MG PO TABS: 40.0000 mg | ORAL_TABLET | Freq: Every day | ORAL | 3 refills | Status: DC

## 2019-09-21 MED ORDER — DOXYCYCLINE HYCLATE 100 MG PO TABS: 100.0000 mg | ORAL_TABLET | Freq: Two times a day (BID) | ORAL | 0 refills | Status: DC

## 2019-09-21 MED ORDER — ALBUTEROL SULFATE HFA 108 (90 BASE) MCG/ACT IN AERS: INHALATION_SPRAY | RESPIRATORY_TRACT | 3 refills | Status: AC

## 2019-09-21 MED ORDER — BISOPROLOL FUMARATE 10 MG PO TABS: 5.0000 mg | ORAL_TABLET | Freq: Every day | ORAL | 3 refills | Status: DC

## 2019-09-21 MED ORDER — AMLODIPINE BESYLATE 10 MG PO TABS: 10.0000 mg | ORAL_TABLET | Freq: Every day | ORAL | 4 refills | Status: DC

## 2019-09-21 MED ORDER — BUDESONIDE 0.25 MG/2ML IN SUSP: 0.2500 mg | Freq: Two times a day (BID) | RESPIRATORY_TRACT | 12 refills | Status: AC

## 2019-09-21 MED ORDER — SILDENAFIL CITRATE 20 MG PO TABS: 20.0000 mg | ORAL_TABLET | Freq: Three times a day (TID) | ORAL | 6 refills | Status: AC

## 2019-09-21 MED ORDER — ARFORMOTEROL TARTRATE 15 MCG/2ML IN NEBU: 15.0000 ug | INHALATION_SOLUTION | Freq: Two times a day (BID) | RESPIRATORY_TRACT | 6 refills | Status: DC

## 2019-09-21 MED ORDER — METFORMIN HCL 500 MG PO TABS: 500.0000 mg | ORAL_TABLET | Freq: Two times a day (BID) | ORAL | 4 refills | Status: AC

## 2019-09-21 MED ORDER — DOXYCYCLINE HYCLATE 100 MG PO TABS: 100.0000 mg | ORAL_TABLET | Freq: Two times a day (BID) | ORAL | 0 refills | Status: AC

## 2019-09-21 MED ORDER — BUSPIRONE HCL 5 MG PO TABS: 5.0000 mg | ORAL_TABLET | Freq: Three times a day (TID) | ORAL | 0 refills | Status: DC

## 2019-09-21 MED ORDER — BUDESONIDE 0.25 MG/2ML IN SUSP: 0.2500 mg | Freq: Two times a day (BID) | RESPIRATORY_TRACT | 12 refills | Status: DC

## 2019-09-21 MED ORDER — FAMOTIDINE 20 MG PO TABS: 20.0000 mg | ORAL_TABLET | Freq: Two times a day (BID) | ORAL | 4 refills | Status: DC

## 2019-09-21 MED ORDER — ESCITALOPRAM OXALATE 10 MG PO TABS: 10.0000 mg | ORAL_TABLET | Freq: Every day | ORAL | 3 refills | Status: DC

## 2019-09-21 MED ORDER — APIXABAN 5 MG PO TABS: 5.0000 mg | ORAL_TABLET | Freq: Two times a day (BID) | ORAL | 3 refills | Status: DC

## 2019-09-21 MED ORDER — PREDNISONE 20 MG PO TABS: ORAL_TABLET | ORAL | 0 refills | Status: DC

## 2019-09-21 MED ORDER — ATORVASTATIN CALCIUM 40 MG PO TABS: 40.0000 mg | ORAL_TABLET | Freq: Every day | ORAL | 3 refills | Status: AC

## 2019-09-21 MED ORDER — FUROSEMIDE 20 MG PO TABS: 20.0000 mg | ORAL_TABLET | Freq: Two times a day (BID) | ORAL | Status: DC

## 2019-09-21 MED ORDER — LOSARTAN POTASSIUM 50 MG PO TABS: 50.0000 mg | ORAL_TABLET | Freq: Every day | ORAL | 0 refills | Status: DC

## 2019-09-21 MED ORDER — APIXABAN 5 MG PO TABS: 5.0000 mg | ORAL_TABLET | Freq: Two times a day (BID) | ORAL | 3 refills | Status: AC

## 2019-09-21 MED ORDER — FAMOTIDINE 20 MG PO TABS: 20.0000 mg | ORAL_TABLET | Freq: Two times a day (BID) | ORAL | 4 refills | Status: AC

## 2019-09-21 MED ORDER — SILDENAFIL CITRATE 20 MG PO TABS: 20.0000 mg | ORAL_TABLET | Freq: Three times a day (TID) | ORAL | 6 refills | Status: DC

## 2019-09-21 MED ORDER — METFORMIN HCL 500 MG PO TABS: 500.0000 mg | ORAL_TABLET | Freq: Two times a day (BID) | ORAL | 4 refills | Status: DC

## 2019-09-21 MED ORDER — ARFORMOTEROL TARTRATE 15 MCG/2ML IN NEBU: 15.0000 ug | INHALATION_SOLUTION | Freq: Two times a day (BID) | RESPIRATORY_TRACT | 6 refills | Status: AC

## 2019-09-21 MED ORDER — ALBUTEROL SULFATE HFA 108 (90 BASE) MCG/ACT IN AERS: INHALATION_SPRAY | RESPIRATORY_TRACT | 3 refills | Status: DC

## 2019-09-21 NOTE — Progress Notes (Signed)
Established Patient Office Visit  Subjective:  Patient ID: Sharon Spears, female    DOB: 1945/09/27  Age: 74 y.o. MRN: [id number]  CC:  Chief Complaint  Patient presents with  . Hospitalization Follow-up  Needs new RX for all of her medications as well  HPI Sharon Spears, 74 year old female,  who is status post recent hospitalization at [facility] due to acute on chronic hypoxic respiratory failure due to COPD exacerbation and community-acquired pneumonia.  She additionally has ongoing medical issues including hypertension, type 2 diabetes,, GERD atrial fibrillation with long-term anticoagulant use, moderate persistent asthma, pulmonary hypertension, tachy-bradycardia syndrome, and generalized anxiety disorder.  She reports that she needs refills of all medications.  She reports continued fatigue as well as continued chest congestion, cough and shortness of breath.  She has finished antibiotics prescribed at the time of hospital discharge.  She feels as if she has mucus that gets stuck in the center of her chest/throat which causes her to have recurrent cough that she cannot bring anything up.  She continues to take generic Mucinex/generic Robitussin-DM to help with the chest congestion and cough.          She needs refills of all of her chronic medications.  Blood pressure is elevated today but on review of medications, patient has not been taking her amlodipine.  She does report compliance with all other medications.  She believes that all of her other chronic medical conditions are stable.  She reports no relief chest pain since her hospitalization.  She has had no recent fever or chills.  She denies any increase in lower extremity swelling.  She denies urinary frequency, urgency or dysuria.  She has had no unusual bruising or bleeding related to her long-term use of anticoagulant medication.          Past Medical History:  Diagnosis Date  . Adjustment disorder with anxiety   .  Asthma   . Atrial fibrillation ([place])   . Benign essential hypertension   . COPD (chronic obstructive pulmonary disease) ([place])   . Coronary artery disease   . Diabetic neuropathic arthritis ([place])   . Heart palpitations   . Hypoxia 07/19/2018  . Other cirrhosis of liver ([place]) 07/20/2018  . Uncontrolled diabetes mellitus type 2 without complications     Past Surgical History:  Procedure Laterality Date  . CARDIAC SURGERY    . CESAREAN SECTION    . CORONARY ARTERY BYPASS GRAFT  07/06/2017   Redo-sternotomy and CABG 2 with LIMA to LAD, SVG to OM, and a a closure, mitral valve repair with #30 mm [place] ring on 07/06/2017  . ESOPHAGEAL MANOMETRY N/A 03/06/2012   Procedure: ESOPHAGEAL MANOMETRY (EM);  Surgeon: Beryle Beams, MD;  Location: [facility];  Service: Endoscopy;  Laterality: N/A;  . IR ANGIOGRAM SELECTIVE EACH ADDITIONAL VESSEL  02/16/2019  . IR ANGIOGRAM SELECTIVE EACH ADDITIONAL VESSEL  02/16/2019  . IR ANGIOGRAM SELECTIVE EACH ADDITIONAL VESSEL  02/16/2019  . IR ANGIOGRAM SELECTIVE EACH ADDITIONAL VESSEL  02/16/2019  . IR RENAL SELECTIVE  UNI INC S&I MOD SED  02/16/2019  . IR US GUIDE VASC ACCESS RIGHT  02/16/2019  . RIGHT/LEFT HEART CATH AND CORONARY/GRAFT ANGIOGRAPHY N/A 08/07/2019   Procedure: RIGHT/LEFT HEART CATH AND CORONARY/GRAFT ANGIOGRAPHY;  Surgeon: Adrian Prows, MD;  Location: [facility];  Service: Cardiovascular;  Laterality: N/A;  . TONSILLECTOMY      Family History  Problem Relation Age of Onset  . Asthma Mother   .  Alcohol abuse Father     Social History   Socioeconomic History  . Marital status: Legally Separated    Spouse name: Not on file  . Number of children: 4  . Years of education: 59  . Highest education level: Not on file  Occupational History  . Occupation: Retired  Tobacco Use  . Smoking status: Never Smoker  . Smokeless tobacco: Never Used  Vaping Use  . Vaping Use: Never used  Substance and Sexual Activity  . Alcohol use: No  . Drug  use: Never  . Sexual activity: Yes  Other Topics Concern  . Not on file  Social History Narrative   Fun/Hobby: Gardening    Denies abuse and feels safe at home.    1 son deceased   Social Determinants of Health   Financial Resource Strain:   . Difficulty of Paying Living Expenses: Not on file  Food Insecurity:   . Worried About Charity fundraiser in the Last Year: Not on file  . Ran Out of Food in the Last Year: Not on file  Transportation Needs:   . Lack of Transportation (Medical): Not on file  . Lack of Transportation (Non-Medical): Not on file  Physical Activity:   . Days of Exercise per Week: Not on file  . Minutes of Exercise per Session: Not on file  Stress:   . Feeling of Stress : Not on file  Social Connections:   . Frequency of Communication with Friends and Family: Not on file  . Frequency of Social Gatherings with Friends and Family: Not on file  . Attends Religious Services: Not on file  . Active Member of Clubs or Organizations: Not on file  . Attends Archivist Meetings: Not on file  . Marital Status: Not on file  Intimate Partner Violence:   . Fear of Current or Ex-Partner: Not on file  . Emotionally Abused: Not on file  . Physically Abused: Not on file  . Sexually Abused: Not on file    Outpatient Medications Prior to Visit  Medication Sig Dispense Refill  . acetaminophen (TYLENOL) 500 MG tablet Take 500 mg by mouth at bedtime as needed for headache (pain).    . Blood Glucose Monitoring Suppl (ACCU-CHEK AVIVA PLUS) w/Device KIT USE TO CHECK BLOOD SUGARS ONCE DAILY    . dextromethorphan-guaiFENesin (MUCINEX DM) 30-600 MG 12hr tablet Take 1 tablet by mouth 2 (two) times daily. 20 tablet 0  . glucose blood (ACCU-CHEK AVIVA PLUS) test strip USE WITH GLUCOMETER TO CHECK BLOOD SUGARS ONCE DAILY    . macitentan (OPSUMIT) 10 MG tablet Take 1 tablet (10 mg total) by mouth daily. 30 tablet 6  . nystatin (MYCOSTATIN) 100000 UNIT/ML suspension Take 5 mLs  (500,000 Units total) by mouth 4 (four) times daily. 60 mL 0  . predniSONE (DELTASONE) 20 MG tablet Take 2 tablets (40 mg total) by mouth daily with breakfast. 2 tablet 0  . albuterol (VENTOLIN HFA) 108 (90 Base) MCG/ACT inhaler INHALE 2 PUFFS INTO THE LUNGS EVERY 6 HOURS AS NEEDED FOR WHEEZE (Patient taking differently: Inhale 2 puffs into the lungs every 6 (six) hours as needed for wheezing or shortness of breath. ) 18 g 3  . apixaban (ELIQUIS) 5 MG TABS tablet Take 1 tablet (5 mg total) by mouth 2 (two) times daily. 60 tablet 3  . arformoterol (BROVANA) 15 MCG/2ML NEBU Take 2 mLs (15 mcg total) by nebulization 2 (two) times daily. 120 mL 6  . bisoprolol (ZEBETA)  10 MG tablet Take 0.5 tablets (5 mg total) by mouth daily. 30 tablet 3  . budesonide (PULMICORT) 0.25 MG/2ML nebulizer solution Take 2 mLs (0.25 mg total) by nebulization 2 (two) times daily. 120 mL 12  . busPIRone (BUSPAR) 5 MG tablet Take 1 tablet (5 mg total) by mouth 3 (three) times daily. 90 tablet 0  . escitalopram (LEXAPRO) 10 MG tablet Take 10 mg by mouth daily.    . famotidine (PEPCID) 20 MG tablet Take 20 mg by mouth daily as needed (acid reflux).     . furosemide (LASIX) 20 MG tablet Take 1 tablet (20 mg total) by mouth 2 (two) times daily. (Patient taking differently: Take 40 mg by mouth daily. )    . losartan (COZAAR) 50 MG tablet Take 1 tablet (50 mg total) by mouth daily. 30 tablet 0  . metFORMIN (GLUCOPHAGE) 500 MG tablet Take 500 mg by mouth 2 (two) times daily with a meal.     . sildenafil (REVATIO) 20 MG tablet Take 1 tablet (20 mg total) by mouth 3 (three) times daily. (Patient taking differently: Take 10 mg by mouth 2 (two) times daily. ) 90 tablet 6  . amLODipine (NORVASC) 10 MG tablet Take 10 mg by mouth daily. (Patient not taking: Reported on 09/08/2019)    . atorvastatin (LIPITOR) 40 MG tablet Take 1 tablet (40 mg total) by mouth daily. (Patient not taking: Reported on 09/08/2019) 90 tablet 3   No  facility-administered medications prior to visit.    Allergies  Allergen Reactions  . Contrast Media [Iodinated Diagnostic Agents] Anaphylaxis    Per pt she had anaphylaxis reaction to contrast media in the past. States she couldn't breathe and they had to give her epi.   . Adhesive [Tape] Itching  . Penicillins Other (See Comments)    GI upset - reaction to capsules - not injection Has patient had a PCN reaction causing immediate rash, facial/tongue/throat swelling, SOB or lightheadedness with hypotension: No Has patient had a PCN reaction causing severe rash involving mucus membranes or skin necrosis: No Has patient had a PCN reaction that required hospitalization: No Has patient had a PCN reaction occurring within the last 10 years: No If all of the above answers are "NO", then may proceed with Cephalosporin use.    ROS Review of Systems  Constitutional: Positive for fatigue. Negative for chills and fever.  HENT: Positive for sore throat (throat feels irritated). Negative for trouble swallowing.   Respiratory: Positive for cough, chest tightness and shortness of breath.   Cardiovascular: Negative for chest pain, palpitations and leg swelling.  Gastrointestinal: Positive for nausea (nausea with recurrent coughing). Negative for abdominal pain, constipation and diarrhea.  Endocrine: Negative for polydipsia, polyphagia and polyuria.  Genitourinary: Negative for dysuria and frequency.  Musculoskeletal: Positive for gait problem. Negative for arthralgias and back pain.  Neurological: Positive for dizziness and headaches. Negative for weakness.       Reports chronic issues with headaches and dizziness  Hematological: Negative for adenopathy. Does not bruise/bleed easily.  Psychiatric/Behavioral: Negative for self-injury. The patient is nervous/anxious.       Objective:    Physical Exam Vitals and nursing note reviewed.  Constitutional:      General: She is not in acute distress.     Appearance: Normal appearance. She is ill-appearing.     Comments: WNWD elderly female in NAD but needed assistance getting onto exam table; appears fatigued  Cardiovascular:     Rate and Sharon: Regular Sharon.  Bradycardia present.  Pulmonary:     Breath sounds: Wheezing and rhonchi present.     Comments: Mild increased work of breathing with ambulation but not at rest; wheezing and mild rhonchi in all lung fields with some decrease in air movement Abdominal:     Palpations: Abdomen is soft.     Tenderness: There is no abdominal tenderness. There is no right CVA tenderness, left CVA tenderness, guarding or rebound.  Musculoskeletal:        General: Deformity present.     Cervical back: No tenderness.     Right lower leg: No edema.     Left lower leg: No edema.     Comments: Mild kyphosis of mid to upper back  Lymphadenopathy:     Cervical: No cervical adenopathy.  Skin:    General: Skin is warm and dry.  Neurological:     General: No focal deficit present.     Mental Status: She is alert and oriented to person, place, and time.  Psychiatric:        Mood and Affect: Mood normal.        Behavior: Behavior normal.     Comments: Slightly flattened affect, appears fatigued     BP (!) 173/71 (BP Location: Left Arm, Patient Position: Sitting, Cuff Size: Normal)   Pulse (!) 53   Ht _0  (1.6 m)   Wt 146 lb 3.2 oz (66.3 kg)   SpO2 98%   BMI 25.90 kg/m  Wt Readings from Last 3 Encounters:  09/21/19 146 lb 3.2 oz (66.3 kg)  09/10/19 151 lb 10.8 oz (68.8 kg)  09/03/19 151 lb (68.5 kg)     Health Maintenance Due  Topic Date Due  . FOOT EXAM  Never done  . TETANUS/TDAP  Never done  . COLONOSCOPY  Never done  . DEXA SCAN  Never done  . MAMMOGRAM  05/30/2014  . OPHTHALMOLOGY EXAM  06/11/2016  . INFLUENZA VACCINE  08/12/2019     Lab Results  Component Value Date   TSH 1.44 07/30/2019   Lab Results  Component Value Date   WBC 10.7 (H) 09/11/2019   HGB 11.6 (L)  09/11/2019   HCT 37.7 09/11/2019   MCV 81.1 09/11/2019   PLT 187 09/11/2019   Lab Results  Component Value Date   NA 136 09/11/2019   K 4.0 09/11/2019   CO2 28 09/11/2019   GLUCOSE 172 (H) 09/11/2019   BUN 22 09/11/2019   CREATININE 0.93 09/11/2019   BILITOT 1.0 09/11/2019   ALKPHOS 62 09/11/2019   AST 27 09/11/2019   ALT 40 09/11/2019   PROT 7.0 09/11/2019   ALBUMIN 3.2 (L) 09/11/2019   CALCIUM 9.0 09/11/2019   ANIONGAP 10 09/11/2019   GFR 85.17 06/02/2016   Lab Results  Component Value Date   CHOL 134 07/30/2019   Lab Results  Component Value Date   HDL 41 (L) 07/30/2019   Lab Results  Component Value Date   LDLCALC 73 07/30/2019   Lab Results  Component Value Date   TRIG 110 07/30/2019   Lab Results  Component Value Date   CHOLHDL 3.3 07/30/2019   Lab Results  Component Value Date   HGBA1C 7.6 (H) 09/09/2019      Assessment & Plan:  1. Hospital discharge follow-up Patient is status post hospitalization due to community-acquired pneumonia affecting her right middle lobe, acute on chronic respiratory failure, and COPD exacerbation.  She reports that he does not currently have a pulmonologist therefore  referral is being made for patient to follow-up with pulmonology.  She has also been asked to obtain chest x-ray next week to make sure that there is clearing of her pneumonia as she continues to complain of a productive cough, shortness of breath and sensation of chest congestion.  She is to continue her use of over-the-counter Mucinex and is being placed on doxycycline and prednisone taper for COPD exacerbation. - DG Chest 2 View; Future - Ambulatory referral to Pulmonology  2. COPD exacerbation ([place]) She reports that she continues to have issues with mucopurulent cough, sensation of chest congestion, no shortness of breath.  She is status post recent hospitalization for community-acquired pneumonia.  She has been asked to obtain a repeat chest x-ray next week.   She is aware that if she has worsening of her respiratory symptoms that she should return to the emergency department for further evaluation.  Prescription provided for doxycycline and prednisone taper.  Continue home respiratory medications. - DG Chest 2 View; Future - doxycycline (VIBRA-TABS) 100 MG tablet; Take 1 tablet (100 mg total) by mouth 2 (two) times daily.  Dispense: 20 tablet; Refill: 0 - predniSONE (DELTASONE) 20 MG tablet; 3 pills today then 2 pills daily x 2 days then 1 pill daily for 4 days then resume home dose  Dispense: 9 tablet; Refill: 0 - Ambulatory referral to Pulmonology  3. Mucopurulent chronic bronchitis ([place]) Patient with COPD with chronic bronchitis issues and she reports continued shortness of breath, cough and chest congestion.  Prescriptions provided for doxycycline and prednisone taper.  She was treated in the hospital with azithromycin and ceftriaxone. - DG Chest 2 View; Future - doxycycline (VIBRA-TABS) 100 MG tablet; Take 1 tablet (100 mg total) by mouth 2 (two) times daily.  Dispense: 20 tablet; Refill: 0 - predniSONE (DELTASONE) 20 MG tablet; 3 pills today then 2 pills daily x 2 days then 1 pill daily for 4 days then resume home dose  Dispense: 9 tablet; Refill: 0 - Ambulatory referral to Pulmonology  4. Essential hypertension Prescriptions provided patient's blood pressure medications including her losartan, bisoprolol, Lasix and amlodipine.  Patient had not restarted her amlodipine status post hospitalization as she thought that this medicine was contributing to her slow heart rate. - furosemide (LASIX) 20 MG tablet; Take 1 tablet (20 mg total) by mouth 2 (two) times daily.  Dispense: 30 tablet - losartan (COZAAR) 50 MG tablet; Take 1 tablet (50 mg total) by mouth daily.  Dispense: 30 tablet; Refill: 0  5. World Health Organization group 3 pulmonary arterial hypertension ([place]) 6. Pulmonary hypertension, unspecified (HCC) Refills provided patient sildenafil  for continued treatment of pulmonary hypertension and referral placed for patient to follow-up with pulmonology - sildenafil (REVATIO) 20 MG tablet; Take 1 tablet (20 mg total) by mouth 3 (three) times daily.  Dispense: 90 tablet; Refill: 6 - Ambulatory referral to Pulmonology  7. Moderate persistent asthma, unspecified whether complicated Refills provided of patient's Brovana and Pulmicort.  Referral made to pulmonology and follow-up of COPD/asthma/ pulmonary hypertension as well as recent hospitalization - arformoterol (BROVANA) 15 MCG/2ML NEBU; Take 2 mLs (15 mcg total) by nebulization 2 (two) times daily.  Dispense: 120 mL; Refill: 6 - budesonide (PULMICORT) 0.25 MG/2ML nebulizer solution; Take 2 mLs (0.25 mg total) by nebulization 2 (two) times daily.  Dispense: 120 mL; Refill: 12 - Ambulatory referral to Pulmonology  8. Tachycardia-bradycardia syndrome ([place]) Continue use of bisoprolol and cardiology follow-up - bisoprolol (ZEBETA) 10 MG tablet; Take 0.5 tablets (  5 mg total) by mouth daily.  Dispense: 30 tablet; Refill: 3  9. Coronary artery disease of native artery of native heart with stable angina pectoris [facility]) Patient had issues with chest pain during recent hospitalization had negative cardiac work-up and chest pain was thought to be secondary to acid reflux.  Refills provided of patient's atorvastatin and bisoprolol at today's visit. - atorvastatin (LIPITOR) 40 MG tablet; Take 1 tablet (40 mg total) by mouth daily.  Dispense: 90 tablet; Refill: 3 - bisoprolol (ZEBETA) 10 MG tablet; Take 0.5 tablets (5 mg total) by mouth daily.  Dispense: 30 tablet; Refill: 3  10. Hypercholesteremia Refill provided of atorvastatin for continued treatment of hyperlipidemia and to reduce the CAD risk associated with patient being diabetic. - atorvastatin (LIPITOR) 40 MG tablet; Take 1 tablet (40 mg total) by mouth daily.  Dispense: 90 tablet; Refill: 3  11. Permanent atrial fibrillation ([place]).   CHA2DS2-VASc Score is 4.  Yearly risk of stroke: 4% (A,F, HTN, Vasc Dz).  She appears to be in normal sinus Sharon at today's visit.  Refill provided for Eliquis to help prevent risk of stroke associated with atrial fibrillation - apixaban (ELIQUIS) 5 MG TABS tablet; Take 1 tablet (5 mg total) by mouth 2 (two) times daily.  Dispense: 60 tablet; Refill: 3  12. GAD (generalized anxiety disorder) Patient with generalized anxiety disorder and depression.  Patient's Singulair was discontinued during hospitalization as this medication can worsen depression.  Refills provided of patient's BuSpar and Lexapro and she is to follow-up with her primary care provider and/or mental health provider. - busPIRone (BUSPAR) 5 MG tablet; Take 1 tablet (5 mg total) by mouth 3 (three) times daily.  Dispense: 90 tablet; Refill: 0 - escitalopram (LEXAPRO) 10 MG tablet; Take 1 tablet (10 mg total) by mouth daily.  Dispense: 30 tablet; Refill: 3  13. Type 2 diabetes mellitus with other specified complication, without long-term current use of insulin (HCC) Refill provided of patient's Metformin for continued treatment of type 2 diabetes.  Blood sugars will likely be elevated due to treatment with prednisone during recent hospitalization as well as new prescription for prednisone given at today's visit.  She is to make sure that she is compliant with medications, monitoring of blood sugars and increased water intake.  14. Long term current use of anticoagulant; 15. GERD Refill provided for Eliquis which patient takes to reduce risk of stroke associated with atrial fibrillation.  Refill provided for pantoprazole for GERD and stomach protection due to use of anticoagulant medication. - apixaban (ELIQUIS) 5 MG TABS tablet; Take 1 tablet (5 mg total) by mouth 2 (two) times daily.  Dispense: 60 tablet; Refill: 3     Follow-up: Return for COPD- in 1-2 weeks with Joya Gaskins or PCP.    Antony Blackbird, MD

## 2019-09-21 NOTE — Progress Notes (Signed)
Needs all medications sent to [facility].

## 2019-09-24 MED ORDER — FUROSEMIDE 20 MG PO TABS: 20.0000 mg | ORAL_TABLET | Freq: Two times a day (BID) | ORAL | 3 refills | Status: AC

## 2019-09-25 ENCOUNTER — Other Ambulatory Visit: Payer: Self-pay | Admitting: Cardiology

## 2019-09-25 DIAGNOSIS — I10 Essential (primary) hypertension: Secondary | ICD-10-CM

## 2019-09-25 DIAGNOSIS — I495 Sick sinus syndrome: Secondary | ICD-10-CM

## 2019-09-25 DIAGNOSIS — I25118 Atherosclerotic heart disease of native coronary artery with other forms of angina pectoris: Secondary | ICD-10-CM

## 2019-09-28 ENCOUNTER — Ambulatory Visit (HOSPITAL_COMMUNITY)
Admission: EM | Admit: 2019-09-28 | Discharge: 2019-09-28 | Disposition: A | Discharge status: Home / Self Care | Payer: Medicare Other | Attending: Emergency Medicine | Admitting: Emergency Medicine

## 2019-09-28 ENCOUNTER — Other Ambulatory Visit: Payer: Self-pay

## 2019-09-28 ENCOUNTER — Encounter (HOSPITAL_COMMUNITY): Payer: Self-pay

## 2019-09-28 DIAGNOSIS — B356 Tinea cruris: Secondary | ICD-10-CM | POA: Diagnosis not present

## 2019-09-28 MED ORDER — FLUCONAZOLE 150 MG PO TABS: ORAL_TABLET | ORAL | 0 refills | Status: DC

## 2019-09-28 MED ORDER — CLOTRIMAZOLE 1 % EX CREA: 1.0000 "application " | TOPICAL_CREAM | Freq: Two times a day (BID) | CUTANEOUS | 0 refills | Status: AC

## 2019-09-28 NOTE — Discharge Instructions (Signed)
Keep the area dry best as you can.  No longer need to apply hydrogen peroxide. Use of the provided cream twice a day as needed.  Take one pill today and another in three days.  Follow up with your primary care provider if symptoms persist.

## 2019-09-28 NOTE — ED Triage Notes (Signed)
Pt presents with rash in the genital area x 3 weeks. States the rash is cause by the diapers she wears at nighttime. Denies dysuria, fever, chills, vaginal discharge.

## 2019-09-28 NOTE — ED Provider Notes (Signed)
[place]    CSN: [id number] Arrival date & time: 09/28/19  1134      History   Chief Complaint Chief Complaint  Patient presents with   Rash    HPI Sharon Spears is a 74 y.o. female.   Sharon Spears presents with complaints of rash to pubic region, which she feels is related to wearing pampers while hospitalized recently. She still wears them due to some mild stress incontinence over night. Rash is itchy. Denies any previous similar. Has been applying hydrogen peroxide which hasn't helped, as well as neosporin.    ROS per HPI, negative if not otherwise mentioned.      Past Medical History:  Diagnosis Date   Adjustment disorder with anxiety    Asthma    Atrial fibrillation (HCC)    Benign essential hypertension    COPD (chronic obstructive pulmonary disease) (HCC)    Coronary artery disease    Diabetic neuropathic arthritis ([place])    Heart palpitations    Hypoxia 07/19/2018   Other cirrhosis of liver ([place]) 07/20/2018   Uncontrolled diabetes mellitus type 2 without complications     Patient Active Problem List   Diagnosis Date Noted   Dyspnea 09/09/2019   CAP (community acquired pneumonia) 09/08/2019   Severe persistent asthma 02/14/2019   Medication monitoring encounter 02/14/2019   Episode of recurrent major depressive disorder ([place]) 01/24/2019   Coronary artery disease of native artery of native heart with stable angina pectoris ([place]) 01/24/2019   Thyroid mass 12/01/2018   Renal mass 12/01/2018   Complex care coordination 12/01/2018   Other cirrhosis of liver ([place]) 07/20/2018   Unspecified atrial fibrillation ([place]) 07/20/2018   Cough 02/08/2018   Dyspnea on exertion 35/57/3220   Chronic systolic heart failure ([place]) 09/25/2017   Thrombocytopenia ([place]) 03/20/2015   Type 2 diabetes mellitus with other specified complication ([place]) 25/42/7062   HTN (hypertension) 03/20/2015   S/P CABG (coronary artery bypass  graft) 03/20/2015    Past Surgical History:  Procedure Laterality Date   [place] GRAFT  07/06/2017   Redo-sternotomy and CABG 2 with LIMA to LAD, SVG to OM, and a a closure, mitral valve repair with #30 mm [place] ring on 07/06/2017   ESOPHAGEAL MANOMETRY N/A 03/06/2012   Procedure: ESOPHAGEAL MANOMETRY (EM);  Surgeon: Beryle Beams, MD;  Location: [facility];  Service: Endoscopy;  Laterality: N/A;   IR ANGIOGRAM SELECTIVE EACH ADDITIONAL VESSEL  02/16/2019   IR ANGIOGRAM SELECTIVE EACH ADDITIONAL VESSEL  02/16/2019   IR ANGIOGRAM SELECTIVE EACH ADDITIONAL VESSEL  02/16/2019   IR ANGIOGRAM SELECTIVE EACH ADDITIONAL VESSEL  02/16/2019   IR RENAL SELECTIVE  UNI INC S&I MOD SED  02/16/2019   IR US GUIDE VASC ACCESS RIGHT  02/16/2019   RIGHT/LEFT HEART CATH AND CORONARY/GRAFT ANGIOGRAPHY N/A 08/07/2019   Procedure: RIGHT/LEFT HEART CATH AND CORONARY/GRAFT ANGIOGRAPHY;  Surgeon: Adrian Prows, MD;  Location: [facility];  Service: Cardiovascular;  Laterality: N/A;   TONSILLECTOMY      OB History   No obstetric history on file.      Home Medications    Prior to Admission medications   Medication Sig Start Date End Date Taking? Authorizing Provider  acetaminophen (TYLENOL) 500 MG tablet Take 500 mg by mouth at bedtime as needed for headache (pain).    [provider]  albuterol (VENTOLIN HFA) 108 (90 Base) MCG/ACT inhaler INHALE 2 PUFFS  INTO THE LUNGS EVERY 6 HOURS AS NEEDED FOR WHEEZE 09/21/19   Fulp, Cammie, MD  amLODipine (NORVASC) 10 MG tablet Take 1 tablet (10 mg total) by mouth daily. To lower blood pressure 09/21/19   Fulp, Cammie, MD  apixaban (ELIQUIS) 5 MG TABS tablet Take 1 tablet (5 mg total) by mouth 2 (two) times daily. 09/21/19   Fulp, Cammie, MD  arformoterol (BROVANA) 15 MCG/2ML NEBU Take 2 mLs (15 mcg total) by nebulization 2 (two) times daily. 09/21/19   Fulp, Cammie, MD  atorvastatin (LIPITOR) 40 MG  tablet Take 1 tablet (40 mg total) by mouth daily. 09/21/19   Fulp, Cammie, MD  bisoprolol (ZEBETA) 10 MG tablet TAKE 1 TABLET BY MOUTH EVERY DAY 09/25/19   Adrian Prows, MD  Blood Glucose Monitoring Suppl (ACCU-CHEK AVIVA PLUS) w/Device KIT USE TO CHECK BLOOD SUGARS ONCE DAILY 10/10/18   [provider]  budesonide (PULMICORT) 0.25 MG/2ML nebulizer solution Take 2 mLs (0.25 mg total) by nebulization 2 (two) times daily. 09/21/19   Fulp, Cammie, MD  busPIRone (BUSPAR) 5 MG tablet Take 1 tablet (5 mg total) by mouth 3 (three) times daily. 09/21/19   Fulp, Cammie, MD  clotrimazole (CLOTRIMAZOLE ANTI-FUNGAL) 1 % cream Apply 1 application topically 2 (two) times daily. 09/28/19   Zigmund Gottron, NP  dextromethorphan-guaiFENesin (MUCINEX DM) 30-600 MG 12hr tablet Take 1 tablet by mouth 2 (two) times daily. 09/03/19   Raylene Everts, MD  doxycycline (VIBRA-TABS) 100 MG tablet Take 1 tablet (100 mg total) by mouth 2 (two) times daily. 09/21/19   Fulp, Cammie, MD  escitalopram (LEXAPRO) 10 MG tablet Take 1 tablet (10 mg total) by mouth daily. 09/21/19   Fulp, Cammie, MD  famotidine (PEPCID) 20 MG tablet Take 1 tablet (20 mg total) by mouth 2 (two) times daily. To decrease stomach acid 09/21/19   Fulp, Cammie, MD  fluconazole (DIFLUCAN) 150 MG tablet Take 1 tablet today. If still with symptoms may repeat in 3 days. 09/28/19   Zigmund Gottron, NP  furosemide (LASIX) 20 MG tablet Take 1 tablet (20 mg total) by mouth 2 (two) times daily. 09/24/19   Fulp, Cammie, MD  glucose blood (ACCU-CHEK AVIVA PLUS) test strip USE WITH GLUCOMETER TO CHECK BLOOD SUGARS ONCE DAILY 10/10/18   [provider]  losartan (COZAAR) 50 MG tablet Take 1 tablet (50 mg total) by mouth daily. 09/21/19   Fulp, Cammie, MD  macitentan (OPSUMIT) 10 MG tablet Take 1 tablet (10 mg total) by mouth daily. 08/23/19   Adrian Prows, MD  metFORMIN (GLUCOPHAGE) 500 MG tablet Take 1 tablet (500 mg total) by mouth 2 (two) times daily with a meal.  09/21/19   Fulp, Cammie, MD  nystatin (MYCOSTATIN) 100000 UNIT/ML suspension Take 5 mLs (500,000 Units total) by mouth 4 (four) times daily. 09/11/19   Freida Busman, MD  predniSONE (DELTASONE) 20 MG tablet Take 2 tablets (40 mg total) by mouth daily with breakfast. 09/12/19   Freida Busman, MD  predniSONE (DELTASONE) 20 MG tablet 3 pills today then 2 pills daily x 2 days then 1 pill daily for 4 days then resume home dose 09/21/19   Fulp, Cammie, MD  sildenafil (REVATIO) 20 MG tablet Take 1 tablet (20 mg total) by mouth 3 (three) times daily. 09/21/19   Antony Blackbird, MD    Family History Family History  Problem Relation Age of Onset   Asthma Mother    Alcohol abuse Father     Social History  Social History   Tobacco Use   Smoking status: Never Smoker   Smokeless tobacco: Never Used  Vaping Use   Vaping Use: Never used  Substance Use Topics   Alcohol use: No   Drug use: Never     Allergies   Contrast media [iodinated diagnostic agents], Adhesive [tape], and Penicillins   Review of Systems Review of Systems   Physical Exam Triage Vital Signs ED Triage Vitals [09/28/19 1350]  Enc Vitals Group     BP (!) 170/75     Pulse Rate 62     Resp (!) 22     Temp 99.4 F (37.4 C)     Temp Source Oral     SpO2 96 %     Weight      Height      Head Circumference      Peak Flow      Pain Score      Pain Loc      Pain Edu?      Excl. in [place]?    No data found.  Updated Vital Signs BP (!) 170/75 (BP Location: Right Arm)    Pulse 62    Temp 99.4 F (37.4 C) (Oral)    Resp (!) 22    SpO2 96%   Visual Acuity Right Eye Distance:   Left Eye Distance:   Bilateral Distance:    Right Eye Near:   Left Eye Near:    Bilateral Near:     Physical Exam Constitutional:      General: She is not in acute distress.    Appearance: She is well-developed.  Cardiovascular:     Rate and Rhythm: Normal rate.  Pulmonary:     Effort: Pulmonary effort is normal.  Skin:    General:  Skin is warm and dry.     Comments: Fungal rash noted to bilateral groin, red, raised defined edges  Neurological:     Mental Status: She is alert and oriented to person, place, and time.      UC Treatments / Results  Labs (all labs ordered are listed, but only abnormal results are displayed) Labs Reviewed - No data to display  EKG   Radiology No results found.  Procedures Procedures (including critical care time)  Medications Ordered in UC Medications - No data to display  Initial Impression / Assessment and Plan / UC Course  I have reviewed the triage vital signs and the nursing notes.  Pertinent labs & imaging results that were available during my care of the patient were reviewed by me and considered in my medical decision making (see chart for details).     Tinea. Skin care discussed. Patient verbalized understanding and agreeable to plan.   Final Clinical Impressions(s) / UC Diagnoses   Final diagnoses:  Tinea cruris     Discharge Instructions     Keep the area dry best as you can.  No longer need to apply hydrogen peroxide. Use of the provided cream twice a day as needed.  Take one pill today and another in three days.  Follow up with your primary care provider if symptoms persist.    ED Prescriptions    Medication Sig Dispense Auth. Provider   clotrimazole (CLOTRIMAZOLE ANTI-FUNGAL) 1 % cream Apply 1 application topically 2 (two) times daily. 30 g Augusto Gamble B, NP   fluconazole (DIFLUCAN) 150 MG tablet Take 1 tablet today. If still with symptoms may repeat in 3 days. 2 tablet Zigmund Gottron,  NP     PDMP not reviewed this encounter.   Zigmund Gottron, NP 09/28/19 1434

## 2019-10-03 ENCOUNTER — Ambulatory Visit (INDEPENDENT_AMBULATORY_CARE_PROVIDER_SITE_OTHER): Payer: Medicare Other

## 2019-10-03 ENCOUNTER — Encounter: Payer: Self-pay | Admitting: Pulmonary Disease

## 2019-10-03 ENCOUNTER — Ambulatory Visit (INDEPENDENT_AMBULATORY_CARE_PROVIDER_SITE_OTHER): Payer: Medicare Other | Admitting: Pulmonary Disease

## 2019-10-03 ENCOUNTER — Other Ambulatory Visit: Payer: Self-pay

## 2019-10-03 VITALS — HR 66 | Temp 97.3°F | Ht 63.0 in | Wt 146.0 lb

## 2019-10-03 DIAGNOSIS — J189 Pneumonia, unspecified organism: Secondary | ICD-10-CM | POA: Diagnosis not present

## 2019-10-03 DIAGNOSIS — E079 Disorder of thyroid, unspecified: Secondary | ICD-10-CM | POA: Diagnosis not present

## 2019-10-03 DIAGNOSIS — J4551 Severe persistent asthma with (acute) exacerbation: Secondary | ICD-10-CM | POA: Diagnosis not present

## 2019-10-03 DIAGNOSIS — Z23 Encounter for immunization: Secondary | ICD-10-CM

## 2019-10-03 DIAGNOSIS — Z7189 Other specified counseling: Secondary | ICD-10-CM

## 2019-10-03 DIAGNOSIS — G4734 Idiopathic sleep related nonobstructive alveolar hypoventilation: Secondary | ICD-10-CM

## 2019-10-03 DIAGNOSIS — J309 Allergic rhinitis, unspecified: Secondary | ICD-10-CM

## 2019-10-03 DIAGNOSIS — Z5181 Encounter for therapeutic drug level monitoring: Secondary | ICD-10-CM | POA: Insufficient documentation

## 2019-10-03 NOTE — Patient Instructions (Addendum)
You were seen today by Lauraine Rinne, NP  for:   1. Severe persistent asthma   - Ambulatory referral to ENT  Continue [place] nebulized meds  Continue budesonide nebulized meds  We will refer you back to ear nose and throat for a follow-up as well as a baseline hearing exam  If your hearing exam is normal we can consider starting Monday Wednesday Friday azithromycin as this may be helpful with your congestion  High-dose flu vaccine today  2. Community acquired pneumonia, unspecified laterality  - DG Chest 2 View; Future  Chest x-ray today  3. Thyroid mass  Please schedule an immediate follow-up with [facility] throat   4. Complex care coordination  As explained today, it is extremely important that you keep regular follow-up visits with all of your specialist as well as primary care.  Limiting urgent care use is important.  We need to be mindful of the amount of antibiotics that you were receiving.  We will send you back to you for an appointment with ear nose and throat.  I documentation shows that you no showed to the last office visit.  As I explained to you today it is extremely important that we do not know show in the future as they can discharge you from the practice and this will limit your access to health care which you need.  5. Therapeutic drug monitoring  - Ambulatory referral to ENT  We will refer you back to [facility] ear nose and throat for follow-up as well as to have a baseline hearing exam  It is important for [place] to have this baseline prior to starting Monday Wednesday Friday azithromycin  We recommend today:  Orders Placed This Encounter  Procedures  . DG Chest 2 View    Standing Status:   Future    Standing Expiration Date:   02/02/2020    Order Specific Question:   Reason for Exam (SYMPTOM  OR DIAGNOSIS REQUIRED)    Answer:   hx of cap    Order Specific Question:   Preferred imaging location?    Answer:   Internal    Order Specific  Question:   Radiology Contrast Protocol - do NOT remove file path    Answer:   [email or web address]  . Ambulatory referral to ENT    Referral Priority:   Routine    Referral Type:   Consultation    Referral Reason:   Specialty Services Required    Requested Specialty:   Otolaryngology    Number of Visits Requested:   1   Orders Placed This Encounter  Procedures  . DG Chest 2 View  . Ambulatory referral to ENT   No orders of the defined types were placed in this encounter.   Follow Up:    Return in about 2 months (around 12/03/2019), or if symptoms worsen or fail to improve, for Follow up with Dr. Valeta Harms.    Notification of test results are managed in the following manner: If there are  any recommendations or changes to the  plan of care discussed in office today,  we will contact you and let you know what they are. If you do not hear from [place], then your results are normal and you can view them through your  MyChart account , or a letter will be sent to you. Thank you again for trusting [place] with your care  - Thank you, [facility]    It is flu season:   >>>  Best ways to protect herself from the flu: Receive the yearly flu vaccine, practice good hand hygiene washing with soap and also using hand sanitizer when available, eat a nutritious meals, get adequate rest, hydrate appropriately       Please contact the office if your symptoms worsen or you have concerns that you are not improving.   Thank you for choosing [facility] for your healthcare, and for allowing [place] to partner with you on your healthcare journey. I am thankful to be able to provide care to you today.   Wyn Quaker FNP-C   Influenza Virus Vaccine injection What is this medicine? INFLUENZA VIRUS VACCINE (in floo EN zuh VAHY ruhs vak SEEN) helps to reduce the risk of getting influenza also known as the flu. The vaccine only helps protect you against  some strains of the flu. This medicine may be used for other purposes; ask your health care provider or pharmacist if you have questions. COMMON BRAND NAME(S): Afluria, Afluria Quadrivalent, Agriflu, Alfuria, FLUAD, Fluarix, Fluarix Quadrivalent, Flublok, Flublok Quadrivalent, FLUCELVAX, FLUCELVAX Quadrivalent, Flulaval, Flulaval Quadrivalent, Fluvirin, Fluzone, Fluzone High-Dose, Fluzone Intradermal, Fluzone Quadrivalent What should I tell my health care provider before I take this medicine? They need to know if you have any of these conditions:  bleeding disorder like hemophilia  fever or infection  Guillain-Barre syndrome or other neurological problems  immune system problems  infection with the human immunodeficiency virus (HIV) or AIDS  low blood platelet counts  multiple sclerosis  an unusual or allergic reaction to influenza virus vaccine, latex, other medicines, foods, dyes, or preservatives. Different brands of vaccines contain different allergens. Some may contain latex or eggs. Talk to your doctor about your allergies to make sure that you get the right vaccine.  pregnant or trying to get pregnant  breast-feeding How should I use this medicine? This vaccine is for injection into a muscle or under the skin. It is given by a health care professional. A copy of Vaccine Information Statements will be given before each vaccination. Read this sheet carefully each time. The sheet may change frequently. Talk to your healthcare provider to see which vaccines are right for you. Some vaccines should not be used in all age groups. Overdosage: If you think you have taken too much of this medicine contact a poison control center or emergency room at once. NOTE: This medicine is only for you. Do not share this medicine with others. What if I miss a dose? This does not apply. What may interact with this medicine?  chemotherapy or radiation therapy  medicines that lower your immune  system like etanercept, anakinra, infliximab, and adalimumab  medicines that treat or prevent blood clots like warfarin  phenytoin  steroid medicines like prednisone or cortisone  theophylline  vaccines This list may not describe all possible interactions. Give your health care provider a list of all the medicines, herbs, non-prescription drugs, or dietary supplements you use. Also tell them if you smoke, drink alcohol, or use illegal drugs. Some items may interact with your medicine. What should I watch for while using this medicine? Report any side effects that do not go away within 3 days to your doctor or health care professional. Call your health care provider if any unusual symptoms occur within 6 weeks of receiving this vaccine. You may still catch the flu, but the illness is not usually as bad. You cannot get the flu from the vaccine. The vaccine will not protect against colds or other  illnesses that may cause fever. The vaccine is needed every year. What side effects may I notice from receiving this medicine? Side effects that you should report to your doctor or health care professional as soon as possible:  allergic reactions like skin rash, itching or hives, swelling of the face, lips, or tongue Side effects that usually do not require medical attention (report to your doctor or health care professional if they continue or are bothersome):  fever  headache  muscle aches and pains  pain, tenderness, redness, or swelling at the injection site  tiredness This list may not describe all possible side effects. Call your doctor for medical advice about side effects. You may report side effects to FDA at 1-800-FDA-1088. Where should I keep my medicine? The vaccine will be given by a health care professional in a clinic, pharmacy, doctor's office, or other health care setting. You will not be given vaccine doses to store at home. NOTE: This sheet is a summary. It may not cover all  possible information. If you have questions about this medicine, talk to your doctor, pharmacist, or health care provider.  2020 Elsevier/Gold Standard (2017-11-22 08:45:43)

## 2019-10-03 NOTE — Progress Notes (Signed)
_0  ID: Sharon Spears, female    DOB: 1945-04-01, 74 y.o.   MRN: [id number]  Chief Complaint  Patient presents with  . Follow-up    reports no improvement since hospital discharge last month    Referring provider: Gildardo Pounds, NP  HPI:  74 year old female never smoker but with prolonged smoke exposure living in [place] for over 50 years referred to our office on 01/26/2018 for prolonged dyspnea.  Also managed for asthma.  PMH: Congestive heart failure Smoker/ Smoking History: Never smoker Maintenance: Brovana, Pulmicort Pt of: Dr. Valeta Harms  10/03/2019  - Visit   74 year old female never smoker followed in our office for asthma.  She is completing follow-up after last being seen in April/2021 with Dr. Valeta Harms.  At that time it was discussed with the patient that she may benefit from being on Monday Wednesday Friday azithromycin.  Patient declined as she had concerns over the ototoxicity risk.  Patient unfortunately was hospitalized in August/2021 for community-acquired pneumonia.  She was treated with antibiotics.  She unfortunately then had to present back to an urgent care on 09/23/2019 where she was treated with a Z-Pak.  Patient then presented back again to another urgent care on 09/28/2019 where she was prescribed antifungals.  Patient is concerned and frustrated today that her breathing is not improving.  She continues to report increased amounts of clear congestion.  She continues to have allergic rhinitis and sinus congestion.  She is established with an ear nose and throat doctor but no showed to the last office visit per the documentation in care everywhere.  Patient reports that she no showed because "she did not put me on thyroid medication".  We will discuss this today.  Patient has been instructed to do nasal saline rinses 2 times daily.  She is currently doing this 1 time a day at night.  She feels that it is helpful.  She feels that she gets a significant amount  of congestion out.  She is using Flonase 2 times a day.  She still finds clinical benefit from using Brovana and Pulmicort nebulized medications.  Patient continues to have follow-up with cardiology.  She reports that when she was hospitalized she was started on nocturnal oxygen.  She is currently using 2 L at night.  She reports that she uses this when she "feels that she needs it and is struggling with breathing".  She does not use nocturnal oxygen consistently every evening.  We will discuss and evaluate this today.   Questionaires / Pulmonary Flowsheets:   ACT:  Asthma Control Test ACT Total Score  01/24/2019 11    MMRC: No flowsheet data found.  Epworth:  No flowsheet data found.  Tests:    02/08/2018-pulmonary function test - FVC 1.46 (58% predicted), postbronchodilator ratio 82, postbronchodilator FEV1 1.19 (63% predicted), no significant bronchodilator response, slight mid flow reversibility after administration of bronchodilators, DLCO is 67 which corrects to 79 based off of hemoglobin.  06/12/2018-IgE-49 06/12/2018-CBC with differential-eosinophils relative 0.6, eosinophils absolute 0  11/23/2018-CT chest high-res-there are areas of parenchymal banding throughout the mid to lower lungs bilaterally which favored to reflect chronic postinfectious or inflammatory scarring, no imaging findings to clearly indicate interstitial lung disease, small left pleural effusion, hemorrhagic lesion in the upper pole of the left kidney, presumably a Angio myolipoma based on comparison with remote prior CT the abdomen pelvis from 07/16/2011.  Consultation with urology for further work-up and management is recommended.  Dilatation of the pulmonic  trunk which may suggest pulmonary arterial hypertension, 2 mm nonobstructive calculus in the upper pole collecting system of left kidney, aortic arthrosclerosis, 3.2 x 2.9 cm intermediate attenuation lesion in the left lobe of the thyroid gland is indeterminate,  further evaluation with nonemergent thyroid ultrasound is recommended in the near future to better evaluate this lesion and determine potential need for fine-needle aspiration  07/20/2018-echocardiogram-LV has mildly reduced systolic function, EF of 45 to 50%, cavity size normal, right ventricle has normal systolic function, no increase in right ventricular wall thickness, left atrial size was mildly dilated, right atrial size was mildly dilated, moderate thickening of mitral valve leaflet, moderate calcification of the mitral valve leaflet    FENO:  No results found for: NITRICOXIDE  PFT: PFT Results Latest Ref Rng & Units 02/08/2018  FVC-Pre L 1.46  FVC-Predicted Pre % 58  FVC-Post L 1.46  FVC-Predicted Post % 58  Pre FEV1/FVC % % 80  Post FEV1/FCV % % 82  FEV1-Pre L 1.17  FEV1-Predicted Pre % 62  FEV1-Post L 1.19  DLCO uncorrected ml/min/mmHg 12.69  DLCO UNC% % 67  DLCO corrected ml/min/mmHg 15.00  DLCO COR %Predicted % 79  DLVA Predicted % 112  TLC L 3.41  TLC % Predicted % 76  RV % Predicted % 92    WALK:  SIX MIN WALK 08/23/2019  Medications None  Supplimental Oxygen during Test? (L/min) Yes  Laps 6  Partial Lap (in Meters) 0  Baseline Heartrate 54  Baseline SPO2 97  2 Minute Oxygen Saturation % 94  2 Minute HR 76  Heartrate 75  SPO2 94  Stopped or Paused before Six Minutes Yes  Other Symptoms at end of Exercise Per patient, she was getting dizzy, confused and severly SOB, said she needed to sit down  Interpretation Dizziness;Leg pain  Distance Completed 120  Tech Comments: Rest period - 3:30 - 3:00 min, Rest period 1:30- end of walk    Imaging: DG Chest Portable 1 View  Result Date: 09/08/2019 CLINICAL DATA:  Chest pain and SOB. EXAM: PORTABLE CHEST 1 VIEW COMPARISON:  09/03/2019. FINDINGS: Cardiac enlargement is stable. Previous median sternotomy and CABG. There is been interval development of airspace opacity within the right mid to lower lung concerning for  pneumonia. Small left pleural effusion, unchanged. Linear areas of scarring are noted in the left midlung and left lower lobe. IMPRESSION: 1. New airspace opacity within the right mid to lower lung concerning for pneumonia. 2. Stable small left pleural effusion. Electronically Signed   By: Kerby Moors M.D.   On: 09/08/2019 10:40    Lab Results:  CBC    Component Value Date/Time   WBC 10.7 (H) 09/11/2019 0414   RBC 4.65 09/11/2019 0414   HGB 11.6 (L) 09/11/2019 0414   HGB 13.0 06/05/2019 1033   HCT 37.7 09/11/2019 0414   HCT 40.8 06/05/2019 1033   PLT 187 09/11/2019 0414   PLT 159 06/05/2019 1033   MCV 81.1 09/11/2019 0414   MCV 78 (L) 06/05/2019 1033   MCH 24.9 (L) 09/11/2019 0414   MCHC 30.8 09/11/2019 0414   RDW 15.5 09/11/2019 0414   RDW 17.0 (H) 06/05/2019 1033   LYMPHSABS 1.4 09/11/2019 0414   MONOABS 0.8 09/11/2019 0414   EOSABS 0.0 09/11/2019 0414   BASOSABS 0.0 09/11/2019 0414    BMET    Component Value Date/Time   NA 136 09/11/2019 0414   NA 141 06/05/2019 1033   K 4.0 09/11/2019 0414   CL 98  09/11/2019 0414   CO2 28 09/11/2019 0414   GLUCOSE 172 (H) 09/11/2019 0414   BUN 22 09/11/2019 0414   BUN 23 06/05/2019 1033   CREATININE 0.93 09/11/2019 0414   CREATININE 0.79 07/30/2019 0820   CALCIUM 9.0 09/11/2019 0414   GFRNONAA >60 09/11/2019 0414   GFRNONAA 75 07/30/2019 0820   GFRAA >60 09/11/2019 0414   GFRAA 87 07/30/2019 0820    BNP    Component Value Date/Time   BNP 614.3 (H) 09/08/2019 1144    ProBNP No results found for: PROBNP  Specialty Problems      Pulmonary Problems   Cough   Dyspnea on exertion   Severe persistent asthma   CAP (community acquired pneumonia)   Dyspnea   Allergic rhinitis   Nocturnal hypoxia      Allergies  Allergen Reactions  . Contrast Media [Iodinated Diagnostic Agents] Anaphylaxis    Per pt she had anaphylaxis reaction to contrast media in the past. States she couldn't breathe and they had to give her epi.    . Adhesive [Tape] Itching  . Penicillins Other (See Comments)    GI upset - reaction to capsules - not injection Has patient had a PCN reaction causing immediate rash, facial/tongue/throat swelling, SOB or lightheadedness with hypotension: No Has patient had a PCN reaction causing severe rash involving mucus membranes or skin necrosis: No Has patient had a PCN reaction that required hospitalization: No Has patient had a PCN reaction occurring within the last 10 years: No If all of the above answers are "NO", then may proceed with Cephalosporin use.    Immunization History  Administered Date(s) Administered  . Influenza, High Dose Seasonal PF 09/24/2018  . Influenza,inj,Quad PF,6+ Mos 11/21/2015, 10/03/2019  . PFIZER SARS-COV-2 Vaccination 03/08/2019, 04/03/2019  . PPD Test 05/20/2014    Past Medical History:  Diagnosis Date  . Adjustment disorder with anxiety   . Asthma   . Atrial fibrillation ([place])   . Benign essential hypertension   . COPD (chronic obstructive pulmonary disease) ([place])   . Coronary artery disease   . Diabetic neuropathic arthritis ([place])   . Heart palpitations   . Hypoxia 07/19/2018  . Other cirrhosis of liver ([place]) 07/20/2018  . Uncontrolled diabetes mellitus type 2 without complications     Tobacco History: Social History   Tobacco Use  Smoking Status Never Smoker  Smokeless Tobacco Never Used   Counseling given: Yes   Continue to not smoke  Outpatient Encounter Medications as of 10/03/2019  Medication Sig  . acetaminophen (TYLENOL) 500 MG tablet Take 500 mg by mouth at bedtime as needed for headache (pain).  Marland Kitchen albuterol (VENTOLIN HFA) 108 (90 Base) MCG/ACT inhaler INHALE 2 PUFFS INTO THE LUNGS EVERY 6 HOURS AS NEEDED FOR WHEEZE  . amLODipine (NORVASC) 10 MG tablet Take 1 tablet (10 mg total) by mouth daily. To lower blood pressure  . apixaban (ELIQUIS) 5 MG TABS tablet Take 1 tablet (5 mg total) by mouth 2 (two) times daily.  Marland Kitchen arformoterol  (BROVANA) 15 MCG/2ML NEBU Take 2 mLs (15 mcg total) by nebulization 2 (two) times daily.  Marland Kitchen atorvastatin (LIPITOR) 40 MG tablet Take 1 tablet (40 mg total) by mouth daily.  . bisoprolol (ZEBETA) 10 MG tablet TAKE 1 TABLET BY MOUTH EVERY DAY  . Blood Glucose Monitoring Suppl (ACCU-CHEK AVIVA PLUS) w/Device KIT USE TO CHECK BLOOD SUGARS ONCE DAILY  . budesonide (PULMICORT) 0.25 MG/2ML nebulizer solution Take 2 mLs (0.25 mg total) by nebulization 2 (two)  times daily.  . clotrimazole (CLOTRIMAZOLE ANTI-FUNGAL) 1 % cream Apply 1 application topically 2 (two) times daily.  Marland Kitchen dextromethorphan-guaiFENesin (MUCINEX DM) 30-600 MG 12hr tablet Take 1 tablet by mouth 2 (two) times daily.  Marland Kitchen doxycycline (VIBRA-TABS) 100 MG tablet Take 1 tablet (100 mg total) by mouth 2 (two) times daily.  Marland Kitchen escitalopram (LEXAPRO) 10 MG tablet Take 1 tablet (10 mg total) by mouth daily.  . famotidine (PEPCID) 20 MG tablet Take 1 tablet (20 mg total) by mouth 2 (two) times daily. To decrease stomach acid  . fluconazole (DIFLUCAN) 150 MG tablet Take 1 tablet today. If still with symptoms may repeat in 3 days.  . furosemide (LASIX) 20 MG tablet Take 1 tablet (20 mg total) by mouth 2 (two) times daily.  Marland Kitchen glucose blood (ACCU-CHEK AVIVA PLUS) test strip USE WITH GLUCOMETER TO CHECK BLOOD SUGARS ONCE DAILY  . losartan (COZAAR) 50 MG tablet Take 1 tablet (50 mg total) by mouth daily.  . macitentan (OPSUMIT) 10 MG tablet Take 1 tablet (10 mg total) by mouth daily.  . metFORMIN (GLUCOPHAGE) 500 MG tablet Take 1 tablet (500 mg total) by mouth 2 (two) times daily with a meal.  . nystatin (MYCOSTATIN) 100000 UNIT/ML suspension Take 5 mLs (500,000 Units total) by mouth 4 (four) times daily.  . predniSONE (DELTASONE) 20 MG tablet Take 2 tablets (40 mg total) by mouth daily with breakfast.  . predniSONE (DELTASONE) 20 MG tablet 3 pills today then 2 pills daily x 2 days then 1 pill daily for 4 days then resume home dose  . sildenafil (REVATIO)  20 MG tablet Take 1 tablet (20 mg total) by mouth 3 (three) times daily.  . busPIRone (BUSPAR) 5 MG tablet Take 1 tablet (5 mg total) by mouth 3 (three) times daily.   No facility-administered encounter medications on file as of 10/03/2019.     Review of Systems  Review of Systems  Constitutional: Positive for fatigue. Negative for activity change and fever.  HENT: Positive for congestion, postnasal drip and rhinorrhea. Negative for sinus pressure, sinus pain and sore throat.   Respiratory: Positive for cough. Negative for shortness of breath and wheezing.   Cardiovascular: Negative for chest pain and palpitations.  Gastrointestinal: Negative for diarrhea, nausea and vomiting.  Musculoskeletal: Negative for arthralgias.  Neurological: Negative for dizziness.  Psychiatric/Behavioral: Negative for sleep disturbance. The patient is not nervous/anxious.      Physical Exam  Pulse 66   Temp (!) 97.3 F (36.3 C)   Ht _0  (1.6 m)   Wt 146 lb (66.2 kg)   SpO2 97%   BMI 25.86 kg/m   Wt Readings from Last 5 Encounters:  10/03/19 146 lb (66.2 kg)  09/21/19 146 lb 3.2 oz (66.3 kg)  09/10/19 151 lb 10.8 oz (68.8 kg)  09/03/19 151 lb (68.5 kg)  09/03/19 167 lb 8.8 oz (76 kg)    BMI Readings from Last 5 Encounters:  10/03/19 25.86 kg/m  09/21/19 25.90 kg/m  09/10/19 26.87 kg/m  09/03/19 26.75 kg/m  09/03/19 29.68 kg/m     Physical Exam Vitals and nursing note reviewed.  Constitutional:      General: She is not in acute distress.    Appearance: Normal appearance. She is normal weight.  HENT:     Head: Normocephalic and atraumatic.     Right Ear: Tympanic membrane, ear canal and external ear normal. There is no impacted cerumen.     Left Ear: Tympanic membrane, ear canal and  external ear normal. There is no impacted cerumen.     Nose: Congestion and rhinorrhea present.     Mouth/Throat:     Mouth: Mucous membranes are moist.     Pharynx: Oropharynx is clear.  Eyes:      Pupils: Pupils are equal, round, and reactive to light.  Cardiovascular:     Rate and Rhythm: Normal rate and regular rhythm.     Pulses: Normal pulses.     Heart sounds: Normal heart sounds. No murmur heard.   Pulmonary:     Effort: Pulmonary effort is normal. No respiratory distress.     Breath sounds: Normal breath sounds. No decreased air movement. No decreased breath sounds, wheezing or rales.  Musculoskeletal:     Cervical back: Normal range of motion.     Right lower leg: No edema.     Left lower leg: No edema.  Skin:    General: Skin is warm and dry.     Capillary Refill: Capillary refill takes less than 2 seconds.  Neurological:     General: No focal deficit present.     Mental Status: She is alert and oriented to person, place, and time. Mental status is at baseline.     Gait: Gait normal.  Psychiatric:        Mood and Affect: Mood is depressed. Affect is flat.        Behavior: Behavior normal.        Thought Content: Thought content normal.        Judgment: Judgment normal.       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  Patient continues to struggle with medication adherence, as well as follow-ups with specialist.  There is still continues to be a significant amount of antibiotic use in urgent care visits.  Plan: Continue Brovana Continue Pulmicort nebs Continue probiotic I believe patient may benefit from resuming Monday Wednesday Friday azithromycin We will send patient back to ear nose and throat for baseline hearing exam and to reestablish care If hearing exam is normal I would recommend that she resume Monday Wednesday Friday azithromycin 8-week follow-up with Dr. Valeta Harms  CAP (community acquired pneumonia) Status post inpatient treatment with antibiotics Status post outpatient urgent care treatment with Z-Pak  Plan: Chest x-ray today  Thyroid  mass Plan: Reestablish with ear nose and throat, do not no-show to this visit as it will put you at high risk of being discharged from their practice  Therapeutic drug monitoring Plan: Patient needs baseline hearing eval  Complex care coordination This continues to be an ongoing struggle for the patient.  Our office is work diligently working with patient, her family as well as multiple specialists.  It is extremely concerning the amount of antibiotics the patient continues to utilize.  Is also concerning that she no showed to her [facility] ear nose and throat office.  I have encouraged her to reestablish.  Keep follow-up with [facility] Dr[facility] Lovena Neighbours, keep follow-up with Ms. Raul Del and primary care.  Plan: Keep follow-up with our office in 8 weeks with Dr. Valeta Harms  Nocturnal hypoxia Patient has nocturnal oxygen at home She is not using this consistently  Plan: We will order a overnight oximetry test on room air to further evaluate if patient needs oxygen at night  Allergic rhinitis Plan: Increase nasal saline rinses to previously instructed twice daily use Use Flonase after nasal saline rinse  Reestablish care with ear nose and throat    Return in about 2 months (around 12/03/2019), or if symptoms worsen or fail to improve, for Follow up with Dr. Valeta Harms.   Lauraine Rinne, NP 10/03/2019   This appointment required 36 minutes of patient care (this includes precharting, chart review, review of results, face-to-face care, etc.).

## 2019-10-03 NOTE — Assessment & Plan Note (Signed)
Patient has nocturnal oxygen at home She is not using this consistently  Plan: We will order a overnight oximetry test on room air to further evaluate if patient needs oxygen at night

## 2019-10-03 NOTE — Assessment & Plan Note (Signed)
Plan: Increase nasal saline rinses to previously instructed twice daily use Use Flonase after nasal saline rinse Reestablish care with ear nose and throat

## 2019-10-03 NOTE — Assessment & Plan Note (Signed)
Plan: Reestablish with ear nose and throat, do not no-show to this visit as it will put you at high risk of being discharged from their practice

## 2019-10-03 NOTE — Assessment & Plan Note (Signed)
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  Patient continues to struggle with medication adherence, as well as follow-ups with specialist.  There is still continues to be a significant amount of antibiotic use in urgent care visits.  Plan: Continue Brovana Continue Pulmicort nebs Continue probiotic I believe patient may benefit from resuming Monday Wednesday Friday azithromycin We will send patient back to ear nose and throat for baseline hearing exam and to reestablish care If hearing exam is normal I would recommend that she resume Monday Wednesday Friday azithromycin 8-week follow-up with Dr. Valeta Harms

## 2019-10-03 NOTE — Assessment & Plan Note (Signed)
This continues to be an ongoing struggle for the patient.  Our office is work diligently working with patient, her family as well as multiple specialists.  It is extremely concerning the amount of antibiotics the patient continues to utilize.  Is also concerning that she no showed to her [facility] ear nose and throat office.  I have encouraged her to reestablish.  Keep follow-up with [facility] Dr. Lovena Neighbours, keep follow-up with Ms. Raul Del and primary care.  Plan: Keep follow-up with our office in 8 weeks with Dr. Valeta Harms

## 2019-10-03 NOTE — Assessment & Plan Note (Signed)
Plan: Patient needs baseline hearing eval

## 2019-10-03 NOTE — Progress Notes (Signed)
Thanks for seeing her today.  [name], DO [place] [facility] 10/03/2019 3:32 PM

## 2019-10-03 NOTE — Assessment & Plan Note (Signed)
Status post inpatient treatment with antibiotics Status post outpatient urgent care treatment with Z-Pak  Plan: Chest x-ray today

## 2019-10-04 ENCOUNTER — Ambulatory Visit: Payer: Medicare Other | Admitting: Cardiology

## 2019-10-04 ENCOUNTER — Encounter: Payer: Self-pay | Admitting: Cardiology

## 2019-10-04 VITALS — BP 180/83 | HR 55 | Resp 17 | Ht 63.0 in | Wt 144.8 lb

## 2019-10-04 DIAGNOSIS — I10 Essential (primary) hypertension: Secondary | ICD-10-CM

## 2019-10-04 DIAGNOSIS — I272 Pulmonary hypertension, unspecified: Secondary | ICD-10-CM

## 2019-10-04 DIAGNOSIS — R0609 Other forms of dyspnea: Secondary | ICD-10-CM

## 2019-10-04 DIAGNOSIS — I25118 Atherosclerotic heart disease of native coronary artery with other forms of angina pectoris: Secondary | ICD-10-CM

## 2019-10-04 DIAGNOSIS — I4821 Permanent atrial fibrillation: Secondary | ICD-10-CM

## 2019-10-04 MED ORDER — AMLODIPINE BESYLATE 10 MG PO TABS
10.0000 mg | ORAL_TABLET | Freq: Every day | ORAL | 2 refills | Status: AC
Start: 2019-10-04 — End: ?
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
## 2019-10-06 NOTE — Telephone Encounter (Signed)
Trying Rx for opsumit

## 2019-10-16 ENCOUNTER — Other Ambulatory Visit: Payer: Self-pay | Admitting: Nurse Practitioner

## 2019-10-17 ENCOUNTER — Ambulatory Visit (HOSPITAL_COMMUNITY)
Admission: EM | Admit: 2019-10-17 | Discharge: 2019-10-17 | Disposition: A | Discharge status: Home / Self Care | Payer: Medicare Other | Attending: Internal Medicine | Admitting: Internal Medicine

## 2019-10-17 ENCOUNTER — Encounter (HOSPITAL_COMMUNITY): Payer: Self-pay | Admitting: Emergency Medicine

## 2019-10-17 ENCOUNTER — Other Ambulatory Visit: Payer: Self-pay

## 2019-10-17 ENCOUNTER — Inpatient Hospital Stay: Payer: Medicare Other | Admitting: Nurse Practitioner

## 2019-10-17 ENCOUNTER — Telehealth (HOSPITAL_COMMUNITY): Payer: Self-pay | Admitting: Emergency Medicine

## 2019-10-17 DIAGNOSIS — N3001 Acute cystitis with hematuria: Secondary | ICD-10-CM | POA: Diagnosis present

## 2019-10-17 LAB — POCT URINALYSIS DIPSTICK, ED / UC
Bilirubin Urine: NEGATIVE
Glucose, UA: 100 mg/dL — AB
Leukocytes,Ua: NEGATIVE
Nitrite: NEGATIVE
Protein, ur: 100 mg/dL — AB
Specific Gravity, Urine: 1.025 (ref 1.005–1.030)
Urobilinogen, UA: 2 mg/dL — ABNORMAL HIGH (ref 0.0–1.0)
pH: 6 (ref 5.0–8.0)

## 2019-10-17 MED ORDER — CEFDINIR 300 MG PO CAPS: 300.0000 mg | ORAL_CAPSULE | Freq: Two times a day (BID) | ORAL | 0 refills | Status: DC

## 2019-10-17 MED ORDER — CEFDINIR 300 MG PO CAPS: 300.0000 mg | ORAL_CAPSULE | Freq: Two times a day (BID) | ORAL | 0 refills | Status: AC

## 2019-10-17 NOTE — ED Provider Notes (Signed)
[facility]    CSN: [id number] Arrival date & time: 10/17/19  1547      History   Chief Complaint Chief Complaint  Patient presents with  . Urinary Tract Infection    HPI Sharon Spears is a 74 y.o. female comes to the urgent care with complaints of lower abdominal pain, foul-smelling urine with some urgency.  Patient has also noticed some blood in the urine.  Symptoms have been persistent for a week.  No flank pain, fever, nausea, vomiting.   HPI  Past Medical History:  Diagnosis Date  . Adjustment disorder with anxiety   . Asthma   . Atrial fibrillation ([place])   . Benign essential hypertension   . COPD (chronic obstructive pulmonary disease) ([place])   . Coronary artery disease   . Diabetic neuropathic arthritis ([place])   . Heart palpitations   . Hypoxia 07/19/2018  . Other cirrhosis of liver ([place]) 07/20/2018  . Uncontrolled diabetes mellitus type 2 without complications     Patient Active Problem List   Diagnosis Date Noted  . Therapeutic drug monitoring 10/03/2019  . Nocturnal hypoxia 10/03/2019  . Allergic rhinitis 10/03/2019  . Dyspnea 09/09/2019  . CAP (community acquired pneumonia) 09/08/2019  . Severe persistent asthma 02/14/2019  . Medication monitoring encounter 02/14/2019  . Episode of recurrent major depressive disorder ([place]) 01/24/2019  . Coronary artery disease of native artery of native heart with stable angina pectoris ([place]) 01/24/2019  . Thyroid mass 12/01/2018  . Renal mass 12/01/2018  . Complex care coordination 12/01/2018  . Other cirrhosis of liver ([place]) 07/20/2018  . Unspecified atrial fibrillation ([place]) 07/20/2018  . Cough 02/08/2018  . Dyspnea on exertion 02/08/2018  . Chronic systolic heart failure ([place]) 09/25/2017  . Thrombocytopenia ([place]) 03/20/2015  . Type 2 diabetes mellitus with other specified complication ([place]) 32/67/1245  . HTN (hypertension) 03/20/2015  . S/P CABG (coronary artery bypass graft) 03/20/2015    Past  Surgical History:  Procedure Laterality Date  . CARDIAC SURGERY    . CESAREAN SECTION    . CORONARY ARTERY BYPASS GRAFT  07/06/2017   Redo-sternotomy and CABG 2 with LIMA to LAD, SVG to OM, and a a closure, mitral valve repair with #30 mm [place] ring on 07/06/2017  . ESOPHAGEAL MANOMETRY N/A 03/06/2012   Procedure: ESOPHAGEAL MANOMETRY (EM);  Surgeon: Beryle Beams, MD;  Location: [facility];  Service: Endoscopy;  Laterality: N/A;  . IR ANGIOGRAM SELECTIVE EACH ADDITIONAL VESSEL  02/16/2019  . IR ANGIOGRAM SELECTIVE EACH ADDITIONAL VESSEL  02/16/2019  . IR ANGIOGRAM SELECTIVE EACH ADDITIONAL VESSEL  02/16/2019  . IR ANGIOGRAM SELECTIVE EACH ADDITIONAL VESSEL  02/16/2019  . IR RENAL SELECTIVE  UNI INC S&I MOD SED  02/16/2019  . IR US GUIDE VASC ACCESS RIGHT  02/16/2019  . RIGHT/LEFT HEART CATH AND CORONARY/GRAFT ANGIOGRAPHY N/A 08/07/2019   Procedure: RIGHT/LEFT HEART CATH AND CORONARY/GRAFT ANGIOGRAPHY;  Surgeon: Adrian Prows, MD;  Location: [facility];  Service: Cardiovascular;  Laterality: N/A;  . TONSILLECTOMY      OB History   No obstetric history on file.      Home Medications    Prior to Admission medications   Medication Sig Start Date End Date Taking? Authorizing Provider  acetaminophen (TYLENOL) 500 MG tablet Take 500 mg by mouth at bedtime as needed for headache (pain).    [provider]  albuterol (VENTOLIN HFA) 108 (90 Base) MCG/ACT inhaler INHALE 2 PUFFS INTO THE LUNGS EVERY 6 HOURS AS NEEDED  FOR WHEEZE 09/21/19   Fulp, Cammie, MD  amLODipine (NORVASC) 10 MG tablet Take 1 tablet (10 mg total) by mouth daily. 10/04/19   Adrian Prows, MD  apixaban (ELIQUIS) 5 MG TABS tablet Take 1 tablet (5 mg total) by mouth 2 (two) times daily. 09/21/19   Fulp, Cammie, MD  arformoterol (BROVANA) 15 MCG/2ML NEBU Take 2 mLs (15 mcg total) by nebulization 2 (two) times daily. 09/21/19   Fulp, Cammie, MD  atorvastatin (LIPITOR) 40 MG tablet Take 1 tablet (40 mg total) by mouth daily.  09/21/19   Fulp, Cammie, MD  bisoprolol (ZEBETA) 10 MG tablet TAKE 1 TABLET BY MOUTH EVERY DAY 09/25/19   Adrian Prows, MD  Blood Glucose Monitoring Suppl (ACCU-CHEK AVIVA PLUS) w/Device KIT USE TO CHECK BLOOD SUGARS ONCE DAILY 10/10/18   [provider]  budesonide (PULMICORT) 0.25 MG/2ML nebulizer solution Take 2 mLs (0.25 mg total) by nebulization 2 (two) times daily. 09/21/19   Fulp, Cammie, MD  busPIRone (BUSPAR) 5 MG tablet Take 1 tablet (5 mg total) by mouth 3 (three) times daily. 09/21/19   Fulp, Cammie, MD  cefdinir (OMNICEF) 300 MG capsule Take 1 capsule (300 mg total) by mouth 2 (two) times daily for 5 days. 10/17/19 10/22/19  Raylene Everts, MD  clotrimazole (CLOTRIMAZOLE ANTI-FUNGAL) 1 % cream Apply 1 application topically 2 (two) times daily. 09/28/19   Zigmund Gottron, NP  dextromethorphan-guaiFENesin (MUCINEX DM) 30-600 MG 12hr tablet Take 1 tablet by mouth 2 (two) times daily. 09/03/19   Raylene Everts, MD  doxycycline (VIBRA-TABS) 100 MG tablet Take 1 tablet (100 mg total) by mouth 2 (two) times daily. 09/21/19   Fulp, Cammie, MD  famotidine (PEPCID) 20 MG tablet Take 1 tablet (20 mg total) by mouth 2 (two) times daily. To decrease stomach acid 09/21/19   Fulp, Cammie, MD  furosemide (LASIX) 20 MG tablet Take 1 tablet (20 mg total) by mouth 2 (two) times daily. Patient taking differently: Take 20 mg by mouth 2 (two) times daily. 10 mg daily 09/24/19   Fulp, Cammie, MD  glucose blood (ACCU-CHEK AVIVA PLUS) test strip USE WITH GLUCOMETER TO CHECK BLOOD SUGARS ONCE DAILY 10/10/18   [provider]  losartan (COZAAR) 100 MG tablet Take 1 tablet (100 mg total) by mouth every evening. 10/04/19   Adrian Prows, MD  macitentan (OPSUMIT) 10 MG tablet Take 1 tablet (10 mg total) by mouth daily. Patient not taking: Reported on 10/04/2019 08/23/19   Adrian Prows, MD  metFORMIN (GLUCOPHAGE) 500 MG tablet Take 1 tablet (500 mg total) by mouth 2 (two) times daily with a meal. 09/21/19   Fulp,  Cammie, MD  montelukast (SINGULAIR) 10 MG tablet Take 10 mg by mouth daily. 09/29/19   [provider]  nystatin (MYCOSTATIN) 100000 UNIT/ML suspension Take 5 mLs (500,000 Units total) by mouth 4 (four) times daily. 09/11/19   Freida Busman, MD  predniSONE (DELTASONE) 20 MG tablet Take 2 tablets (40 mg total) by mouth daily with breakfast. 09/12/19   Freida Busman, MD  sildenafil (REVATIO) 20 MG tablet Take 1 tablet (20 mg total) by mouth 3 (three) times daily. 09/21/19   Antony Blackbird, MD    Family History Family History  Problem Relation Age of Onset  . Asthma Mother   . Alcohol abuse Father     Social History Social History   Tobacco Use  . Smoking status: Never Smoker  . Smokeless tobacco: Never Used  Vaping Use  . Vaping  Use: Never used  Substance Use Topics  . Alcohol use: No  . Drug use: Never     Allergies   Contrast media [iodinated diagnostic agents], Adhesive [tape], and Penicillins   Review of Systems Review of Systems  Constitutional: Negative for activity change.  Gastrointestinal: Positive for abdominal pain. Negative for diarrhea, nausea and vomiting.  Genitourinary: Positive for frequency, hematuria and urgency. Negative for dysuria, flank pain, pelvic pain and vaginal discharge.  Musculoskeletal: Negative.   Psychiatric/Behavioral: Negative for confusion.     Physical Exam Triage Vital Signs ED Triage Vitals  Enc Vitals Group     BP 10/17/19 1809 (!) 177/60     Pulse Rate 10/17/19 1809 (!) 53     Resp 10/17/19 1809 16     Temp 10/17/19 1809 98.4 F (36.9 C)     Temp Source 10/17/19 1809 Oral     SpO2 10/17/19 1809 96 %     Weight --      Height --      Head Circumference --      Peak Flow --      Pain Score 10/17/19 1808 6     Pain Loc --      Pain Edu? --      Excl. in [place]? --    No data found.  Updated Vital Signs BP (!) 177/60 (BP Location: Right Arm)   Pulse (!) 53   Temp 98.4 F (36.9 C) (Oral)   Resp 16   SpO2 96%    Visual Acuity Right Eye Distance:   Left Eye Distance:   Bilateral Distance:    Right Eye Near:   Left Eye Near:    Bilateral Near:     Physical Exam   UC Treatments / Results  Labs (all labs ordered are listed, but only abnormal results are displayed) Labs Reviewed  POCT URINALYSIS DIPSTICK, ED / UC - Abnormal; Notable for the following components:      Result Value   Glucose, UA 100 (*)    Ketones, ur TRACE (*)    Hgb urine dipstick MODERATE (*)    Protein, ur 100 (*)    Urobilinogen, UA 2.0 (*)    All other components within normal limits  URINE CULTURE    EKG   Radiology No results found.  Procedures Procedures (including critical care time)  Medications Ordered in UC Medications - No data to display  Initial Impression / Assessment and Plan / UC Course  I have reviewed the triage vital signs and the nursing notes.  Pertinent labs & imaging results that were available during my care of the patient were reviewed by me and considered in my medical decision making (see chart for details).     1.  Acute cystitis with hematuria: Urinalysis is positive for hemoglobin Urine culture sent Cefdinir 300 mg twice daily for 5 days We will follow the urine cultures and update antibiotics if needed. Final Clinical Impressions(s) / UC Diagnoses   Final diagnoses:  Acute cystitis with hematuria   Discharge Instructions   None    ED Prescriptions    Medication Sig Dispense Auth. Provider   cefdinir (OMNICEF) 300 MG capsule Take 1 capsule (300 mg total) by mouth 2 (two) times daily for 5 days. 10 capsule Mikko Lewellen, Myrene Galas, MD     PDMP not reviewed this encounter.   Chase Picket, MD 10/17/19 Lurena Nida

## 2019-10-17 NOTE — ED Triage Notes (Signed)
Pt presents with possible UTI. States urine has strong odor to it and lower back pain xs 1 week.

## 2019-10-18 LAB — URINE CULTURE: Culture: <10000 — AB

## 2019-10-29 ENCOUNTER — Ambulatory Visit: Payer: Medicare Other | Attending: Nurse Practitioner | Admitting: Nurse Practitioner

## 2019-10-29 ENCOUNTER — Other Ambulatory Visit: Payer: Self-pay

## 2019-10-29 ENCOUNTER — Encounter: Payer: Self-pay | Admitting: Nurse Practitioner

## 2019-10-29 NOTE — Progress Notes (Signed)
Ms. Sharon Spears up on the nurse today after becoming angry that she had a phone visit today. Despite the fact that she was agreeable to the phone visit on Friday.

## 2019-10-31 ENCOUNTER — Other Ambulatory Visit: Payer: Self-pay | Admitting: Family Medicine

## 2019-10-31 DIAGNOSIS — F411 Generalized anxiety disorder: Secondary | ICD-10-CM

## 2019-10-31 NOTE — Telephone Encounter (Signed)
   Notes to clinic:  requesting a 90 day with refills    Requested Prescriptions  Pending Prescriptions Disp Refills   busPIRone (BUSPAR) 5 MG tablet [Pharmacy Med Name: BUSPIRONE HCL 5 MG TABLET] 270 tablet 1    Sig: TAKE 1 TABLET BY MOUTH THREE TIMES A DAY      Psychiatry: Anxiolytics/Hypnotics - Non-controlled Passed - 10/31/2019 12:10 PM      Passed - Valid encounter within last 6 months    Recent Outpatient Visits           1 month ago Hospital discharge follow-up   [place] Fulp, Kendall, MD   4 months ago Severe persistent asthma with acute exacerbation   [place] Elsie Stain, MD   4 months ago Type 2 diabetes mellitus with hyperglycemia, without long-term current use of insulin [facility])   [place], Vernia Buff, NP   5 months ago Encounter to establish care   [place], Vernia Buff, NP       Future Appointments             In 2 weeks Adrian Prows, MD [facility]   In 1 month Gildardo Pounds, NP [place]

## 2019-11-15 ENCOUNTER — Ambulatory Visit: Payer: Medicare Other | Admitting: Cardiology

## 2019-11-16 ENCOUNTER — Telehealth: Payer: Self-pay

## 2019-11-16 NOTE — Telephone Encounter (Signed)
[place] has been trying to reach patient. The patient does not return calls. Patients daughter stated that she moved out of [place]. [facility] is cancelling her [facility]. Pt did not show for her appt with you yesterday

## 2019-12-10 ENCOUNTER — Ambulatory Visit: Payer: Medicare Other | Admitting: Nurse Practitioner

## 2020-01-29 ENCOUNTER — Ambulatory Visit: Payer: Medicare Other | Admitting: Nurse Practitioner
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
## 2020-09-13 ENCOUNTER — Telehealth: Payer: Self-pay

## 2020-09-13 NOTE — Telephone Encounter (Signed)
LVM for pt to call office to schedule AWV.  Please schedule on [facility] [employer] schedule.  60 min appt
